# Patient Record
Sex: Female | Born: 1935 | Race: White | Hispanic: No | Marital: Single | Smoking: Former smoker
Health system: Southern US, Community
[De-identification: ages and names within clinical notes are randomized; demographics above are authoritative.]

## PROBLEM LIST (undated history)

## (undated) DIAGNOSIS — G2581 Restless legs syndrome: Secondary | ICD-10-CM

## (undated) DIAGNOSIS — E119 Type 2 diabetes mellitus without complications: Secondary | ICD-10-CM

## (undated) DIAGNOSIS — D509 Iron deficiency anemia, unspecified: Secondary | ICD-10-CM

## (undated) DIAGNOSIS — I10 Essential (primary) hypertension: Secondary | ICD-10-CM

## (undated) DIAGNOSIS — G4700 Insomnia, unspecified: Secondary | ICD-10-CM

## (undated) DIAGNOSIS — I509 Heart failure, unspecified: Secondary | ICD-10-CM

## (undated) DIAGNOSIS — K219 Gastro-esophageal reflux disease without esophagitis: Secondary | ICD-10-CM

## (undated) HISTORY — DX: Iron deficiency anemia, unspecified: D50.9

## (undated) HISTORY — DX: Insomnia, unspecified: G47.00

## (undated) HISTORY — DX: Heart failure, unspecified: I50.9

## (undated) HISTORY — PX: BLADDER REPAIR: SHX76

## (undated) HISTORY — DX: Restless legs syndrome: G25.81

---

## 2006-01-29 ENCOUNTER — Ambulatory Visit: Payer: Self-pay | Admitting: Internal Medicine

## 2007-02-11 ENCOUNTER — Ambulatory Visit: Payer: Self-pay | Admitting: Internal Medicine

## 2008-02-15 ENCOUNTER — Ambulatory Visit: Payer: Self-pay | Admitting: Internal Medicine

## 2009-02-20 ENCOUNTER — Ambulatory Visit: Payer: Self-pay | Admitting: Internal Medicine

## 2010-03-04 ENCOUNTER — Ambulatory Visit: Payer: Self-pay | Admitting: Internal Medicine

## 2011-09-15 ENCOUNTER — Ambulatory Visit: Payer: Self-pay | Admitting: Family Medicine

## 2014-06-23 ENCOUNTER — Emergency Department: Payer: Self-pay | Admitting: Emergency Medicine

## 2014-06-24 LAB — URINALYSIS, COMPLETE
Bacteria: NONE SEEN
Bilirubin,UR: NEGATIVE
Blood: NEGATIVE
Glucose,UR: NEGATIVE mg/dL (ref 0–75)
Ketone: NEGATIVE
Nitrite: NEGATIVE
Ph: 5 (ref 4.5–8.0)
Protein: 30
RBC,UR: 16 /HPF (ref 0–5)
Specific Gravity: 1.015 (ref 1.003–1.030)
Squamous Epithelial: 9
WBC UR: 150 /HPF (ref 0–5)

## 2014-06-26 LAB — URINE CULTURE

## 2015-02-16 ENCOUNTER — Encounter: Payer: Self-pay | Admitting: Emergency Medicine

## 2015-02-16 ENCOUNTER — Inpatient Hospital Stay
Admission: EM | Admit: 2015-02-16 | Discharge: 2015-02-17 | DRG: 812 | Disposition: A | Discharge status: Home / Self Care | Payer: Medicare PPO | Attending: Internal Medicine | Admitting: Internal Medicine

## 2015-02-16 DIAGNOSIS — D509 Iron deficiency anemia, unspecified: Secondary | ICD-10-CM | POA: Diagnosis present

## 2015-02-16 DIAGNOSIS — R0902 Hypoxemia: Secondary | ICD-10-CM | POA: Diagnosis present

## 2015-02-16 DIAGNOSIS — Z6830 Body mass index (BMI) 30.0-30.9, adult: Secondary | ICD-10-CM | POA: Diagnosis not present

## 2015-02-16 DIAGNOSIS — I10 Essential (primary) hypertension: Secondary | ICD-10-CM | POA: Diagnosis present

## 2015-02-16 DIAGNOSIS — Z885 Allergy status to narcotic agent status: Secondary | ICD-10-CM | POA: Diagnosis not present

## 2015-02-16 DIAGNOSIS — Z79899 Other long term (current) drug therapy: Secondary | ICD-10-CM | POA: Diagnosis not present

## 2015-02-16 DIAGNOSIS — Z888 Allergy status to other drugs, medicaments and biological substances status: Secondary | ICD-10-CM

## 2015-02-16 DIAGNOSIS — E669 Obesity, unspecified: Secondary | ICD-10-CM | POA: Diagnosis present

## 2015-02-16 DIAGNOSIS — D500 Iron deficiency anemia secondary to blood loss (chronic): Secondary | ICD-10-CM | POA: Diagnosis present

## 2015-02-16 DIAGNOSIS — D649 Anemia, unspecified: Secondary | ICD-10-CM

## 2015-02-16 DIAGNOSIS — M25552 Pain in left hip: Secondary | ICD-10-CM | POA: Diagnosis present

## 2015-02-16 DIAGNOSIS — E119 Type 2 diabetes mellitus without complications: Secondary | ICD-10-CM | POA: Diagnosis present

## 2015-02-16 DIAGNOSIS — R5383 Other fatigue: Secondary | ICD-10-CM | POA: Diagnosis present

## 2015-02-16 DIAGNOSIS — R000 Tachycardia, unspecified: Secondary | ICD-10-CM | POA: Diagnosis present

## 2015-02-16 DIAGNOSIS — M25551 Pain in right hip: Secondary | ICD-10-CM | POA: Diagnosis present

## 2015-02-16 HISTORY — DX: Type 2 diabetes mellitus without complications: E11.9

## 2015-02-16 HISTORY — DX: Essential (primary) hypertension: I10

## 2015-02-16 LAB — COMPREHENSIVE METABOLIC PANEL
ALT: 18 U/L (ref 14–54)
AST: 27 U/L (ref 15–41)
Albumin: 4 g/dL (ref 3.5–5.0)
Alkaline Phosphatase: 56 U/L (ref 38–126)
Anion gap: 11 (ref 5–15)
BUN: 19 mg/dL (ref 6–20)
CO2: 22 mmol/L (ref 22–32)
Calcium: 9.6 mg/dL (ref 8.9–10.3)
Chloride: 101 mmol/L (ref 101–111)
Creatinine, Ser: 1.24 mg/dL — ABNORMAL HIGH (ref 0.44–1.00)
GFR calc Af Amer: 45 mL/min — ABNORMAL LOW (ref >60)
GFR calc non Af Amer: 39 mL/min — ABNORMAL LOW (ref >60)
Glucose, Bld: 119 mg/dL — ABNORMAL HIGH (ref 65–99)
Potassium: 4.5 mmol/L (ref 3.5–5.1)
Sodium: 134 mmol/L — ABNORMAL LOW (ref 135–145)
Total Bilirubin: 0.3 mg/dL (ref 0.3–1.2)
Total Protein: 7.8 g/dL (ref 6.5–8.1)

## 2015-02-16 LAB — PROTIME-INR
INR: 1.02
Prothrombin Time: 13.6 seconds (ref 11.4–15.0)

## 2015-02-16 LAB — CBC
HCT: 22.2 % — ABNORMAL LOW (ref 35.0–47.0)
Hemoglobin: 6.5 g/dL — ABNORMAL LOW (ref 12.0–16.0)
MCH: 19.2 pg — ABNORMAL LOW (ref 26.0–34.0)
MCHC: 29.3 g/dL — ABNORMAL LOW (ref 32.0–36.0)
MCV: 65.4 fL — ABNORMAL LOW (ref 80.0–100.0)
Platelets: 309 10*3/uL (ref 150–440)
RBC: 3.39 MIL/uL — ABNORMAL LOW (ref 3.80–5.20)
RDW: 19.7 % — ABNORMAL HIGH (ref 11.5–14.5)
WBC: 8 10*3/uL (ref 3.6–11.0)

## 2015-02-16 LAB — ABO/RH: ABO/RH(D): A POS

## 2015-02-16 LAB — PREPARE RBC (CROSSMATCH)

## 2015-02-16 MED ORDER — MAGNESIUM OXIDE 400 (241.3 MG) MG PO TABS
400.0000 mg | ORAL_TABLET | Freq: Every day | ORAL | Status: DC
  Administered 2015-02-16 – 2015-02-17 (×2): 400 mg via ORAL
  Filled 2015-02-16 (×2): qty 1

## 2015-02-16 MED ORDER — VITAMIN E 180 MG (400 UNIT) PO CAPS
400.0000 [IU] | ORAL_CAPSULE | Freq: Every day | ORAL | Status: DC
  Administered 2015-02-16 – 2015-02-17 (×2): 400 [IU] via ORAL
  Filled 2015-02-16 (×2): qty 1

## 2015-02-16 MED ORDER — ONDANSETRON HCL 4 MG/2ML IJ SOLN: 4.0000 mg | Freq: Four times a day (QID) | INTRAMUSCULAR | Status: DC | PRN

## 2015-02-16 MED ORDER — SODIUM CHLORIDE 0.9 % IV SOLN
10.0000 mL/h | Freq: Once | INTRAVENOUS | Status: AC
  Administered 2015-02-16: 10 mL/h via INTRAVENOUS

## 2015-02-16 MED ORDER — SODIUM CHLORIDE 0.9 % IV SOLN
8.0000 mg/h | INTRAVENOUS | Status: DC
  Administered 2015-02-16 – 2015-02-17 (×2): 8 mg/h via INTRAVENOUS
  Filled 2015-02-16 (×2): qty 80

## 2015-02-16 MED ORDER — PRAVASTATIN SODIUM 10 MG PO TABS
10.0000 mg | ORAL_TABLET | Freq: Every day | ORAL | Status: DC
  Filled 2015-02-16: qty 1

## 2015-02-16 MED ORDER — PRAMIPEXOLE DIHYDROCHLORIDE 0.25 MG PO TABS
0.2500 mg | ORAL_TABLET | Freq: Three times a day (TID) | ORAL | Status: DC
  Administered 2015-02-16 – 2015-02-17 (×2): 0.25 mg via ORAL
  Filled 2015-02-16 (×2): qty 1

## 2015-02-16 MED ORDER — METFORMIN HCL 500 MG PO TABS: 1000.0000 mg | ORAL_TABLET | Freq: Two times a day (BID) | ORAL | Status: DC

## 2015-02-16 MED ORDER — ACETAMINOPHEN 325 MG PO TABS: 650.0000 mg | ORAL_TABLET | Freq: Four times a day (QID) | ORAL | Status: DC | PRN

## 2015-02-16 MED ORDER — PANTOPRAZOLE SODIUM 40 MG PO TBEC
DELAYED_RELEASE_TABLET | ORAL | Status: AC
  Filled 2015-02-16: qty 1

## 2015-02-16 MED ORDER — PANTOPRAZOLE SODIUM 40 MG PO TBEC: 40.0000 mg | DELAYED_RELEASE_TABLET | Freq: Every day | ORAL | Status: DC

## 2015-02-16 MED ORDER — ONDANSETRON HCL 4 MG PO TABS: 4.0000 mg | ORAL_TABLET | Freq: Four times a day (QID) | ORAL | Status: DC | PRN

## 2015-02-16 MED ORDER — GLIMEPIRIDE 2 MG PO TABS
2.0000 mg | ORAL_TABLET | Freq: Every day | ORAL | Status: DC
  Administered 2015-02-17: 2 mg via ORAL
  Filled 2015-02-16: qty 1

## 2015-02-16 MED ORDER — AMLODIPINE BESYLATE 5 MG PO TABS
ORAL_TABLET | ORAL | Status: AC
  Filled 2015-02-16: qty 2

## 2015-02-16 MED ORDER — ALUM & MAG HYDROXIDE-SIMETH 200-200-20 MG/5ML PO SUSP: 30.0000 mL | Freq: Four times a day (QID) | ORAL | Status: DC | PRN

## 2015-02-16 MED ORDER — ACETAMINOPHEN 650 MG RE SUPP: 650.0000 mg | Freq: Four times a day (QID) | RECTAL | Status: DC | PRN

## 2015-02-16 MED ORDER — AMLODIPINE BESYLATE 10 MG PO TABS
10.0000 mg | ORAL_TABLET | Freq: Every day | ORAL | Status: DC
  Administered 2015-02-16 – 2015-02-17 (×2): 10 mg via ORAL
  Filled 2015-02-16: qty 1

## 2015-02-16 NOTE — ED Provider Notes (Addendum)
[facility] Emergency Department Provider Note  ____________________________________________  Time seen: 5:30 PM  I have reviewed the triage vital signs and the nursing notes.   HISTORY  Chief Complaint Abnormal Lab    HPI Lisa Mcneil is a 81 y.o. female who was sent to the emergency department for evaluation of a low hemoglobin. Patient tells me this is obviously due to low iron which she only started taking one week ago. She does report fatigue but no chest pain no shortness of breath. She denies rectal bleeding, no vaginal bleeding. She reports her stool is black because she started taking iron and it was normal prior to that.     Past Medical History  Diagnosis Date  . Diabetes mellitus without complication   . Hypertension     There are no active problems to display for this patient.   Past Surgical History  Procedure Laterality Date  . Bladder repair      No current outpatient prescriptions on file.  Allergies Codeine; Demerol; Polysaccharide iron complex; and Naproxen  No family history on file.  Social History History  Substance Use Topics  . Smoking status: Never Smoker   . Smokeless tobacco: Not on file  . Alcohol Use: No    Review of Systems  Constitutional: Negative for fever. Eyes: Negative for visual changes. ENT: Negative for sore throat Cardiovascular: Negative for chest pain. Respiratory: Negative for shortness of breath. Gastrointestinal: Negative for abdominal pain, vomiting and diarrhea. Positive for dark stools Genitourinary: Negative for dysuria. Musculoskeletal: Negative for back pain. Skin: Positive for pallor Neurological: Negative for headaches or focal weakness Psychiatric: No anxiety  10-point ROS otherwise negative.  ____________________________________________   PHYSICAL EXAM:  VITAL SIGNS: ED Triage Vitals  Enc Vitals Group     BP 02/16/15 1551 146/51 mmHg     Pulse Rate 02/16/15  1551 103     Resp 02/16/15 1551 19     Temp 02/16/15 1551 98.5 F (36.9 C)     Temp Source 02/16/15 1551 Oral     SpO2 02/16/15 1551 91 %     Weight 02/16/15 1551 162 lb (73.483 kg)     Height 02/16/15 1551 5\' 1"  (1.549 m)     Head Cir --      Peak Flow --      Pain Score 02/16/15 1551 0     Pain Loc --      Pain Edu? --      Excl. in [place]? --      Constitutional: Alert and oriented. Well appearing and in no distress. Pleasant and interactive Eyes: Conjunctivae are pale ENT   Head: Normocephalic and atraumatic.   Mouth/Throat: Mucous membranes are moist. Cardiovascular: Mild tachycardia noted, regular rhythm. Normal and symmetric distal pulses are present in all extremities. No murmurs, rubs, or gallops. Respiratory: Normal respiratory effort without tachypnea nor retractions. Breath sounds are clear and equal bilaterally.  Gastrointestinal: Soft and non-tender in all quadrants. No distention. There is no CVA tenderness. Guaiac negative black stool Genitourinary: deferred Musculoskeletal: Nontender with normal range of motion in all extremities. No lower extremity tenderness nor edema. Neurologic:  Normal speech and language. No gross focal neurologic deficits are appreciated. Skin:  Skin is warm, dry and intact. No rash noted. Psychiatric: Mood and affect are normal. Patient exhibits appropriate insight and judgment.  ____________________________________________    LABS (pertinent positives/negatives)  Labs Reviewed  CBC - Abnormal; Notable for the following:    RBC 3.39 (*)  Hemoglobin 6.5 (*)    HCT 22.2 (*)    MCV 65.4 (*)    MCH 19.2 (*)    MCHC 29.3 (*)    RDW 19.7 (*)    All other components within normal limits  COMPREHENSIVE METABOLIC PANEL - Abnormal; Notable for the following:    Sodium 134 (*)    Glucose, Bld 119 (*)    Creatinine, Ser 1.24 (*)    GFR calc non Af Amer 39 (*)    GFR calc Af Amer 45 (*)    All other components within normal limits   PROTIME-INR  TYPE AND SCREEN  ABO/RH  PREPARE RBC (CROSSMATCH)    ____________________________________________   EKG  ED ECG REPORT I, Lavonia Drafts, the attending physician, personally viewed and interpreted this ECG.  Date: 02/16/2015 EKG Time: 6:45 PM Rate: 96 Rhythm: normal sinus rhythm QRS Axis: normal Intervals: normal ST/T Wave abnormalities: normal Conduction Disutrbances: none Narrative Interpretation: unremarkable   ____________________________________________    RADIOLOGY I have personally reviewed any xrays that were ordered on this patient: None  ____________________________________________   PROCEDURES  Procedure(s) performed: none  Critical Care performed: yes  CRITICAL CARE Performed by: Lavonia Drafts   Total critical care time: 30  Critical care time was exclusive of separately billable procedures and treating other patients.  Critical care was necessary to treat or prevent imminent or life-threatening deterioration.  Critical care was time spent personally by me on the following activities: development of treatment plan with patient and/or surrogate as well as nursing, discussions with consultants, evaluation of patient's response to treatment, examination of patient, obtaining history from patient or surrogate, ordering and performing treatments and interventions, ordering and review of laboratory studies, ordering and review of radiographic studies, pulse oximetry and re-evaluation of patient's condition.   ____________________________________________   INITIAL IMPRESSION / ASSESSMENT AND PLAN / ED COURSE  Pertinent labs & imaging results that were available during my care of the patient were reviewed by me and considered in my medical decision making (see chart for details).  Patient well-appearing sitting up in bed and is annoyed that she is in the emergency department because she knows that this is related to iron deficiency as  she does not eat meat. She has only been taking supplements for one week and intermittently at that. She has guaiac negative black stool which fits with this history of present illness. It appears that she has had gradually decreasing hemoglobin over the last 6 months which is consistent with iron deficiency.   Consent obtained for PRBCs  Patient is hypoxic and tachycardic and severely anemic. We will admit the patient for further workup and blood transfusion.  ____________________________________________   FINAL CLINICAL IMPRESSION(S) / ED DIAGNOSES  Final diagnoses:  Symptomatic anemia     Lavonia Drafts, MD 02/16/15 [id number]  Lavonia Drafts, MD 02/16/15 [id number]

## 2015-02-16 NOTE — ED Notes (Signed)
Sent in by her pmd with abnormal labs

## 2015-02-16 NOTE — ED Notes (Signed)
Pt reports sent here by PCP for low hemoglobin (5). Pt denies any hx of low hemoglobin or blood transfusion. Pt reports dark, tarry stools; reports takes iron pills. Denies any shortness of breath, fatigue, rectal bleeding. Pt told to come here for blood transfusion.

## 2015-02-16 NOTE — H&P (Signed)
[facility] NAME: Lisa Mcneil    MR#:  [id number]  DATE OF BIRTH:  [birth year]  DATE OF ADMISSION:  02/16/2015  PRIMARY CARE PHYSICIAN:Dr.Linthavong  REQUESTING/REFERRING PHYSICIAN: Corky Downs  CHIEF COMPLAINT: Low hemoglobin    Chief Complaint  Patient presents with  . Abnormal Lab    HISTORY OF PRESENT ILLNESS:  Lisa Mcneil  is a 81 y.o. female with a known history of hypertension, diabetes sent in from Dr. Barbette Or office secondary to low hemoglobin. Patient recently started on iron pills she started that a week ago. She had black stools and she thinks is due to iron pills. And the ever since he started on iron pills started to have black stool. Patient complains of fatigue, shortness of breath recently. Denies any chest pain. No syncope. Patient denies loss of weight or loss of appetite. Patient oxygenation drops to 88% on room air and quickly goes up to 94% on 2 L. Hemoglobin today is 6.5,. Says that she takes ibuprofen on and off recently secondary to bilateral hip pain.  PAST MEDICAL HISTORY:   Past Medical History  Diagnosis Date  . Diabetes mellitus without complication   . Hypertension     PAST SURGICAL HISTOIRY:   Past Surgical History  Procedure Laterality Date  . Bladder repair      SOCIAL HISTORY:   History  Substance Use Topics  . Smoking status: Never Smoker   . Smokeless tobacco: Not on file  . Alcohol Use: No    FAMILY HISTORY:  No family history on file.  DRUG ALLERGIES:   Allergies  Allergen Reactions  . Codeine   . Demerol [Meperidine]   . Polysaccharide Iron Complex Other (See Comments)    Caused constipation  . Naproxen Rash    Caused rash on mouth    REVIEW OF SYSTEMS:  CONSTITUTIONAL: No fever, fatigue or weakness.  EYES: No blurred or double vision.  EARS, NOSE, AND THROAT: No tinnitus or ear pain.  RESPIRATORY: No cough, shortness of breath, wheezing or  hemoptysis.  CARDIOVASCULAR: No chest pain, orthopnea, edema.  GASTROINTESTINAL: No nausea, vomiting, diarrhea or abdominal pain.  GENITOURINARY: No dysuria, hematuria.  ENDOCRINE: No polyuria, nocturia,  HEMATOLOGY: No anemia, easy bruising or bleeding SKIN: No rash or lesion. MUSCULOSKELETAL: No joint pain or arthritis.   NEUROLOGIC: No tingling, numbness, weakness.  PSYCHIATRY: No anxiety or depression.   MEDICATIONS AT HOME:   Prior to Admission medications   Medication Sig Start Date End Date Taking? Authorizing Provider  amLODipine (NORVASC) 10 MG tablet Take 10 mg by mouth daily.   Yes Historical Provider, MD  aspirin EC 81 MG tablet Take 81 mg by mouth daily.   Yes Historical Provider, MD  BIOTIN PO Take 40,000 mcg by mouth daily at 2 PM daily at 2 PM.   Yes Historical Provider, MD  Cholecalciferol (VITAMIN D3) 1000 UNITS CAPS Take 2,000 Units by mouth daily at 2 PM daily at 2 PM.   Yes Historical Provider, MD  glimepiride (AMARYL) 2 MG tablet Take 2 mg by mouth daily with breakfast.   Yes Historical Provider, MD  lisinopril-hydrochlorothiazide (PRINZIDE,ZESTORETIC) 10-12.5 MG per tablet Take 1 tablet by mouth daily.   Yes Historical Provider, MD  lovastatin (MEVACOR) 40 MG tablet Take 40 mg by mouth at bedtime.   Yes Historical Provider, MD  Magnesium 250 MG TABS Take 1 tablet by mouth daily at 2 PM daily at 2 PM.   Yes  Historical Provider, MD  metFORMIN (GLUCOPHAGE) 1000 MG tablet Take 1,000 mg by mouth 2 (two) times daily with a meal.   Yes Historical Provider, MD  Multiple Vitamin (MULTIVITAMIN) capsule Take 1 capsule by mouth daily.   Yes Historical Provider, MD  omeprazole (PRILOSEC) 20 MG capsule Take 20 mg by mouth daily.   Yes Historical Provider, MD  pramipexole (MIRAPEX) 0.25 MG tablet Take 0.25 mg by mouth 3 (three) times daily.   Yes Historical Provider, MD  vitamin E (VITAMIN E) 400 UNIT capsule Take 400 Units by mouth daily.   Yes Historical Provider, MD       VITAL SIGNS:  Blood pressure 146/51, pulse 103, temperature 98.5 F (36.9 C), temperature source Oral, resp. rate 19, height 5\' 1"  (1.549 m), weight 73.483 kg (162 lb), SpO2 91 %.  PHYSICAL EXAMINATION:  GENERAL:  81 y.o.-year-old patient lying in the bed with no acute distress.  EYES: Pupils equal, round, reactive to light and accommodation. No scleral icterus. Extraocular muscles intact.  HEENT: Head atraumatic, normocephalic. Oropharynx and nasopharynx clear.  NECK:  Supple, no jugular venous distention. No thyroid enlargement, no tenderness.  LUNGS: Normal breath sounds bilaterally, no wheezing, rales,rhonchi or crepitation. No use of accessory muscles of respiration.  CARDIOVASCULAR: S1, S2 normal. No murmurs, rubs, or gallops.  ABDOMEN: Soft, nontender, nondistended. Bowel sounds present. No organomegaly or mass.  EXTREMITIES: No pedal edema, cyanosis, or clubbing.  NEUROLOGIC: Cranial nerves II through XII are intact. Muscle strength 5/5 in all extremities. Sensation intact. Gait not checked.  PSYCHIATRIC: The patient is alert and oriented x 3.  SKIN: No obvious rash, lesion, or ulcer.   LABORATORY PANEL:   CBC  Recent Labs Lab 02/16/15 1554  WBC 8.0  HGB 6.5*  HCT 22.2*  PLT 309   ------------------------------------------------------------------------------------------------------------------  Chemistries   Recent Labs Lab 02/16/15 1554  NA 134*  K 4.5  CL 101  CO2 22  GLUCOSE 119*  BUN 19  CREATININE 1.24*  CALCIUM 9.6  AST 27  ALT 18  ALKPHOS 56  BILITOT 0.3   ------------------------------------------------------------------------------------------------------------------  Cardiac Enzymes No results for input(s): TROPONINI in the last 168 hours. ------------------------------------------------------------------------------------------------------------------  RADIOLOGY:  No results found.  EKG:   Orders placed or performed during the  hospital encounter of 02/16/15  . ED EKG  . ED EKG   EKG shows normal sinus rhythm 90 with 3 cm no ST T changes.  IMPRESSION AND PLAN:   1. Severe symptomatic anemia possibly due to slow GI bleed: Admitted to hospitalist service, transfuse 2 units of  Obesity, check post transfusion CBC. Avoid NSAIDs, , aspirin. GI consult for possible EGD and colonoscopy. Continue Protonix infusion. Hypertension continue home medications Diabetes mellitus type type II continue metformin. RLS: Continue Mirapex. All the records are reviewed and case discussed with ED provider. Management plans discussed with the patient, family and they are in agreement.  CODE STATUS: full  TOTAL TIME TAKING CARE OF THIS PATIENT: 22minutes.    Epifanio Lesches M.D on 02/16/2015 at 7:44 PM  Between 7am to 6pm - Pager - [phone]  After 6pm go to [email or web address] - password [email or web address] [facility]  Office  [phone]  CC: Primary care physician; Pcp Not In System

## 2015-02-17 LAB — IRON AND TIBC
Iron: 11 ug/dL — ABNORMAL LOW (ref 28–170)
Saturation Ratios: 2 % — ABNORMAL LOW (ref 10.4–31.8)
TIBC: 515 ug/dL — ABNORMAL HIGH (ref 250–450)
UIBC: 504 ug/dL

## 2015-02-17 LAB — BASIC METABOLIC PANEL
Anion gap: 8 (ref 5–15)
BUN: 16 mg/dL (ref 6–20)
CO2: 25 mmol/L (ref 22–32)
Calcium: 9.4 mg/dL (ref 8.9–10.3)
Chloride: 102 mmol/L (ref 101–111)
Creatinine, Ser: 0.91 mg/dL (ref 0.44–1.00)
GFR calc Af Amer: >60 mL/min (ref >60)
GFR calc non Af Amer: 56 mL/min — ABNORMAL LOW (ref >60)
Glucose, Bld: 178 mg/dL — ABNORMAL HIGH (ref 65–99)
Potassium: 4.5 mmol/L (ref 3.5–5.1)
Sodium: 135 mmol/L (ref 135–145)

## 2015-02-17 LAB — CBC
HCT: 24 % — ABNORMAL LOW (ref 35.0–47.0)
Hemoglobin: 7.3 g/dL — ABNORMAL LOW (ref 12.0–16.0)
MCH: 20.6 pg — ABNORMAL LOW (ref 26.0–34.0)
MCHC: 30.6 g/dL — ABNORMAL LOW (ref 32.0–36.0)
MCV: 67.3 fL — ABNORMAL LOW (ref 80.0–100.0)
Platelets: 273 10*3/uL (ref 150–440)
RBC: 3.57 MIL/uL — ABNORMAL LOW (ref 3.80–5.20)
RDW: 22 % — ABNORMAL HIGH (ref 11.5–14.5)
WBC: 10 10*3/uL (ref 3.6–11.0)

## 2015-02-17 LAB — FERRITIN: Ferritin: 5 ng/mL — ABNORMAL LOW (ref 11–307)

## 2015-02-17 LAB — HEMOGLOBIN: Hemoglobin: 7.6 g/dL — ABNORMAL LOW (ref 12.0–16.0)

## 2015-02-17 NOTE — Progress Notes (Signed)
caled Dr Anselm Jungling and questioned if he wanted metformin held until he made rounds and decided on any test to be done , he responed to hold this dose of metformin

## 2015-02-17 NOTE — Consult Note (Signed)
GI Inpatient Consult Note  Reason for Consult: IDA   Attending Requesting Consult:  Vaichanie  History of Present Illness: Lisa Mcneil is a 81 y.o. female with past medical notable for diabetes, hypertension who is admitted for evaluation of symptomatic anemia. She has been having increasing fatigue and shortness of breath over the past week. Her hemoglobin checked by her PCP which was 6.5 and she was referred to the emergency room for blood transfusion. GI is consulted for evaluation of iron deficiency anemia. This Nydegger reports that she has previously had low blood counts and has been on iron. She had a hard time tolerating iron due to constipation other GI side effects and had recently stopped it. Her stools get dark when she is taking the iron but otherwise her stools returned to brown when she stops taking the iron. He is not had any black stools. She has not had any bright red blood or maroon-colored stool. She has never had EGD or colonoscopy. There is no family history of GI malignancy.   She denies weight loss dysphagia, abdominal pain. Here in the hospital she has received 1 unit of packed red blood cells and is feeling improved in terms of her fatigue and shortness of breath. Takes a baby aspirin but otherwise no blood thinners.  Past Medical History:  Past Medical History  Diagnosis Date  . Diabetes mellitus without complication   . Hypertension     Problem List: Patient Active Problem List   Diagnosis Date Noted  . Symptomatic anemia 02/16/2015    Past Surgical History: Past Surgical History  Procedure Laterality Date  . Bladder repair      Allergies: Allergies  Allergen Reactions  . Codeine   . Demerol [Meperidine]   . Polysaccharide Iron Complex Other (See Comments)    Caused constipation  . Naproxen Rash    Caused rash on mouth    Home Medications: Prescriptions prior to admission  Medication Sig Dispense Refill Last Dose  . amLODipine (NORVASC) 10  MG tablet Take 10 mg by mouth daily.   02/16/2015 at Unknown time  . aspirin EC 81 MG tablet Take 81 mg by mouth daily.   02/16/2015 at Unknown time  . BIOTIN PO Take 40,000 mcg by mouth daily at 2 PM daily at 2 PM.   02/16/2015 at Unknown time  . Cholecalciferol (VITAMIN D3) 1000 UNITS CAPS Take 2,000 Units by mouth daily at 2 PM daily at 2 PM.   02/16/2015 at Unknown time  . glimepiride (AMARYL) 2 MG tablet Take 2 mg by mouth daily with breakfast.   02/16/2015 at Unknown time  . lisinopril-hydrochlorothiazide (PRINZIDE,ZESTORETIC) 10-12.5 MG per tablet Take 1 tablet by mouth daily.   02/16/2015 at Unknown time  . lovastatin (MEVACOR) 40 MG tablet Take 40 mg by mouth at bedtime.   02/15/2015 at Unknown time  . Magnesium 250 MG TABS Take 1 tablet by mouth daily at 2 PM daily at 2 PM.   02/16/2015 at Unknown time  . metFORMIN (GLUCOPHAGE) 1000 MG tablet Take 1,000 mg by mouth 2 (two) times daily with a meal.   02/16/2015 at Unknown time  . Multiple Vitamin (MULTIVITAMIN) capsule Take 1 capsule by mouth daily.   02/16/2015 at Unknown time  . omeprazole (PRILOSEC) 20 MG capsule Take 20 mg by mouth daily.   02/16/2015 at Unknown time  . pramipexole (MIRAPEX) 0.25 MG tablet Take 0.25 mg by mouth 3 (three) times daily.   02/16/2015 at Unknown time  .  vitamin E (VITAMIN E) 400 UNIT capsule Take 400 Units by mouth daily.   02/16/2015 at Unknown time   Home medication reconciliation was completed with the patient.   Scheduled Inpatient Medications:   . amLODipine  10 mg Oral Daily  . glimepiride  2 mg Oral Q breakfast  . magnesium oxide  400 mg Oral Q1400  . metFORMIN  1,000 mg Oral BID WC  . pramipexole  0.25 mg Oral TID  . pravastatin  10 mg Oral q1800  . vitamin E  400 Units Oral Daily    Continuous Inpatient Infusions:     PRN Inpatient Medications:  acetaminophen **OR** acetaminophen, alum & mag hydroxide-simeth, ondansetron **OR** ondansetron (ZOFRAN) IV  Family History:  The patient's family history is  negative for inflammatory bowel disorders, GI malignancy, or solid organ transplantation.  Social History:   reports that she has never smoked. She does not have any smokeless tobacco history on file. She reports that she does not drink alcohol or use illicit drugs.   Review of Systems: Constitutional: Weight is stable.  Eyes: No changes in vision. ENT: No oral lesions, sore throat.  GI: see HPI.  Heme/Lymph: + easy bruising CV: No chest pain.  GU: No hematuria.  Integumentary: No rashes.  Neuro: No headaches.  Psych: No depression/anxiety.  Endocrine: No heat/cold intolerance.  Allergic/Immunologic: No urticaria.  Resp: No cough, +_ SOB Musculoskeletal: No joint swelling.    Physical Examination: BP 148/46 mmHg  Pulse 96  Temp(Src) 98.3 F (36.8 C) (Oral)  Resp 19  Ht 5\' 1"  (1.549 m)  Wt 73.074 kg (161 lb 1.6 oz)  BMI 30.46 kg/m2  SpO2 93% Gen: NAD, alert and oriented x 4 HEENT: PEERLA, EOMI, Neck: supple, no JVD or thyromegaly Chest: CTA bilaterally, no wheezes, crackles, or other adventitious sounds CV: RRR, no m/g/c/r Abd: soft, NT, ND, +BS in all four quadrants; no HSM, guarding, ridigity, or rebound tenderness Ext: no edema, well perfused with 2+ pulses, Skin: no rash or lesions noted Lymph: no LAD  Data: Lab Results  Component Value Date   WBC 10.0 02/17/2015   HGB 7.6* 02/17/2015   HCT 24.0* 02/17/2015   MCV 67.3* 02/17/2015   PLT 273 02/17/2015    Recent Labs Lab 02/16/15 1554 02/17/15 0418 02/17/15 1548  HGB 6.5* 7.3* 7.6*   Lab Results  Component Value Date   NA 135 02/17/2015   K 4.5 02/17/2015   CL 102 02/17/2015   CO2 25 02/17/2015   BUN 16 02/17/2015   CREATININE 0.91 02/17/2015   Lab Results  Component Value Date   ALT 18 02/16/2015   AST 27 02/16/2015   ALKPHOS 56 02/16/2015   BILITOT 0.3 02/16/2015    Recent Labs Lab 02/16/15 1554  INR 1.02   Assessment/Plan: Lisa Mcneil is a 81 y.o. female with iron deficiency  anemia, symptomatic. She has not had any overt bleeding. She does develop darker stools on iron as will be expected. He has not yet had endoscopic workup.  She would prefer to have this done as an outpatient. Since she has not had any overt bleeding, once we document stability posttransfusion I would be okay with discharge. I will plan to have her follow-up in the clinic this week to set her up for EGD and colonoscopy to investigate the cause of the iron deficiency anemia. She can continue holding her iron for now since she will need to hold it for colonoscopy. Since she is has trouble tolerating iron,  she may need hematology consult for IV iron.   Thank you for the consult. Please call with questions or concerns.  REIN, Grace Blight, MD

## 2015-02-17 NOTE — Progress Notes (Signed)
[facility] [place] NAME: Lisa Mcneil    MR#:  [id number]  DATE OF BIRTH:  1928/03/07  SUBJECTIVE:  CHIEF COMPLAINT:   Chief Complaint  Patient presents with  . Abnormal Lab  Hb was low- received One unit transfusion - feels better now.  REVIEW OF SYSTEMS:   CONSTITUTIONAL: No fever, fatigue or weakness.  EYES: No blurred or double vision.  EARS, NOSE, AND THROAT: No tinnitus or ear pain.  RESPIRATORY: No cough, shortness of breath, wheezing or hemoptysis.  CARDIOVASCULAR: No chest pain, orthopnea, edema.  GASTROINTESTINAL: No nausea, vomiting, diarrhea or abdominal pain.  GENITOURINARY: No dysuria, hematuria.  ENDOCRINE: No polyuria, nocturia,  HEMATOLOGY: No anemia, easy bruising or bleeding SKIN: No rash or lesion. MUSCULOSKELETAL: No joint pain or arthritis.   NEUROLOGIC: No tingling, numbness, weakness.  PSYCHIATRY: No anxiety or depression.   ROS  DRUG ALLERGIES:   Allergies  Allergen Reactions  . Codeine   . Demerol [Meperidine]   . Polysaccharide Iron Complex Other (See Comments)    Caused constipation  . Naproxen Rash    Caused rash on mouth    VITALS:  Blood pressure 148/46, pulse 96, temperature 98.3 F (36.8 C), temperature source Oral, resp. rate 19, height 5\' 1"  (1.549 m), weight 73.074 kg (161 lb 1.6 oz), SpO2 93 %.  PHYSICAL EXAMINATION:  GENERAL:  81 y.o.-year-old patient lying in the bed with no acute distress.  EYES: Pupils equal, round, reactive to light and accommodation. No scleral icterus. Extraocular muscles intact. Conjunctivae pale. HEENT: Head atraumatic, normocephalic. Oropharynx and nasopharynx clear.  NECK:  Supple, no jugular venous distention. No thyroid enlargement, no tenderness.  LUNGS: Normal breath sounds bilaterally, no wheezing, rales,rhonchi or crepitation. No use of accessory muscles of respiration.  CARDIOVASCULAR: S1, S2 normal. No murmurs, rubs, or gallops.   ABDOMEN: Soft, nontender, nondistended. Bowel sounds present. No organomegaly or mass.  EXTREMITIES: No pedal edema, cyanosis, or clubbing.  NEUROLOGIC: Cranial nerves II through XII are intact. Muscle strength 5/5 in all extremities. Sensation intact. Gait not checked.  PSYCHIATRIC: The patient is alert and oriented x 3.  SKIN: No obvious rash, lesion, or ulcer.   Physical Exam LABORATORY PANEL:   CBC  Recent Labs Lab 02/17/15 0418  WBC 10.0  HGB 7.3*  HCT 24.0*  PLT 273   ------------------------------------------------------------------------------------------------------------------  Chemistries   Recent Labs Lab 02/16/15 1554 02/17/15 0418  NA 134* 135  K 4.5 4.5  CL 101 102  CO2 22 25  GLUCOSE 119* 178*  BUN 19 16  CREATININE 1.24* 0.91  CALCIUM 9.6 9.4  AST 27  --   ALT 18  --   ALKPHOS 56  --   BILITOT 0.3  --    ------------------------------------------------------------------------------------------------------------------  Cardiac Enzymes No results for input(s): TROPONINI in the last 168 hours. ------------------------------------------------------------------------------------------------------------------  RADIOLOGY:  No results found.  ASSESSMENT AND PLAN:   * Severe symptomatic anemia possibly due to slow GI bleed:    S/p One unit PRBC- follow GI.   Hb > 7 now transfusion CBC. Avoid NSAIDs, , aspirin. GI consult for possible EGD and colonoscopy. Continue Protonix infusion. * Hypertension continue home medications * Diabetes mellitus type type II continue metformin. * RLS: Continue Mirapex.  All the records are reviewed and case discussed with Care Management/Social Workerr. Management plans discussed with the patient, family and they are in agreement.  CODE STATUS: full  TOTAL TIME TAKING CARE OF THIS PATIENT: 35 minutes.  More than 50% of the visit was spent in counseling/coordination of care  POSSIBLE D/C IN 1-2 DAYS, DEPENDING  ON CLINICAL CONDITION.   Vaughan Basta M.D on 02/17/2015   Between 7am to 6pm - Pager - [phone]  After 6pm go to [email or web address] - password [email or web address] [facility]  Office  [phone]  CC: Primary care physician; Pcp Not In System

## 2015-02-17 NOTE — Progress Notes (Signed)
Repeat hgb 7.6 pt discharged via wheelchair care of family , pt to keep appt with Dr Doy Mince on Tuesday

## 2015-02-17 NOTE — Discharge Summary (Signed)
[facility] [place] NAME: Lisa Mcneil    MR#:  [id number]  DATE OF BIRTH:  Jun 12, 1928  DATE OF ADMISSION:  02/16/2015 ADMITTING PHYSICIAN: Epifanio Lesches, MD  DATE OF DISCHARGE: 02/17/2015  PRIMARY CARE PHYSICIAN: Dr. Netty Starring.    ADMISSION DIAGNOSIS:  Symptomatic anemia [D64.9]  DISCHARGE DIAGNOSIS:  Active Problems:   Symptomatic anemia   SECONDARY DIAGNOSIS:   Past Medical History  Diagnosis Date  . Diabetes mellitus without complication   . Hypertension     HOSPITAL COURSE:   * Severe symptomatic anemia possibly due to slow GI bleed:   S/p One unit PRBC- follow GI.  Hb > 7 now transfusion CBC. Avoid NSAIDs, , aspirin. GI consult for possible EGD and colonoscopy. Continue Protonix infusion.   Seen by GI- Hb stable, so suggested to do procedure as out pt.    discharge home today. * Hypertension continue home medications. * Diabetes mellitus type type II continue metformin. * RLS: Continue Mirapex.  DISCHARGE CONDITIONS:   Stable.  CONSULTS OBTAINED:  Treatment Team:  Josefine Class, MD  DRUG ALLERGIES:   Allergies  Allergen Reactions  . Codeine   . Demerol [Meperidine]   . Polysaccharide Iron Complex Other (See Comments)    Caused constipation  . Naproxen Rash    Caused rash on mouth    DISCHARGE MEDICATIONS:   Current Discharge Medication List    CONTINUE these medications which have NOT CHANGED   Details  amLODipine (NORVASC) 10 MG tablet Take 10 mg by mouth daily.    BIOTIN PO Take 40,000 mcg by mouth daily at 2 PM daily at 2 PM.    Cholecalciferol (VITAMIN D3) 1000 UNITS CAPS Take 2,000 Units by mouth daily at 2 PM daily at 2 PM.    glimepiride (AMARYL) 2 MG tablet Take 2 mg by mouth daily with breakfast.    lisinopril-hydrochlorothiazide (PRINZIDE,ZESTORETIC) 10-12.5 MG per tablet Take 1 tablet by mouth daily.    lovastatin (MEVACOR) 40 MG tablet Take 40 mg by  mouth at bedtime.    Magnesium 250 MG TABS Take 1 tablet by mouth daily at 2 PM daily at 2 PM.    metFORMIN (GLUCOPHAGE) 1000 MG tablet Take 1,000 mg by mouth 2 (two) times daily with a meal.    Multiple Vitamin (MULTIVITAMIN) capsule Take 1 capsule by mouth daily.    omeprazole (PRILOSEC) 20 MG capsule Take 20 mg by mouth daily.    pramipexole (MIRAPEX) 0.25 MG tablet Take 0.25 mg by mouth 3 (three) times daily.    vitamin E (VITAMIN E) 400 UNIT capsule Take 400 Units by mouth daily.      STOP taking these medications     aspirin EC 81 MG tablet          DISCHARGE INSTRUCTIONS:   Follow with GI clinic in 1 week.  If you experience worsening of your admission symptoms, develop shortness of breath, life threatening emergency, suicidal or homicidal thoughts you must seek medical attention immediately by calling 911 or calling your MD immediately  if symptoms less severe.  You Must read complete instructions/literature along with all the possible adverse reactions/side effects for all the Medicines you take and that have been prescribed to you. Take any new Medicines after you have completely understood and accept all the possible adverse reactions/side effects.   Please note  You were cared for by a hospitalist during your hospital stay. If you have any questions about  your discharge medications or the care you received while you were in the hospital after you are discharged, you can call the unit and asked to speak with the hospitalist on call if the hospitalist that took care of you is not available. Once you are discharged, your primary care physician will handle any further medical issues. Please note that NO REFILLS for any discharge medications will be authorized once you are discharged, as it is imperative that you return to your primary care physician (or establish a relationship with a primary care physician if you do not have one) for your aftercare needs so that they can  reassess your need for medications and monitor your lab values.    Today   CHIEF COMPLAINT:   Chief Complaint  Patient presents with  . Abnormal Lab    HISTORY OF PRESENT ILLNESS:  Lisa Mcneil  is a 81 y.o. female with a known history of hypertension, diabetes sent in from Dr. Barbette Or office secondary to low hemoglobin. Patient recently started on iron pills she started that a week ago. She had black stools and she thinks is due to iron pills. And the ever since he started on iron pills started to have black stool. Patient complains of fatigue, shortness of breath recently. Denies any chest pain. No syncope. Patient denies loss of weight or loss of appetite. Patient oxygenation drops to 88% on room air and quickly goes up to 94% on 2 L. Hemoglobin today is 6.5,. Says that she takes ibuprofen on and off recently secondary to bilateral hip pain.    VITAL SIGNS:  Blood pressure 148/46, pulse 96, temperature 98.3 F (36.8 C), temperature source Oral, resp. rate 19, height 5\' 1"  (1.549 m), weight 73.074 kg (161 lb 1.6 oz), SpO2 93 %.  I/O:   Intake/Output Summary (Last 24 hours) at 02/17/15 1537 Last data filed at 02/17/15 1200  Gross per 24 hour  Intake 1083.33 ml  Output      0 ml  Net 1083.33 ml    PHYSICAL EXAMINATION:   GENERAL: 81 y.o.-year-old patient lying in the bed with no acute distress.  EYES: Pupils equal, round, reactive to light and accommodation. No scleral icterus. Extraocular muscles intact. Conjunctivae pale. HEENT: Head atraumatic, normocephalic. Oropharynx and nasopharynx clear.  NECK: Supple, no jugular venous distention. No thyroid enlargement, no tenderness.  LUNGS: Normal breath sounds bilaterally, no wheezing, rales,rhonchi or crepitation. No use of accessory muscles of respiration.  CARDIOVASCULAR: S1, S2 normal. No murmurs, rubs, or gallops.  ABDOMEN: Soft, nontender, nondistended. Bowel sounds present. No organomegaly or mass.   EXTREMITIES: No pedal edema, cyanosis, or clubbing.  NEUROLOGIC: Cranial nerves II through XII are intact. Muscle strength 5/5 in all extremities. Sensation intact. Gait not checked.  PSYCHIATRIC: The patient is alert and oriented x 3.  SKIN: No obvious rash, lesion, or ulcer.   DATA REVIEW:   CBC  Recent Labs Lab 02/17/15 0418  WBC 10.0  HGB 7.3*  HCT 24.0*  PLT 273    Chemistries   Recent Labs Lab 02/16/15 1554 02/17/15 0418  NA 134* 135  K 4.5 4.5  CL 101 102  CO2 22 25  GLUCOSE 119* 178*  BUN 19 16  CREATININE 1.24* 0.91  CALCIUM 9.6 9.4  AST 27  --   ALT 18  --   ALKPHOS 56  --   BILITOT 0.3  --     Cardiac Enzymes No results for input(s): TROPONINI in the last 168 hours.  Microbiology  Results  Results for orders placed or performed in visit on 06/23/14  Urine culture     Status: None   Collection Time: 06/23/14 11:20 PM  Result Value Ref Range Status   Micro Text Report   Final       SOURCE: CLEAN CATCH    ORGANISM 1                50,000 CFU/ML Enterococcus faecalis   ANTIBIOTIC                    ORG#1     AMPICILLIN                    S         CIPROFLOXACIN                 S         LEVOFLOXACIN                  S         LINEZOLID                     S         NITROFURANTOIN                S         TETRACYCLINE                  R             RADIOLOGY:  No results found.   Management plans discussed with the patient, family and they are in agreement.  CODE STATUS:     Code Status Orders        Start     Ordered   02/16/15 1935  Full code   Continuous     02/16/15 1939    Advance Directive Documentation        Most Recent Value   Type of Advance Directive  Living will   Pre-existing out of facility DNR order (yellow form or pink MOST form)     "MOST" Form in Place?        TOTAL TIME TAKING CARE OF THIS PATIENT: 40 minutes.    Vaughan Basta M.D on 02/17/2015 at 3:37 PM  Between 7am to 6pm - Pager -  [phone]  After 6pm go to [email or web address] - password [email or web address] [facility]  Office  [phone]  CC: Primary care physician; Pcp Not In System

## 2015-02-17 NOTE — Progress Notes (Signed)
sw Dr Anselm Jungling  Concern pt desire for discharge , Dr Mearl Latin kpt would d/c home if hgb was above 7 , he was putting in discharge orders , order to d/c protonix drip and telemetry obtained  Lab here to draw labs .

## 2015-02-17 NOTE — Outcomes Assessment (Signed)
Patient is A+O with no signs of distress. SPO2 runs between 88-91% on 4 L o2.  1 unit of blood infused.  Patient up to BR with assist. Denies pain.

## 2015-02-18 LAB — TYPE AND SCREEN
ABO/RH(D): A POS
Antibody Screen: NEGATIVE
Unit division: 0

## 2015-02-26 ENCOUNTER — Inpatient Hospital Stay: Payer: Medicare PPO | Attending: Oncology | Admitting: Oncology

## 2015-02-26 ENCOUNTER — Encounter: Payer: Self-pay | Admitting: Oncology

## 2015-02-26 VITALS — BP 136/76 | HR 99 | Temp 98.5°F | Resp 16 | Ht 63.39 in | Wt 159.8 lb

## 2015-02-26 DIAGNOSIS — G2581 Restless legs syndrome: Secondary | ICD-10-CM | POA: Diagnosis not present

## 2015-02-26 DIAGNOSIS — I10 Essential (primary) hypertension: Secondary | ICD-10-CM | POA: Insufficient documentation

## 2015-02-26 DIAGNOSIS — D509 Iron deficiency anemia, unspecified: Secondary | ICD-10-CM

## 2015-02-26 DIAGNOSIS — E119 Type 2 diabetes mellitus without complications: Secondary | ICD-10-CM | POA: Diagnosis not present

## 2015-02-26 DIAGNOSIS — Z87891 Personal history of nicotine dependence: Secondary | ICD-10-CM | POA: Diagnosis not present

## 2015-02-26 DIAGNOSIS — Z79899 Other long term (current) drug therapy: Secondary | ICD-10-CM | POA: Insufficient documentation

## 2015-02-26 LAB — CBC
HCT: 30 % — ABNORMAL LOW (ref 35.0–47.0)
Hemoglobin: 8.7 g/dL — ABNORMAL LOW (ref 12.0–16.0)
MCH: 19.8 pg — ABNORMAL LOW (ref 26.0–34.0)
MCHC: 28.8 g/dL — ABNORMAL LOW (ref 32.0–36.0)
MCV: 68.6 fL — ABNORMAL LOW (ref 80.0–100.0)
Platelets: 294 10*3/uL (ref 150–440)
RBC: 4.38 MIL/uL (ref 3.80–5.20)
RDW: 22.8 % — ABNORMAL HIGH (ref 11.5–14.5)
WBC: 6.8 10*3/uL (ref 3.6–11.0)

## 2015-02-26 LAB — FOLATE: Folate: 59.1 ng/mL (ref >5.9)

## 2015-02-26 LAB — IRON AND TIBC
Iron: 16 ug/dL — ABNORMAL LOW (ref 28–170)
Saturation Ratios: 3 % — ABNORMAL LOW (ref 10.4–31.8)
TIBC: 500 ug/dL — ABNORMAL HIGH (ref 250–450)
UIBC: 484 ug/dL

## 2015-02-26 LAB — VITAMIN B12: Vitamin B-12: 371 pg/mL (ref 180–914)

## 2015-02-26 LAB — LACTATE DEHYDROGENASE: LDH: 140 U/L (ref 98–192)

## 2015-02-26 LAB — DAT, POLYSPECIFIC AHG (ARMC ONLY): Polyspecific AHG test: NEGATIVE

## 2015-02-26 LAB — FERRITIN: Ferritin: 5 ng/mL — ABNORMAL LOW (ref 11–307)

## 2015-02-26 NOTE — Progress Notes (Signed)
Patient is here for anemia work-up with history of recent blood transfusion.

## 2015-02-27 LAB — HAPTOGLOBIN: Haptoglobin: 190 mg/dL (ref 34–200)

## 2015-02-27 LAB — ERYTHROPOIETIN: Erythropoietin: 84.1 m[IU]/mL — ABNORMAL HIGH (ref 2.6–18.5)

## 2015-02-28 ENCOUNTER — Inpatient Hospital Stay: Payer: Medicare PPO

## 2015-02-28 VITALS — BP 130/76 | HR 79 | Temp 98.4°F

## 2015-02-28 DIAGNOSIS — D509 Iron deficiency anemia, unspecified: Secondary | ICD-10-CM | POA: Diagnosis not present

## 2015-02-28 DIAGNOSIS — D649 Anemia, unspecified: Secondary | ICD-10-CM

## 2015-02-28 MED ORDER — SODIUM CHLORIDE 0.9 % IV SOLN
510.0000 mg | Freq: Once | INTRAVENOUS | Status: AC
  Administered 2015-02-28: 510 mg via INTRAVENOUS
  Filled 2015-02-28: qty 17

## 2015-02-28 MED ORDER — SODIUM CHLORIDE 0.9 % IV SOLN
Freq: Once | INTRAVENOUS | Status: AC
  Administered 2015-02-28: 12:00:00 via INTRAVENOUS
  Filled 2015-02-28: qty 1000

## 2015-03-07 ENCOUNTER — Inpatient Hospital Stay: Payer: Medicare PPO

## 2015-03-07 VITALS — BP 137/73 | HR 87 | Temp 97.6°F | Resp 16

## 2015-03-07 DIAGNOSIS — D509 Iron deficiency anemia, unspecified: Secondary | ICD-10-CM | POA: Diagnosis not present

## 2015-03-07 DIAGNOSIS — D649 Anemia, unspecified: Secondary | ICD-10-CM

## 2015-03-07 MED ORDER — SODIUM CHLORIDE 0.9 % IV SOLN
Freq: Once | INTRAVENOUS | Status: AC
  Administered 2015-03-07: 09:00:00 via INTRAVENOUS
  Filled 2015-03-07: qty 1000

## 2015-03-07 MED ORDER — SODIUM CHLORIDE 0.9 % IV SOLN
510.0000 mg | Freq: Once | INTRAVENOUS | Status: AC
  Administered 2015-03-07: 510 mg via INTRAVENOUS
  Filled 2015-03-07: qty 17

## 2015-03-10 NOTE — Progress Notes (Signed)
[facility]  Telephone:(336) [phone] Fax:(336) [phone]  ID: Lisa Mcneil OB: Mar 22, 1928  MR#: [id number]  [id number]  Patient Care Team: Pcp Not In System as PCP - General  CHIEF COMPLAINT:  Chief Complaint  Patient presents with  . New Evaluation    anemia    INTERVAL HISTORY: Patient is an 81 year old female who was recently admitted to the hospital with symptomatic anemia requiring blood transfusion. Currently, she feels improved but still complains of increased weakness and fatigue. She has no neurologic complaints. She denies any recent fevers. She has a good appetite and denies weight loss. She has no chest pain or shortness of breath. She denies any nausea, vomiting, consultation, or diarrhea. She has no melanotic or hematochezia. Patient otherwise feels well and offers no further specific complaints.  REVIEW OF SYSTEMS:   Review of Systems  Constitutional: Positive for malaise/fatigue.  Respiratory: Negative.   Gastrointestinal: Negative for blood in stool and melena.  Neurological: Positive for weakness.    As per HPI. Otherwise, a complete review of systems is negatve.  PAST MEDICAL HISTORY: Past Medical History  Diagnosis Date  . Diabetes mellitus without complication   . Hypertension   . RLS (restless legs syndrome)   . Insomnia   . Iron deficiency anemia     PAST SURGICAL HISTORY: Past Surgical History  Procedure Laterality Date  . Bladder repair      FAMILY HISTORY: Reviewed and unchanged. No reported history of malignancy or chronic disease.     ADVANCED DIRECTIVES:    HEALTH MAINTENANCE: History  Substance Use Topics  . Smoking status: Former Research scientist (life sciences)  . Smokeless tobacco: Never Used  . Alcohol Use: No     Colonoscopy:  PAP:  Bone density:  Lipid panel:  Allergies  Allergen Reactions  . Codeine   . Demerol [Meperidine]   . Polysaccharide Iron Complex Other (See Comments)    Caused constipation  .  Naproxen Rash    Caused rash on mouth    Current Outpatient Prescriptions  Medication Sig Dispense Refill  . amLODipine (NORVASC) 10 MG tablet Take 10 mg by mouth daily.    Marland Kitchen BIOTIN PO Take 40,000 mcg by mouth daily at 2 PM daily at 2 PM.    . Cholecalciferol (VITAMIN D3) 1000 UNITS CAPS Take 2,000 Units by mouth daily at 2 PM daily at 2 PM.    . glimepiride (AMARYL) 2 MG tablet Take 2 mg by mouth daily with breakfast.    . lisinopril-hydrochlorothiazide (PRINZIDE,ZESTORETIC) 10-12.5 MG per tablet Take 1 tablet by mouth daily.    Marland Kitchen lovastatin (MEVACOR) 40 MG tablet Take 40 mg by mouth at bedtime.    . Magnesium 250 MG TABS Take 1 tablet by mouth daily at 2 PM daily at 2 PM.    . metFORMIN (GLUCOPHAGE) 1000 MG tablet Take 1,000 mg by mouth 2 (two) times daily with a meal.    . Multiple Vitamin (MULTIVITAMIN) capsule Take 1 capsule by mouth daily.    Marland Kitchen omeprazole (PRILOSEC) 20 MG capsule Take 20 mg by mouth daily.    . pramipexole (MIRAPEX) 0.25 MG tablet Take 0.25 mg by mouth 3 (three) times daily.    . vitamin E (VITAMIN E) 400 UNIT capsule Take 400 Units by mouth daily.     No current facility-administered medications for this visit.    OBJECTIVE: Filed Vitals:   02/26/15 1128  BP: 136/76  Pulse: 99  Temp: 98.5 F (36.9 C)  Resp: 16  Body mass index is 27.97 kg/(m^2).    ECOG FS:1 - Symptomatic but completely ambulatory  General: Well-developed, well-nourished, no acute distress. Eyes: Pink conjunctiva, anicteric sclera. HEENT: Normocephalic, moist mucous membranes, clear oropharnyx. Lungs: Clear to auscultation bilaterally. Heart: Regular rate and rhythm. No rubs, murmurs, or gallops. Abdomen: Soft, nontender, nondistended. No organomegaly noted, normoactive bowel sounds. Musculoskeletal: No edema, cyanosis, or clubbing. Neuro: Alert, answering all questions appropriately. Cranial nerves grossly intact. Skin: No rashes or petechiae noted. Psych: Normal  affect. Lymphatics: No cervical, calvicular, axillary or inguinal LAD.   LAB RESULTS:  Lab Results  Component Value Date   NA 135 02/17/2015   K 4.5 02/17/2015   CL 102 02/17/2015   CO2 25 02/17/2015   GLUCOSE 178* 02/17/2015   BUN 16 02/17/2015   CREATININE 0.91 02/17/2015   CALCIUM 9.4 02/17/2015   PROT 7.8 02/16/2015   ALBUMIN 4.0 02/16/2015   AST 27 02/16/2015   ALT 18 02/16/2015   ALKPHOS 56 02/16/2015   BILITOT 0.3 02/16/2015   GFRNONAA 56* 02/17/2015   GFRAA >60 02/17/2015    Lab Results  Component Value Date   WBC 6.8 02/26/2015   HGB 8.7* 02/26/2015   HCT 30.0* 02/26/2015   MCV 68.6* 02/26/2015   PLT 294 02/26/2015     STUDIES: No results found.  ASSESSMENT: Iron deficiency anemia.  PLAN:    1. Iron deficiency anemia: Patient's hemoglobin and iron stores are still significantly decreased despite receiving blood transfusion in the hospital several weeks ago. The remainder of her laboratory work is either negative or within normal limits.  She will benefit from 510 mg of IV Feraheme. Return to clinic in 1 and 2 weeks for her infusions. Patient will then return to clinic in 2 months with repeat laboratory work, further evaluation, and consideration of additional IV iron.  Patient expressed understanding and was in agreement with this plan. She also understands that She can call clinic at any time with any questions, concerns, or complaints.    Lloyd Huger, MD   03/10/2015 4:15 PM

## 2015-04-30 ENCOUNTER — Inpatient Hospital Stay: Payer: Medicare PPO

## 2015-04-30 ENCOUNTER — Inpatient Hospital Stay (HOSPITAL_BASED_OUTPATIENT_CLINIC_OR_DEPARTMENT_OTHER): Payer: Medicare PPO | Admitting: Oncology

## 2015-04-30 ENCOUNTER — Inpatient Hospital Stay: Payer: Medicare PPO | Attending: Oncology

## 2015-04-30 VITALS — BP 155/74 | HR 96 | Temp 96.2°F | Resp 18 | Wt 155.6 lb

## 2015-04-30 DIAGNOSIS — Z79899 Other long term (current) drug therapy: Secondary | ICD-10-CM

## 2015-04-30 DIAGNOSIS — E119 Type 2 diabetes mellitus without complications: Secondary | ICD-10-CM | POA: Diagnosis not present

## 2015-04-30 DIAGNOSIS — D509 Iron deficiency anemia, unspecified: Secondary | ICD-10-CM

## 2015-04-30 DIAGNOSIS — I10 Essential (primary) hypertension: Secondary | ICD-10-CM | POA: Diagnosis not present

## 2015-04-30 DIAGNOSIS — G2581 Restless legs syndrome: Secondary | ICD-10-CM | POA: Diagnosis not present

## 2015-04-30 DIAGNOSIS — Z87891 Personal history of nicotine dependence: Secondary | ICD-10-CM | POA: Diagnosis not present

## 2015-04-30 LAB — IRON AND TIBC
Iron: 82 ug/dL (ref 28–170)
Saturation Ratios: 23 % (ref 10.4–31.8)
TIBC: 350 ug/dL (ref 250–450)
UIBC: 268 ug/dL

## 2015-04-30 LAB — CBC WITH DIFFERENTIAL/PLATELET
Basophils Absolute: 0.1 10*3/uL (ref 0–0.1)
Basophils Relative: 1 %
Eosinophils Absolute: 0.4 10*3/uL (ref 0–0.7)
Eosinophils Relative: 7 %
HCT: 37.1 % (ref 35.0–47.0)
Hemoglobin: 12.4 g/dL (ref 12.0–16.0)
Lymphocytes Relative: 25 %
Lymphs Abs: 1.7 10*3/uL (ref 1.0–3.6)
MCH: 29.3 pg (ref 26.0–34.0)
MCHC: 33.5 g/dL (ref 32.0–36.0)
MCV: 87.4 fL (ref 80.0–100.0)
Monocytes Absolute: 0.4 10*3/uL (ref 0.2–0.9)
Monocytes Relative: 6 %
Neutro Abs: 4.3 10*3/uL (ref 1.4–6.5)
Neutrophils Relative %: 61 %
Platelets: 215 10*3/uL (ref 150–440)
RBC: 4.24 MIL/uL (ref 3.80–5.20)
RDW: 27.9 % — ABNORMAL HIGH (ref 11.5–14.5)
WBC: 6.9 10*3/uL (ref 3.6–11.0)

## 2015-04-30 LAB — FERRITIN: Ferritin: 55 ng/mL (ref 11–307)

## 2015-04-30 NOTE — Progress Notes (Signed)
Patient reports feeling wonderful and has gone back to work full time.

## 2015-05-07 NOTE — Progress Notes (Signed)
[facility]  Telephone:(336) [phone] Fax:(336) [phone]  ID: Nandhini Shwartz OB: [birth year]  MR#: [id number]  [id number]  Patient Care Team: Pcp Not In System as PCP - General  CHIEF COMPLAINT:  Chief Complaint  Patient presents with  . Anemia    INTERVAL HISTORY: Patient returns to clinic today for repeat laboratory work and further evaluation. She feels significantly improved after receiving IV iron recently. She currently feels well and is asymptomatic. She does not complain of weakness or fatigue. She has no neurologic complaints. She denies any recent fevers. She has a good appetite and denies weight loss. She has no chest pain or shortness of breath. She denies any nausea, vomiting, consultation, or diarrhea. She has no melanotic or hematochezia. Patient offers further specific complaints today.  REVIEW OF SYSTEMS:   Review of Systems  Constitutional: Negative for malaise/fatigue.  Respiratory: Negative.   Gastrointestinal: Negative for blood in stool and melena.  Neurological: Negative for weakness.    As per HPI. Otherwise, a complete review of systems is negatve.  PAST MEDICAL HISTORY: Past Medical History  Diagnosis Date  . Diabetes mellitus without complication   . Hypertension   . RLS (restless legs syndrome)   . Insomnia   . Iron deficiency anemia     PAST SURGICAL HISTORY: Past Surgical History  Procedure Laterality Date  . Bladder repair      FAMILY HISTORY: Reviewed and unchanged. No reported history of malignancy or chronic disease.     ADVANCED DIRECTIVES:    HEALTH MAINTENANCE: Social History  Substance Use Topics  . Smoking status: Former Research scientist (life sciences)  . Smokeless tobacco: Never Used  . Alcohol Use: No     Colonoscopy:  PAP:  Bone density:  Lipid panel:  Allergies  Allergen Reactions  . Codeine   . Demerol [Meperidine]   . Polysaccharide Iron Complex Other (See Comments)    Caused constipation  . Naproxen  Rash    Caused rash on mouth    Current Outpatient Prescriptions  Medication Sig Dispense Refill  . amLODipine (NORVASC) 10 MG tablet Take 10 mg by mouth daily.    Marland Kitchen BIOTIN PO Take 40,000 mcg by mouth daily at 2 PM daily at 2 PM.    . Cholecalciferol (VITAMIN D3) 1000 UNITS CAPS Take 2,000 Units by mouth daily at 2 PM daily at 2 PM.    . glimepiride (AMARYL) 2 MG tablet Take 2 mg by mouth daily with breakfast.    . lisinopril-hydrochlorothiazide (PRINZIDE,ZESTORETIC) 10-12.5 MG per tablet Take 1 tablet by mouth daily.    Marland Kitchen lovastatin (MEVACOR) 40 MG tablet Take 40 mg by mouth at bedtime.    . Magnesium 250 MG TABS Take 1 tablet by mouth daily at 2 PM daily at 2 PM.    . metFORMIN (GLUCOPHAGE) 1000 MG tablet Take 1,000 mg by mouth 2 (two) times daily with a meal.    . Multiple Vitamin (MULTIVITAMIN) capsule Take 1 capsule by mouth daily.    Marland Kitchen omeprazole (PRILOSEC) 20 MG capsule Take 20 mg by mouth daily.    . pramipexole (MIRAPEX) 0.25 MG tablet Take 0.25 mg by mouth 3 (three) times daily.    . vitamin E (VITAMIN E) 400 UNIT capsule Take 400 Units by mouth daily.     No current facility-administered medications for this visit.    OBJECTIVE: Filed Vitals:   04/30/15 1422  BP: 155/74  Pulse: 96  Temp: 96.2 F (35.7 C)  Resp: 18  Body mass index is 27.24 kg/(m^2).    ECOG FS:0 - Asymptomatic  General: Well-developed, well-nourished, no acute distress. Eyes: Pink conjunctiva, anicteric sclera. Lungs: Clear to auscultation bilaterally. Heart: Regular rate and rhythm. No rubs, murmurs, or gallops. Abdomen: Soft, nontender, nondistended. No organomegaly noted, normoactive bowel sounds. Musculoskeletal: No edema, cyanosis, or clubbing. Neuro: Alert, answering all questions appropriately. Cranial nerves grossly intact. Skin: No rashes or petechiae noted. Psych: Normal affect.   LAB RESULTS:  Lab Results  Component Value Date   NA 135 02/17/2015   K 4.5 02/17/2015   CL 102  02/17/2015   CO2 25 02/17/2015   GLUCOSE 178* 02/17/2015   BUN 16 02/17/2015   CREATININE 0.91 02/17/2015   CALCIUM 9.4 02/17/2015   PROT 7.8 02/16/2015   ALBUMIN 4.0 02/16/2015   AST 27 02/16/2015   ALT 18 02/16/2015   ALKPHOS 56 02/16/2015   BILITOT 0.3 02/16/2015   GFRNONAA 56* 02/17/2015   GFRAA >60 02/17/2015    Lab Results  Component Value Date   WBC 6.9 04/30/2015   NEUTROABS 4.3 04/30/2015   HGB 12.4 04/30/2015   HCT 37.1 04/30/2015   MCV 87.4 04/30/2015   PLT 215 04/30/2015     STUDIES: No results found.  ASSESSMENT: Iron deficiency anemia.  PLAN:    1. Iron deficiency anemia: Patient's hemoglobin and iron stores are now within normal limits. Previously, the remainder of her blood work was also within normal limits. She does not require additional Feraheme at this time. She last received IV Feraheme in July 2016. Return to clinic in 4 months with repeat laboratory work and further evaluation.  Patient expressed understanding and was in agreement with this plan. She also understands that She can call clinic at any time with any questions, concerns, or complaints.    Lloyd Huger, MD   05/07/2015 1:57 PM

## 2015-08-22 DIAGNOSIS — K219 Gastro-esophageal reflux disease without esophagitis: Secondary | ICD-10-CM | POA: Insufficient documentation

## 2015-08-30 ENCOUNTER — Inpatient Hospital Stay: Payer: Medicare PPO | Admitting: Oncology

## 2015-08-30 ENCOUNTER — Inpatient Hospital Stay: Payer: Medicare PPO

## 2015-08-30 ENCOUNTER — Inpatient Hospital Stay: Payer: Medicare PPO | Attending: Oncology

## 2016-04-29 NOTE — Progress Notes (Signed)
[place]  [phone] [phone]  ID: Lisa Mcneil OB: 04-Oct-1927  MR#: [id number]  PPI#:[id number]  Patient Care Team: Dion Body, MD as PCP - General (Family Medicine)  CHIEF COMPLAINT:  Iron deficiency anemia.  INTERVAL HISTORY: Patient last seen in clinic approximately one year ago. She is referred back after routine blood work noted a declining hemoglobin. She continues to feel well and is asymptomatic. She does not complain of weakness or fatigue. She has no neurologic complaints. She denies any recent fevers. She has a good appetite and denies weight loss. She has no chest pain or shortness of breath. She denies any nausea, vomiting, consultation, or diarrhea. She has no melanotic or hematochezia. Patient offers no specific complaints today.  REVIEW OF SYSTEMS:   Review of Systems  Constitutional: Negative for fever, malaise/fatigue and weight loss.  Respiratory: Negative.  Negative for cough and shortness of breath.   Cardiovascular: Negative.  Negative for chest pain.  Gastrointestinal: Negative for abdominal pain, blood in stool and melena.  Musculoskeletal: Negative.   Neurological: Negative.  Negative for weakness.  Psychiatric/Behavioral: Negative.  The patient is not nervous/anxious.     As per HPI. Otherwise, a complete review of systems is negative.  PAST MEDICAL HISTORY: Past Medical History:  Diagnosis Date  . Diabetes mellitus without complication   . Hypertension   . Insomnia   . Iron deficiency anemia   . RLS (restless legs syndrome)     PAST SURGICAL HISTORY: Past Surgical History:  Procedure Laterality Date  . BLADDER REPAIR      FAMILY HISTORY: Reviewed and unchanged. No reported history of malignancy or chronic disease.     ADVANCED DIRECTIVES:    HEALTH MAINTENANCE: Social History  Substance Use Topics  . Smoking status: Former Research scientist (life sciences)  . Smokeless tobacco: Never Used  . Alcohol use  No     Colonoscopy:  PAP:  Bone density:  Lipid panel:  Allergies  Allergen Reactions  . Codeine   . Demerol [Meperidine]   . Polysaccharide Iron Complex Other (See Comments)    Caused constipation  . Naproxen Rash    Caused rash on mouth    Current Outpatient Prescriptions  Medication Sig Dispense Refill  . amLODipine (NORVASC) 10 MG tablet Take 10 mg by mouth daily.    Marland Kitchen BIOTIN PO Take 40,000 mcg by mouth daily at 2 PM daily at 2 PM.    . Cholecalciferol (VITAMIN D3) 1000 UNITS CAPS Take 2,000 Units by mouth daily at 2 PM daily at 2 PM.    . cyclobenzaprine (FLEXERIL) 10 MG tablet Take 1 tablet by mouth at bedtime as needed.    Marland Kitchen glimepiride (AMARYL) 2 MG tablet Take 2 mg by mouth daily with breakfast.    . lisinopril-hydrochlorothiazide (PRINZIDE,ZESTORETIC) 10-12.5 MG per tablet Take 1 tablet by mouth daily.    Marland Kitchen lovastatin (MEVACOR) 40 MG tablet Take 40 mg by mouth at bedtime.    . Magnesium 250 MG TABS Take 1 tablet by mouth daily at 2 PM daily at 2 PM.    . meloxicam (MOBIC) 15 MG tablet Take 1 tablet by mouth daily.    . metFORMIN (GLUCOPHAGE) 1000 MG tablet Take 1,000 mg by mouth 2 (two) times daily with a meal.    . Multiple Vitamin (MULTIVITAMIN) capsule Take 1 capsule by mouth daily.    Marland Kitchen omeprazole (PRILOSEC) 20 MG capsule Take 20 mg by mouth daily.    . pramipexole (MIRAPEX) 0.25 MG tablet Take  0.25 mg by mouth 3 (three) times daily.    . predniSONE (DELTASONE) 10 MG tablet Take by mouth. Take 4 tabs daily for 3 days, then 3 tabs daily x 3 days, then 2 tabs daily for 3 days, then 1 tab daily x 3 days    . traMADol (ULTRAM) 50 MG tablet Take 1 tablet by mouth every 8 (eight) hours as needed.    . vitamin E (VITAMIN E) 400 UNIT capsule Take 400 Units by mouth daily.     No current facility-administered medications for this visit.     OBJECTIVE: Vitals:   05/01/16 1008  BP: (!) 163/80  Pulse: (!) 111  Resp: 18  Temp: (!) 96.8 F (36 C)     Body mass index is  27.97 kg/m.    ECOG FS:0 - Asymptomatic  General: Well-developed, well-nourished, no acute distress. Eyes: Pink conjunctiva, anicteric sclera. Lungs: Clear to auscultation bilaterally. Heart: Regular rate and rhythm. No rubs, murmurs, or gallops. Abdomen: Soft, nontender, nondistended. No organomegaly noted, normoactive bowel sounds. Musculoskeletal: No edema, cyanosis, or clubbing. Neuro: Alert, answering all questions appropriately. Cranial nerves grossly intact. Skin: No rashes or petechiae noted. Psych: Normal affect.   LAB RESULTS:  Lab Results  Component Value Date   NA 135 02/17/2015   K 4.5 02/17/2015   CL 102 02/17/2015   CO2 25 02/17/2015   GLUCOSE 178 (H) 02/17/2015   BUN 16 02/17/2015   CREATININE 0.91 02/17/2015   CALCIUM 9.4 02/17/2015   PROT 7.8 02/16/2015   ALBUMIN 4.0 02/16/2015   AST 27 02/16/2015   ALT 18 02/16/2015   ALKPHOS 56 02/16/2015   BILITOT 0.3 02/16/2015   GFRNONAA 56 (L) 02/17/2015   GFRAA >60 02/17/2015    Lab Results  Component Value Date   WBC 10.4 05/01/2016   NEUTROABS 9.0 (H) 05/01/2016   HGB 8.9 (L) 05/01/2016   HCT 27.7 (L) 05/01/2016   MCV 74.5 (L) 05/01/2016   PLT 317 05/01/2016     STUDIES: No results found.  ASSESSMENT: Iron deficiency anemia.  PLAN:    1. Iron deficiency anemia: Patient's hemoglobin Has trended down significantly along with her MCV. Iron stores are pending at time of dictation. Previously, the remainder of her laboratory work was either negative or within normal limits. Proceed with 510 mg IV Feraheme today. Patient is having cataract surgery next week, therefore she will return to clinic in 2 weeks for a second infusion. Patient will then return to clinic in 3 months with repeat laboratory work and further evaluation. 2. Hypertension: Patient's blood pressure is elevated today, continue current medications and treatment per PCP.   Patient expressed understanding and was in agreement with this plan.  She also understands that She can call clinic at any time with any questions, concerns, or complaints.    Lloyd Huger, MD   05/01/2016 10:55 AM

## 2016-05-01 ENCOUNTER — Inpatient Hospital Stay: Payer: Medicare PPO

## 2016-05-01 ENCOUNTER — Other Ambulatory Visit: Payer: Self-pay

## 2016-05-01 ENCOUNTER — Inpatient Hospital Stay: Payer: Medicare PPO | Attending: Oncology | Admitting: Oncology

## 2016-05-01 VITALS — BP 163/80 | HR 111 | Temp 96.8°F | Resp 18 | Wt 159.8 lb

## 2016-05-01 VITALS — BP 129/69 | HR 94 | Resp 18

## 2016-05-01 DIAGNOSIS — G4700 Insomnia, unspecified: Secondary | ICD-10-CM | POA: Diagnosis not present

## 2016-05-01 DIAGNOSIS — Z79899 Other long term (current) drug therapy: Secondary | ICD-10-CM | POA: Diagnosis not present

## 2016-05-01 DIAGNOSIS — Z87891 Personal history of nicotine dependence: Secondary | ICD-10-CM | POA: Insufficient documentation

## 2016-05-01 DIAGNOSIS — D509 Iron deficiency anemia, unspecified: Secondary | ICD-10-CM

## 2016-05-01 DIAGNOSIS — D649 Anemia, unspecified: Secondary | ICD-10-CM

## 2016-05-01 DIAGNOSIS — G2581 Restless legs syndrome: Secondary | ICD-10-CM | POA: Insufficient documentation

## 2016-05-01 DIAGNOSIS — I10 Essential (primary) hypertension: Secondary | ICD-10-CM | POA: Insufficient documentation

## 2016-05-01 DIAGNOSIS — E119 Type 2 diabetes mellitus without complications: Secondary | ICD-10-CM | POA: Diagnosis not present

## 2016-05-01 LAB — FERRITIN: Ferritin: 6 ng/mL — ABNORMAL LOW (ref 11–307)

## 2016-05-01 LAB — CBC WITH DIFFERENTIAL/PLATELET
Basophils Absolute: 0.1 10*3/uL (ref 0–0.1)
Basophils Relative: 1 %
Eosinophils Absolute: 0 10*3/uL (ref 0–0.7)
Eosinophils Relative: 0 %
HCT: 27.7 % — ABNORMAL LOW (ref 35.0–47.0)
Hemoglobin: 8.9 g/dL — ABNORMAL LOW (ref 12.0–16.0)
Lymphocytes Relative: 10 %
Lymphs Abs: 1 10*3/uL (ref 1.0–3.6)
MCH: 24.1 pg — ABNORMAL LOW (ref 26.0–34.0)
MCHC: 32.3 g/dL (ref 32.0–36.0)
MCV: 74.5 fL — ABNORMAL LOW (ref 80.0–100.0)
Monocytes Absolute: 0.3 10*3/uL (ref 0.2–0.9)
Monocytes Relative: 3 %
Neutro Abs: 9 10*3/uL — ABNORMAL HIGH (ref 1.4–6.5)
Neutrophils Relative %: 86 %
Platelets: 317 10*3/uL (ref 150–440)
RBC: 3.72 MIL/uL — ABNORMAL LOW (ref 3.80–5.20)
RDW: 16.8 % — ABNORMAL HIGH (ref 11.5–14.5)
WBC: 10.4 10*3/uL (ref 3.6–11.0)

## 2016-05-01 LAB — IRON AND TIBC
Iron: 22 ug/dL — ABNORMAL LOW (ref 28–170)
Saturation Ratios: 4 % — ABNORMAL LOW (ref 10.4–31.8)
TIBC: 537 ug/dL — ABNORMAL HIGH (ref 250–450)
UIBC: 515 ug/dL

## 2016-05-01 MED ORDER — SODIUM CHLORIDE 0.9 % IV SOLN
Freq: Once | INTRAVENOUS | Status: AC
  Administered 2016-05-01: 11:00:00 via INTRAVENOUS
  Filled 2016-05-01: qty 1000

## 2016-05-01 MED ORDER — SODIUM CHLORIDE 0.9 % IV SOLN
510.0000 mg | Freq: Once | INTRAVENOUS | Status: AC
  Administered 2016-05-01: 510 mg via INTRAVENOUS
  Filled 2016-05-01: qty 17

## 2016-05-01 NOTE — Progress Notes (Signed)
States has started treatment for sciatica. Offers no other complaints.

## 2016-05-08 ENCOUNTER — Encounter: Payer: Self-pay | Admitting: *Deleted

## 2016-05-08 ENCOUNTER — Inpatient Hospital Stay: Payer: Medicare PPO

## 2016-05-08 VITALS — BP 117/67 | HR 98 | Temp 98.2°F | Resp 18

## 2016-05-08 DIAGNOSIS — D509 Iron deficiency anemia, unspecified: Secondary | ICD-10-CM | POA: Diagnosis not present

## 2016-05-08 DIAGNOSIS — D649 Anemia, unspecified: Secondary | ICD-10-CM

## 2016-05-08 MED ORDER — SODIUM CHLORIDE 0.9 % IV SOLN
510.0000 mg | Freq: Once | INTRAVENOUS | Status: AC
  Administered 2016-05-08: 510 mg via INTRAVENOUS
  Filled 2016-05-08: qty 17

## 2016-05-08 MED ORDER — SODIUM CHLORIDE 0.9 % IV SOLN
Freq: Once | INTRAVENOUS | Status: AC
  Administered 2016-05-08: 14:00:00 via INTRAVENOUS
  Filled 2016-05-08: qty 1000

## 2016-05-13 ENCOUNTER — Ambulatory Visit: Payer: Medicare PPO | Admitting: Certified Registered Nurse Anesthetist

## 2016-05-13 ENCOUNTER — Ambulatory Visit
Admission: RE | Admit: 2016-05-13 | Discharge: 2016-05-13 | Disposition: A | Discharge status: Home / Self Care | Payer: Medicare PPO | Source: Ambulatory Visit | Attending: Ophthalmology | Admitting: Ophthalmology

## 2016-05-13 ENCOUNTER — Encounter
Admission: RE | Disposition: A | Discharge status: Home / Self Care | Payer: Self-pay | Source: Ambulatory Visit | Attending: Ophthalmology

## 2016-05-13 ENCOUNTER — Encounter: Payer: Self-pay | Admitting: *Deleted

## 2016-05-13 DIAGNOSIS — Z79899 Other long term (current) drug therapy: Secondary | ICD-10-CM | POA: Diagnosis not present

## 2016-05-13 DIAGNOSIS — H2511 Age-related nuclear cataract, right eye: Secondary | ICD-10-CM | POA: Insufficient documentation

## 2016-05-13 DIAGNOSIS — Z7984 Long term (current) use of oral hypoglycemic drugs: Secondary | ICD-10-CM | POA: Diagnosis not present

## 2016-05-13 DIAGNOSIS — E7800 Pure hypercholesterolemia, unspecified: Secondary | ICD-10-CM | POA: Insufficient documentation

## 2016-05-13 DIAGNOSIS — Z87891 Personal history of nicotine dependence: Secondary | ICD-10-CM | POA: Insufficient documentation

## 2016-05-13 DIAGNOSIS — E119 Type 2 diabetes mellitus without complications: Secondary | ICD-10-CM | POA: Diagnosis not present

## 2016-05-13 DIAGNOSIS — K219 Gastro-esophageal reflux disease without esophagitis: Secondary | ICD-10-CM | POA: Diagnosis not present

## 2016-05-13 DIAGNOSIS — I10 Essential (primary) hypertension: Secondary | ICD-10-CM | POA: Insufficient documentation

## 2016-05-13 HISTORY — DX: Gastro-esophageal reflux disease without esophagitis: K21.9

## 2016-05-13 HISTORY — PX: CATARACT EXTRACTION W/PHACO: SHX586

## 2016-05-13 LAB — GLUCOSE, CAPILLARY: Glucose-Capillary: 213 mg/dL — ABNORMAL HIGH (ref 65–99)

## 2016-05-13 SURGERY — PHACOEMULSIFICATION, CATARACT, WITH IOL INSERTION
Anesthesia: Monitor Anesthesia Care | Site: Eye | Laterality: Right | Wound class: Clean

## 2016-05-13 MED ORDER — CARBACHOL 0.01 % IO SOLN
INTRAOCULAR | Status: DC | PRN
  Administered 2016-05-13: 0.5 mL via INTRAOCULAR

## 2016-05-13 MED ORDER — CEFUROXIME OPHTHALMIC INJECTION 1 MG/0.1 ML
INJECTION | OPHTHALMIC | Status: DC | PRN
  Administered 2016-05-13: 0.1 mL via INTRACAMERAL

## 2016-05-13 MED ORDER — MOXIFLOXACIN HCL 0.5 % OP SOLN
OPHTHALMIC | Status: AC
  Filled 2016-05-13: qty 3

## 2016-05-13 MED ORDER — TETRACAINE HCL 0.5 % OP SOLN
OPHTHALMIC | Status: AC
  Filled 2016-05-13: qty 2

## 2016-05-13 MED ORDER — POVIDONE-IODINE 5 % OP SOLN
OPHTHALMIC | Status: AC
  Filled 2016-05-13: qty 30

## 2016-05-13 MED ORDER — CEFUROXIME OPHTHALMIC INJECTION 1 MG/0.1 ML
INJECTION | OPHTHALMIC | Status: AC
  Filled 2016-05-13: qty 0.1

## 2016-05-13 MED ORDER — NA CHONDROIT SULF-NA HYALURON 40-17 MG/ML IO SOLN
INTRAOCULAR | Status: DC | PRN
  Administered 2016-05-13: 1 mL via INTRAOCULAR

## 2016-05-13 MED ORDER — EPINEPHRINE HCL 1 MG/ML IJ SOLN
INTRAOCULAR | Status: DC | PRN
  Administered 2016-05-13: 1 mL via OPHTHALMIC

## 2016-05-13 MED ORDER — ARMC OPHTHALMIC DILATING DROPS
1.0000 "application " | OPHTHALMIC | Status: AC
  Administered 2016-05-13 (×3): 1 via OPHTHALMIC
  Filled 2016-05-13: qty 0.4

## 2016-05-13 MED ORDER — MOXIFLOXACIN HCL 0.5 % OP SOLN
1.0000 [drp] | OPHTHALMIC | Status: AC
  Administered 2016-05-13 (×3): 1 [drp] via OPHTHALMIC

## 2016-05-13 MED ORDER — FENTANYL CITRATE (PF) 100 MCG/2ML IJ SOLN
INTRAMUSCULAR | Status: DC | PRN
  Administered 2016-05-13: 25 ug via INTRAVENOUS

## 2016-05-13 MED ORDER — MOXIFLOXACIN HCL 0.5 % OP SOLN
OPHTHALMIC | Status: DC | PRN
  Administered 2016-05-13: 1 [drp] via OPHTHALMIC

## 2016-05-13 MED ORDER — EPINEPHRINE HCL 1 MG/ML IJ SOLN
INTRAMUSCULAR | Status: AC
  Filled 2016-05-13: qty 1

## 2016-05-13 MED ORDER — LIDOCAINE HCL 3.5 % OP GEL
OPHTHALMIC | Status: AC
  Filled 2016-05-13: qty 1

## 2016-05-13 MED ORDER — SODIUM CHLORIDE 0.9 % IV SOLN
INTRAVENOUS | Status: DC
  Administered 2016-05-13: 06:00:00 via INTRAVENOUS

## 2016-05-13 MED ORDER — NA CHONDROIT SULF-NA HYALURON 40-17 MG/ML IO SOLN
INTRAOCULAR | Status: AC
  Filled 2016-05-13: qty 1

## 2016-05-13 SURGICAL SUPPLY — 21 items
CANNULA ANT/CHMB 27GA (MISCELLANEOUS) ×2 IMPLANT
CUP MEDICINE 2OZ PLAST GRAD ST (MISCELLANEOUS) ×2 IMPLANT
GLOVE BIO SURGEON STRL SZ8 (GLOVE) ×2 IMPLANT
GLOVE BIOGEL M 6.5 STRL (GLOVE) ×2 IMPLANT
GLOVE SURG LX 8.0 MICRO (GLOVE) ×1
GLOVE SURG LX STRL 8.0 MICRO (GLOVE) ×1 IMPLANT
GOWN STRL REUS W/ TWL LRG LVL3 (GOWN DISPOSABLE) ×2 IMPLANT
GOWN STRL REUS W/TWL LRG LVL3 (GOWN DISPOSABLE) ×2
LENS IOL TECNIS ITEC 21.5 (Intraocular Lens) ×2 IMPLANT
PACK CATARACT (MISCELLANEOUS) ×2 IMPLANT
PACK CATARACT BRASINGTON LX (MISCELLANEOUS) ×2 IMPLANT
PACK EYE AFTER SURG (MISCELLANEOUS) ×2 IMPLANT
SOL BSS BAG (MISCELLANEOUS) ×2
SOL PREP PVP 2OZ (MISCELLANEOUS) ×2
SOLUTION BSS BAG (MISCELLANEOUS) ×1 IMPLANT
SOLUTION PREP PVP 2OZ (MISCELLANEOUS) ×1 IMPLANT
SYR 3ML LL SCALE MARK (SYRINGE) ×2 IMPLANT
SYR 5ML LL (SYRINGE) ×2 IMPLANT
SYR TB 1ML 27GX1/2 LL (SYRINGE) ×2 IMPLANT
WATER STERILE IRR 250ML POUR (IV SOLUTION) ×2 IMPLANT
WIPE NON LINTING 3.25X3.25 (MISCELLANEOUS) ×2 IMPLANT

## 2016-05-13 NOTE — Anesthesia Preprocedure Evaluation (Signed)
Anesthesia Evaluation  Patient identified by MRN, date of birth, ID band Patient awake    Reviewed: Allergy & Precautions, H&P , NPO status , Patient's Chart, lab work & pertinent test results, reviewed documented beta blocker date and time   History of Anesthesia Complications Negative for: history of anesthetic complications  Airway Mallampati: III  TM Distance: >3 FB Neck ROM: full    Dental no notable dental hx. (+) Partial Upper, Partial Lower   Pulmonary neg pulmonary ROS, former smoker,    Pulmonary exam normal breath sounds clear to auscultation       Cardiovascular Exercise Tolerance: Good hypertension, (-) angina(-) CAD, (-) Past MI, (-) Cardiac Stents and (-) CABG Normal cardiovascular exam(-) dysrhythmias (-) Valvular Problems/Murmurs Rhythm:regular Rate:Normal     Neuro/Psych negative neurological ROS  negative psych ROS   GI/Hepatic Neg liver ROS, GERD  ,  Endo/Other  diabetes, Oral Hypoglycemic Agents  Renal/GU negative Renal ROS  negative genitourinary   Musculoskeletal   Abdominal   Peds  Hematology  (+) Blood dyscrasia, anemia ,   Anesthesia Other Findings Past Medical History: No date: Diabetes mellitus without complication (HCC) No date: GERD (gastroesophageal reflux disease) No date: Hypertension No date: Insomnia No date: Iron deficiency anemia No date: RLS (restless legs syndrome)   Reproductive/Obstetrics negative OB ROS                             Anesthesia Physical Anesthesia Plan  ASA: II  Anesthesia Plan: MAC   Post-op Pain Management:    Induction:   Airway Management Planned:   Additional Equipment:   Intra-op Plan:   Post-operative Plan:   Informed Consent: I have reviewed the patients History and Physical, chart, labs and discussed the procedure including the risks, benefits and alternatives for the proposed anesthesia with the patient or  authorized representative who has indicated his/her understanding and acceptance.   Dental Advisory Given  Plan Discussed with: Anesthesiologist, CRNA and Surgeon  Anesthesia Plan Comments:         Anesthesia Quick Evaluation

## 2016-05-13 NOTE — Transfer of Care (Signed)
Immediate Anesthesia Transfer of Care Note  [name]  Procedure(s) Performed: Procedure(s) with comments: CATARACT EXTRACTION PHACO AND INTRAOCULAR LENS PLACEMENT (IOC) (Right) - [place] 1.09 AP% 21.0 CDE 14.60 Fluid Pack Lot # [id number]  Patient Location: PACU  Anesthesia Type:MAC  Level of Consciousness: awake, alert  and oriented  Airway & Oxygen Therapy: Patient Spontanous Breathing  Post-op Assessment: Report given to RN and Post -op Vital signs reviewed and stable  Post vital signs: Reviewed and stable  Last Vitals:  Vitals:   05/13/16 0614  BP: (!) 156/64  Pulse: 97  Resp: 16  Temp: 36.6 C    Last Pain:  Vitals:   05/13/16 0614  TempSrc: Oral         Complications: No apparent anesthesia complications

## 2016-05-13 NOTE — OR Nursing (Signed)
Pt sates she did not take her meds today and knows she took metformin last on Saturday but doesn't remember all her meds there are too many

## 2016-05-13 NOTE — Anesthesia Postprocedure Evaluation (Signed)
Anesthesia Post Note  [name]  Procedure(s) Performed: Procedure(s) (LRB): CATARACT EXTRACTION PHACO AND INTRAOCULAR LENS PLACEMENT (IOC) (Right)  Patient location during evaluation: PACU Anesthesia Type: MAC Level of consciousness: awake and alert and oriented Pain management: satisfactory to patient Vital Signs Assessment: post-procedure vital signs reviewed and stable Respiratory status: respiratory function stable Cardiovascular status: stable Anesthetic complications: no    Last Vitals:  Vitals:   05/13/16 0614  BP: (!) 156/64  Pulse: 97  Resp: 16  Temp: 36.6 C    Last Pain:  Vitals:   05/13/16 0092  TempSrc: Oral                 Blima Singer

## 2016-05-13 NOTE — H&P (Signed)
All labs reviewed. Abnormal studies sent to patients PCP when indicated.  Previous H&P reviewed, patient examined, there are NO CHANGES.  PORFILIO,WILLIAM LOUIS10/3/20177:31 AM

## 2016-05-13 NOTE — Op Note (Signed)
PREOPERATIVE DIAGNOSIS:  Nuclear sclerotic cataract of the right eye.   POSTOPERATIVE DIAGNOSIS: NUCLEAR SCLEROTIC CATARACT RIGHT EYE   OPERATIVE PROCEDURE:  Procedure(s): CATARACT EXTRACTION PHACO AND INTRAOCULAR LENS PLACEMENT (IOC)   SURGEON:  Birder Robson, MD.   ANESTHESIA:  Anesthesiologist: Martha Clan, MD CRNA: Demetrius Charity, CRNA  1.      Managed anesthesia care. 2.      Topical tetracaine drops followed by 2% Xylocaine jelly applied in the preoperative holding area.   COMPLICATIONS:  None.   TECHNIQUE:   Stop and chop   DESCRIPTION OF PROCEDURE:  The patient was examined and consented in the preoperative holding area where the aforementioned topical anesthesia was applied to the right eye and then brought back to the Operating Room where the right eye was prepped and draped in the usual sterile ophthalmic fashion and a lid speculum was placed. A paracentesis was created with the side port blade and the anterior chamber was filled with viscoelastic. A near clear corneal incision was performed with the steel keratome. A continuous curvilinear capsulorrhexis was performed with a cystotome followed by the capsulorrhexis forceps. Hydrodissection and hydrodelineation were carried out with BSS on a blunt cannula. The lens was removed in a stop and chop  technique and the remaining cortical material was removed with the irrigation-aspiration handpiece. The capsular bag was inflated with viscoelastic and the Technis ZCB00  lens was placed in the capsular bag without complication. The remaining viscoelastic was removed from the eye with the irrigation-aspiration handpiece. The wounds were hydrated. The anterior chamber was flushed with Miostat and the eye was inflated to physiologic pressure. 0.1 mL of cefuroxime concentration 10 mg/mL was placed in the anterior chamber. The wounds were found to be water tight. The eye was dressed with Vigamox. The patient was given protective glasses to  wear throughout the day and a shield with which to sleep tonight. The patient was also given drops with which to begin a drop regimen today and will follow-up with me in one day.  Implant Name Type Inv. Item Serial No. Manufacturer Lot No. LRB No. Used  LENS IOL DIOP 21.5 - [id number] Intraocular Lens LENS IOL DIOP 21.5 [id number] AMO   Right 1   Procedure(s) with comments: CATARACT EXTRACTION PHACO AND INTRAOCULAR LENS PLACEMENT (IOC) (Right) - [place] 1.09 AP% 21.0 CDE 14.60 Fluid Pack Lot # [id number]  Electronically signed: [place] 05/13/2016 7:53 AM

## 2016-05-13 NOTE — Discharge Instructions (Signed)
Eye Surgery Discharge Instructions  Expect mild scratchy sensation or mild soreness. DO NOT RUB YOUR EYE!  The day of surgery:  Minimal physical activity, but bed rest is not required  No reading, computer work, or close hand work  No bending, lifting, or straining.  May watch TV  For 24 hours:  No driving, legal decisions, or alcoholic beverages  Safety precautions  Eat anything you prefer: It is better to start with liquids, then soup then solid foods.  _____ Eye patch should be worn until postoperative exam tomorrow.  ____ Solar shield eyeglasses should be worn for comfort in the sunlight/patch while sleeping  Resume all regular medications including aspirin or Coumadin if these were discontinued prior to surgery. You may shower, bathe, shave, or wash your hair. Tylenol may be taken for mild discomfort.  Call your doctor if you experience significant pain, nausea, or vomiting, fever > 101 or other signs of infection. [phone] or [phone] Specific instructions:  Follow-up Information    Tim Lair, MD .   Specialty:  Ophthalmology Why:  October 3 at [facility] information: [address] [place] [address] [phone]

## 2016-05-28 ENCOUNTER — Other Ambulatory Visit: Payer: Self-pay | Admitting: Physician Assistant

## 2016-05-28 DIAGNOSIS — M545 Low back pain, unspecified: Secondary | ICD-10-CM

## 2016-06-03 ENCOUNTER — Encounter: Admission: RE | Payer: Self-pay | Source: Ambulatory Visit

## 2016-06-03 ENCOUNTER — Ambulatory Visit: Admission: RE | Admit: 2016-06-03 | Payer: Medicare PPO | Source: Ambulatory Visit | Admitting: Ophthalmology

## 2016-06-03 SURGERY — PHACOEMULSIFICATION, CATARACT, WITH IOL INSERTION
Anesthesia: Choice | Laterality: Left

## 2016-06-17 ENCOUNTER — Ambulatory Visit: Admission: RE | Admit: 2016-06-17 | Payer: Medicare PPO | Source: Ambulatory Visit

## 2016-06-19 ENCOUNTER — Other Ambulatory Visit: Payer: Self-pay | Admitting: Physician Assistant

## 2016-06-19 DIAGNOSIS — M545 Low back pain, unspecified: Secondary | ICD-10-CM

## 2016-06-24 ENCOUNTER — Encounter (INDEPENDENT_AMBULATORY_CARE_PROVIDER_SITE_OTHER): Payer: Self-pay

## 2016-06-24 ENCOUNTER — Ambulatory Visit
Admission: RE | Admit: 2016-06-24 | Discharge: 2016-06-24 | Disposition: A | Discharge status: Home / Self Care | Payer: Medicare PPO | Source: Ambulatory Visit | Attending: Physician Assistant | Admitting: Physician Assistant

## 2016-06-24 DIAGNOSIS — M545 Low back pain, unspecified: Secondary | ICD-10-CM

## 2016-06-24 DIAGNOSIS — M48061 Spinal stenosis, lumbar region without neurogenic claudication: Secondary | ICD-10-CM | POA: Diagnosis not present

## 2016-06-24 DIAGNOSIS — M1288 Other specific arthropathies, not elsewhere classified, other specified site: Secondary | ICD-10-CM | POA: Diagnosis not present

## 2016-07-11 ENCOUNTER — Encounter: Payer: Self-pay | Admitting: *Deleted

## 2016-07-15 ENCOUNTER — Ambulatory Visit: Payer: Medicare PPO | Admitting: Anesthesiology

## 2016-07-15 ENCOUNTER — Encounter
Admission: RE | Disposition: A | Discharge status: Home / Self Care | Payer: Self-pay | Source: Ambulatory Visit | Attending: Ophthalmology

## 2016-07-15 ENCOUNTER — Encounter: Payer: Self-pay | Admitting: *Deleted

## 2016-07-15 ENCOUNTER — Ambulatory Visit
Admission: RE | Admit: 2016-07-15 | Discharge: 2016-07-15 | Disposition: A | Discharge status: Home / Self Care | Payer: Medicare PPO | Source: Ambulatory Visit | Attending: Ophthalmology | Admitting: Ophthalmology

## 2016-07-15 DIAGNOSIS — Z87891 Personal history of nicotine dependence: Secondary | ICD-10-CM | POA: Diagnosis not present

## 2016-07-15 DIAGNOSIS — K219 Gastro-esophageal reflux disease without esophagitis: Secondary | ICD-10-CM | POA: Diagnosis not present

## 2016-07-15 DIAGNOSIS — Z885 Allergy status to narcotic agent status: Secondary | ICD-10-CM | POA: Insufficient documentation

## 2016-07-15 DIAGNOSIS — H2512 Age-related nuclear cataract, left eye: Secondary | ICD-10-CM | POA: Diagnosis not present

## 2016-07-15 DIAGNOSIS — Z9841 Cataract extraction status, right eye: Secondary | ICD-10-CM | POA: Diagnosis not present

## 2016-07-15 DIAGNOSIS — E7800 Pure hypercholesterolemia, unspecified: Secondary | ICD-10-CM | POA: Insufficient documentation

## 2016-07-15 DIAGNOSIS — E1136 Type 2 diabetes mellitus with diabetic cataract: Secondary | ICD-10-CM | POA: Diagnosis not present

## 2016-07-15 DIAGNOSIS — I10 Essential (primary) hypertension: Secondary | ICD-10-CM | POA: Insufficient documentation

## 2016-07-15 HISTORY — PX: CATARACT EXTRACTION W/PHACO: SHX586

## 2016-07-15 LAB — GLUCOSE, CAPILLARY: Glucose-Capillary: 222 mg/dL — ABNORMAL HIGH (ref 65–99)

## 2016-07-15 SURGERY — PHACOEMULSIFICATION, CATARACT, WITH IOL INSERTION
Anesthesia: Monitor Anesthesia Care | Site: Eye | Laterality: Left | Wound class: Clean

## 2016-07-15 MED ORDER — MOXIFLOXACIN HCL 0.5 % OP SOLN
OPHTHALMIC | Status: DC | PRN
  Administered 2016-07-15: 9 [drp] via OPHTHALMIC

## 2016-07-15 MED ORDER — MOXIFLOXACIN HCL 0.5 % OP SOLN
OPHTHALMIC | Status: AC
  Administered 2016-07-15: 1 [drp] via OPHTHALMIC
  Filled 2016-07-15: qty 3

## 2016-07-15 MED ORDER — BSS IO SOLN
INTRAOCULAR | Status: DC | PRN
  Administered 2016-07-15: 200 mL via OPHTHALMIC

## 2016-07-15 MED ORDER — SODIUM CHLORIDE 0.9 % IV SOLN
INTRAVENOUS | Status: DC
  Administered 2016-07-15: 09:00:00 via INTRAVENOUS

## 2016-07-15 MED ORDER — EPINEPHRINE PF 1 MG/ML IJ SOLN
INTRAMUSCULAR | Status: AC
  Filled 2016-07-15: qty 2

## 2016-07-15 MED ORDER — LIDOCAINE HCL (PF) 4 % IJ SOLN
INTRAMUSCULAR | Status: AC
  Filled 2016-07-15: qty 5

## 2016-07-15 MED ORDER — MOXIFLOXACIN HCL 0.5 % OP SOLN
1.0000 [drp] | OPHTHALMIC | Status: AC
  Administered 2016-07-15 (×3): 1 [drp] via OPHTHALMIC

## 2016-07-15 MED ORDER — NA CHONDROIT SULF-NA HYALURON 40-17 MG/ML IO SOLN
INTRAOCULAR | Status: AC
  Filled 2016-07-15: qty 1

## 2016-07-15 MED ORDER — FENTANYL CITRATE (PF) 100 MCG/2ML IJ SOLN
INTRAMUSCULAR | Status: DC | PRN
  Administered 2016-07-15 (×2): 25 ug via INTRAVENOUS

## 2016-07-15 MED ORDER — MIDAZOLAM HCL 2 MG/2ML IJ SOLN
INTRAMUSCULAR | Status: DC | PRN
  Administered 2016-07-15: 1 mg via INTRAVENOUS

## 2016-07-15 MED ORDER — ARMC OPHTHALMIC DILATING DROPS
1.0000 "application " | OPHTHALMIC | Status: AC
  Administered 2016-07-15 (×3): 1 via OPHTHALMIC

## 2016-07-15 MED ORDER — ARMC OPHTHALMIC DILATING DROPS
OPHTHALMIC | Status: AC
  Administered 2016-07-15: 1 via OPHTHALMIC
  Filled 2016-07-15: qty 0.4

## 2016-07-15 MED ORDER — POVIDONE-IODINE 5 % OP SOLN
OPHTHALMIC | Status: AC
  Filled 2016-07-15: qty 30

## 2016-07-15 MED ORDER — LIDOCAINE HCL (PF) 4 % IJ SOLN
INTRAOCULAR | Status: DC | PRN
  Administered 2016-07-15: 4 mL via OPHTHALMIC

## 2016-07-15 MED ORDER — NA CHONDROIT SULF-NA HYALURON 40-17 MG/ML IO SOLN
INTRAOCULAR | Status: DC | PRN
  Administered 2016-07-15: 1 mL via INTRAOCULAR

## 2016-07-15 MED ORDER — CARBACHOL 0.01 % IO SOLN
INTRAOCULAR | Status: DC | PRN
  Administered 2016-07-15: 0.5 mL via INTRAOCULAR

## 2016-07-15 SURGICAL SUPPLY — 21 items
CANNULA ANT/CHMB 27GA (MISCELLANEOUS) ×2 IMPLANT
CUP MEDICINE 2OZ PLAST GRAD ST (MISCELLANEOUS) ×2 IMPLANT
GLOVE BIO SURGEON STRL SZ8 (GLOVE) ×2 IMPLANT
GLOVE BIOGEL M 6.5 STRL (GLOVE) ×2 IMPLANT
GLOVE SURG LX 8.0 MICRO (GLOVE) ×1
GLOVE SURG LX STRL 8.0 MICRO (GLOVE) ×1 IMPLANT
GOWN STRL REUS W/ TWL LRG LVL3 (GOWN DISPOSABLE) ×2 IMPLANT
GOWN STRL REUS W/TWL LRG LVL3 (GOWN DISPOSABLE) ×2
LENS IOL TECNIS ITEC 22.0 (Intraocular Lens) ×2 IMPLANT
PACK CATARACT (MISCELLANEOUS) ×2 IMPLANT
PACK CATARACT BRASINGTON LX (MISCELLANEOUS) ×2 IMPLANT
PACK EYE AFTER SURG (MISCELLANEOUS) ×2 IMPLANT
SOL BSS BAG (MISCELLANEOUS) ×2
SOL PREP PVP 2OZ (MISCELLANEOUS) ×2
SOLUTION BSS BAG (MISCELLANEOUS) ×1 IMPLANT
SOLUTION PREP PVP 2OZ (MISCELLANEOUS) ×1 IMPLANT
SYR 3ML LL SCALE MARK (SYRINGE) ×2 IMPLANT
SYR 5ML LL (SYRINGE) ×2 IMPLANT
SYR TB 1ML 27GX1/2 LL (SYRINGE) ×2 IMPLANT
WATER STERILE IRR 250ML POUR (IV SOLUTION) ×2 IMPLANT
WIPE NON LINTING 3.25X3.25 (MISCELLANEOUS) ×2 IMPLANT

## 2016-07-15 NOTE — Anesthesia Preprocedure Evaluation (Signed)
Anesthesia Evaluation  Patient identified by MRN, date of birth, ID band Patient awake    Reviewed: Allergy & Precautions, NPO status , Patient's Chart, lab work & pertinent test results  History of Anesthesia Complications Negative for: history of anesthetic complications  Airway Mallampati: II       Dental  (+) Partial Lower, Upper Dentures   Pulmonary neg pulmonary ROS, former smoker,           Cardiovascular hypertension, Pt. on medications (-) angina(-) Past MI and (-) CHF (-) Valvular Problems/Murmurs     Neuro/Psych negative neurological ROS     GI/Hepatic Neg liver ROS, GERD  Medicated,  Endo/Other  diabetes, Type 2, Oral Hypoglycemic Agents  Renal/GU negative Renal ROS     Musculoskeletal   Abdominal   Peds  Hematology  (+) anemia ,   Anesthesia Other Findings   Reproductive/Obstetrics                             Anesthesia Physical Anesthesia Plan  ASA: III  Anesthesia Plan: MAC   Post-op Pain Management:    Induction:   Airway Management Planned:   Additional Equipment:   Intra-op Plan:   Post-operative Plan:   Informed Consent: I have reviewed the patients History and Physical, chart, labs and discussed the procedure including the risks, benefits and alternatives for the proposed anesthesia with the patient or authorized representative who has indicated his/her understanding and acceptance.     Plan Discussed with:   Anesthesia Plan Comments:         Anesthesia Quick Evaluation

## 2016-07-15 NOTE — Discharge Instructions (Signed)
Eye Surgery Discharge Instructions  Expect mild scratchy sensation or mild soreness. DO NOT RUB YOUR EYE!  The day of surgery:  Minimal physical activity, but bed rest is not required  No reading, computer work, or close hand work  No bending, lifting, or straining.  May watch TV  For 24 hours:  No driving, legal decisions, or alcoholic beverages  Safety precautions  Eat anything you prefer: It is better to start with liquids, then soup then solid foods.  _____ Eye patch should be worn until postoperative exam tomorrow.  ____ Solar shield eyeglasses should be worn for comfort in the sunlight/patch while sleeping  Resume all regular medications including aspirin or Coumadin if these were discontinued prior to surgery. You may shower, bathe, shave, or wash your hair. Tylenol may be taken for mild discomfort.  Call your doctor if you experience significant pain, nausea, or vomiting, fever > 101 or other signs of infection. [phone] or [phone] Specific instructions:  Follow-up Information    PORFILIO,WILLIAM LOUIS, MD Follow up.   Specialty:  Ophthalmology Why:  December 6 at 9:35am Contact information: [address] [place] [address] [phone]

## 2016-07-15 NOTE — Transfer of Care (Signed)
Immediate Anesthesia Transfer of Care Note  [name]  Procedure(s) Performed: Procedure(s) with comments: CATARACT EXTRACTION PHACO AND INTRAOCULAR LENS PLACEMENT (IOC) (Left) - Lot# [id number] H [place]: 01:17.9 AP%:27.8 CDE: 21.67   Patient Location: PACU  Anesthesia Type:MAC  Level of Consciousness: awake, alert  and oriented  Airway & Oxygen Therapy: Patient Spontanous Breathing  Post-op Assessment: Report given to RN and Post -op Vital signs reviewed and stable  Post vital signs: Reviewed and stable  Last Vitals:  Vitals:   07/15/16 0941 07/15/16 0944  BP: 108/75 108/75  Pulse: 73 75  Resp: 16 20  Temp: 36.8 C     Last Pain:  Vitals:   07/15/16 0944  TempSrc: Temporal         Complications: No apparent anesthesia complications

## 2016-07-15 NOTE — Anesthesia Postprocedure Evaluation (Signed)
Anesthesia Post Note  [name]  Procedure(s) Performed: Procedure(s) (LRB): CATARACT EXTRACTION PHACO AND INTRAOCULAR LENS PLACEMENT (IOC) (Left)  Patient location during evaluation: Nursing Unit Anesthesia Type: MAC Level of consciousness: awake and alert and oriented Pain management: pain level controlled Vital Signs Assessment: post-procedure vital signs reviewed and stable Respiratory status: spontaneous breathing, nonlabored ventilation and respiratory function stable Cardiovascular status: blood pressure returned to baseline and stable Postop Assessment: no headache and no signs of nausea or vomiting Anesthetic complications: no    Last Vitals:  Vitals:   07/15/16 0941 07/15/16 0944  BP: 108/75 108/75  Pulse: 73 75  Resp: 16 20  Temp: 36.8 C     Last Pain:  Vitals:   07/15/16 0944  TempSrc: Temporal                 Darlyne Russian

## 2016-07-15 NOTE — H&P (Signed)
All labs reviewed. Abnormal studies sent to patients PCP when indicated.  Previous H&P reviewed, patient examined, there are NO CHANGES.  PORFILIO,WILLIAM LOUIS12/5/20179:16 AM

## 2016-07-15 NOTE — Op Note (Signed)
PREOPERATIVE DIAGNOSIS:  Nuclear sclerotic cataract of the left eye.   POSTOPERATIVE DIAGNOSIS:  Nuclear sclerotic cataract of the left eye.   OPERATIVE PROCEDURE: Procedure(s): CATARACT EXTRACTION PHACO AND INTRAOCULAR LENS PLACEMENT (IOC)   SURGEON:  Birder Robson, MD.   ANESTHESIA:  Anesthesiologist: Gunnar Fusi, MD CRNA: Darlyne Russian, CRNA  1.      Managed anesthesia care. 2.     0.68ml of Shugarcaine was instilled following the paracentesis   COMPLICATIONS:  None.   TECHNIQUE:   Stop and chop   DESCRIPTION OF PROCEDURE:  The patient was examined and consented in the preoperative holding area where the aforementioned topical anesthesia was applied to the left eye and then brought back to the Operating Room where the left eye was prepped and draped in the usual sterile ophthalmic fashion and a lid speculum was placed. A paracentesis was created with the side port blade and the anterior chamber was filled with viscoelastic. A near clear corneal incision was performed with the steel keratome. A continuous curvilinear capsulorrhexis was performed with a cystotome followed by the capsulorrhexis forceps. Hydrodissection and hydrodelineation were carried out with BSS on a blunt cannula. The lens was removed in a stop and chop  technique and the remaining cortical material was removed with the irrigation-aspiration handpiece. The capsular bag was inflated with viscoelastic and the Technis ZCB00 lens was placed in the capsular bag without complication. The remaining viscoelastic was removed from the eye with the irrigation-aspiration handpiece. The wounds were hydrated. The anterior chamber was flushed with Miostat and the eye was inflated to physiologic pressure. 0.91ml Vigamox was placed in the anterior chamber. The wounds were found to be water tight. The eye was dressed with Vigamox. The patient was given protective glasses to wear throughout the day and a shield with which to sleep  tonight. The patient was also given drops with which to begin a drop regimen today and will follow-up with me in one day.  Implant Name Type Inv. Item Serial No. Manufacturer Lot No. LRB No. Used  LENS IOL DIOP 22.0 - [id number] Intraocular Lens LENS IOL DIOP 22.0 [id number] AMO   Left 1    Procedure(s) with comments: CATARACT EXTRACTION PHACO AND INTRAOCULAR LENS PLACEMENT (IOC) (Left) - Lot# [id number] H [place]: 01:17.9 AP%:27.8 CDE: 21.67   Electronically signed: [place] 07/15/2016 9:43 AM

## 2016-07-31 ENCOUNTER — Ambulatory Visit: Payer: Medicare PPO | Admitting: Oncology

## 2016-07-31 ENCOUNTER — Other Ambulatory Visit: Payer: Medicare PPO

## 2016-07-31 ENCOUNTER — Ambulatory Visit: Payer: Medicare PPO

## 2016-08-20 ENCOUNTER — Other Ambulatory Visit: Payer: Self-pay | Admitting: Oncology

## 2016-08-20 NOTE — Progress Notes (Signed)
[name]  Telephone:(336) [phone] Fax:(336) [phone]  ID: Lisa Mcneil OB: [birth year]  MR#: [id number]  TGG#:[id number]  Patient Care Team: Dion Body, MD as PCP - General (Family Medicine)  CHIEF COMPLAINT:  Iron deficiency anemia.  INTERVAL HISTORY: Patient returns to clinic today for repeat laboratory work and further evaluation. She feels significantly improved after receiving IV iron approximately 3 months ago. She currently feels well and is asymptomatic. She does not complain of weakness or fatigue. She has no neurologic complaints. She denies any recent fevers. She has a good appetite and denies weight loss. She has no chest pain or shortness of breath. She denies any nausea, vomiting, consultation, or diarrhea. She has no melanotic or hematochezia. Patient offers no specific complaints today.  REVIEW OF SYSTEMS:   Review of Systems  Constitutional: Negative for fever, malaise/fatigue and weight loss.  Respiratory: Negative.  Negative for cough and shortness of breath.   Cardiovascular: Negative.  Negative for chest pain.  Gastrointestinal: Negative for abdominal pain, blood in stool and melena.  Musculoskeletal: Negative.   Neurological: Negative.  Negative for weakness.  Psychiatric/Behavioral: Negative.  The patient is not nervous/anxious.     As per HPI. Otherwise, a complete review of systems is negative.  PAST MEDICAL HISTORY: Past Medical History:  Diagnosis Date  . Diabetes mellitus without complication ([place])   . GERD (gastroesophageal reflux disease)   . Hypertension   . Insomnia   . Iron deficiency anemia   . RLS (restless legs syndrome)     PAST SURGICAL HISTORY: Past Surgical History:  Procedure Laterality Date  . BLADDER REPAIR    . CATARACT EXTRACTION W/PHACO Right 05/13/2016   Procedure: CATARACT EXTRACTION PHACO AND INTRAOCULAR LENS PLACEMENT (IOC);  Surgeon: Birder Robson, MD;  Location: [facility] ORS;  Service:  Ophthalmology;  Laterality: Right;  [place] 1.09 AP% 21.0 CDE 14.60 Fluid Pack Lot # [id number]  . CATARACT EXTRACTION W/PHACO Left 07/15/2016   Procedure: CATARACT EXTRACTION PHACO AND INTRAOCULAR LENS PLACEMENT (IOC);  Surgeon: Birder Robson, MD;  Location: [facility] ORS;  Service: Ophthalmology;  Laterality: Left;  Lot# [id number] H [place]: 01:17.9 AP%:27.8 CDE: 21.67     FAMILY HISTORY: Reviewed and unchanged. No reported history of malignancy or chronic disease.     ADVANCED DIRECTIVES:    HEALTH MAINTENANCE: Social History  Substance Use Topics  . Smoking status: Former Research scientist (life sciences)  . Smokeless tobacco: Never Used  . Alcohol use No     Colonoscopy:  PAP:  Bone density:  Lipid panel:  Allergies  Allergen Reactions  . Codeine   . Demerol [Meperidine]   . Polysaccharide Iron Complex Other (See Comments)    Caused constipation  . Naproxen Rash    Caused rash on mouth    Current Outpatient Prescriptions  Medication Sig Dispense Refill  . amLODipine (NORVASC) 10 MG tablet Take 10 mg by mouth daily.    . Atorvastatin Calcium (LIPITOR PO) Take by mouth daily.    Marland Kitchen BIOTIN PO Take 40,000 mcg by mouth daily at 2 PM daily at 2 PM.    . Cholecalciferol (VITAMIN D3) 1000 UNITS CAPS Take 2,000 Units by mouth daily at 2 PM daily at 2 PM.    . glimepiride (AMARYL) 2 MG tablet Take 2 mg by mouth daily with breakfast.    . lisinopril-hydrochlorothiazide (PRINZIDE,ZESTORETIC) 10-12.5 MG per tablet Take 1 tablet by mouth daily.    . metFORMIN (GLUCOPHAGE) 1000 MG tablet Take 1,000 mg by mouth 2 (two) times daily with a meal.    .  Multiple Vitamin (MULTIVITAMIN) capsule Take 1 capsule by mouth daily.    Marland Kitchen omeprazole (PRILOSEC) 20 MG capsule Take 20 mg by mouth daily.    . pramipexole (MIRAPEX) 0.25 MG tablet Take 0.25 mg by mouth daily. 2 tabs daily    . traMADol (ULTRAM) 50 MG tablet Take by mouth every 8 (eight) hours.    . traZODone (DESYREL) 50 MG tablet Take by mouth.    . vitamin E (VITAMIN E)  400 UNIT capsule Take 400 Units by mouth daily.    . cyclobenzaprine (FLEXERIL) 10 MG tablet Take 10 mg by mouth at bedtime.    . Magnesium 250 MG TABS Take 1 tablet by mouth daily at 2 PM daily at 2 PM.    . meloxicam (MOBIC) 15 MG tablet Take 1 tablet by mouth daily.     No current facility-administered medications for this visit.     OBJECTIVE: Vitals:   08/21/16 1438  BP: 126/76  Pulse: 80  Resp: 18  Temp: 97.9 F (36.6 C)     Body mass index is 27.5 kg/m.    ECOG FS:0 - Asymptomatic  General: Well-developed, well-nourished, no acute distress. Eyes: Pink conjunctiva, anicteric sclera. Lungs: Clear to auscultation bilaterally. Heart: Regular rate and rhythm. No rubs, murmurs, or gallops. Abdomen: Soft, nontender, nondistended. No organomegaly noted, normoactive bowel sounds. Musculoskeletal: No edema, cyanosis, or clubbing. Neuro: Alert, answering all questions appropriately. Cranial nerves grossly intact. Skin: No rashes or petechiae noted. Psych: Normal affect.   LAB RESULTS:  Lab Results  Component Value Date   NA 135 02/17/2015   K 4.5 02/17/2015   CL 102 02/17/2015   CO2 25 02/17/2015   GLUCOSE 178 (H) 02/17/2015   BUN 16 02/17/2015   CREATININE 0.91 02/17/2015   CALCIUM 9.4 02/17/2015   PROT 7.8 02/16/2015   ALBUMIN 4.0 02/16/2015   AST 27 02/16/2015   ALT 18 02/16/2015   ALKPHOS 56 02/16/2015   BILITOT 0.3 02/16/2015   GFRNONAA 56 (L) 02/17/2015   GFRAA >60 02/17/2015    Lab Results  Component Value Date   WBC 8.1 08/21/2016   NEUTROABS 5.2 08/21/2016   HGB 11.7 (L) 08/21/2016   HCT 34.2 (L) 08/21/2016   MCV 91.0 08/21/2016   PLT 242 08/21/2016   Lab Results  Component Value Date   IRON 49 08/21/2016   TIBC 337 08/21/2016   IRONPCTSAT 15 08/21/2016   Lab Results  Component Value Date   FERRITIN 32 08/21/2016     STUDIES: No results found.  ASSESSMENT: Iron deficiency anemia.  PLAN:    1. Iron deficiency anemia: Patient's  hemoglobin Is significantly improved and now nearly within normal limits. Her iron stores have also improved and are within normal limits. No intervention is needed at this time. Patient does not require additional IV iron. She last received 510 mg IV Feraheme in September 2017. Return to clinic in 3 months with repeat laboratory work and further evaluation.  2. Hypertension: Patient's blood pressure is within normal limits today, continue current medications and treatment per PCP.   Patient expressed understanding and was in agreement with this plan. She also understands that She can call clinic at any time with any questions, concerns, or complaints.    Lloyd Huger, MD   08/24/2016 9:33 AM

## 2016-08-21 ENCOUNTER — Inpatient Hospital Stay: Payer: Medicare PPO | Attending: Oncology

## 2016-08-21 ENCOUNTER — Inpatient Hospital Stay (HOSPITAL_BASED_OUTPATIENT_CLINIC_OR_DEPARTMENT_OTHER): Payer: Medicare PPO | Admitting: Oncology

## 2016-08-21 ENCOUNTER — Inpatient Hospital Stay: Payer: Medicare PPO

## 2016-08-21 VITALS — BP 126/76 | HR 80 | Temp 97.9°F | Resp 18 | Wt 150.4 lb

## 2016-08-21 DIAGNOSIS — D509 Iron deficiency anemia, unspecified: Secondary | ICD-10-CM

## 2016-08-21 DIAGNOSIS — G4700 Insomnia, unspecified: Secondary | ICD-10-CM | POA: Diagnosis not present

## 2016-08-21 DIAGNOSIS — K219 Gastro-esophageal reflux disease without esophagitis: Secondary | ICD-10-CM | POA: Insufficient documentation

## 2016-08-21 DIAGNOSIS — Z7984 Long term (current) use of oral hypoglycemic drugs: Secondary | ICD-10-CM | POA: Insufficient documentation

## 2016-08-21 DIAGNOSIS — G2581 Restless legs syndrome: Secondary | ICD-10-CM | POA: Insufficient documentation

## 2016-08-21 DIAGNOSIS — I10 Essential (primary) hypertension: Secondary | ICD-10-CM

## 2016-08-21 DIAGNOSIS — Z79899 Other long term (current) drug therapy: Secondary | ICD-10-CM

## 2016-08-21 DIAGNOSIS — Z87891 Personal history of nicotine dependence: Secondary | ICD-10-CM | POA: Insufficient documentation

## 2016-08-21 DIAGNOSIS — E119 Type 2 diabetes mellitus without complications: Secondary | ICD-10-CM | POA: Diagnosis not present

## 2016-08-21 DIAGNOSIS — D508 Other iron deficiency anemias: Secondary | ICD-10-CM

## 2016-08-21 LAB — CBC WITH DIFFERENTIAL/PLATELET
Basophils Absolute: 0.1 10*3/uL (ref 0–0.1)
Basophils Relative: 1 %
Eosinophils Absolute: 0.3 10*3/uL (ref 0–0.7)
Eosinophils Relative: 3 %
HCT: 34.2 % — ABNORMAL LOW (ref 35.0–47.0)
Hemoglobin: 11.7 g/dL — ABNORMAL LOW (ref 12.0–16.0)
Lymphocytes Relative: 25 %
Lymphs Abs: 2 10*3/uL (ref 1.0–3.6)
MCH: 31.2 pg (ref 26.0–34.0)
MCHC: 34.3 g/dL (ref 32.0–36.0)
MCV: 91 fL (ref 80.0–100.0)
Monocytes Absolute: 0.5 10*3/uL (ref 0.2–0.9)
Monocytes Relative: 6 %
Neutro Abs: 5.2 10*3/uL (ref 1.4–6.5)
Neutrophils Relative %: 65 %
Platelets: 242 10*3/uL (ref 150–440)
RBC: 3.76 MIL/uL — ABNORMAL LOW (ref 3.80–5.20)
RDW: 14.1 % (ref 11.5–14.5)
WBC: 8.1 10*3/uL (ref 3.6–11.0)

## 2016-08-21 LAB — IRON AND TIBC
Iron: 49 ug/dL (ref 28–170)
Saturation Ratios: 15 % (ref 10.4–31.8)
TIBC: 337 ug/dL (ref 250–450)
UIBC: 288 ug/dL

## 2016-08-21 LAB — FERRITIN: Ferritin: 32 ng/mL (ref 11–307)

## 2016-08-21 NOTE — Progress Notes (Signed)
Patient is here for follow up, she is doing well 

## 2016-11-19 ENCOUNTER — Other Ambulatory Visit: Payer: Medicare PPO

## 2016-11-19 ENCOUNTER — Ambulatory Visit: Payer: Medicare PPO | Admitting: Oncology

## 2016-11-19 ENCOUNTER — Ambulatory Visit: Payer: Medicare PPO

## 2016-11-26 ENCOUNTER — Inpatient Hospital Stay: Payer: Medicare PPO

## 2016-11-26 ENCOUNTER — Inpatient Hospital Stay: Payer: Medicare PPO | Admitting: Oncology

## 2017-05-07 DIAGNOSIS — F5101 Primary insomnia: Secondary | ICD-10-CM | POA: Insufficient documentation

## 2017-05-17 NOTE — Progress Notes (Signed)
[place]  [phone] Fax:(336) [phone]  ID: Lashay Osborne OB: [birth year]  MR#: [id number]  SWN#:[id number]  Patient Care Team: Dion Body, MD as PCP - General (Family Medicine)  CHIEF COMPLAINT:  Iron deficiency anemia.  INTERVAL HISTORY: Patient returns to clinic today as an add-on after her hemoglobin was noted to be 7.9 approximately one month ago. She is not been evaluated in clinic since January 2018. She has significant weakness and fatigue, but otherwise feels well. She has no neurologic complaints. She denies any recent fevers. She has a good appetite and denies weight loss. She has no chest pain or shortness of breath. She denies any nausea, vomiting, consultation, or diarrhea. She has no melanotic or hematochezia. Patient offers no further specific complaints today.  REVIEW OF SYSTEMS:   Review of Systems  Constitutional: Negative for fever, malaise/fatigue and weight loss.  Respiratory: Negative.  Negative for cough and shortness of breath.   Cardiovascular: Negative.  Negative for chest pain and leg swelling.  Gastrointestinal: Negative for abdominal pain, blood in stool and melena.  Genitourinary: Negative.  Negative for hematuria.  Musculoskeletal: Negative.   Skin: Negative.  Negative for rash.  Neurological: Negative.  Negative for weakness.  Psychiatric/Behavioral: Negative.  The patient is not nervous/anxious.     As per HPI. Otherwise, a complete review of systems is negative.  PAST MEDICAL HISTORY: Past Medical History:  Diagnosis Date  . Diabetes mellitus without complication ([place])   . GERD (gastroesophageal reflux disease)   . Hypertension   . Insomnia   . Iron deficiency anemia   . RLS (restless legs syndrome)     PAST SURGICAL HISTORY: Past Surgical History:  Procedure Laterality Date  . BLADDER REPAIR    . CATARACT EXTRACTION W/PHACO Right 05/13/2016   Procedure: CATARACT EXTRACTION PHACO AND  INTRAOCULAR LENS PLACEMENT (IOC);  Surgeon: Birder Robson, MD;  Location: [facility] ORS;  Service: Ophthalmology;  Laterality: Right;  [place] 1.09 AP% 21.0 CDE 14.60 Fluid Pack Lot # [id number]  . CATARACT EXTRACTION W/PHACO Left 07/15/2016   Procedure: CATARACT EXTRACTION PHACO AND INTRAOCULAR LENS PLACEMENT (IOC);  Surgeon: Birder Robson, MD;  Location: [facility] ORS;  Service: Ophthalmology;  Laterality: Left;  Lot# [id number] H [place]: 01:17.9 AP%:27.8 CDE: 21.67     FAMILY HISTORY: Reviewed and unchanged. No reported history of malignancy or chronic disease.     ADVANCED DIRECTIVES:    HEALTH MAINTENANCE: Social History  Substance Use Topics  . Smoking status: Former Research scientist (life sciences)  . Smokeless tobacco: Never Used  . Alcohol use No     Colonoscopy:  PAP:  Bone density:  Lipid panel:  Allergies  Allergen Reactions  . Codeine   . Demerol [Meperidine]   . Polysaccharide Iron Complex Other (See Comments)    Caused constipation  . Naproxen Rash    Caused rash on mouth    Current Outpatient Prescriptions  Medication Sig Dispense Refill  . amLODipine (NORVASC) 10 MG tablet Take 10 mg by mouth daily.    . Atorvastatin Calcium (LIPITOR PO) Take by mouth daily.    Marland Kitchen lisinopril-hydrochlorothiazide (PRINZIDE,ZESTORETIC) 10-12.5 MG per tablet Take 1 tablet by mouth daily.    . metFORMIN (GLUCOPHAGE) 1000 MG tablet Take 1,000 mg by mouth 2 (two) times daily with a meal.    . omeprazole (PRILOSEC) 20 MG capsule Take 20 mg by mouth daily.    . traZODone (DESYREL) 50 MG tablet Take by mouth.    . vitamin E (VITAMIN E) 400 UNIT capsule Take 400  Units by mouth daily.    . Multiple Vitamin (MULTIVITAMIN) capsule Take 1 capsule by mouth daily.     No current facility-administered medications for this visit.     OBJECTIVE: Vitals:   05/18/17 1425  BP: (!) 146/67  Pulse: 97  Resp: 18  Temp: 98 F (36.7 C)     Body mass index is 27.6 kg/m.    ECOG FS:0 - Asymptomatic  General: Well-developed,  well-nourished, no acute distress. Eyes: Pink conjunctiva, anicteric sclera. Lungs: Clear to auscultation bilaterally. Heart: Regular rate and rhythm. No rubs, murmurs, or gallops. Abdomen: Soft, nontender, nondistended. No organomegaly noted, normoactive bowel sounds. Musculoskeletal: No edema, cyanosis, or clubbing. Neuro: Alert, answering all questions appropriately. Cranial nerves grossly intact. Skin: No rashes or petechiae noted. Psych: Normal affect.   LAB RESULTS:  Lab Results  Component Value Date   NA 135 02/17/2015   K 4.5 02/17/2015   CL 102 02/17/2015   CO2 25 02/17/2015   GLUCOSE 178 (H) 02/17/2015   BUN 16 02/17/2015   CREATININE 0.91 02/17/2015   CALCIUM 9.4 02/17/2015   PROT 7.8 02/16/2015   ALBUMIN 4.0 02/16/2015   AST 27 02/16/2015   ALT 18 02/16/2015   ALKPHOS 56 02/16/2015   BILITOT 0.3 02/16/2015   GFRNONAA 56 (L) 02/17/2015   GFRAA >60 02/17/2015    Lab Results  Component Value Date   WBC 8.1 08/21/2016   NEUTROABS 5.2 08/21/2016   HGB 11.7 (L) 08/21/2016   HCT 34.2 (L) 08/21/2016   MCV 91.0 08/21/2016   PLT 242 08/21/2016   Lab Results  Component Value Date   IRON 49 08/21/2016   TIBC 337 08/21/2016   IRONPCTSAT 15 08/21/2016   Lab Results  Component Value Date   FERRITIN 32 08/21/2016     STUDIES: No results found.  ASSESSMENT: Iron deficiency anemia.  PLAN:    1. Iron deficiency anemia: Patient's most recent hemoglobin was reported at 7.9. She is also symptomatic therefore will proceed with 510 mg IV Feraheme today. Return to clinic in 1 week for a second infusion. Patient will then return to clinic in 2 months with repeat laboratory work and further evaluation.  2. Hypertension: Patient's blood pressure is mildly elevated today, continue current medications and treatment per PCP.   Approximately 30 minutes was spent in discussion of which greater than 50% was consultation.  Patient expressed understanding and was in  agreement with this plan. She also understands that She can call clinic at any time with any questions, concerns, or complaints.    Lloyd Huger, MD   05/18/2017 4:18 PM

## 2017-05-18 ENCOUNTER — Inpatient Hospital Stay: Payer: Medicare PPO

## 2017-05-18 ENCOUNTER — Inpatient Hospital Stay: Payer: Medicare PPO | Attending: Oncology | Admitting: Oncology

## 2017-05-18 VITALS — BP 146/67 | HR 97 | Temp 98.0°F | Resp 18 | Wt 150.9 lb

## 2017-05-18 DIAGNOSIS — E119 Type 2 diabetes mellitus without complications: Secondary | ICD-10-CM | POA: Diagnosis not present

## 2017-05-18 DIAGNOSIS — Z79899 Other long term (current) drug therapy: Secondary | ICD-10-CM | POA: Diagnosis not present

## 2017-05-18 DIAGNOSIS — K219 Gastro-esophageal reflux disease without esophagitis: Secondary | ICD-10-CM | POA: Insufficient documentation

## 2017-05-18 DIAGNOSIS — I10 Essential (primary) hypertension: Secondary | ICD-10-CM | POA: Diagnosis not present

## 2017-05-18 DIAGNOSIS — G4700 Insomnia, unspecified: Secondary | ICD-10-CM | POA: Diagnosis not present

## 2017-05-18 DIAGNOSIS — D509 Iron deficiency anemia, unspecified: Secondary | ICD-10-CM | POA: Insufficient documentation

## 2017-05-18 DIAGNOSIS — G2581 Restless legs syndrome: Secondary | ICD-10-CM | POA: Insufficient documentation

## 2017-05-18 DIAGNOSIS — Z7984 Long term (current) use of oral hypoglycemic drugs: Secondary | ICD-10-CM | POA: Insufficient documentation

## 2017-05-18 DIAGNOSIS — D508 Other iron deficiency anemias: Secondary | ICD-10-CM

## 2017-05-18 DIAGNOSIS — Z87891 Personal history of nicotine dependence: Secondary | ICD-10-CM | POA: Diagnosis not present

## 2017-05-18 MED ORDER — SODIUM CHLORIDE 0.9 % IV SOLN
510.0000 mg | Freq: Once | INTRAVENOUS | Status: AC
  Administered 2017-05-18: 510 mg via INTRAVENOUS
  Filled 2017-05-18: qty 17

## 2017-05-18 MED ORDER — SODIUM CHLORIDE 0.9 % IV SOLN
Freq: Once | INTRAVENOUS | Status: AC
  Administered 2017-05-18: 15:00:00 via INTRAVENOUS
  Filled 2017-05-18: qty 1000

## 2017-05-22 ENCOUNTER — Inpatient Hospital Stay: Payer: Medicare PPO

## 2017-05-22 VITALS — BP 127/66 | HR 90 | Resp 18

## 2017-05-22 DIAGNOSIS — D508 Other iron deficiency anemias: Secondary | ICD-10-CM

## 2017-05-22 DIAGNOSIS — D509 Iron deficiency anemia, unspecified: Secondary | ICD-10-CM | POA: Diagnosis not present

## 2017-05-22 MED ORDER — SODIUM CHLORIDE 0.9 % IV SOLN
Freq: Once | INTRAVENOUS | Status: AC
  Administered 2017-05-22: 14:00:00 via INTRAVENOUS
  Filled 2017-05-22: qty 1000

## 2017-05-22 MED ORDER — SODIUM CHLORIDE 0.9 % IV SOLN
510.0000 mg | Freq: Once | INTRAVENOUS | Status: AC
  Administered 2017-05-22: 510 mg via INTRAVENOUS
  Filled 2017-05-22: qty 17

## 2017-05-25 ENCOUNTER — Ambulatory Visit: Payer: Medicare PPO

## 2017-07-20 ENCOUNTER — Inpatient Hospital Stay: Payer: Medicare PPO

## 2017-07-20 ENCOUNTER — Inpatient Hospital Stay: Payer: Medicare PPO | Admitting: Oncology

## 2017-07-27 NOTE — Progress Notes (Signed)
[place]  Telephone:(336) [phone] Fax:(336) [phone]  ID: Lisa Mcneil OB: 06-12-1928  MR#: [id number]  QBV#:[id number]  Patient Care Team: Dion Body, MD as PCP - General (Family Medicine)  CHIEF COMPLAINT:  Iron deficiency anemia.  INTERVAL HISTORY: Patient returns to clinic today for repeat laboratory work and further evaluation.  She has chronic fatigue, but otherwise feels well. She has no neurologic complaints. She denies any recent fevers or illnesses. She has a good appetite and denies weight loss. She has no chest pain or shortness of breath. She denies any nausea, vomiting, consultation, or diarrhea. She has no melena or hematochezia.  She has no urinary complaints.  Patient offers no further specific complaints today.  REVIEW OF SYSTEMS:   Review of Systems  Constitutional: Positive for malaise/fatigue. Negative for fever and weight loss.  Respiratory: Negative.  Negative for cough and shortness of breath.   Cardiovascular: Negative.  Negative for chest pain and leg swelling.  Gastrointestinal: Negative.  Negative for abdominal pain, blood in stool and melena.  Genitourinary: Negative.  Negative for hematuria.  Musculoskeletal: Negative.   Skin: Negative.  Negative for rash.  Neurological: Negative.  Negative for weakness.  Psychiatric/Behavioral: Negative.  The patient is not nervous/anxious.     As per HPI. Otherwise, a complete review of systems is negative.  PAST MEDICAL HISTORY: Past Medical History:  Diagnosis Date  . Diabetes mellitus without complication ([place])   . GERD (gastroesophageal reflux disease)   . Hypertension   . Insomnia   . Iron deficiency anemia   . RLS (restless legs syndrome)     PAST SURGICAL HISTORY: Past Surgical History:  Procedure Laterality Date  . BLADDER REPAIR    . CATARACT EXTRACTION W/PHACO Right 05/13/2016   Procedure: CATARACT EXTRACTION PHACO AND INTRAOCULAR LENS PLACEMENT (IOC);  Surgeon:  Birder Robson, MD;  Location: [facility] ORS;  Service: Ophthalmology;  Laterality: Right;  [place] 1.09 AP% 21.0 CDE 14.60 Fluid Pack Lot # [id number]  . CATARACT EXTRACTION W/PHACO Left 07/15/2016   Procedure: CATARACT EXTRACTION PHACO AND INTRAOCULAR LENS PLACEMENT (IOC);  Surgeon: Birder Robson, MD;  Location: [facility] ORS;  Service: Ophthalmology;  Laterality: Left;  Lot# [id number] H [place]: 01:17.9 AP%:27.8 CDE: 21.67     FAMILY HISTORY: Reviewed and unchanged. No reported history of malignancy or chronic disease.     ADVANCED DIRECTIVES:    HEALTH MAINTENANCE: Social History   Tobacco Use  . Smoking status: Former Research scientist (life sciences)  . Smokeless tobacco: Never Used  Substance Use Topics  . Alcohol use: No  . Drug use: No     Colonoscopy:  PAP:  Bone density:  Lipid panel:  Allergies  Allergen Reactions  . Codeine   . Demerol [Meperidine]   . Polysaccharide Iron Complex Other (See Comments)    Caused constipation  . Naproxen Rash    Caused rash on mouth    Current Outpatient Medications  Medication Sig Dispense Refill  . ACCU-CHEK FASTCLIX LANCETS MISC     . ACCU-CHEK SMARTVIEW test strip     . Atorvastatin Calcium (LIPITOR PO) Take by mouth daily.    Marland Kitchen lisinopril-hydrochlorothiazide (PRINZIDE,ZESTORETIC) 10-12.5 MG per tablet Take 1 tablet by mouth daily.    . metFORMIN (GLUCOPHAGE) 1000 MG tablet Take 1,000 mg by mouth 2 (two) times daily with a meal.     . Multiple Vitamin (MULTIVITAMIN) capsule Take 1 capsule by mouth daily.    Marland Kitchen omeprazole (PRILOSEC) 20 MG capsule Take 20 mg by mouth daily.    Marland Kitchen  traZODone (DESYREL) 50 MG tablet Take by mouth.    . vitamin E (VITAMIN E) 400 UNIT capsule Take 400 Units by mouth daily.    Marland Kitchen amLODipine (NORVASC) 10 MG tablet Take 10 mg by mouth daily.    Marland Kitchen glimepiride (AMARYL) 2 MG tablet TAKE 1/2 TABLET DAILY AS NEEDED IF RANDOM GLUCOSE IS GREATER THAN 200 OR FASTING BLOOD SUGAR IS SUSTAINED GREATER THAN 130     No current facility-administered  medications for this visit.     OBJECTIVE: Vitals:   07/29/17 1437  BP: 119/67  Pulse: 78  Resp: 18  Temp: (!) 97.1 F (36.2 C)     Body mass index is 27.29 kg/m.    ECOG FS:0 - Asymptomatic  General: Well-developed, well-nourished, no acute distress. Eyes: Pink conjunctiva, anicteric sclera. Lungs: Clear to auscultation bilaterally. Heart: Regular rate and rhythm. No rubs, murmurs, or gallops. Abdomen: Soft, nontender, nondistended. No organomegaly noted, normoactive bowel sounds. Musculoskeletal: No edema, cyanosis, or clubbing. Neuro: Alert, answering all questions appropriately. Cranial nerves grossly intact. Skin: No rashes or petechiae noted. Psych: Normal affect.   LAB RESULTS:  Lab Results  Component Value Date   NA 135 02/17/2015   K 4.5 02/17/2015   CL 102 02/17/2015   CO2 25 02/17/2015   GLUCOSE 178 (H) 02/17/2015   BUN 16 02/17/2015   CREATININE 0.91 02/17/2015   CALCIUM 9.4 02/17/2015   PROT 7.8 02/16/2015   ALBUMIN 4.0 02/16/2015   AST 27 02/16/2015   ALT 18 02/16/2015   ALKPHOS 56 02/16/2015   BILITOT 0.3 02/16/2015   GFRNONAA 56 (L) 02/17/2015   GFRAA >60 02/17/2015    Lab Results  Component Value Date   WBC 5.9 07/29/2017   NEUTROABS 3.7 07/29/2017   HGB 11.1 (L) 07/29/2017   HCT 33.7 (L) 07/29/2017   MCV 90.6 07/29/2017   PLT 242 07/29/2017   Lab Results  Component Value Date   IRON 45 07/29/2017   TIBC 326 07/29/2017   IRONPCTSAT 14 07/29/2017   Lab Results  Component Value Date   FERRITIN 28 07/29/2017     STUDIES: No results found.  ASSESSMENT: Iron deficiency anemia.  PLAN:    1. Iron deficiency anemia: Patient's hemoglobin has significantly improved and is now greater than 11.0.  Her iron stores are now within normal limits.  Previously, the remainder of her blood work was also either negative or within normal limits.  She last received IV Feraheme on May 22, 2017.  No intervention is needed at this time. Return to  clinic in 3 months with repeat laboratory work and further evaluation.  2. Hypertension: Patient's blood pressure is within normal limits today, continue current medications and treatment per PCP.   Approximately 20 minutes was spent in discussion of which greater than 50% was consultation.  Patient expressed understanding and was in agreement with this plan. She also understands that She can call clinic at any time with any questions, concerns, or complaints.    Lloyd Huger, MD   08/01/2017 9:42 AM

## 2017-07-29 ENCOUNTER — Inpatient Hospital Stay (HOSPITAL_BASED_OUTPATIENT_CLINIC_OR_DEPARTMENT_OTHER): Payer: Medicare PPO | Admitting: Oncology

## 2017-07-29 ENCOUNTER — Other Ambulatory Visit: Payer: Self-pay

## 2017-07-29 ENCOUNTER — Inpatient Hospital Stay: Payer: Medicare PPO

## 2017-07-29 ENCOUNTER — Inpatient Hospital Stay: Payer: Medicare PPO | Attending: Oncology

## 2017-07-29 VITALS — BP 119/67 | HR 78 | Temp 97.1°F | Resp 18 | Wt 149.2 lb

## 2017-07-29 DIAGNOSIS — Z79899 Other long term (current) drug therapy: Secondary | ICD-10-CM | POA: Diagnosis not present

## 2017-07-29 DIAGNOSIS — K219 Gastro-esophageal reflux disease without esophagitis: Secondary | ICD-10-CM | POA: Diagnosis not present

## 2017-07-29 DIAGNOSIS — D508 Other iron deficiency anemias: Secondary | ICD-10-CM

## 2017-07-29 DIAGNOSIS — I10 Essential (primary) hypertension: Secondary | ICD-10-CM

## 2017-07-29 DIAGNOSIS — E119 Type 2 diabetes mellitus without complications: Secondary | ICD-10-CM | POA: Insufficient documentation

## 2017-07-29 DIAGNOSIS — Z7984 Long term (current) use of oral hypoglycemic drugs: Secondary | ICD-10-CM | POA: Diagnosis not present

## 2017-07-29 DIAGNOSIS — G4700 Insomnia, unspecified: Secondary | ICD-10-CM | POA: Diagnosis not present

## 2017-07-29 DIAGNOSIS — D509 Iron deficiency anemia, unspecified: Secondary | ICD-10-CM | POA: Diagnosis present

## 2017-07-29 DIAGNOSIS — Z87891 Personal history of nicotine dependence: Secondary | ICD-10-CM | POA: Insufficient documentation

## 2017-07-29 DIAGNOSIS — G2581 Restless legs syndrome: Secondary | ICD-10-CM | POA: Diagnosis not present

## 2017-07-29 LAB — CBC WITH DIFFERENTIAL/PLATELET
Basophils Absolute: 0.1 10*3/uL (ref 0–0.1)
Basophils Relative: 1 %
Eosinophils Absolute: 0.3 10*3/uL (ref 0–0.7)
Eosinophils Relative: 5 %
HCT: 33.7 % — ABNORMAL LOW (ref 35.0–47.0)
Hemoglobin: 11.1 g/dL — ABNORMAL LOW (ref 12.0–16.0)
Lymphocytes Relative: 25 %
Lymphs Abs: 1.5 10*3/uL (ref 1.0–3.6)
MCH: 29.9 pg (ref 26.0–34.0)
MCHC: 33 g/dL (ref 32.0–36.0)
MCV: 90.6 fL (ref 80.0–100.0)
Monocytes Absolute: 0.4 10*3/uL (ref 0.2–0.9)
Monocytes Relative: 6 %
Neutro Abs: 3.7 10*3/uL (ref 1.4–6.5)
Neutrophils Relative %: 63 %
Platelets: 242 10*3/uL (ref 150–440)
RBC: 3.72 MIL/uL — ABNORMAL LOW (ref 3.80–5.20)
RDW: 19.4 % — ABNORMAL HIGH (ref 11.5–14.5)
WBC: 5.9 10*3/uL (ref 3.6–11.0)

## 2017-07-29 LAB — FERRITIN: Ferritin: 28 ng/mL (ref 11–307)

## 2017-07-29 LAB — IRON AND TIBC
Iron: 45 ug/dL (ref 28–170)
Saturation Ratios: 14 % (ref 10.4–31.8)
TIBC: 326 ug/dL (ref 250–450)
UIBC: 281 ug/dL

## 2017-07-29 NOTE — Progress Notes (Signed)
Here for follow up feeling tired she stated.

## 2017-10-29 ENCOUNTER — Ambulatory Visit: Payer: Medicare PPO | Admitting: Oncology

## 2017-10-29 ENCOUNTER — Ambulatory Visit: Payer: Medicare PPO

## 2017-10-29 ENCOUNTER — Other Ambulatory Visit: Payer: Medicare PPO

## 2017-11-01 NOTE — Progress Notes (Signed)
[place]  [phone] [phone]  ID: Lisa Mcneil OB: 1928/08/04  MR#: [id number]  [id number]#:[id number]  Patient Care Team: Dion Body, MD as PCP - General (Family Medicine)  CHIEF COMPLAINT:  Iron deficiency anemia.  INTERVAL HISTORY: Patient returns to clinic today for repeat laboratory work and further evaluation.  She currently feels well and is asymptomatic.  She admits to chronic fatigue, but this is unchanged. She has no neurologic complaints. She denies any recent fevers or illnesses. She has a good appetite and denies weight loss. She has no chest pain or shortness of breath. She denies any nausea, vomiting, consultation, or diarrhea. She has no melena or hematochezia.  She has no urinary complaints.  Patient offers no further specific complaints today.  REVIEW OF SYSTEMS:   Review of Systems  Constitutional: Positive for malaise/fatigue. Negative for fever and weight loss.  Respiratory: Negative.  Negative for cough and shortness of breath.   Cardiovascular: Negative.  Negative for chest pain and leg swelling.  Gastrointestinal: Negative.  Negative for abdominal pain, blood in stool and melena.  Genitourinary: Negative.  Negative for hematuria.  Musculoskeletal: Negative.   Skin: Negative.  Negative for rash.  Neurological: Negative.  Negative for sensory change, focal weakness and weakness.  Psychiatric/Behavioral: Negative.  The patient is not nervous/anxious.     As per HPI. Otherwise, a complete review of systems is negative.  PAST MEDICAL HISTORY: Past Medical History:  Diagnosis Date  . Diabetes mellitus without complication ([place])   . GERD (gastroesophageal reflux disease)   . Hypertension   . Insomnia   . Iron deficiency anemia   . RLS (restless legs syndrome)     PAST SURGICAL HISTORY: Past Surgical History:  Procedure Laterality Date  . BLADDER REPAIR    . CATARACT EXTRACTION W/PHACO Right 05/13/2016   Procedure: CATARACT EXTRACTION PHACO AND INTRAOCULAR LENS PLACEMENT (IOC);  Surgeon: Birder Robson, MD;  Location: [facility] ORS;  Service: Ophthalmology;  Laterality: Right;  [place] 1.09 AP% 21.0 CDE 14.60 Fluid Pack Lot # [id number]  . CATARACT EXTRACTION W/PHACO Left 07/15/2016   Procedure: CATARACT EXTRACTION PHACO AND INTRAOCULAR LENS PLACEMENT (IOC);  Surgeon: Birder Robson, MD;  Location: [facility] ORS;  Service: Ophthalmology;  Laterality: Left;  Lot# [id number] H [place]: 01:17.9 AP%:27.8 CDE: 21.67     FAMILY HISTORY: Reviewed and unchanged. No reported history of malignancy or chronic disease.     ADVANCED DIRECTIVES:    HEALTH MAINTENANCE: Social History   Tobacco Use  . Smoking status: Former Research scientist (life sciences)  . Smokeless tobacco: Never Used  Substance Use Topics  . Alcohol use: No  . Drug use: No     Colonoscopy:  PAP:  Bone density:  Lipid panel:  Allergies  Allergen Reactions  . Codeine   . Demerol [Meperidine]   . Polysaccharide Iron Complex Other (See Comments)    Caused constipation  . Naproxen Rash    Caused rash on mouth    Current Outpatient Medications  Medication Sig Dispense Refill  . ACCU-CHEK FASTCLIX LANCETS MISC     . ACCU-CHEK SMARTVIEW test strip     . amLODipine (NORVASC) 10 MG tablet Take 10 mg by mouth daily.    . Atorvastatin Calcium (LIPITOR PO) Take by mouth daily.    Marland Kitchen glimepiride (AMARYL) 2 MG tablet TAKE 1/2 TABLET DAILY AS NEEDED IF RANDOM GLUCOSE IS GREATER THAN 200 OR FASTING BLOOD SUGAR IS SUSTAINED GREATER THAN 130    . lisinopril-hydrochlorothiazide (PRINZIDE,ZESTORETIC) 10-12.5 MG per tablet Take 1  tablet by mouth daily.    . metFORMIN (GLUCOPHAGE) 1000 MG tablet Take 1,000 mg by mouth 2 (two) times daily with a meal.     . Multiple Vitamin (MULTIVITAMIN) capsule Take 1 capsule by mouth daily.    Marland Kitchen omeprazole (PRILOSEC) 20 MG capsule Take 20 mg by mouth daily.    . traZODone (DESYREL) 50 MG tablet Take by mouth.    . vitamin E (VITAMIN E) 400  UNIT capsule Take 400 Units by mouth daily.     No current facility-administered medications for this visit.     OBJECTIVE: Vitals:   11/05/17 1407 11/05/17 1446  BP: (!) 154/66 125/72  Pulse: 87   Temp: 97.7 F (36.5 C)      Body mass index is 26.78 kg/m.    ECOG FS:0 - Asymptomatic  General: Well-developed, well-nourished, no acute distress. Eyes: Pink conjunctiva, anicteric sclera. Lungs: Clear to auscultation bilaterally. Heart: Regular rate and rhythm. No rubs, murmurs, or gallops. Abdomen: Soft, nontender, nondistended. No organomegaly noted, normoactive bowel sounds. Musculoskeletal: No edema, cyanosis, or clubbing. Neuro: Alert, answering all questions appropriately. Cranial nerves grossly intact. Skin: No rashes or petechiae noted. Psych: Normal affect.   LAB RESULTS:  Lab Results  Component Value Date   NA 135 02/17/2015   K 4.5 02/17/2015   CL 102 02/17/2015   CO2 25 02/17/2015   GLUCOSE 178 (H) 02/17/2015   BUN 16 02/17/2015   CREATININE 0.91 02/17/2015   CALCIUM 9.4 02/17/2015   PROT 7.8 02/16/2015   ALBUMIN 4.0 02/16/2015   AST 27 02/16/2015   ALT 18 02/16/2015   ALKPHOS 56 02/16/2015   BILITOT 0.3 02/16/2015   GFRNONAA 56 (L) 02/17/2015   GFRAA >60 02/17/2015    Lab Results  Component Value Date   WBC 6.4 11/05/2017   NEUTROABS 4.2 11/05/2017   HGB 11.7 (L) 11/05/2017   HCT 34.5 (L) 11/05/2017   MCV 91.6 11/05/2017   PLT 255 11/05/2017   Lab Results  Component Value Date   IRON 69 11/05/2017   TIBC 361 11/05/2017   IRONPCTSAT 19 11/05/2017   Lab Results  Component Value Date   FERRITIN 10 (L) 11/05/2017     STUDIES: No results found.  ASSESSMENT: Iron deficiency anemia.  PLAN:    1. Iron deficiency anemia: Patient's hemoglobin continues to improve and is now 11.7.  Her iron stores have trended down slightly.  Previously, the remainder of her blood work was also either negative or within normal limits.  She last received IV  Feraheme on May 22, 2017.  No intervention is needed at this time, but patient will likely require IV Feraheme at her next clinic visit. Return to clinic in 6 months with repeat laboratory work and further evaluation.  2. Hypertension: Patient's blood pressure is mildly elevated today, continue current medications and treatment per PCP.   Approximately 20 minutes was spent in discussion of which greater than 50% was consultation.  Patient expressed understanding and was in agreement with this plan. She also understands that She can call clinic at any time with any questions, concerns, or complaints.    Lloyd Huger, MD   11/09/2017 8:34 PM

## 2017-11-04 ENCOUNTER — Other Ambulatory Visit: Payer: Self-pay | Admitting: *Deleted

## 2017-11-04 DIAGNOSIS — D649 Anemia, unspecified: Secondary | ICD-10-CM

## 2017-11-05 ENCOUNTER — Other Ambulatory Visit: Payer: Self-pay

## 2017-11-05 ENCOUNTER — Inpatient Hospital Stay: Payer: Medicare PPO

## 2017-11-05 ENCOUNTER — Encounter: Payer: Self-pay | Admitting: Oncology

## 2017-11-05 ENCOUNTER — Inpatient Hospital Stay (HOSPITAL_BASED_OUTPATIENT_CLINIC_OR_DEPARTMENT_OTHER): Payer: Medicare PPO | Admitting: Oncology

## 2017-11-05 ENCOUNTER — Inpatient Hospital Stay: Payer: Medicare PPO | Attending: Oncology

## 2017-11-05 VITALS — BP 125/72 | HR 87 | Temp 97.7°F | Wt 146.4 lb

## 2017-11-05 DIAGNOSIS — D508 Other iron deficiency anemias: Secondary | ICD-10-CM

## 2017-11-05 DIAGNOSIS — D509 Iron deficiency anemia, unspecified: Secondary | ICD-10-CM | POA: Diagnosis present

## 2017-11-05 DIAGNOSIS — E611 Iron deficiency: Secondary | ICD-10-CM | POA: Insufficient documentation

## 2017-11-05 DIAGNOSIS — E119 Type 2 diabetes mellitus without complications: Secondary | ICD-10-CM | POA: Insufficient documentation

## 2017-11-05 DIAGNOSIS — E782 Mixed hyperlipidemia: Secondary | ICD-10-CM | POA: Insufficient documentation

## 2017-11-05 DIAGNOSIS — Z7984 Long term (current) use of oral hypoglycemic drugs: Secondary | ICD-10-CM | POA: Insufficient documentation

## 2017-11-05 DIAGNOSIS — I10 Essential (primary) hypertension: Secondary | ICD-10-CM | POA: Insufficient documentation

## 2017-11-05 DIAGNOSIS — Z87891 Personal history of nicotine dependence: Secondary | ICD-10-CM | POA: Diagnosis not present

## 2017-11-05 DIAGNOSIS — D649 Anemia, unspecified: Secondary | ICD-10-CM

## 2017-11-05 LAB — IRON AND TIBC
Iron: 69 ug/dL (ref 28–170)
Saturation Ratios: 19 % (ref 10.4–31.8)
TIBC: 361 ug/dL (ref 250–450)
UIBC: 292 ug/dL

## 2017-11-05 LAB — CBC WITH DIFFERENTIAL/PLATELET
Basophils Absolute: 0.1 10*3/uL (ref 0–0.1)
Basophils Relative: 1 %
Eosinophils Absolute: 0.2 10*3/uL (ref 0–0.7)
Eosinophils Relative: 3 %
HCT: 34.5 % — ABNORMAL LOW (ref 35.0–47.0)
Hemoglobin: 11.7 g/dL — ABNORMAL LOW (ref 12.0–16.0)
Lymphocytes Relative: 24 %
Lymphs Abs: 1.5 10*3/uL (ref 1.0–3.6)
MCH: 31.1 pg (ref 26.0–34.0)
MCHC: 33.9 g/dL (ref 32.0–36.0)
MCV: 91.6 fL (ref 80.0–100.0)
Monocytes Absolute: 0.4 10*3/uL (ref 0.2–0.9)
Monocytes Relative: 6 %
Neutro Abs: 4.2 10*3/uL (ref 1.4–6.5)
Neutrophils Relative %: 66 %
Platelets: 255 10*3/uL (ref 150–440)
RBC: 3.76 MIL/uL — ABNORMAL LOW (ref 3.80–5.20)
RDW: 13.3 % (ref 11.5–14.5)
WBC: 6.4 10*3/uL (ref 3.6–11.0)

## 2017-11-05 LAB — FERRITIN: Ferritin: 10 ng/mL — ABNORMAL LOW (ref 11–307)

## 2018-04-20 DIAGNOSIS — N183 Chronic kidney disease, stage 3 unspecified: Secondary | ICD-10-CM | POA: Insufficient documentation

## 2018-04-20 DIAGNOSIS — N184 Chronic kidney disease, stage 4 (severe): Secondary | ICD-10-CM | POA: Insufficient documentation

## 2018-05-10 NOTE — Progress Notes (Signed)
[place]  Telephone:(336) [phone] [phone]) [phone]  ID: Lisa Mcneil OB: 03-17-1928  MR#: [id number]  [id number]#:[id number]  Patient Care Team: Dion Body, MD as PCP - General (Family Medicine)  CHIEF COMPLAINT:  Iron deficiency anemia.  INTERVAL HISTORY: Patient returns to clinic today for repeat laboratory work, further evaluation, consideration of additional IV Feraheme.  She currently feels well and is asymptomatic.  She does not complain of any weakness or fatigue. She has no neurologic complaints. She denies any recent fevers or illnesses. She has a good appetite and denies weight loss. She has no chest pain or shortness of breath. She denies any nausea, vomiting, consultation, or diarrhea. She has no melena or hematochezia.  She has no urinary complaints.  Patient is at her baseline offers no specific complaints today.  REVIEW OF SYSTEMS:   Review of Systems  Constitutional: Negative.  Negative for fever, malaise/fatigue and weight loss.  Respiratory: Negative.  Negative for cough and shortness of breath.   Cardiovascular: Negative.  Negative for chest pain and leg swelling.  Gastrointestinal: Negative.  Negative for abdominal pain, blood in stool and melena.  Genitourinary: Negative.  Negative for hematuria.  Musculoskeletal: Negative.  Negative for back pain.  Skin: Negative.  Negative for rash.  Neurological: Negative.  Negative for sensory change, focal weakness and weakness.  Psychiatric/Behavioral: Negative.  The patient is not nervous/anxious.     As per HPI. Otherwise, a complete review of systems is negative.  PAST MEDICAL HISTORY: Past Medical History:  Diagnosis Date  . Diabetes mellitus without complication ([place])   . GERD (gastroesophageal reflux disease)   . Hypertension   . Insomnia   . Iron deficiency anemia   . RLS (restless legs syndrome)     PAST SURGICAL HISTORY: Past Surgical History:  Procedure Laterality Date  .  BLADDER REPAIR    . CATARACT EXTRACTION W/PHACO Right 05/13/2016   Procedure: CATARACT EXTRACTION PHACO AND INTRAOCULAR LENS PLACEMENT (IOC);  Surgeon: Birder Robson, MD;  Location: [facility] ORS;  Service: Ophthalmology;  Laterality: Right;  [place] 1.09 AP% 21.0 CDE 14.60 Fluid Pack Lot # [id number]  . CATARACT EXTRACTION W/PHACO Left 07/15/2016   Procedure: CATARACT EXTRACTION PHACO AND INTRAOCULAR LENS PLACEMENT (IOC);  Surgeon: Birder Robson, MD;  Location: [facility] ORS;  Service: Ophthalmology;  Laterality: Left;  Lot# [id number] H [place]: 01:17.9 AP%:27.8 CDE: 21.67     FAMILY HISTORY: Reviewed and unchanged. No reported history of malignancy or chronic disease.     ADVANCED DIRECTIVES:    HEALTH MAINTENANCE: Social History   Tobacco Use  . Smoking status: Former Research scientist (life sciences)  . Smokeless tobacco: Never Used  Substance Use Topics  . Alcohol use: No  . Drug use: No     Colonoscopy:  PAP:  Bone density:  Lipid panel:  Allergies  Allergen Reactions  . Codeine   . Demerol [Meperidine]   . Polysaccharide Iron Complex Other (See Comments)    Caused constipation  . Naproxen Rash    Caused rash on mouth    Current Outpatient Medications  Medication Sig Dispense Refill  . amLODipine (NORVASC) 10 MG tablet Take 10 mg by mouth daily.    Marland Kitchen glimepiride (AMARYL) 2 MG tablet TAKE 1/2 TABLET DAILY AS NEEDED IF RANDOM GLUCOSE IS GREATER THAN 200 OR FASTING BLOOD SUGAR IS SUSTAINED GREATER THAN 130    . lisinopril-hydrochlorothiazide (PRINZIDE,ZESTORETIC) 10-12.5 MG per tablet Take 1 tablet by mouth daily.    . metFORMIN (GLUCOPHAGE) 1000 MG tablet Take 1,000 mg by mouth  2 (two) times daily with a meal.     . Multiple Vitamin (MULTIVITAMIN) capsule Take 1 capsule by mouth daily.    Marland Kitchen omeprazole (PRILOSEC) 20 MG capsule Take 20 mg by mouth daily.    . traZODone (DESYREL) 50 MG tablet TAKE 1 TABLET BY MOUTH NIGHTLY    . vitamin E (VITAMIN E) 400 UNIT capsule Take 400 Units by mouth daily.    Marland Kitchen  ACCU-CHEK FASTCLIX LANCETS MISC     . ACCU-CHEK SMARTVIEW test strip      No current facility-administered medications for this visit.     OBJECTIVE: Vitals:   05/13/18 1420  BP: (!) 142/59  Pulse: 78  Resp: 18  Temp: (!) 96.7 F (35.9 C)     Body mass index is 27.22 kg/m.    ECOG FS:0 - Asymptomatic  General: Well-developed, well-nourished, no acute distress. Eyes: Pink conjunctiva, anicteric sclera. HEENT: Normocephalic, moist mucous membranes. Lungs: Clear to auscultation bilaterally. Heart: Regular rate and rhythm. No rubs, murmurs, or gallops. Abdomen: Soft, nontender, nondistended. No organomegaly noted, normoactive bowel sounds. Musculoskeletal: No edema, cyanosis, or clubbing. Neuro: Alert, answering all questions appropriately. Cranial nerves grossly intact. Skin: No rashes or petechiae noted. Psych: Normal affect.  LAB RESULTS:  Lab Results  Component Value Date   NA 135 02/17/2015   K 4.5 02/17/2015   CL 102 02/17/2015   CO2 25 02/17/2015   GLUCOSE 178 (H) 02/17/2015   BUN 16 02/17/2015   CREATININE 0.91 02/17/2015   CALCIUM 9.4 02/17/2015   PROT 7.8 02/16/2015   ALBUMIN 4.0 02/16/2015   AST 27 02/16/2015   ALT 18 02/16/2015   ALKPHOS 56 02/16/2015   BILITOT 0.3 02/16/2015   GFRNONAA 56 (L) 02/17/2015   GFRAA >60 02/17/2015    Lab Results  Component Value Date   WBC 6.1 05/13/2018   NEUTROABS 4.0 05/13/2018   HGB 10.4 (L) 05/13/2018   HCT 31.2 (L) 05/13/2018   MCV 92.3 05/13/2018   PLT 224 05/13/2018   Lab Results  Component Value Date   IRON 40 05/13/2018   TIBC 367 05/13/2018   IRONPCTSAT 11 05/13/2018   Lab Results  Component Value Date   FERRITIN 6 (L) 05/13/2018     STUDIES: No results found.  ASSESSMENT: Iron deficiency anemia.  PLAN:    1. Iron deficiency anemia: Patient's hemoglobin and iron stores have trended down slightly, but she remains asymptomatic.  We discussed the possibility of receiving IV Feraheme today, but  patient declined.  She expressed understanding that she will likely need treatment at her next clinic visit.   Previously, the remainder of her blood work was also either negative or within normal limits.  She last received IV Feraheme on May 22, 2017.  Return to clinic in 6 months with repeat laboratory work and further evaluation.   2. Hypertension: Patient's blood pressure remains mildly elevated.  Continue monitoring and treatment per primary care.   Patient expressed understanding and was in agreement with this plan. She also understands that She can call clinic at any time with any questions, concerns, or complaints.    Lloyd Huger, MD   05/16/2018 8:56 AM

## 2018-05-12 ENCOUNTER — Other Ambulatory Visit: Payer: Self-pay | Admitting: *Deleted

## 2018-05-12 DIAGNOSIS — D649 Anemia, unspecified: Secondary | ICD-10-CM

## 2018-05-13 ENCOUNTER — Other Ambulatory Visit: Payer: Self-pay

## 2018-05-13 ENCOUNTER — Inpatient Hospital Stay (HOSPITAL_BASED_OUTPATIENT_CLINIC_OR_DEPARTMENT_OTHER): Payer: Medicare PPO | Admitting: Oncology

## 2018-05-13 ENCOUNTER — Inpatient Hospital Stay: Payer: Medicare PPO | Attending: Oncology

## 2018-05-13 ENCOUNTER — Inpatient Hospital Stay: Payer: Medicare PPO

## 2018-05-13 VITALS — BP 142/59 | HR 78 | Temp 96.7°F | Resp 18 | Wt 148.8 lb

## 2018-05-13 DIAGNOSIS — Z87891 Personal history of nicotine dependence: Secondary | ICD-10-CM | POA: Insufficient documentation

## 2018-05-13 DIAGNOSIS — E119 Type 2 diabetes mellitus without complications: Secondary | ICD-10-CM | POA: Diagnosis not present

## 2018-05-13 DIAGNOSIS — I10 Essential (primary) hypertension: Secondary | ICD-10-CM | POA: Diagnosis not present

## 2018-05-13 DIAGNOSIS — D649 Anemia, unspecified: Secondary | ICD-10-CM

## 2018-05-13 DIAGNOSIS — Z7984 Long term (current) use of oral hypoglycemic drugs: Secondary | ICD-10-CM | POA: Insufficient documentation

## 2018-05-13 DIAGNOSIS — D509 Iron deficiency anemia, unspecified: Secondary | ICD-10-CM | POA: Diagnosis not present

## 2018-05-13 DIAGNOSIS — Z79899 Other long term (current) drug therapy: Secondary | ICD-10-CM | POA: Diagnosis not present

## 2018-05-13 LAB — CBC WITH DIFFERENTIAL/PLATELET
Basophils Absolute: 0.1 10*3/uL (ref 0–0.1)
Basophils Relative: 1 %
Eosinophils Absolute: 0.2 10*3/uL (ref 0–0.7)
Eosinophils Relative: 3 %
HCT: 31.2 % — ABNORMAL LOW (ref 35.0–47.0)
Hemoglobin: 10.4 g/dL — ABNORMAL LOW (ref 12.0–16.0)
Lymphocytes Relative: 25 %
Lymphs Abs: 1.5 10*3/uL (ref 1.0–3.6)
MCH: 30.8 pg (ref 26.0–34.0)
MCHC: 33.4 g/dL (ref 32.0–36.0)
MCV: 92.3 fL (ref 80.0–100.0)
Monocytes Absolute: 0.4 10*3/uL (ref 0.2–0.9)
Monocytes Relative: 6 %
Neutro Abs: 4 10*3/uL (ref 1.4–6.5)
Neutrophils Relative %: 65 %
Platelets: 224 10*3/uL (ref 150–440)
RBC: 3.38 MIL/uL — ABNORMAL LOW (ref 3.80–5.20)
RDW: 14.6 % — ABNORMAL HIGH (ref 11.5–14.5)
WBC: 6.1 10*3/uL (ref 3.6–11.0)

## 2018-05-13 LAB — IRON AND TIBC
Iron: 40 ug/dL (ref 28–170)
Saturation Ratios: 11 % (ref 10.4–31.8)
TIBC: 367 ug/dL (ref 250–450)
UIBC: 327 ug/dL

## 2018-05-13 LAB — FERRITIN: Ferritin: 6 ng/mL — ABNORMAL LOW (ref 11–307)

## 2018-05-13 NOTE — Progress Notes (Signed)
Here for follow up " I thank God everyday -I feel fantastic" per  pt

## 2018-08-18 ENCOUNTER — Other Ambulatory Visit (HOSPITAL_COMMUNITY): Payer: Self-pay | Admitting: Physician Assistant

## 2018-08-18 ENCOUNTER — Other Ambulatory Visit: Payer: Self-pay | Admitting: Physician Assistant

## 2018-08-18 DIAGNOSIS — S32050A Wedge compression fracture of fifth lumbar vertebra, initial encounter for closed fracture: Secondary | ICD-10-CM

## 2018-08-18 DIAGNOSIS — M545 Low back pain, unspecified: Secondary | ICD-10-CM

## 2018-08-23 DIAGNOSIS — I10 Essential (primary) hypertension: Secondary | ICD-10-CM | POA: Diagnosis not present

## 2018-08-23 DIAGNOSIS — E119 Type 2 diabetes mellitus without complications: Secondary | ICD-10-CM | POA: Diagnosis not present

## 2018-08-23 DIAGNOSIS — N814 Uterovaginal prolapse, unspecified: Secondary | ICD-10-CM | POA: Diagnosis not present

## 2018-08-23 DIAGNOSIS — E782 Mixed hyperlipidemia: Secondary | ICD-10-CM | POA: Diagnosis not present

## 2018-08-23 DIAGNOSIS — N183 Chronic kidney disease, stage 3 (moderate): Secondary | ICD-10-CM | POA: Diagnosis not present

## 2018-08-27 DIAGNOSIS — Z4689 Encounter for fitting and adjustment of other specified devices: Secondary | ICD-10-CM | POA: Diagnosis not present

## 2018-08-27 DIAGNOSIS — N814 Uterovaginal prolapse, unspecified: Secondary | ICD-10-CM | POA: Diagnosis not present

## 2018-08-30 DIAGNOSIS — L03116 Cellulitis of left lower limb: Secondary | ICD-10-CM | POA: Diagnosis not present

## 2018-08-30 DIAGNOSIS — I10 Essential (primary) hypertension: Secondary | ICD-10-CM | POA: Diagnosis not present

## 2018-08-30 DIAGNOSIS — E785 Hyperlipidemia, unspecified: Secondary | ICD-10-CM | POA: Diagnosis not present

## 2018-08-30 DIAGNOSIS — E782 Mixed hyperlipidemia: Secondary | ICD-10-CM | POA: Diagnosis not present

## 2018-08-30 DIAGNOSIS — Z0000 Encounter for general adult medical examination without abnormal findings: Secondary | ICD-10-CM | POA: Diagnosis not present

## 2018-08-30 DIAGNOSIS — E1169 Type 2 diabetes mellitus with other specified complication: Secondary | ICD-10-CM | POA: Diagnosis not present

## 2018-08-30 DIAGNOSIS — N183 Chronic kidney disease, stage 3 (moderate): Secondary | ICD-10-CM | POA: Diagnosis not present

## 2018-09-01 ENCOUNTER — Ambulatory Visit
Admission: RE | Admit: 2018-09-01 | Discharge: 2018-09-01 | Disposition: A | Discharge status: Home / Self Care | Payer: Medicare HMO | Source: Ambulatory Visit | Attending: Physician Assistant | Admitting: Physician Assistant

## 2018-09-01 DIAGNOSIS — M545 Low back pain, unspecified: Secondary | ICD-10-CM

## 2018-09-01 DIAGNOSIS — S32050A Wedge compression fracture of fifth lumbar vertebra, initial encounter for closed fracture: Secondary | ICD-10-CM

## 2018-09-20 DIAGNOSIS — S32050D Wedge compression fracture of fifth lumbar vertebra, subsequent encounter for fracture with routine healing: Secondary | ICD-10-CM | POA: Diagnosis not present

## 2018-09-30 DIAGNOSIS — Z4689 Encounter for fitting and adjustment of other specified devices: Secondary | ICD-10-CM | POA: Diagnosis not present

## 2018-11-17 ENCOUNTER — Other Ambulatory Visit: Payer: Medicare PPO

## 2018-11-17 ENCOUNTER — Ambulatory Visit: Payer: Medicare PPO

## 2018-11-17 ENCOUNTER — Ambulatory Visit: Payer: Medicare PPO | Admitting: Oncology

## 2018-11-18 ENCOUNTER — Ambulatory Visit: Payer: Medicare PPO | Admitting: Oncology

## 2018-11-18 ENCOUNTER — Other Ambulatory Visit: Payer: Medicare PPO

## 2018-11-18 ENCOUNTER — Ambulatory Visit: Payer: Medicare PPO

## 2018-12-22 DIAGNOSIS — E782 Mixed hyperlipidemia: Secondary | ICD-10-CM | POA: Diagnosis not present

## 2018-12-22 DIAGNOSIS — E785 Hyperlipidemia, unspecified: Secondary | ICD-10-CM | POA: Diagnosis not present

## 2018-12-22 DIAGNOSIS — E1169 Type 2 diabetes mellitus with other specified complication: Secondary | ICD-10-CM | POA: Diagnosis not present

## 2018-12-22 DIAGNOSIS — I10 Essential (primary) hypertension: Secondary | ICD-10-CM | POA: Diagnosis not present

## 2018-12-22 DIAGNOSIS — N183 Chronic kidney disease, stage 3 (moderate): Secondary | ICD-10-CM | POA: Diagnosis not present

## 2018-12-24 ENCOUNTER — Ambulatory Visit: Payer: Self-pay | Admitting: Oncology

## 2018-12-24 ENCOUNTER — Ambulatory Visit: Payer: Self-pay

## 2018-12-24 ENCOUNTER — Other Ambulatory Visit: Payer: Medicare HMO

## 2018-12-29 DIAGNOSIS — E1169 Type 2 diabetes mellitus with other specified complication: Secondary | ICD-10-CM | POA: Diagnosis not present

## 2018-12-29 DIAGNOSIS — N183 Chronic kidney disease, stage 3 (moderate): Secondary | ICD-10-CM | POA: Diagnosis not present

## 2018-12-29 DIAGNOSIS — E782 Mixed hyperlipidemia: Secondary | ICD-10-CM | POA: Diagnosis not present

## 2018-12-29 DIAGNOSIS — I10 Essential (primary) hypertension: Secondary | ICD-10-CM | POA: Diagnosis not present

## 2018-12-29 DIAGNOSIS — E785 Hyperlipidemia, unspecified: Secondary | ICD-10-CM | POA: Diagnosis not present

## 2019-01-13 DIAGNOSIS — Z4689 Encounter for fitting and adjustment of other specified devices: Secondary | ICD-10-CM | POA: Diagnosis not present

## 2019-04-15 NOTE — Progress Notes (Deleted)
[place]  Telephone:(336) [phone] [phone]) [phone]  ID: Kemiyah Sila OB: November 04, 1927  MR#: [id number]  [id number]  Patient Care Team: Dion Body, MD as PCP - General (Family Medicine)  CHIEF COMPLAINT:  Iron deficiency anemia.  INTERVAL HISTORY: Patient returns to clinic today for repeat laboratory work, further evaluation, consideration of additional IV Feraheme.  She currently feels well and is asymptomatic.  She does not complain of any weakness or fatigue. She has no neurologic complaints. She denies any recent fevers or illnesses. She has a good appetite and denies weight loss. She has no chest pain or shortness of breath. She denies any nausea, vomiting, consultation, or diarrhea. She has no melena or hematochezia.  She has no urinary complaints.  Patient is at her baseline offers no specific complaints today.  REVIEW OF SYSTEMS:   Review of Systems  Constitutional: Negative.  Negative for fever, malaise/fatigue and weight loss.  Respiratory: Negative.  Negative for cough and shortness of breath.   Cardiovascular: Negative.  Negative for chest pain and leg swelling.  Gastrointestinal: Negative.  Negative for abdominal pain, blood in stool and melena.  Genitourinary: Negative.  Negative for hematuria.  Musculoskeletal: Negative.  Negative for back pain.  Skin: Negative.  Negative for rash.  Neurological: Negative.  Negative for sensory change, focal weakness and weakness.  Psychiatric/Behavioral: Negative.  The patient is not nervous/anxious.     As per HPI. Otherwise, a complete review of systems is negative.  PAST MEDICAL HISTORY: Past Medical History:  Diagnosis Date  . Diabetes mellitus without complication ([place])   . GERD (gastroesophageal reflux disease)   . Hypertension   . Insomnia   . Iron deficiency anemia   . RLS (restless legs syndrome)     PAST SURGICAL HISTORY: Past Surgical History:  Procedure Laterality Date  .  BLADDER REPAIR    . CATARACT EXTRACTION W/PHACO Right 05/13/2016   Procedure: CATARACT EXTRACTION PHACO AND INTRAOCULAR LENS PLACEMENT (IOC);  Surgeon: Birder Robson, MD;  Location: [facility] ORS;  Service: Ophthalmology;  Laterality: Right;  [place] 1.09 AP% 21.0 CDE 14.60 Fluid Pack Lot # [id number]  . CATARACT EXTRACTION W/PHACO Left 07/15/2016   Procedure: CATARACT EXTRACTION PHACO AND INTRAOCULAR LENS PLACEMENT (IOC);  Surgeon: Birder Robson, MD;  Location: [facility] ORS;  Service: Ophthalmology;  Laterality: Left;  Lot# [id number] H [place]: 01:17.9 AP%:27.8 CDE: 21.67     FAMILY HISTORY: Reviewed and unchanged. No reported history of malignancy or chronic disease.     ADVANCED DIRECTIVES:    HEALTH MAINTENANCE: Social History   Tobacco Use  . Smoking status: Former Research scientist (life sciences)  . Smokeless tobacco: Never Used  Substance Use Topics  . Alcohol use: No  . Drug use: No     Colonoscopy:  PAP:  Bone density:  Lipid panel:  Allergies  Allergen Reactions  . Codeine   . Demerol [Meperidine]   . Polysaccharide Iron Complex Other (See Comments)    Caused constipation  . Naproxen Rash    Caused rash on mouth    Current Outpatient Medications  Medication Sig Dispense Refill  . ACCU-CHEK FASTCLIX LANCETS MISC     . ACCU-CHEK SMARTVIEW test strip     . amLODipine (NORVASC) 10 MG tablet Take 10 mg by mouth daily.    Marland Kitchen glimepiride (AMARYL) 2 MG tablet TAKE 1/2 TABLET DAILY AS NEEDED IF RANDOM GLUCOSE IS GREATER THAN 200 OR FASTING BLOOD SUGAR IS SUSTAINED GREATER THAN 130    . lisinopril-hydrochlorothiazide (PRINZIDE,ZESTORETIC) 10-12.5 MG per tablet Take 1  tablet by mouth daily.    . metFORMIN (GLUCOPHAGE) 1000 MG tablet Take 1,000 mg by mouth 2 (two) times daily with a meal.     . Multiple Vitamin (MULTIVITAMIN) capsule Take 1 capsule by mouth daily.    Marland Kitchen omeprazole (PRILOSEC) 20 MG capsule Take 20 mg by mouth daily.    . traZODone (DESYREL) 50 MG tablet TAKE 1 TABLET BY MOUTH NIGHTLY    .  vitamin E (VITAMIN E) 400 UNIT capsule Take 400 Units by mouth daily.     No current facility-administered medications for this visit.     OBJECTIVE: There were no vitals filed for this visit.   There is no height or weight on file to calculate BMI.    ECOG FS:0 - Asymptomatic  General: Well-developed, well-nourished, no acute distress. Eyes: Pink conjunctiva, anicteric sclera. HEENT: Normocephalic, moist mucous membranes. Lungs: Clear to auscultation bilaterally. Heart: Regular rate and rhythm. No rubs, murmurs, or gallops. Abdomen: Soft, nontender, nondistended. No organomegaly noted, normoactive bowel sounds. Musculoskeletal: No edema, cyanosis, or clubbing. Neuro: Alert, answering all questions appropriately. Cranial nerves grossly intact. Skin: No rashes or petechiae noted. Psych: Normal affect.  LAB RESULTS:  Lab Results  Component Value Date   NA 135 02/17/2015   K 4.5 02/17/2015   CL 102 02/17/2015   CO2 25 02/17/2015   GLUCOSE 178 (H) 02/17/2015   BUN 16 02/17/2015   CREATININE 0.91 02/17/2015   CALCIUM 9.4 02/17/2015   PROT 7.8 02/16/2015   ALBUMIN 4.0 02/16/2015   AST 27 02/16/2015   ALT 18 02/16/2015   ALKPHOS 56 02/16/2015   BILITOT 0.3 02/16/2015   GFRNONAA 56 (L) 02/17/2015   GFRAA >60 02/17/2015    Lab Results  Component Value Date   WBC 6.1 05/13/2018   NEUTROABS 4.0 05/13/2018   HGB 10.4 (L) 05/13/2018   HCT 31.2 (L) 05/13/2018   MCV 92.3 05/13/2018   PLT 224 05/13/2018   Lab Results  Component Value Date   IRON 40 05/13/2018   TIBC 367 05/13/2018   IRONPCTSAT 11 05/13/2018   Lab Results  Component Value Date   FERRITIN 6 (L) 05/13/2018     STUDIES: No results found.  ASSESSMENT: Iron deficiency anemia.  PLAN:    1. Iron deficiency anemia: Patient's hemoglobin and iron stores have trended down slightly, but she remains asymptomatic.  We discussed the possibility of receiving IV Feraheme today, but patient declined.  She expressed  understanding that she will likely need treatment at her next clinic visit.   Previously, the remainder of her blood work was also either negative or within normal limits.  She last received IV Feraheme on May 22, 2017.  Return to clinic in 6 months with repeat laboratory work and further evaluation.   2. Hypertension: Patient's blood pressure remains mildly elevated.  Continue monitoring and treatment per primary care.   Patient expressed understanding and was in agreement with this plan. She also understands that She can call clinic at any time with any questions, concerns, or complaints.    Lloyd Huger, MD   04/15/2019 4:31 PM

## 2019-04-19 DIAGNOSIS — Z4689 Encounter for fitting and adjustment of other specified devices: Secondary | ICD-10-CM | POA: Diagnosis not present

## 2019-04-19 DIAGNOSIS — N814 Uterovaginal prolapse, unspecified: Secondary | ICD-10-CM | POA: Diagnosis not present

## 2019-04-21 ENCOUNTER — Telehealth: Payer: Self-pay | Admitting: *Deleted

## 2019-04-21 NOTE — Telephone Encounter (Signed)
Pt called to cancel her 04/22/19 lab/MD/Feraheme appt. she stated that it was not scheduled to  suit her work sched and needed to be scheduled after 2pm, I made her aware that It could not be sched that late due to infusion 4pm closing time.Marland Kitchen Appts were cancelled per pt request.

## 2019-04-22 ENCOUNTER — Inpatient Hospital Stay: Payer: Medicare HMO

## 2019-04-22 ENCOUNTER — Inpatient Hospital Stay: Payer: Medicare HMO | Admitting: Oncology

## 2019-04-26 DIAGNOSIS — N183 Chronic kidney disease, stage 3 (moderate): Secondary | ICD-10-CM | POA: Diagnosis not present

## 2019-04-26 DIAGNOSIS — E1169 Type 2 diabetes mellitus with other specified complication: Secondary | ICD-10-CM | POA: Diagnosis not present

## 2019-04-26 DIAGNOSIS — E782 Mixed hyperlipidemia: Secondary | ICD-10-CM | POA: Diagnosis not present

## 2019-04-26 DIAGNOSIS — M816 Localized osteoporosis [Lequesne]: Secondary | ICD-10-CM | POA: Insufficient documentation

## 2019-04-26 DIAGNOSIS — E785 Hyperlipidemia, unspecified: Secondary | ICD-10-CM | POA: Diagnosis not present

## 2019-04-26 DIAGNOSIS — M810 Age-related osteoporosis without current pathological fracture: Secondary | ICD-10-CM | POA: Diagnosis not present

## 2019-04-26 DIAGNOSIS — I10 Essential (primary) hypertension: Secondary | ICD-10-CM | POA: Diagnosis not present

## 2019-04-29 NOTE — Progress Notes (Signed)
[place]  [phone] Fax:(336) [phone]  ID: Baker Yauch OB: [birth year]  MR#: [id number]  [id number]  Patient Care Team: Dion Body, MD as PCP - General (Family Medicine)  CHIEF COMPLAINT:  Iron deficiency anemia.  INTERVAL HISTORY: Patient returns to clinic today for repeat laboratory work, further evaluation, and consideration of IV Feraheme.  She currently feels "fantastic".  She denies any weakness or fatigue and continues to remain active working full-time.  She has no neurologic complaints. She denies any recent fevers or illnesses. She has a good appetite and denies weight loss.  She denies any chest pain, shortness of breath, cough, or hemoptysis.  She denies any nausea, vomiting, consultation, or diarrhea. She has no melena or hematochezia.  She has no urinary complaints.  Patient offers no specific complaints today.  REVIEW OF SYSTEMS:   Review of Systems  Constitutional: Negative.  Negative for fever, malaise/fatigue and weight loss.  Respiratory: Negative.  Negative for cough and shortness of breath.   Cardiovascular: Negative.  Negative for chest pain and leg swelling.  Gastrointestinal: Negative.  Negative for abdominal pain, blood in stool and melena.  Genitourinary: Negative.  Negative for hematuria.  Musculoskeletal: Negative.  Negative for back pain.  Skin: Negative.  Negative for rash.  Neurological: Negative.  Negative for sensory change, focal weakness and weakness.  Psychiatric/Behavioral: Negative.  The patient is not nervous/anxious.     As per HPI. Otherwise, a complete review of systems is negative.  PAST MEDICAL HISTORY: Past Medical History:  Diagnosis Date  . Diabetes mellitus without complication ([place])   . GERD (gastroesophageal reflux disease)   . Hypertension   . Insomnia   . Iron deficiency anemia   . RLS (restless legs syndrome)     PAST SURGICAL HISTORY: Past Surgical History:   Procedure Laterality Date  . BLADDER REPAIR    . CATARACT EXTRACTION W/PHACO Right 05/13/2016   Procedure: CATARACT EXTRACTION PHACO AND INTRAOCULAR LENS PLACEMENT (IOC);  Surgeon: Birder Robson, MD;  Location: [facility] ORS;  Service: Ophthalmology;  Laterality: Right;  [place] 1.09 AP% 21.0 CDE 14.60 Fluid Pack Lot # [id number]  . CATARACT EXTRACTION W/PHACO Left 07/15/2016   Procedure: CATARACT EXTRACTION PHACO AND INTRAOCULAR LENS PLACEMENT (IOC);  Surgeon: Birder Robson, MD;  Location: [facility] ORS;  Service: Ophthalmology;  Laterality: Left;  Lot# [id number] H [place]: 01:17.9 AP%:27.8 CDE: 21.67     FAMILY HISTORY: Reviewed and unchanged. No reported history of malignancy or chronic disease.     ADVANCED DIRECTIVES:    HEALTH MAINTENANCE: Social History   Tobacco Use  . Smoking status: Former Research scientist (life sciences)  . Smokeless tobacco: Never Used  Substance Use Topics  . Alcohol use: No  . Drug use: No     Colonoscopy:  PAP:  Bone density:  Lipid panel:  Allergies  Allergen Reactions  . Codeine   . Demerol [Meperidine]   . Polysaccharide Iron Complex Other (See Comments)    Caused constipation  . Naproxen Rash    Caused rash on mouth    Current Outpatient Medications  Medication Sig Dispense Refill  . ACCU-CHEK FASTCLIX LANCETS MISC     . ACCU-CHEK SMARTVIEW test strip     . amLODipine (NORVASC) 10 MG tablet Take 10 mg by mouth daily.    Marland Kitchen atorvastatin (LIPITOR) 20 MG tablet Take 1 tablet by mouth daily.    Marland Kitchen glimepiride (AMARYL) 2 MG tablet TAKE 1/2 TABLET DAILY AS NEEDED IF RANDOM GLUCOSE IS GREATER THAN 200 OR FASTING BLOOD SUGAR  IS SUSTAINED GREATER THAN 130    . lisinopril-hydrochlorothiazide (PRINZIDE,ZESTORETIC) 10-12.5 MG per tablet Take 1 tablet by mouth daily.    . metFORMIN (GLUCOPHAGE) 1000 MG tablet Take 1,000 mg by mouth 2 (two) times daily with a meal.     . Multiple Vitamin (MULTIVITAMIN) capsule Take 1 capsule by mouth daily.    Marland Kitchen omeprazole (PRILOSEC) 20 MG capsule Take  20 mg by mouth daily.    . pramipexole (MIRAPEX) 0.25 MG tablet Take 1 tablet by mouth 1 day or 1 dose.    . traZODone (DESYREL) 50 MG tablet TAKE 1 TABLET BY MOUTH NIGHTLY    . vitamin E (VITAMIN E) 400 UNIT capsule Take 400 Units by mouth daily.     No current facility-administered medications for this visit.     OBJECTIVE: Vitals:   05/06/19 1043  BP: (!) 162/58  Pulse: 96  Temp: (!) 97.1 F (36.2 C)     Body mass index is 26.83 kg/m.    ECOG FS:0 - Asymptomatic  General: Well-developed, well-nourished, no acute distress. Eyes: Pink conjunctiva, anicteric sclera. HEENT: Normocephalic, moist mucous membranes. Lungs: Clear to auscultation bilaterally. Heart: Regular rate and rhythm. No rubs, murmurs, or gallops. Abdomen: Soft, nontender, nondistended. No organomegaly noted, normoactive bowel sounds. Musculoskeletal: No edema, cyanosis, or clubbing. Neuro: Alert, answering all questions appropriately. Cranial nerves grossly intact. Skin: No rashes or petechiae noted. Psych: Normal affect.  LAB RESULTS:  Lab Results  Component Value Date   NA 135 02/17/2015   K 4.5 02/17/2015   CL 102 02/17/2015   CO2 25 02/17/2015   GLUCOSE 178 (H) 02/17/2015   BUN 16 02/17/2015   CREATININE 0.91 02/17/2015   CALCIUM 9.4 02/17/2015   PROT 7.8 02/16/2015   ALBUMIN 4.0 02/16/2015   AST 27 02/16/2015   ALT 18 02/16/2015   ALKPHOS 56 02/16/2015   BILITOT 0.3 02/16/2015   GFRNONAA 56 (L) 02/17/2015   GFRAA >60 02/17/2015    Lab Results  Component Value Date   WBC 5.1 05/06/2019   NEUTROABS 3.3 05/06/2019   HGB 8.1 (L) 05/06/2019   HCT 26.0 (L) 05/06/2019   MCV 95.2 05/06/2019   PLT 224 05/06/2019   Lab Results  Component Value Date   IRON 37 05/06/2019   TIBC 413 05/06/2019   IRONPCTSAT 9 (L) 05/06/2019   Lab Results  Component Value Date   FERRITIN 5 (L) 05/06/2019     STUDIES: No results found.  ASSESSMENT: Iron deficiency anemia.  PLAN:    1. Iron  deficiency anemia: Patient's hemoglobin has trended down and her iron stores remain significantly reduced.  Previously, the remainder of her blood work was either negative or within normal limits.  Proceed with 510 mg IV Feraheme today.  Return to clinic in 1 week for second infusion.  Patient will then return to clinic in 4 months with repeat laboratory work and further evaluation.  I spent a total of 30 minutes face-to-face with the patient of which greater than 50% of the visit was spent in counseling and coordination of care as detailed above.   Patient expressed understanding and was in agreement with this plan. She also understands that She can call clinic at any time with any questions, concerns, or complaints.    Lloyd Huger, MD   05/07/2019 8:14 AM

## 2019-05-03 DIAGNOSIS — Z87891 Personal history of nicotine dependence: Secondary | ICD-10-CM | POA: Diagnosis not present

## 2019-05-03 DIAGNOSIS — I10 Essential (primary) hypertension: Secondary | ICD-10-CM | POA: Diagnosis not present

## 2019-05-03 DIAGNOSIS — E785 Hyperlipidemia, unspecified: Secondary | ICD-10-CM | POA: Diagnosis not present

## 2019-05-03 DIAGNOSIS — E119 Type 2 diabetes mellitus without complications: Secondary | ICD-10-CM | POA: Diagnosis not present

## 2019-05-03 DIAGNOSIS — R011 Cardiac murmur, unspecified: Secondary | ICD-10-CM | POA: Insufficient documentation

## 2019-05-03 DIAGNOSIS — Z0000 Encounter for general adult medical examination without abnormal findings: Secondary | ICD-10-CM | POA: Diagnosis not present

## 2019-05-03 DIAGNOSIS — Z7984 Long term (current) use of oral hypoglycemic drugs: Secondary | ICD-10-CM | POA: Diagnosis not present

## 2019-05-05 ENCOUNTER — Other Ambulatory Visit: Payer: Self-pay

## 2019-05-05 ENCOUNTER — Encounter: Payer: Self-pay | Admitting: Oncology

## 2019-05-05 NOTE — Progress Notes (Signed)
Patient stated that she had been doing well with no complaints. 

## 2019-05-06 ENCOUNTER — Inpatient Hospital Stay: Payer: Medicare HMO

## 2019-05-06 ENCOUNTER — Other Ambulatory Visit: Payer: Self-pay

## 2019-05-06 ENCOUNTER — Inpatient Hospital Stay: Payer: Medicare HMO | Attending: Oncology

## 2019-05-06 ENCOUNTER — Inpatient Hospital Stay: Payer: Medicare HMO | Admitting: Oncology

## 2019-05-06 VITALS — BP 162/58 | HR 96 | Temp 97.1°F | Ht 61.0 in | Wt 142.0 lb

## 2019-05-06 VITALS — BP 150/52 | HR 76 | Resp 20

## 2019-05-06 DIAGNOSIS — Z87891 Personal history of nicotine dependence: Secondary | ICD-10-CM | POA: Diagnosis not present

## 2019-05-06 DIAGNOSIS — D508 Other iron deficiency anemias: Secondary | ICD-10-CM

## 2019-05-06 DIAGNOSIS — Z7984 Long term (current) use of oral hypoglycemic drugs: Secondary | ICD-10-CM | POA: Insufficient documentation

## 2019-05-06 DIAGNOSIS — Z79899 Other long term (current) drug therapy: Secondary | ICD-10-CM | POA: Insufficient documentation

## 2019-05-06 DIAGNOSIS — E785 Hyperlipidemia, unspecified: Secondary | ICD-10-CM | POA: Insufficient documentation

## 2019-05-06 DIAGNOSIS — I10 Essential (primary) hypertension: Secondary | ICD-10-CM | POA: Diagnosis not present

## 2019-05-06 DIAGNOSIS — K219 Gastro-esophageal reflux disease without esophagitis: Secondary | ICD-10-CM | POA: Diagnosis not present

## 2019-05-06 DIAGNOSIS — D509 Iron deficiency anemia, unspecified: Secondary | ICD-10-CM

## 2019-05-06 DIAGNOSIS — E119 Type 2 diabetes mellitus without complications: Secondary | ICD-10-CM | POA: Diagnosis not present

## 2019-05-06 DIAGNOSIS — G4700 Insomnia, unspecified: Secondary | ICD-10-CM | POA: Diagnosis not present

## 2019-05-06 LAB — CBC WITH DIFFERENTIAL/PLATELET
Abs Immature Granulocytes: 0.02 10*3/uL (ref 0.00–0.07)
Basophils Absolute: 0.1 10*3/uL (ref 0.0–0.1)
Basophils Relative: 1 %
Eosinophils Absolute: 0.3 10*3/uL (ref 0.0–0.5)
Eosinophils Relative: 5 %
HCT: 26 % — ABNORMAL LOW (ref 36.0–46.0)
Hemoglobin: 8.1 g/dL — ABNORMAL LOW (ref 12.0–15.0)
Immature Granulocytes: 0 %
Lymphocytes Relative: 21 %
Lymphs Abs: 1.1 10*3/uL (ref 0.7–4.0)
MCH: 29.7 pg (ref 26.0–34.0)
MCHC: 31.2 g/dL (ref 30.0–36.0)
MCV: 95.2 fL (ref 80.0–100.0)
Monocytes Absolute: 0.4 10*3/uL (ref 0.1–1.0)
Monocytes Relative: 7 %
Neutro Abs: 3.3 10*3/uL (ref 1.7–7.7)
Neutrophils Relative %: 66 %
Platelets: 224 10*3/uL (ref 150–400)
RBC: 2.73 MIL/uL — ABNORMAL LOW (ref 3.87–5.11)
RDW: 12.8 % (ref 11.5–15.5)
WBC: 5.1 10*3/uL (ref 4.0–10.5)
nRBC: 0 % (ref 0.0–0.2)

## 2019-05-06 LAB — IRON AND TIBC
Iron: 37 ug/dL (ref 28–170)
Saturation Ratios: 9 % — ABNORMAL LOW (ref 10.4–31.8)
TIBC: 413 ug/dL (ref 250–450)
UIBC: 376 ug/dL

## 2019-05-06 LAB — FERRITIN: Ferritin: 5 ng/mL — ABNORMAL LOW (ref 11–307)

## 2019-05-06 MED ORDER — SODIUM CHLORIDE 0.9 % IV SOLN
510.0000 mg | Freq: Once | INTRAVENOUS | Status: AC
  Administered 2019-05-06: 12:00:00 510 mg via INTRAVENOUS
  Filled 2019-05-06: qty 17

## 2019-05-06 MED ORDER — SODIUM CHLORIDE 0.9 % IV SOLN
INTRAVENOUS | Status: DC
  Administered 2019-05-06: 12:00:00 via INTRAVENOUS
  Filled 2019-05-06: qty 250

## 2019-05-12 ENCOUNTER — Inpatient Hospital Stay: Payer: Medicare HMO | Attending: Oncology

## 2019-05-12 ENCOUNTER — Other Ambulatory Visit: Payer: Self-pay

## 2019-05-12 VITALS — BP 116/65 | HR 71 | Temp 99.5°F | Resp 18

## 2019-05-12 DIAGNOSIS — D509 Iron deficiency anemia, unspecified: Secondary | ICD-10-CM | POA: Diagnosis not present

## 2019-05-12 DIAGNOSIS — D508 Other iron deficiency anemias: Secondary | ICD-10-CM

## 2019-05-12 MED ORDER — SODIUM CHLORIDE 0.9 % IV SOLN
510.0000 mg | Freq: Once | INTRAVENOUS | Status: AC
  Administered 2019-05-12: 510 mg via INTRAVENOUS
  Filled 2019-05-12: qty 17

## 2019-05-12 MED ORDER — SODIUM CHLORIDE 0.9 % IV SOLN
Freq: Once | INTRAVENOUS | Status: AC
  Administered 2019-05-12: 14:00:00 via INTRAVENOUS
  Filled 2019-05-12: qty 250

## 2019-06-30 DIAGNOSIS — E119 Type 2 diabetes mellitus without complications: Secondary | ICD-10-CM | POA: Diagnosis not present

## 2019-07-04 ENCOUNTER — Other Ambulatory Visit: Payer: Self-pay

## 2019-07-04 DIAGNOSIS — Z20822 Contact with and (suspected) exposure to covid-19: Secondary | ICD-10-CM

## 2019-07-05 ENCOUNTER — Telehealth: Payer: Self-pay | Admitting: *Deleted

## 2019-07-05 NOTE — Telephone Encounter (Signed)
Patient called for results ,still pending advised to call back. 

## 2019-07-06 ENCOUNTER — Telehealth: Payer: Self-pay

## 2019-07-06 LAB — NOVEL CORONAVIRUS, NAA: SARS-CoV-2, NAA: DETECTED — AB

## 2019-07-06 NOTE — Telephone Encounter (Signed)
Pt notified of positive COVID-19 test results. Pt verbalized understanding. Patient reports that she doesn't have any sympotms .Pt advised to remain in self quarantine until at least 10 days since symptom onset And 3 consecutive days fever free without antipyretics And improvement in respiratory symptoms. Patient advised to utilize over the counter medications to treat symptoms. Pt advised to seek treatment in the ED if respiratory issues/distress develops.Pt advised they should only leave home to seek and medical care and must wear a mask in public. Pt instructed to limit contact with family members or caregivers in the home. Pt advised to practice social distancing and to continue to use good preventative care measures such has frequent hand washing, staying out of crowds and cleaning hard surfaces frequently touched in the home.Pt informed that the health department will likely follow up and may have additional recommendations. Will notify [facility] Department.

## 2019-07-19 DIAGNOSIS — Z4689 Encounter for fitting and adjustment of other specified devices: Secondary | ICD-10-CM | POA: Diagnosis not present

## 2019-07-19 DIAGNOSIS — K5909 Other constipation: Secondary | ICD-10-CM | POA: Diagnosis not present

## 2019-09-03 NOTE — Progress Notes (Deleted)
[place]  Telephone:(336) [phone] Fax:(336) [phone]  ID: Lisa Mcneil OB: September 11, 1927  MR#: [id number]  [id number]  Patient Care Team: Dion Body, MD as PCP - General (Family Medicine)  CHIEF COMPLAINT:  Iron deficiency anemia.  INTERVAL HISTORY: Patient returns to clinic today for repeat laboratory work, further evaluation, and consideration of IV Feraheme.  She currently feels "fantastic".  She denies any weakness or fatigue and continues to remain active working full-time.  She has no neurologic complaints. She denies any recent fevers or illnesses. She has a good appetite and denies weight loss.  She denies any chest pain, shortness of breath, cough, or hemoptysis.  She denies any nausea, vomiting, consultation, or diarrhea. She has no melena or hematochezia.  She has no urinary complaints.  Patient offers no specific complaints today.  REVIEW OF SYSTEMS:   Review of Systems  Constitutional: Negative.  Negative for fever, malaise/fatigue and weight loss.  Respiratory: Negative.  Negative for cough and shortness of breath.   Cardiovascular: Negative.  Negative for chest pain and leg swelling.  Gastrointestinal: Negative.  Negative for abdominal pain, blood in stool and melena.  Genitourinary: Negative.  Negative for hematuria.  Musculoskeletal: Negative.  Negative for back pain.  Skin: Negative.  Negative for rash.  Neurological: Negative.  Negative for sensory change, focal weakness and weakness.  Psychiatric/Behavioral: Negative.  The patient is not nervous/anxious.     As per HPI. Otherwise, a complete review of systems is negative.  PAST MEDICAL HISTORY: Past Medical History:  Diagnosis Date  . Diabetes mellitus without complication ([place])   . GERD (gastroesophageal reflux disease)   . Hypertension   . Insomnia   . Iron deficiency anemia   . RLS (restless legs syndrome)     PAST SURGICAL HISTORY: Past Surgical History:    Procedure Laterality Date  . BLADDER REPAIR    . CATARACT EXTRACTION W/PHACO Right 05/13/2016   Procedure: CATARACT EXTRACTION PHACO AND INTRAOCULAR LENS PLACEMENT (IOC);  Surgeon: Birder Robson, MD;  Location: [facility] ORS;  Service: Ophthalmology;  Laterality: Right;  [place] 1.09 AP% 21.0 CDE 14.60 Fluid Pack Lot # [id number]  . CATARACT EXTRACTION W/PHACO Left 07/15/2016   Procedure: CATARACT EXTRACTION PHACO AND INTRAOCULAR LENS PLACEMENT (IOC);  Surgeon: Birder Robson, MD;  Location: [facility] ORS;  Service: Ophthalmology;  Laterality: Left;  Lot# [id number] H [place]: 01:17.9 AP%:27.8 CDE: 21.67     FAMILY HISTORY: Reviewed and unchanged. No reported history of malignancy or chronic disease.     ADVANCED DIRECTIVES:    HEALTH MAINTENANCE: Social History   Tobacco Use  . Smoking status: Former Research scientist (life sciences)  . Smokeless tobacco: Never Used  Substance Use Topics  . Alcohol use: No  . Drug use: No     Colonoscopy:  PAP:  Bone density:  Lipid panel:  Allergies  Allergen Reactions  . Codeine   . Demerol [Meperidine]   . Polysaccharide Iron Complex Other (See Comments)    Caused constipation  . Naproxen Rash    Caused rash on mouth    Current Outpatient Medications  Medication Sig Dispense Refill  . ACCU-CHEK FASTCLIX LANCETS MISC     . ACCU-CHEK SMARTVIEW test strip     . amLODipine (NORVASC) 10 MG tablet Take 10 mg by mouth daily.    Marland Kitchen atorvastatin (LIPITOR) 20 MG tablet Take 1 tablet by mouth daily.    Marland Kitchen glimepiride (AMARYL) 2 MG tablet TAKE 1/2 TABLET DAILY AS NEEDED IF RANDOM GLUCOSE IS GREATER THAN 200 OR FASTING BLOOD  SUGAR IS SUSTAINED GREATER THAN 130    . lisinopril-hydrochlorothiazide (PRINZIDE,ZESTORETIC) 10-12.5 MG per tablet Take 1 tablet by mouth daily.    . metFORMIN (GLUCOPHAGE) 1000 MG tablet Take 1,000 mg by mouth 2 (two) times daily with a meal.     . Multiple Vitamin (MULTIVITAMIN) capsule Take 1 capsule by mouth daily.    Marland Kitchen omeprazole (PRILOSEC) 20 MG capsule  Take 20 mg by mouth daily.    . pramipexole (MIRAPEX) 0.25 MG tablet Take 1 tablet by mouth 1 day or 1 dose.    . traZODone (DESYREL) 50 MG tablet TAKE 1 TABLET BY MOUTH NIGHTLY    . vitamin E (VITAMIN E) 400 UNIT capsule Take 400 Units by mouth daily.     No current facility-administered medications for this visit.    OBJECTIVE: There were no vitals filed for this visit.   There is no height or weight on file to calculate BMI.    ECOG FS:0 - Asymptomatic  General: Well-developed, well-nourished, no acute distress. Eyes: Pink conjunctiva, anicteric sclera. HEENT: Normocephalic, moist mucous membranes. Lungs: Clear to auscultation bilaterally. Heart: Regular rate and rhythm. No rubs, murmurs, or gallops. Abdomen: Soft, nontender, nondistended. No organomegaly noted, normoactive bowel sounds. Musculoskeletal: No edema, cyanosis, or clubbing. Neuro: Alert, answering all questions appropriately. Cranial nerves grossly intact. Skin: No rashes or petechiae noted. Psych: Normal affect.  LAB RESULTS:  Lab Results  Component Value Date   NA 135 02/17/2015   K 4.5 02/17/2015   CL 102 02/17/2015   CO2 25 02/17/2015   GLUCOSE 178 (H) 02/17/2015   BUN 16 02/17/2015   CREATININE 0.91 02/17/2015   CALCIUM 9.4 02/17/2015   PROT 7.8 02/16/2015   ALBUMIN 4.0 02/16/2015   AST 27 02/16/2015   ALT 18 02/16/2015   ALKPHOS 56 02/16/2015   BILITOT 0.3 02/16/2015   GFRNONAA 56 (L) 02/17/2015   GFRAA >60 02/17/2015    Lab Results  Component Value Date   WBC 5.1 05/06/2019   NEUTROABS 3.3 05/06/2019   HGB 8.1 (L) 05/06/2019   HCT 26.0 (L) 05/06/2019   MCV 95.2 05/06/2019   PLT 224 05/06/2019   Lab Results  Component Value Date   IRON 37 05/06/2019   TIBC 413 05/06/2019   IRONPCTSAT 9 (L) 05/06/2019   Lab Results  Component Value Date   FERRITIN 5 (L) 05/06/2019     STUDIES: No results found.  ASSESSMENT: Iron deficiency anemia.  PLAN:    1. Iron deficiency anemia:  Patient's hemoglobin has trended down and her iron stores remain significantly reduced.  Previously, the remainder of her blood work was either negative or within normal limits.  Proceed with 510 mg IV Feraheme today.  Return to clinic in 1 week for second infusion.  Patient will then return to clinic in 4 months with repeat laboratory work and further evaluation.  I spent a total of 30 minutes face-to-face with the patient of which greater than 50% of the visit was spent in counseling and coordination of care as detailed above.   Patient expressed understanding and was in agreement with this plan. She also understands that She can call clinic at any time with any questions, concerns, or complaints.    Lloyd Huger, MD   09/03/2019 11:20 AM

## 2019-09-06 ENCOUNTER — Other Ambulatory Visit: Payer: Self-pay | Admitting: Emergency Medicine

## 2019-09-06 DIAGNOSIS — D509 Iron deficiency anemia, unspecified: Secondary | ICD-10-CM

## 2019-09-07 ENCOUNTER — Inpatient Hospital Stay: Payer: Medicare HMO

## 2019-09-08 ENCOUNTER — Inpatient Hospital Stay: Payer: Medicare HMO | Admitting: Oncology

## 2019-09-08 ENCOUNTER — Inpatient Hospital Stay: Payer: Medicare HMO

## 2019-10-03 ENCOUNTER — Ambulatory Visit: Payer: Medicare HMO | Attending: Internal Medicine

## 2019-10-03 DIAGNOSIS — Z23 Encounter for immunization: Secondary | ICD-10-CM

## 2019-10-03 NOTE — Progress Notes (Signed)
   Covid-19 Vaccination Clinic  Name:  Lisa Mcneil    MRN: [id number] DOB: [birth year]  10/03/2019  [name] was observed post Covid-19 immunization for 15 minutes without incidence. She was provided with Vaccine Information Sheet and instruction to access the V-Safe system.   [name] was instructed to call 911 with any severe reactions post vaccine: Marland Kitchen Difficulty breathing  . Swelling of your face and throat  . A fast heartbeat  . A bad rash all over your body  . Dizziness and weakness    Immunizations Administered    Name Date Dose VIS Date Route   Moderna COVID-19 Vaccine 10/03/2019 11:21 AM 0.5 mL 07/12/2019 Intramuscular   Manufacturer: Moderna   Lot: [id number]   [id number]

## 2019-10-08 NOTE — Progress Notes (Signed)
[place]  Telephone:(336) [phone] Fax:(336) [phone]  ID: Lisa Mcneil OB: 08/03/1928  MR#: [id number]  [id number]  Patient Care Team: Dion Body, MD as PCP - General (Family Medicine)  CHIEF COMPLAINT:  Iron deficiency anemia.  INTERVAL HISTORY: Patient returns to clinic today for repeat laboratory work, further evaluation, and consideration of additional IV Feraheme.  She continues to feel well and remains asymptomatic. She denies any weakness or fatigue and continues to remain active working full-time.  She has no neurologic complaints. She denies any recent fevers or illnesses. She has a good appetite and denies weight loss.  She denies any chest pain, shortness of breath, cough, or hemoptysis.  She denies any nausea, vomiting, consultation, or diarrhea. She has no melena or hematochezia.  She has no urinary complaints.  Patient offers no specific complaints today.  REVIEW OF SYSTEMS:   Review of Systems  Constitutional: Negative.  Negative for fever, malaise/fatigue and weight loss.  Respiratory: Negative.  Negative for cough and shortness of breath.   Cardiovascular: Negative.  Negative for chest pain and leg swelling.  Gastrointestinal: Negative.  Negative for abdominal pain, blood in stool and melena.  Genitourinary: Negative.  Negative for hematuria.  Musculoskeletal: Negative.  Negative for back pain.  Skin: Negative.  Negative for rash.  Neurological: Negative.  Negative for sensory change, focal weakness and weakness.  Psychiatric/Behavioral: Negative.  The patient is not nervous/anxious.     As per HPI. Otherwise, a complete review of systems is negative.  PAST MEDICAL HISTORY: Past Medical History:  Diagnosis Date  . Diabetes mellitus without complication ([place])   . GERD (gastroesophageal reflux disease)   . Hypertension   . Insomnia   . Iron deficiency anemia   . RLS (restless legs syndrome)     PAST SURGICAL  HISTORY: Past Surgical History:  Procedure Laterality Date  . BLADDER REPAIR    . CATARACT EXTRACTION W/PHACO Right 05/13/2016   Procedure: CATARACT EXTRACTION PHACO AND INTRAOCULAR LENS PLACEMENT (IOC);  Surgeon: Birder Robson, MD;  Location: [facility] ORS;  Service: Ophthalmology;  Laterality: Right;  [place] 1.09 AP% 21.0 CDE 14.60 Fluid Pack Lot # [id number]  . CATARACT EXTRACTION W/PHACO Left 07/15/2016   Procedure: CATARACT EXTRACTION PHACO AND INTRAOCULAR LENS PLACEMENT (IOC);  Surgeon: Birder Robson, MD;  Location: [facility] ORS;  Service: Ophthalmology;  Laterality: Left;  Lot# [id number] H [place]: 01:17.9 AP%:27.8 CDE: 21.67     FAMILY HISTORY: Reviewed and unchanged. No reported history of malignancy or chronic disease.     ADVANCED DIRECTIVES:    HEALTH MAINTENANCE: Social History   Tobacco Use  . Smoking status: Former Research scientist (life sciences)  . Smokeless tobacco: Never Used  Substance Use Topics  . Alcohol use: No  . Drug use: No     Colonoscopy:  PAP:  Bone density:  Lipid panel:  Allergies  Allergen Reactions  . Codeine   . Demerol [Meperidine]   . Polysaccharide Iron Complex Other (See Comments)    Caused constipation  . Naproxen Rash    Caused rash on mouth    Current Outpatient Medications  Medication Sig Dispense Refill  . ACCU-CHEK FASTCLIX LANCETS MISC     . ACCU-CHEK SMARTVIEW test strip     . alendronate (FOSAMAX) 70 MG tablet Take 1 tablet by mouth once a week.    Marland Kitchen amLODipine (NORVASC) 10 MG tablet Take 10 mg by mouth daily.    Marland Kitchen atorvastatin (LIPITOR) 20 MG tablet Take 1 tablet by mouth daily.    . Calcium  Carbonate-Vitamin D 600-400 MG-UNIT tablet Take 1 tablet by mouth 2 (two) times daily.    Marland Kitchen glimepiride (AMARYL) 2 MG tablet TAKE 1/2 TABLET DAILY AS NEEDED IF RANDOM GLUCOSE IS GREATER THAN 200 OR FASTING BLOOD SUGAR IS SUSTAINED GREATER THAN 130    . lisinopril-hydrochlorothiazide (PRINZIDE,ZESTORETIC) 10-12.5 MG per tablet Take 1 tablet by mouth daily.    .  metFORMIN (GLUCOPHAGE) 1000 MG tablet Take 1,000 mg by mouth 2 (two) times daily with a meal.     . Multiple Vitamin (MULTIVITAMIN) capsule Take 1 capsule by mouth daily.    Marland Kitchen omeprazole (PRILOSEC) 20 MG capsule Take 20 mg by mouth daily.    . polyethylene glycol powder (GLYCOLAX/MIRALAX) 17 GM/SCOOP powder Take 17 g by mouth daily.    . pramipexole (MIRAPEX) 0.25 MG tablet Take 1 tablet by mouth 1 day or 1 dose.    . traZODone (DESYREL) 50 MG tablet TAKE 1 TABLET BY MOUTH NIGHTLY    . vitamin E (VITAMIN E) 400 UNIT capsule Take 400 Units by mouth daily.     No current facility-administered medications for this visit.    OBJECTIVE: Vitals:   10/14/19 1308  BP: (!) 159/58  Pulse: 91  Temp: 97.7 F (36.5 C)  SpO2: 96%     Body mass index is 27 kg/m.    ECOG FS:0 - Asymptomatic  General: Well-developed, well-nourished, no acute distress. Eyes: Pink conjunctiva, anicteric sclera. HEENT: Normocephalic, moist mucous membranes. Lungs: No audible wheezing or coughing. Heart: Regular rate and rhythm. Abdomen: Soft, nontender, no obvious distention. Musculoskeletal: No edema, cyanosis, or clubbing. Neuro: Alert, answering all questions appropriately. Cranial nerves grossly intact. Skin: No rashes or petechiae noted. Psych: Normal affect.  LAB RESULTS:  Lab Results  Component Value Date   NA 135 02/17/2015   K 4.5 02/17/2015   CL 102 02/17/2015   CO2 25 02/17/2015   GLUCOSE 178 (H) 02/17/2015   BUN 16 02/17/2015   CREATININE 0.91 02/17/2015   CALCIUM 9.4 02/17/2015   PROT 7.8 02/16/2015   ALBUMIN 4.0 02/16/2015   AST 27 02/16/2015   ALT 18 02/16/2015   ALKPHOS 56 02/16/2015   BILITOT 0.3 02/16/2015   GFRNONAA 56 (L) 02/17/2015   GFRAA >60 02/17/2015    Lab Results  Component Value Date   WBC 6.0 10/13/2019   NEUTROABS 3.8 10/13/2019   HGB 11.1 (L) 10/13/2019   HCT 34.1 (L) 10/13/2019   MCV 91.9 10/13/2019   PLT 213 10/13/2019   Lab Results  Component Value Date    IRON 56 10/13/2019   TIBC 400 10/13/2019   IRONPCTSAT 14 10/13/2019   Lab Results  Component Value Date   FERRITIN 12 10/13/2019     STUDIES: No results found.  ASSESSMENT: Iron deficiency anemia.  PLAN:    1. Iron deficiency anemia: Patient's hemoglobin remains decreased, but is trending up and is now 11.1.  Iron stores are now within normal limits. Previously, the remainder of her blood work was either negative or within normal limits.  She does not require additional Feraheme today.  Patient last received 510 mg IV Feraheme on May 12, 2019.  Return to clinic in 4 months with repeat laboratory work and further evaluation.    I spent a total of 20 minutes reviewing chart data, face-to-face evaluation with the patient, counseling and coordination of care as detailed above.   Patient expressed understanding and was in agreement with this plan. She also understands that She can call clinic at  any time with any questions, concerns, or complaints.    Lloyd Huger, MD   10/16/2019 7:23 AM

## 2019-10-10 ENCOUNTER — Ambulatory Visit: Payer: Medicare HMO | Attending: Internal Medicine

## 2019-10-10 DIAGNOSIS — Z20822 Contact with and (suspected) exposure to covid-19: Secondary | ICD-10-CM | POA: Diagnosis not present

## 2019-10-11 LAB — NOVEL CORONAVIRUS, NAA: SARS-CoV-2, NAA: NOT DETECTED

## 2019-10-11 LAB — SPECIMEN STATUS REPORT

## 2019-10-13 ENCOUNTER — Other Ambulatory Visit: Payer: Self-pay

## 2019-10-13 ENCOUNTER — Inpatient Hospital Stay: Payer: Medicare HMO | Attending: Oncology

## 2019-10-13 ENCOUNTER — Encounter: Payer: Self-pay | Admitting: Oncology

## 2019-10-13 DIAGNOSIS — D509 Iron deficiency anemia, unspecified: Secondary | ICD-10-CM

## 2019-10-13 DIAGNOSIS — Z87891 Personal history of nicotine dependence: Secondary | ICD-10-CM | POA: Insufficient documentation

## 2019-10-13 LAB — FERRITIN: Ferritin: 12 ng/mL (ref 11–307)

## 2019-10-13 LAB — CBC WITH DIFFERENTIAL/PLATELET
Abs Immature Granulocytes: 0.02 10*3/uL (ref 0.00–0.07)
Basophils Absolute: 0.1 10*3/uL (ref 0.0–0.1)
Basophils Relative: 1 %
Eosinophils Absolute: 0.2 10*3/uL (ref 0.0–0.5)
Eosinophils Relative: 4 %
HCT: 34.1 % — ABNORMAL LOW (ref 36.0–46.0)
Hemoglobin: 11.1 g/dL — ABNORMAL LOW (ref 12.0–15.0)
Immature Granulocytes: 0 %
Lymphocytes Relative: 25 %
Lymphs Abs: 1.5 10*3/uL (ref 0.7–4.0)
MCH: 29.9 pg (ref 26.0–34.0)
MCHC: 32.6 g/dL (ref 30.0–36.0)
MCV: 91.9 fL (ref 80.0–100.0)
Monocytes Absolute: 0.4 10*3/uL (ref 0.1–1.0)
Monocytes Relative: 7 %
Neutro Abs: 3.8 10*3/uL (ref 1.7–7.7)
Neutrophils Relative %: 63 %
Platelets: 213 10*3/uL (ref 150–400)
RBC: 3.71 MIL/uL — ABNORMAL LOW (ref 3.87–5.11)
RDW: 12.8 % (ref 11.5–15.5)
WBC: 6 10*3/uL (ref 4.0–10.5)
nRBC: 0 % (ref 0.0–0.2)

## 2019-10-13 LAB — IRON AND TIBC
Iron: 56 ug/dL (ref 28–170)
Saturation Ratios: 14 % (ref 10.4–31.8)
TIBC: 400 ug/dL (ref 250–450)
UIBC: 344 ug/dL

## 2019-10-13 NOTE — Progress Notes (Signed)
Patient prescreened for appointment. Patient has no concerns or questions.  

## 2019-10-14 ENCOUNTER — Inpatient Hospital Stay: Payer: Medicare HMO | Admitting: Oncology

## 2019-10-14 ENCOUNTER — Other Ambulatory Visit: Payer: Self-pay

## 2019-10-14 ENCOUNTER — Inpatient Hospital Stay: Payer: Medicare HMO

## 2019-10-14 VITALS — BP 159/58 | HR 91 | Temp 97.7°F | Wt 142.9 lb

## 2019-10-14 DIAGNOSIS — D509 Iron deficiency anemia, unspecified: Secondary | ICD-10-CM

## 2019-10-14 DIAGNOSIS — Z87891 Personal history of nicotine dependence: Secondary | ICD-10-CM | POA: Diagnosis not present

## 2019-10-17 DIAGNOSIS — E785 Hyperlipidemia, unspecified: Secondary | ICD-10-CM | POA: Diagnosis not present

## 2019-10-17 DIAGNOSIS — E1169 Type 2 diabetes mellitus with other specified complication: Secondary | ICD-10-CM | POA: Diagnosis not present

## 2019-10-17 DIAGNOSIS — I10 Essential (primary) hypertension: Secondary | ICD-10-CM | POA: Diagnosis not present

## 2019-10-17 DIAGNOSIS — N183 Chronic kidney disease, stage 3 unspecified: Secondary | ICD-10-CM | POA: Diagnosis not present

## 2019-10-17 DIAGNOSIS — E782 Mixed hyperlipidemia: Secondary | ICD-10-CM | POA: Diagnosis not present

## 2019-10-17 DIAGNOSIS — E611 Iron deficiency: Secondary | ICD-10-CM | POA: Diagnosis not present

## 2019-10-18 DIAGNOSIS — E1122 Type 2 diabetes mellitus with diabetic chronic kidney disease: Secondary | ICD-10-CM | POA: Diagnosis not present

## 2019-10-18 DIAGNOSIS — I129 Hypertensive chronic kidney disease with stage 1 through stage 4 chronic kidney disease, or unspecified chronic kidney disease: Secondary | ICD-10-CM | POA: Diagnosis not present

## 2019-10-18 DIAGNOSIS — Z0000 Encounter for general adult medical examination without abnormal findings: Secondary | ICD-10-CM | POA: Diagnosis not present

## 2019-10-18 DIAGNOSIS — E782 Mixed hyperlipidemia: Secondary | ICD-10-CM | POA: Diagnosis not present

## 2019-10-18 DIAGNOSIS — N1832 Chronic kidney disease, stage 3b: Secondary | ICD-10-CM | POA: Diagnosis not present

## 2019-10-18 DIAGNOSIS — Z7984 Long term (current) use of oral hypoglycemic drugs: Secondary | ICD-10-CM | POA: Diagnosis not present

## 2019-10-18 DIAGNOSIS — E1169 Type 2 diabetes mellitus with other specified complication: Secondary | ICD-10-CM | POA: Diagnosis not present

## 2019-10-20 DIAGNOSIS — Z4689 Encounter for fitting and adjustment of other specified devices: Secondary | ICD-10-CM | POA: Diagnosis not present

## 2019-11-01 ENCOUNTER — Ambulatory Visit: Payer: Medicare HMO | Attending: Internal Medicine

## 2019-11-01 DIAGNOSIS — Z23 Encounter for immunization: Secondary | ICD-10-CM

## 2019-11-01 NOTE — Progress Notes (Signed)
   Covid-19 Vaccination Clinic  Name:  Lisa Mcneil    MRN: [id number] DOB: [birth year]  11/01/2019  [name] was observed post Covid-19 immunization for 15 minutes without incident. She was provided with Vaccine Information Sheet and instruction to access the V-Safe system.   [name] was instructed to call 911 with any severe reactions post vaccine: Marland Kitchen Difficulty breathing  . Swelling of face and throat  . A fast heartbeat  . A bad rash all over body  . Dizziness and weakness   Immunizations Administered    Name Date Dose VIS Date Route   Moderna COVID-19 Vaccine 11/01/2019 11:10 AM 0.5 mL 07/12/2019 Intramuscular   Manufacturer: Levan Hurst   Lot: [id number]   [place]: 77373-668-15

## 2020-02-23 ENCOUNTER — Other Ambulatory Visit: Payer: Medicare HMO

## 2020-02-24 ENCOUNTER — Ambulatory Visit: Payer: Medicare HMO

## 2020-02-24 ENCOUNTER — Ambulatory Visit: Payer: Medicare HMO | Admitting: Oncology

## 2020-04-14 NOTE — Progress Notes (Signed)
[place]  Telephone:(336) [phone] Fax:(336) [phone]  ID: Bobbette Eakes OB: Mar 08, 1928  MR#: [id number]  IHK#:[id number]  Patient Care Team: Dion Body, MD as PCP - General (Family Medicine) Lloyd Huger, MD as Consulting Physician (Hematology and Oncology)  CHIEF COMPLAINT:  Iron deficiency anemia.  INTERVAL HISTORY: Patient returns to clinic today for repeat laboratory work, further evaluation, and continuation of IV Feraheme.  She continues to feel well and remains asymptomatic.  She does not complain of weakness or fatigue today.  She continues to be active and work full-time. She has no neurologic complaints. She denies any recent fevers or illnesses. She has a good appetite and denies weight loss.  She denies any chest pain, shortness of breath, cough, or hemoptysis.  She denies any nausea, vomiting, consultation, or diarrhea. She has no melena or hematochezia.  She has no urinary complaints.  Patient feels at her baseline offers no specific complaints today.  REVIEW OF SYSTEMS:   Review of Systems  Constitutional: Negative.  Negative for fever, malaise/fatigue and weight loss.  Respiratory: Negative.  Negative for cough and shortness of breath.   Cardiovascular: Negative.  Negative for chest pain and leg swelling.  Gastrointestinal: Negative.  Negative for abdominal pain, blood in stool and melena.  Genitourinary: Negative.  Negative for hematuria.  Musculoskeletal: Negative.  Negative for back pain.  Skin: Negative.  Negative for rash.  Neurological: Negative.  Negative for sensory change, focal weakness and weakness.  Psychiatric/Behavioral: Negative.  The patient is not nervous/anxious.     As per HPI. Otherwise, a complete review of systems is negative.  PAST MEDICAL HISTORY: Past Medical History:  Diagnosis Date  . Diabetes mellitus without complication ([place])   . GERD (gastroesophageal reflux disease)   . Hypertension   .  Insomnia   . Iron deficiency anemia   . RLS (restless legs syndrome)     PAST SURGICAL HISTORY: Past Surgical History:  Procedure Laterality Date  . BLADDER REPAIR    . CATARACT EXTRACTION W/PHACO Right 05/13/2016   Procedure: CATARACT EXTRACTION PHACO AND INTRAOCULAR LENS PLACEMENT (IOC);  Surgeon: Birder Robson, MD;  Location: [facility] ORS;  Service: Ophthalmology;  Laterality: Right;  [place] 1.09 AP% 21.0 CDE 14.60 Fluid Pack Lot # [id number]  . CATARACT EXTRACTION W/PHACO Left 07/15/2016   Procedure: CATARACT EXTRACTION PHACO AND INTRAOCULAR LENS PLACEMENT (IOC);  Surgeon: Birder Robson, MD;  Location: [facility] ORS;  Service: Ophthalmology;  Laterality: Left;  Lot# [id number] H [place]: 01:17.9 AP%:27.8 CDE: 21.67     FAMILY HISTORY: Reviewed and unchanged. No reported history of malignancy or chronic disease.     ADVANCED DIRECTIVES:    HEALTH MAINTENANCE: Social History   Tobacco Use  . Smoking status: Former Research scientist (life sciences)  . Smokeless tobacco: Never Used  Vaping Use  . Vaping Use: Never used  Substance Use Topics  . Alcohol use: No  . Drug use: No     Colonoscopy:  PAP:  Bone density:  Lipid panel:  Allergies  Allergen Reactions  . Codeine   . Demerol [Meperidine]   . Polysaccharide Iron Complex Other (See Comments)    Caused constipation  . Naproxen Rash    Caused rash on mouth    Current Outpatient Medications  Medication Sig Dispense Refill  . ACCU-CHEK FASTCLIX LANCETS MISC     . ACCU-CHEK SMARTVIEW test strip     . amLODipine (NORVASC) 10 MG tablet Take 10 mg by mouth daily.    Marland Kitchen atorvastatin (LIPITOR) 20 MG tablet Take  1 tablet by mouth daily.    Marland Kitchen lisinopril-hydrochlorothiazide (PRINZIDE,ZESTORETIC) 10-12.5 MG per tablet Take 1 tablet by mouth daily.    . metFORMIN (GLUCOPHAGE) 1000 MG tablet Take 1,000 mg by mouth 2 (two) times daily with a meal.     . Multiple Vitamin (MULTIVITAMIN) capsule Take 1 capsule by mouth daily.    Marland Kitchen omeprazole (PRILOSEC) 20 MG capsule  Take 20 mg by mouth daily.    . polyethylene glycol powder (GLYCOLAX/MIRALAX) 17 GM/SCOOP powder Take 17 g by mouth daily.    . pramipexole (MIRAPEX) 0.25 MG tablet Take 1 tablet by mouth 1 day or 1 dose.    . vitamin E (VITAMIN E) 400 UNIT capsule Take 400 Units by mouth daily.    Marland Kitchen alendronate (FOSAMAX) 70 MG tablet Take 1 tablet by mouth once a week. (Patient not taking: Reported on 04/19/2020)    . Calcium Carbonate-Vitamin D 600-400 MG-UNIT tablet Take 1 tablet by mouth 2 (two) times daily.    Marland Kitchen glimepiride (AMARYL) 2 MG tablet TAKE 1/2 TABLET DAILY AS NEEDED IF RANDOM GLUCOSE IS GREATER THAN 200 OR FASTING BLOOD SUGAR IS SUSTAINED GREATER THAN 130    . traZODone (DESYREL) 50 MG tablet TAKE 1 TABLET BY MOUTH NIGHTLY (Patient not taking: Reported on 04/19/2020)     No current facility-administered medications for this visit.    OBJECTIVE: Vitals:   04/19/20 1341  BP: (!) 138/42  Pulse: 76  Temp: 98.9 F (37.2 C)  SpO2: 97%     Body mass index is 26.77 kg/m.    ECOG FS:0 - Asymptomatic  General: Thin, no acute distress. Eyes: Pink conjunctiva, anicteric sclera. HEENT: Normocephalic, moist mucous membranes. Lungs: No audible wheezing or coughing. Heart: Regular rate and rhythm. Abdomen: Soft, nontender, no obvious distention. Musculoskeletal: No edema, cyanosis, or clubbing. Neuro: Alert, answering all questions appropriately. Cranial nerves grossly intact. Skin: No rashes or petechiae noted. Psych: Normal affect.   LAB RESULTS:  Lab Results  Component Value Date   NA 135 02/17/2015   K 4.5 02/17/2015   CL 102 02/17/2015   CO2 25 02/17/2015   GLUCOSE 178 (H) 02/17/2015   BUN 16 02/17/2015   CREATININE 0.91 02/17/2015   CALCIUM 9.4 02/17/2015   PROT 7.8 02/16/2015   ALBUMIN 4.0 02/16/2015   AST 27 02/16/2015   ALT 18 02/16/2015   ALKPHOS 56 02/16/2015   BILITOT 0.3 02/16/2015   GFRNONAA 56 (L) 02/17/2015   GFRAA >60 02/17/2015    Lab Results  Component Value  Date   WBC 5.8 04/19/2020   NEUTROABS 3.7 04/19/2020   HGB 9.3 (L) 04/19/2020   HCT 27.2 (L) 04/19/2020   MCV 94.4 04/19/2020   PLT 213 04/19/2020   Lab Results  Component Value Date   IRON 154 04/19/2020   TIBC 372 04/19/2020   IRONPCTSAT 41 (H) 04/19/2020   Lab Results  Component Value Date   FERRITIN 6 (L) 04/19/2020     STUDIES: No results found.  ASSESSMENT: Iron deficiency anemia.  PLAN:    1. Iron deficiency anemia: The patient is asymptomatic her hemoglobin and iron stores have significantly decreased.  Previously, the remainder of her blood work was either negative or within normal limits.  Proceed with 510 mg IV Feraheme today.  Return to clinic in 1 week for second infusion and then in 3 months for repeat laboratory work, further evaluation, and continuation of treatment if needed.    I spent a total of 30 minutes reviewing chart  data, face-to-face evaluation with the patient, counseling and coordination of care as detailed above.   Patient expressed understanding and was in agreement with this plan. She also understands that She can call clinic at any time with any questions, concerns, or complaints.    Lloyd Huger, MD   04/20/2020 8:38 AM

## 2020-04-19 ENCOUNTER — Inpatient Hospital Stay: Payer: Medicare HMO | Attending: Oncology | Admitting: Oncology

## 2020-04-19 ENCOUNTER — Inpatient Hospital Stay: Payer: Medicare HMO

## 2020-04-19 ENCOUNTER — Other Ambulatory Visit: Payer: Self-pay

## 2020-04-19 ENCOUNTER — Encounter: Payer: Self-pay | Admitting: Oncology

## 2020-04-19 VITALS — BP 138/42 | HR 76 | Temp 98.9°F | Wt 141.7 lb

## 2020-04-19 DIAGNOSIS — Z87891 Personal history of nicotine dependence: Secondary | ICD-10-CM | POA: Diagnosis not present

## 2020-04-19 DIAGNOSIS — D509 Iron deficiency anemia, unspecified: Secondary | ICD-10-CM

## 2020-04-19 DIAGNOSIS — D508 Other iron deficiency anemias: Secondary | ICD-10-CM

## 2020-04-19 LAB — CBC WITH DIFFERENTIAL/PLATELET
Abs Immature Granulocytes: 0.02 10*3/uL (ref 0.00–0.07)
Basophils Absolute: 0.1 10*3/uL (ref 0.0–0.1)
Basophils Relative: 1 %
Eosinophils Absolute: 0.3 10*3/uL (ref 0.0–0.5)
Eosinophils Relative: 5 %
HCT: 27.2 % — ABNORMAL LOW (ref 36.0–46.0)
Hemoglobin: 9.3 g/dL — ABNORMAL LOW (ref 12.0–15.0)
Immature Granulocytes: 0 %
Lymphocytes Relative: 23 %
Lymphs Abs: 1.4 10*3/uL (ref 0.7–4.0)
MCH: 32.3 pg (ref 26.0–34.0)
MCHC: 34.2 g/dL (ref 30.0–36.0)
MCV: 94.4 fL (ref 80.0–100.0)
Monocytes Absolute: 0.4 10*3/uL (ref 0.1–1.0)
Monocytes Relative: 7 %
Neutro Abs: 3.7 10*3/uL (ref 1.7–7.7)
Neutrophils Relative %: 64 %
Platelets: 213 10*3/uL (ref 150–400)
RBC: 2.88 MIL/uL — ABNORMAL LOW (ref 3.87–5.11)
RDW: 13.1 % (ref 11.5–15.5)
WBC: 5.8 10*3/uL (ref 4.0–10.5)
nRBC: 0 % (ref 0.0–0.2)

## 2020-04-19 LAB — IRON AND TIBC
Iron: 154 ug/dL (ref 28–170)
Saturation Ratios: 41 % — ABNORMAL HIGH (ref 10.4–31.8)
TIBC: 372 ug/dL (ref 250–450)
UIBC: 218 ug/dL

## 2020-04-19 LAB — FERRITIN: Ferritin: 6 ng/mL — ABNORMAL LOW (ref 11–307)

## 2020-04-19 MED ORDER — SODIUM CHLORIDE 0.9 % IV SOLN
INTRAVENOUS | Status: DC
  Filled 2020-04-19: qty 250

## 2020-04-19 MED ORDER — SODIUM CHLORIDE 0.9 % IV SOLN
510.0000 mg | Freq: Once | INTRAVENOUS | Status: AC
  Administered 2020-04-19: 510 mg via INTRAVENOUS
  Filled 2020-04-19: qty 510

## 2020-04-19 NOTE — Progress Notes (Signed)
Pt in for follow up, denies any concerns today.  Pt was unsure about some medications and is going to take AVS print out after visit and update with medications at home.

## 2020-04-26 ENCOUNTER — Other Ambulatory Visit: Payer: Self-pay

## 2020-04-26 ENCOUNTER — Inpatient Hospital Stay: Payer: Medicare HMO

## 2020-04-26 VITALS — BP 156/66 | HR 75 | Temp 98.4°F | Resp 18

## 2020-04-26 DIAGNOSIS — D509 Iron deficiency anemia, unspecified: Secondary | ICD-10-CM | POA: Diagnosis not present

## 2020-04-26 DIAGNOSIS — Z87891 Personal history of nicotine dependence: Secondary | ICD-10-CM | POA: Diagnosis not present

## 2020-04-26 DIAGNOSIS — D508 Other iron deficiency anemias: Secondary | ICD-10-CM

## 2020-04-26 MED ORDER — SODIUM CHLORIDE 0.9 % IV SOLN
510.0000 mg | Freq: Once | INTRAVENOUS | Status: AC
  Administered 2020-04-26: 510 mg via INTRAVENOUS
  Filled 2020-04-26: qty 510

## 2020-04-26 MED ORDER — SODIUM CHLORIDE 0.9 % IV SOLN
Freq: Once | INTRAVENOUS | Status: AC
  Filled 2020-04-26: qty 250

## 2020-05-04 DIAGNOSIS — Z4689 Encounter for fitting and adjustment of other specified devices: Secondary | ICD-10-CM | POA: Diagnosis not present

## 2020-05-23 DIAGNOSIS — N1832 Chronic kidney disease, stage 3b: Secondary | ICD-10-CM | POA: Diagnosis not present

## 2020-05-23 DIAGNOSIS — I10 Essential (primary) hypertension: Secondary | ICD-10-CM | POA: Diagnosis not present

## 2020-05-23 DIAGNOSIS — E785 Hyperlipidemia, unspecified: Secondary | ICD-10-CM | POA: Diagnosis not present

## 2020-05-23 DIAGNOSIS — E1169 Type 2 diabetes mellitus with other specified complication: Secondary | ICD-10-CM | POA: Diagnosis not present

## 2020-05-23 DIAGNOSIS — E782 Mixed hyperlipidemia: Secondary | ICD-10-CM | POA: Diagnosis not present

## 2020-05-30 DIAGNOSIS — M7918 Myalgia, other site: Secondary | ICD-10-CM | POA: Diagnosis not present

## 2020-05-30 DIAGNOSIS — Z23 Encounter for immunization: Secondary | ICD-10-CM | POA: Diagnosis not present

## 2020-05-30 DIAGNOSIS — E119 Type 2 diabetes mellitus without complications: Secondary | ICD-10-CM | POA: Diagnosis not present

## 2020-05-30 DIAGNOSIS — Z0000 Encounter for general adult medical examination without abnormal findings: Secondary | ICD-10-CM | POA: Diagnosis not present

## 2020-05-30 DIAGNOSIS — I10 Essential (primary) hypertension: Secondary | ICD-10-CM | POA: Diagnosis not present

## 2020-05-30 DIAGNOSIS — E785 Hyperlipidemia, unspecified: Secondary | ICD-10-CM | POA: Diagnosis not present

## 2020-06-28 DIAGNOSIS — Z03818 Encounter for observation for suspected exposure to other biological agents ruled out: Secondary | ICD-10-CM | POA: Diagnosis not present

## 2020-06-28 DIAGNOSIS — Z1152 Encounter for screening for COVID-19: Secondary | ICD-10-CM | POA: Diagnosis not present

## 2020-06-29 DIAGNOSIS — J069 Acute upper respiratory infection, unspecified: Secondary | ICD-10-CM | POA: Diagnosis not present

## 2020-07-14 NOTE — Progress Notes (Signed)
[place]  Telephone:(336) [phone] Fax:(336) [phone]  ID: Niaomi Cartaya OB: [birth year]  MR#: [id number]  YQI#:[id number]  Patient Care Team: Dion Body, MD as PCP - General (Family Medicine) Lloyd Huger, MD as Consulting Physician (Hematology and Oncology)  CHIEF COMPLAINT:  Iron deficiency anemia.  INTERVAL HISTORY: Patient returns to clinic today for repeat laboratory work, further evaluation, and consideration of additional IV Feraheme.  She continues to feel well and remains asymptomatic.  She denies any weakness or fatigue.  She continues to be active and work full-time. She has no neurologic complaints. She denies any recent fevers or illnesses. She has a good appetite and denies weight loss.  She denies any chest pain, shortness of breath, cough, or hemoptysis.  She denies any nausea, vomiting, consultation, or diarrhea. She has no melena or hematochezia.  She has no urinary complaints.  Patient offers no specific complaints today.  REVIEW OF SYSTEMS:   Review of Systems  Constitutional: Negative.  Negative for fever, malaise/fatigue and weight loss.  Respiratory: Negative.  Negative for cough and shortness of breath.   Cardiovascular: Negative.  Negative for chest pain and leg swelling.  Gastrointestinal: Negative.  Negative for abdominal pain, blood in stool and melena.  Genitourinary: Negative.  Negative for hematuria.  Musculoskeletal: Negative.  Negative for back pain.  Skin: Negative.  Negative for rash.  Neurological: Negative.  Negative for sensory change, focal weakness and weakness.  Psychiatric/Behavioral: Negative.  The patient is not nervous/anxious.     As per HPI. Otherwise, a complete review of systems is negative.  PAST MEDICAL HISTORY: Past Medical History:  Diagnosis Date  . Diabetes mellitus without complication ([place])   . GERD (gastroesophageal reflux disease)   . Hypertension   . Insomnia   . Iron deficiency  anemia   . RLS (restless legs syndrome)     PAST SURGICAL HISTORY: Past Surgical History:  Procedure Laterality Date  . BLADDER REPAIR    . CATARACT EXTRACTION W/PHACO Right 05/13/2016   Procedure: CATARACT EXTRACTION PHACO AND INTRAOCULAR LENS PLACEMENT (IOC);  Surgeon: Birder Robson, MD;  Location: [facility] ORS;  Service: Ophthalmology;  Laterality: Right;  [place] 1.09 AP% 21.0 CDE 14.60 Fluid Pack Lot # [id number]  . CATARACT EXTRACTION W/PHACO Left 07/15/2016   Procedure: CATARACT EXTRACTION PHACO AND INTRAOCULAR LENS PLACEMENT (IOC);  Surgeon: Birder Robson, MD;  Location: [facility] ORS;  Service: Ophthalmology;  Laterality: Left;  Lot# [id number] H [place]: 01:17.9 AP%:27.8 CDE: 21.67     FAMILY HISTORY: Reviewed and unchanged. No reported history of malignancy or chronic disease.     ADVANCED DIRECTIVES:    HEALTH MAINTENANCE: Social History   Tobacco Use  . Smoking status: Former Research scientist (life sciences)  . Smokeless tobacco: Never Used  Vaping Use  . Vaping Use: Never used  Substance Use Topics  . Alcohol use: No  . Drug use: No     Colonoscopy:  PAP:  Bone density:  Lipid panel:  Allergies  Allergen Reactions  . Codeine   . Demerol [Meperidine]   . Polysaccharide Iron Complex Other (See Comments)    Caused constipation  . Naproxen Rash    Caused rash on mouth    Current Outpatient Medications  Medication Sig Dispense Refill  . ACCU-CHEK FASTCLIX LANCETS MISC     . ACCU-CHEK SMARTVIEW test strip     . amLODipine (NORVASC) 10 MG tablet Take 1 tablet by mouth daily.    Marland Kitchen glimepiride (AMARYL) 2 MG tablet TAKE 1/2 TABLET DAILY AS NEEDED IF  RANDOM GLUCOSE IS GREATER THAN 200 OR FASTING BLOOD SUGAR IS SUSTAINED GREATER THAN 130    . lisinopril-hydrochlorothiazide (PRINZIDE,ZESTORETIC) 10-12.5 MG per tablet Take 1 tablet by mouth daily.    . metFORMIN (GLUCOPHAGE) 1000 MG tablet Take 1,000 mg by mouth 2 (two) times daily with a meal.     . Multiple Vitamin (MULTIVITAMIN) capsule Take 1  capsule by mouth daily.    Marland Kitchen omeprazole (PRILOSEC) 20 MG capsule Take 20 mg by mouth daily.    . pramipexole (MIRAPEX) 0.25 MG tablet Take 1 tablet by mouth 1 day or 1 dose.    . rosuvastatin (CRESTOR) 5 MG tablet Take by mouth.    . traZODone (DESYREL) 50 MG tablet TAKE 1 TABLET BY MOUTH NIGHTLY    . vitamin E 180 MG (400 UNITS) capsule Take 400 Units by mouth daily.    Marland Kitchen alendronate (FOSAMAX) 70 MG tablet Take 1 tablet by mouth once a week. (Patient not taking: No sig reported)    . Calcium Carbonate-Vitamin D 600-400 MG-UNIT tablet Take 1 tablet by mouth 2 (two) times daily.     No current facility-administered medications for this visit.    OBJECTIVE: Vitals:   07/19/20 1359  BP: (!) 154/67  Pulse: 98  Resp: 16  Temp: 98 F (36.7 C)  SpO2: 92%     Body mass index is 26.79 kg/m.    ECOG FS:0 - Asymptomatic  General: Well-developed, well-nourished, no acute distress. Eyes: Pink conjunctiva, anicteric sclera. HEENT: Normocephalic, moist mucous membranes. Lungs: No audible wheezing or coughing. Heart: Regular rate and rhythm. Abdomen: Soft, nontender, no obvious distention. Musculoskeletal: No edema, cyanosis, or clubbing. Neuro: Alert, answering all questions appropriately. Cranial nerves grossly intact. Skin: No rashes or petechiae noted. Psych: Normal affect.  LAB RESULTS:  Lab Results  Component Value Date   NA 135 02/17/2015   K 4.5 02/17/2015   CL 102 02/17/2015   CO2 25 02/17/2015   GLUCOSE 178 (H) 02/17/2015   BUN 16 02/17/2015   CREATININE 0.91 02/17/2015   CALCIUM 9.4 02/17/2015   PROT 7.8 02/16/2015   ALBUMIN 4.0 02/16/2015   AST 27 02/16/2015   ALT 18 02/16/2015   ALKPHOS 56 02/16/2015   BILITOT 0.3 02/16/2015   GFRNONAA 56 (L) 02/17/2015   GFRAA >60 02/17/2015    Lab Results  Component Value Date   WBC 8.0 07/19/2020   NEUTROABS 6.7 07/19/2020   HGB 8.4 (L) 07/19/2020   HCT 26.2 (L) 07/19/2020   MCV 99.2 07/19/2020   PLT 198 07/19/2020    Lab Results  Component Value Date   IRON 27 (L) 07/19/2020   TIBC 385 07/19/2020   IRONPCTSAT 7 (L) 07/19/2020   Lab Results  Component Value Date   FERRITIN 14 07/19/2020     STUDIES: No results found.  ASSESSMENT: Iron deficiency anemia.  PLAN:    1. Iron deficiency anemia: Despite receiving Feraheme 3 months ago, patient's hemoglobin and iron stores have trended down. Previously, the remainder of her blood work was either negative or within normal limits.  Proceed with 510 mg IV Feraheme today.  Return to clinic in 1 week for second infusion.  Patient will then return to clinic in 3 months with repeat laboratory work, further evaluation, and consideration of treatment if needed.   I spent a total of 30 minutes reviewing chart data, face-to-face evaluation with the patient, counseling and coordination of care as detailed above.   Patient expressed understanding and was in agreement with  this plan. She also understands that She can call clinic at any time with any questions, concerns, or complaints.    Lloyd Huger, MD   07/21/2020 7:51 AM

## 2020-07-19 ENCOUNTER — Inpatient Hospital Stay: Payer: Medicare HMO

## 2020-07-19 ENCOUNTER — Encounter: Payer: Self-pay | Admitting: Oncology

## 2020-07-19 ENCOUNTER — Other Ambulatory Visit: Payer: Self-pay

## 2020-07-19 ENCOUNTER — Inpatient Hospital Stay: Payer: Medicare HMO | Attending: Oncology

## 2020-07-19 ENCOUNTER — Inpatient Hospital Stay: Payer: Medicare HMO | Admitting: Oncology

## 2020-07-19 VITALS — BP 132/47 | HR 81 | Resp 16

## 2020-07-19 VITALS — BP 154/67 | HR 98 | Temp 98.0°F | Resp 16 | Wt 141.8 lb

## 2020-07-19 DIAGNOSIS — D508 Other iron deficiency anemias: Secondary | ICD-10-CM

## 2020-07-19 DIAGNOSIS — D509 Iron deficiency anemia, unspecified: Secondary | ICD-10-CM | POA: Diagnosis not present

## 2020-07-19 DIAGNOSIS — Z87891 Personal history of nicotine dependence: Secondary | ICD-10-CM | POA: Diagnosis not present

## 2020-07-19 LAB — CBC WITH DIFFERENTIAL/PLATELET
Abs Immature Granulocytes: 0.01 10*3/uL (ref 0.00–0.07)
Basophils Absolute: 0.1 10*3/uL (ref 0.0–0.1)
Basophils Relative: 1 %
Eosinophils Absolute: 0.1 10*3/uL (ref 0.0–0.5)
Eosinophils Relative: 2 %
HCT: 26.2 % — ABNORMAL LOW (ref 36.0–46.0)
Hemoglobin: 8.4 g/dL — ABNORMAL LOW (ref 12.0–15.0)
Immature Granulocytes: 0 %
Lymphocytes Relative: 7 %
Lymphs Abs: 0.6 10*3/uL — ABNORMAL LOW (ref 0.7–4.0)
MCH: 31.8 pg (ref 26.0–34.0)
MCHC: 32.1 g/dL (ref 30.0–36.0)
MCV: 99.2 fL (ref 80.0–100.0)
Monocytes Absolute: 0.5 10*3/uL (ref 0.1–1.0)
Monocytes Relative: 6 %
Neutro Abs: 6.7 10*3/uL (ref 1.7–7.7)
Neutrophils Relative %: 84 %
Platelets: 198 10*3/uL (ref 150–400)
RBC: 2.64 MIL/uL — ABNORMAL LOW (ref 3.87–5.11)
RDW: 14.1 % (ref 11.5–15.5)
WBC: 8 10*3/uL (ref 4.0–10.5)
nRBC: 0 % (ref 0.0–0.2)

## 2020-07-19 LAB — FERRITIN: Ferritin: 14 ng/mL (ref 11–307)

## 2020-07-19 LAB — IRON AND TIBC
Iron: 27 ug/dL — ABNORMAL LOW (ref 28–170)
Saturation Ratios: 7 % — ABNORMAL LOW (ref 10.4–31.8)
TIBC: 385 ug/dL (ref 250–450)
UIBC: 358 ug/dL

## 2020-07-19 MED ORDER — SODIUM CHLORIDE 0.9 % IV SOLN
510.0000 mg | Freq: Once | INTRAVENOUS | Status: AC
  Administered 2020-07-19: 510 mg via INTRAVENOUS
  Filled 2020-07-19: qty 510

## 2020-07-19 NOTE — Progress Notes (Signed)
Tolerated feraheme well. Discharged home in stable condition. °

## 2020-07-25 ENCOUNTER — Inpatient Hospital Stay: Payer: Medicare HMO

## 2020-07-25 VITALS — BP 131/54 | HR 72 | Temp 96.8°F | Resp 18

## 2020-07-25 DIAGNOSIS — D509 Iron deficiency anemia, unspecified: Secondary | ICD-10-CM | POA: Diagnosis not present

## 2020-07-25 DIAGNOSIS — D508 Other iron deficiency anemias: Secondary | ICD-10-CM

## 2020-07-25 DIAGNOSIS — Z87891 Personal history of nicotine dependence: Secondary | ICD-10-CM | POA: Diagnosis not present

## 2020-07-25 MED ORDER — SODIUM CHLORIDE 0.9 % IV SOLN
510.0000 mg | Freq: Once | INTRAVENOUS | Status: AC
  Administered 2020-07-25: 510 mg via INTRAVENOUS
  Filled 2020-07-25: qty 510

## 2020-07-25 MED ORDER — SODIUM CHLORIDE 0.9 % IV SOLN
Freq: Once | INTRAVENOUS | Status: AC
  Filled 2020-07-25: qty 250

## 2020-07-25 NOTE — Progress Notes (Signed)
1450- Patient tolerated Feraheme infusion well. Patient declined to stay the full 30 minutes Post-Feraheme observation period. Patient and vital signs stable. Patient discharged to home at this time.

## 2020-08-07 DIAGNOSIS — Z4689 Encounter for fitting and adjustment of other specified devices: Secondary | ICD-10-CM | POA: Diagnosis not present

## 2020-08-24 ENCOUNTER — Other Ambulatory Visit: Payer: Medicare HMO

## 2020-08-24 DIAGNOSIS — Z20822 Contact with and (suspected) exposure to covid-19: Secondary | ICD-10-CM | POA: Diagnosis not present

## 2020-08-27 LAB — NOVEL CORONAVIRUS, NAA: SARS-CoV-2, NAA: NOT DETECTED

## 2020-09-14 DIAGNOSIS — G2581 Restless legs syndrome: Secondary | ICD-10-CM | POA: Diagnosis not present

## 2020-10-13 NOTE — Progress Notes (Signed)
[place]  Telephone:(336) [phone] Fax:(336) [phone]  ID: Josslynn Barrese OB: [birth year]  MR#: [id number]  [id number]  Patient Care Team: Dion Body, MD as PCP - General (Family Medicine) Lloyd Huger, MD as Consulting Physician (Hematology and Oncology)  CHIEF COMPLAINT:  Iron deficiency anemia.  INTERVAL HISTORY: Patient returns to clinic today for repeat laboratory work, further evaluation, and consideration of additional IV Feraheme.  She continues to feel well and remains asymptomatic.  She continues to active and work full-time.  She denies any weakness or fatigue. She has no neurologic complaints. She denies any recent fevers or illnesses. She has a good appetite and denies weight loss.  She denies any chest pain, shortness of breath, cough, or hemoptysis.  She denies any nausea, vomiting, consultation, or diarrhea. She has no melena or hematochezia.  She has no urinary complaints.  Patient feels at her baseline offers no specific complaints today.  REVIEW OF SYSTEMS:   Review of Systems  Constitutional: Negative.  Negative for fever, malaise/fatigue and weight loss.  Respiratory: Negative.  Negative for cough and shortness of breath.   Cardiovascular: Negative.  Negative for chest pain and leg swelling.  Gastrointestinal: Negative.  Negative for abdominal pain, blood in stool and melena.  Genitourinary: Negative.  Negative for hematuria.  Musculoskeletal: Negative.  Negative for back pain.  Skin: Negative.  Negative for rash.  Neurological: Negative.  Negative for sensory change, focal weakness and weakness.  Psychiatric/Behavioral: Negative.  The patient is not nervous/anxious.     As per HPI. Otherwise, a complete review of systems is negative.  PAST MEDICAL HISTORY: Past Medical History:  Diagnosis Date  . Diabetes mellitus without complication ([place])   . GERD (gastroesophageal reflux disease)   . Hypertension   . Insomnia    . Iron deficiency anemia   . RLS (restless legs syndrome)     PAST SURGICAL HISTORY: Past Surgical History:  Procedure Laterality Date  . BLADDER REPAIR    . CATARACT EXTRACTION W/PHACO Right 05/13/2016   Procedure: CATARACT EXTRACTION PHACO AND INTRAOCULAR LENS PLACEMENT (IOC);  Surgeon: Birder Robson, MD;  Location: [facility] ORS;  Service: Ophthalmology;  Laterality: Right;  [place] 1.09 AP% 21.0 CDE 14.60 Fluid Pack Lot # [id number]  . CATARACT EXTRACTION W/PHACO Left 07/15/2016   Procedure: CATARACT EXTRACTION PHACO AND INTRAOCULAR LENS PLACEMENT (IOC);  Surgeon: Birder Robson, MD;  Location: [facility] ORS;  Service: Ophthalmology;  Laterality: Left;  Lot# [id number] H [place]: 01:17.9 AP%:27.8 CDE: 21.67     FAMILY HISTORY: Reviewed and unchanged. No reported history of malignancy or chronic disease.     ADVANCED DIRECTIVES:    HEALTH MAINTENANCE: Social History   Tobacco Use  . Smoking status: Former Research scientist (life sciences)  . Smokeless tobacco: Never Used  Vaping Use  . Vaping Use: Never used  Substance Use Topics  . Alcohol use: No  . Drug use: No     Colonoscopy:  PAP:  Bone density:  Lipid panel:  Allergies  Allergen Reactions  . Codeine   . Demerol [Meperidine]   . Polysaccharide Iron Complex Other (See Comments)    Caused constipation  . Naproxen Rash    Caused rash on mouth    Current Outpatient Medications  Medication Sig Dispense Refill  . amLODipine (NORVASC) 10 MG tablet Take 1 tablet by mouth daily.    Marland Kitchen glimepiride (AMARYL) 2 MG tablet TAKE 1/2 TABLET DAILY AS NEEDED IF RANDOM GLUCOSE IS GREATER THAN 200 OR FASTING BLOOD SUGAR IS SUSTAINED GREATER THAN 130    .  lisinopril-hydrochlorothiazide (PRINZIDE,ZESTORETIC) 10-12.5 MG per tablet Take 1 tablet by mouth daily.    . metFORMIN (GLUCOPHAGE) 1000 MG tablet Take 1,000 mg by mouth 2 (two) times daily with a meal.     . Multiple Vitamin (MULTIVITAMIN) capsule Take 1 capsule by mouth daily.    Marland Kitchen omeprazole (PRILOSEC) 20 MG  capsule Take 20 mg by mouth daily.    . pramipexole (MIRAPEX) 0.25 MG tablet Take 1 tablet by mouth 1 day or 1 dose.    . rosuvastatin (CRESTOR) 5 MG tablet Take by mouth.    . traZODone (DESYREL) 50 MG tablet TAKE 1 TABLET BY MOUTH NIGHTLY    . ACCU-CHEK FASTCLIX LANCETS MISC     . ACCU-CHEK SMARTVIEW test strip     . alendronate (FOSAMAX) 70 MG tablet Take 1 tablet by mouth once a week. (Patient not taking: No sig reported)    . Calcium Carbonate-Vitamin D 600-400 MG-UNIT tablet Take 1 tablet by mouth 2 (two) times daily. (Patient not taking: Reported on 10/18/2020)    . vitamin E 180 MG (400 UNITS) capsule Take 400 Units by mouth daily. (Patient not taking: Reported on 10/18/2020)     No current facility-administered medications for this visit.    OBJECTIVE: Vitals:   10/18/20 1336  BP: (!) 154/57  Pulse: 75  Temp: (!) 97.5 F (36.4 C)  SpO2: 93%     Body mass index is 26.68 kg/m.    ECOG FS:0 - Asymptomatic  General: Well-developed, well-nourished, no acute distress. Eyes: Pink conjunctiva, anicteric sclera. HEENT: Normocephalic, moist mucous membranes. Lungs: No audible wheezing or coughing. Heart: Regular rate and rhythm. Abdomen: Soft, nontender, no obvious distention. Musculoskeletal: No edema, cyanosis, or clubbing. Neuro: Alert, answering all questions appropriately. Cranial nerves grossly intact. Skin: No rashes or petechiae noted. Psych: Normal affect.  LAB RESULTS:  Lab Results  Component Value Date   NA 135 02/17/2015   K 4.5 02/17/2015   CL 102 02/17/2015   CO2 25 02/17/2015   GLUCOSE 178 (H) 02/17/2015   BUN 16 02/17/2015   CREATININE 0.91 02/17/2015   CALCIUM 9.4 02/17/2015   PROT 7.8 02/16/2015   ALBUMIN 4.0 02/16/2015   AST 27 02/16/2015   ALT 18 02/16/2015   ALKPHOS 56 02/16/2015   BILITOT 0.3 02/16/2015   GFRNONAA 56 (L) 02/17/2015   GFRAA >60 02/17/2015    Lab Results  Component Value Date   WBC 5.1 10/17/2020   NEUTROABS 3.4 10/17/2020    HGB 9.1 (L) 10/17/2020   HCT 27.8 (L) 10/17/2020   MCV 96.9 10/17/2020   PLT 197 10/17/2020   Lab Results  Component Value Date   IRON 223 (H) 10/17/2020   TIBC 382 10/17/2020   IRONPCTSAT 58 (H) 10/17/2020   Lab Results  Component Value Date   FERRITIN 10 (L) 10/17/2020     STUDIES: No results found.  ASSESSMENT: Iron deficiency anemia.  PLAN:    1. Iron deficiency anemia: Patient's hemoglobin remains decreased, but mildly improved to 9.1.  Iron stores are decreased, but stable. Previously, the remainder of her blood work was either negative or within normal limits.  Because patient is asymptomatic, she does not wish to pursue IV Feraheme today.  She last received treatment on July 25, 2020.  No intervention is needed.  Return to clinic in 3 months with repeat laboratory work, further evaluation, and consideration of additional IV iron if needed.    I spent a total of 20 minutes reviewing chart data, face-to-face  evaluation with the patient, counseling and coordination of care as detailed above.   Patient expressed understanding and was in agreement with this plan. She also understands that She can call clinic at any time with any questions, concerns, or complaints.    Lloyd Huger, MD   10/19/2020 7:32 AM

## 2020-10-17 ENCOUNTER — Inpatient Hospital Stay: Payer: Medicare HMO | Attending: Oncology

## 2020-10-17 ENCOUNTER — Other Ambulatory Visit: Payer: Self-pay

## 2020-10-17 DIAGNOSIS — Z79899 Other long term (current) drug therapy: Secondary | ICD-10-CM | POA: Insufficient documentation

## 2020-10-17 DIAGNOSIS — Z87891 Personal history of nicotine dependence: Secondary | ICD-10-CM | POA: Diagnosis not present

## 2020-10-17 DIAGNOSIS — G2581 Restless legs syndrome: Secondary | ICD-10-CM | POA: Diagnosis not present

## 2020-10-17 DIAGNOSIS — E119 Type 2 diabetes mellitus without complications: Secondary | ICD-10-CM | POA: Diagnosis not present

## 2020-10-17 DIAGNOSIS — Z7984 Long term (current) use of oral hypoglycemic drugs: Secondary | ICD-10-CM | POA: Diagnosis not present

## 2020-10-17 DIAGNOSIS — D509 Iron deficiency anemia, unspecified: Secondary | ICD-10-CM | POA: Diagnosis not present

## 2020-10-17 DIAGNOSIS — I10 Essential (primary) hypertension: Secondary | ICD-10-CM | POA: Insufficient documentation

## 2020-10-17 LAB — CBC WITH DIFFERENTIAL/PLATELET
Basophils Absolute: 0.1 10*3/uL (ref 0.0–0.1)
Basophils Relative: 1 %
Eosinophils Absolute: 0.2 10*3/uL (ref 0.0–0.5)
Eosinophils Relative: 4 %
HCT: 27.8 % — ABNORMAL LOW (ref 36.0–46.0)
Hemoglobin: 9.1 g/dL — ABNORMAL LOW (ref 12.0–15.0)
Lymphocytes Relative: 23 %
Lymphs Abs: 1.2 10*3/uL (ref 0.7–4.0)
MCH: 31.7 pg (ref 26.0–34.0)
MCHC: 32.7 g/dL (ref 30.0–36.0)
MCV: 96.9 fL (ref 80.0–100.0)
Monocytes Absolute: 0.3 10*3/uL (ref 0.1–1.0)
Monocytes Relative: 6 %
Neutro Abs: 3.4 10*3/uL (ref 1.7–7.7)
Neutrophils Relative %: 66 %
Platelets: 197 10*3/uL (ref 150–400)
RBC: 2.87 MIL/uL — ABNORMAL LOW (ref 3.87–5.11)
RDW: 12.9 % (ref 11.5–15.5)
WBC: 5.1 10*3/uL (ref 4.0–10.5)

## 2020-10-17 LAB — IRON AND TIBC
Iron: 223 ug/dL — ABNORMAL HIGH (ref 28–170)
Saturation Ratios: 58 % — ABNORMAL HIGH (ref 10.4–31.8)
TIBC: 382 ug/dL (ref 250–450)
UIBC: 159 ug/dL

## 2020-10-17 LAB — FERRITIN: Ferritin: 10 ng/mL — ABNORMAL LOW (ref 11–307)

## 2020-10-18 ENCOUNTER — Encounter: Payer: Self-pay | Admitting: Oncology

## 2020-10-18 ENCOUNTER — Inpatient Hospital Stay: Payer: Medicare HMO

## 2020-10-18 ENCOUNTER — Inpatient Hospital Stay (HOSPITAL_BASED_OUTPATIENT_CLINIC_OR_DEPARTMENT_OTHER): Payer: Medicare HMO | Admitting: Oncology

## 2020-10-18 VITALS — BP 154/57 | HR 75 | Temp 97.5°F | Wt 141.2 lb

## 2020-10-18 DIAGNOSIS — Z79899 Other long term (current) drug therapy: Secondary | ICD-10-CM | POA: Diagnosis not present

## 2020-10-18 DIAGNOSIS — G2581 Restless legs syndrome: Secondary | ICD-10-CM | POA: Diagnosis not present

## 2020-10-18 DIAGNOSIS — Z7984 Long term (current) use of oral hypoglycemic drugs: Secondary | ICD-10-CM | POA: Diagnosis not present

## 2020-10-18 DIAGNOSIS — E119 Type 2 diabetes mellitus without complications: Secondary | ICD-10-CM | POA: Diagnosis not present

## 2020-10-18 DIAGNOSIS — D509 Iron deficiency anemia, unspecified: Secondary | ICD-10-CM | POA: Diagnosis not present

## 2020-10-18 DIAGNOSIS — Z87891 Personal history of nicotine dependence: Secondary | ICD-10-CM | POA: Diagnosis not present

## 2020-10-18 DIAGNOSIS — I10 Essential (primary) hypertension: Secondary | ICD-10-CM | POA: Diagnosis not present

## 2020-10-18 NOTE — Progress Notes (Signed)
Patient here for oncology follow-up appointment, expresses no complaints or concerns at this time.    

## 2020-11-20 DIAGNOSIS — E119 Type 2 diabetes mellitus without complications: Secondary | ICD-10-CM | POA: Diagnosis not present

## 2020-11-22 DIAGNOSIS — E785 Hyperlipidemia, unspecified: Secondary | ICD-10-CM | POA: Diagnosis not present

## 2020-11-22 DIAGNOSIS — N1831 Chronic kidney disease, stage 3a: Secondary | ICD-10-CM | POA: Diagnosis not present

## 2020-11-22 DIAGNOSIS — Z862 Personal history of diseases of the blood and blood-forming organs and certain disorders involving the immune mechanism: Secondary | ICD-10-CM | POA: Diagnosis not present

## 2020-11-22 DIAGNOSIS — E782 Mixed hyperlipidemia: Secondary | ICD-10-CM | POA: Diagnosis not present

## 2020-11-22 DIAGNOSIS — I10 Essential (primary) hypertension: Secondary | ICD-10-CM | POA: Diagnosis not present

## 2020-11-22 DIAGNOSIS — E1169 Type 2 diabetes mellitus with other specified complication: Secondary | ICD-10-CM | POA: Diagnosis not present

## 2020-11-27 ENCOUNTER — Emergency Department: Payer: Medicare HMO

## 2020-11-27 ENCOUNTER — Inpatient Hospital Stay
Admit: 2020-11-27 | Discharge: 2020-11-27 | Disposition: A | Discharge status: Home / Self Care | Payer: Medicare HMO | Attending: Family Medicine | Admitting: Family Medicine

## 2020-11-27 ENCOUNTER — Inpatient Hospital Stay: Payer: Medicare HMO

## 2020-11-27 ENCOUNTER — Inpatient Hospital Stay
Admission: EM | Admit: 2020-11-27 | Discharge: 2020-11-29 | DRG: 291 | Disposition: A | Discharge status: Home / Self Care | Payer: Medicare HMO | Attending: Internal Medicine | Admitting: Internal Medicine

## 2020-11-27 ENCOUNTER — Other Ambulatory Visit: Payer: Self-pay

## 2020-11-27 DIAGNOSIS — Z79899 Other long term (current) drug therapy: Secondary | ICD-10-CM

## 2020-11-27 DIAGNOSIS — J9811 Atelectasis: Secondary | ICD-10-CM | POA: Diagnosis not present

## 2020-11-27 DIAGNOSIS — K219 Gastro-esophageal reflux disease without esophagitis: Secondary | ICD-10-CM | POA: Diagnosis not present

## 2020-11-27 DIAGNOSIS — T501X5A Adverse effect of loop [high-ceiling] diuretics, initial encounter: Secondary | ICD-10-CM | POA: Diagnosis present

## 2020-11-27 DIAGNOSIS — Z7984 Long term (current) use of oral hypoglycemic drugs: Secondary | ICD-10-CM | POA: Diagnosis not present

## 2020-11-27 DIAGNOSIS — R0689 Other abnormalities of breathing: Secondary | ICD-10-CM | POA: Diagnosis not present

## 2020-11-27 DIAGNOSIS — D649 Anemia, unspecified: Secondary | ICD-10-CM | POA: Diagnosis present

## 2020-11-27 DIAGNOSIS — Z7983 Long term (current) use of bisphosphonates: Secondary | ICD-10-CM

## 2020-11-27 DIAGNOSIS — I10 Essential (primary) hypertension: Secondary | ICD-10-CM | POA: Diagnosis not present

## 2020-11-27 DIAGNOSIS — R609 Edema, unspecified: Secondary | ICD-10-CM | POA: Diagnosis not present

## 2020-11-27 DIAGNOSIS — R7989 Other specified abnormal findings of blood chemistry: Secondary | ICD-10-CM | POA: Diagnosis not present

## 2020-11-27 DIAGNOSIS — E782 Mixed hyperlipidemia: Secondary | ICD-10-CM | POA: Diagnosis present

## 2020-11-27 DIAGNOSIS — I110 Hypertensive heart disease with heart failure: Secondary | ICD-10-CM | POA: Diagnosis not present

## 2020-11-27 DIAGNOSIS — I350 Nonrheumatic aortic (valve) stenosis: Secondary | ICD-10-CM | POA: Diagnosis present

## 2020-11-27 DIAGNOSIS — R0902 Hypoxemia: Secondary | ICD-10-CM | POA: Diagnosis not present

## 2020-11-27 DIAGNOSIS — Z87891 Personal history of nicotine dependence: Secondary | ICD-10-CM | POA: Diagnosis not present

## 2020-11-27 DIAGNOSIS — R0602 Shortness of breath: Secondary | ICD-10-CM | POA: Diagnosis not present

## 2020-11-27 DIAGNOSIS — I130 Hypertensive heart and chronic kidney disease with heart failure and stage 1 through stage 4 chronic kidney disease, or unspecified chronic kidney disease: Principal | ICD-10-CM | POA: Diagnosis present

## 2020-11-27 DIAGNOSIS — Z66 Do not resuscitate: Secondary | ICD-10-CM | POA: Diagnosis present

## 2020-11-27 DIAGNOSIS — R062 Wheezing: Secondary | ICD-10-CM | POA: Diagnosis not present

## 2020-11-27 DIAGNOSIS — E1122 Type 2 diabetes mellitus with diabetic chronic kidney disease: Secondary | ICD-10-CM | POA: Diagnosis present

## 2020-11-27 DIAGNOSIS — N189 Chronic kidney disease, unspecified: Secondary | ICD-10-CM | POA: Diagnosis not present

## 2020-11-27 DIAGNOSIS — J9601 Acute respiratory failure with hypoxia: Secondary | ICD-10-CM | POA: Diagnosis present

## 2020-11-27 DIAGNOSIS — G2581 Restless legs syndrome: Secondary | ICD-10-CM | POA: Diagnosis present

## 2020-11-27 DIAGNOSIS — E1142 Type 2 diabetes mellitus with diabetic polyneuropathy: Secondary | ICD-10-CM | POA: Diagnosis present

## 2020-11-27 DIAGNOSIS — N179 Acute kidney failure, unspecified: Secondary | ICD-10-CM | POA: Diagnosis present

## 2020-11-27 DIAGNOSIS — Z20822 Contact with and (suspected) exposure to covid-19: Secondary | ICD-10-CM | POA: Diagnosis present

## 2020-11-27 DIAGNOSIS — N1832 Chronic kidney disease, stage 3b: Secondary | ICD-10-CM | POA: Diagnosis present

## 2020-11-27 DIAGNOSIS — I5033 Acute on chronic diastolic (congestive) heart failure: Secondary | ICD-10-CM | POA: Diagnosis not present

## 2020-11-27 DIAGNOSIS — M7989 Other specified soft tissue disorders: Secondary | ICD-10-CM | POA: Diagnosis not present

## 2020-11-27 DIAGNOSIS — I517 Cardiomegaly: Secondary | ICD-10-CM | POA: Diagnosis not present

## 2020-11-27 DIAGNOSIS — I5031 Acute diastolic (congestive) heart failure: Secondary | ICD-10-CM | POA: Diagnosis not present

## 2020-11-27 DIAGNOSIS — I509 Heart failure, unspecified: Secondary | ICD-10-CM

## 2020-11-27 DIAGNOSIS — E119 Type 2 diabetes mellitus without complications: Secondary | ICD-10-CM

## 2020-11-27 DIAGNOSIS — J90 Pleural effusion, not elsewhere classified: Secondary | ICD-10-CM | POA: Diagnosis not present

## 2020-11-27 LAB — CBG MONITORING, ED
Glucose-Capillary: 199 mg/dL — ABNORMAL HIGH (ref 70–99)
Glucose-Capillary: 325 mg/dL — ABNORMAL HIGH (ref 70–99)
Glucose-Capillary: 238 mg/dL — ABNORMAL HIGH (ref 70–99)

## 2020-11-27 LAB — ECHOCARDIOGRAM COMPLETE
AR max vel: 1.01 cm2
AV Area VTI: 1.05 cm2
AV Area mean vel: 1.08 cm2
AV Mean grad: 32 mmHg
AV Peak grad: 62.1 mmHg
Ao pk vel: 3.94 m/s
Area-P 1/2: 6.65 cm2
Height: 61 in
MV VTI: 2.69 cm2
S' Lateral: 2.5 cm
Weight: 2373.91 oz

## 2020-11-27 LAB — BLOOD GAS, ARTERIAL
Acid-base deficit: 2.6 mmol/L — ABNORMAL HIGH (ref 0.0–2.0)
Bicarbonate: 22.6 mmol/L (ref 20.0–28.0)
Delivery systems: POSITIVE
Expiratory PAP: 5
FIO2: 0.5
Inspiratory PAP: 10
O2 Saturation: 98 %
Patient temperature: 37
pCO2 arterial: 40 mmHg (ref 32.0–48.0)
pH, Arterial: 7.36 (ref 7.350–7.450)
pO2, Arterial: 109 mmHg — ABNORMAL HIGH (ref 83.0–108.0)

## 2020-11-27 LAB — BASIC METABOLIC PANEL
Anion gap: 9 (ref 5–15)
BUN: 22 mg/dL (ref 8–23)
CO2: 24 mmol/L (ref 22–32)
Calcium: 9.4 mg/dL (ref 8.9–10.3)
Chloride: 106 mmol/L (ref 98–111)
Creatinine, Ser: 1.4 mg/dL — ABNORMAL HIGH (ref 0.44–1.00)
GFR, Estimated: 36 mL/min — ABNORMAL LOW (ref >60)
Glucose, Bld: 186 mg/dL — ABNORMAL HIGH (ref 70–99)
Potassium: 3.9 mmol/L (ref 3.5–5.1)
Sodium: 139 mmol/L (ref 135–145)

## 2020-11-27 LAB — CBC WITH DIFFERENTIAL/PLATELET
Abs Immature Granulocytes: 0.03 10*3/uL (ref 0.00–0.07)
Basophils Absolute: 0.1 10*3/uL (ref 0.0–0.1)
Basophils Relative: 1 %
Eosinophils Absolute: 0.3 10*3/uL (ref 0.0–0.5)
Eosinophils Relative: 4 %
HCT: 27.8 % — ABNORMAL LOW (ref 36.0–46.0)
Hemoglobin: 8.8 g/dL — ABNORMAL LOW (ref 12.0–15.0)
Immature Granulocytes: 0 %
Lymphocytes Relative: 18 %
Lymphs Abs: 1.4 10*3/uL (ref 0.7–4.0)
MCH: 30.6 pg (ref 26.0–34.0)
MCHC: 31.7 g/dL (ref 30.0–36.0)
MCV: 96.5 fL (ref 80.0–100.0)
Monocytes Absolute: 0.4 10*3/uL (ref 0.1–1.0)
Monocytes Relative: 6 %
Neutro Abs: 5.3 10*3/uL (ref 1.7–7.7)
Neutrophils Relative %: 71 %
Platelets: 226 10*3/uL (ref 150–400)
RBC: 2.88 MIL/uL — ABNORMAL LOW (ref 3.87–5.11)
RDW: 13.2 % (ref 11.5–15.5)
WBC: 7.5 10*3/uL (ref 4.0–10.5)
nRBC: 0 % (ref 0.0–0.2)

## 2020-11-27 LAB — RESP PANEL BY RT-PCR (FLU A&B, COVID) ARPGX2
Influenza A by PCR: NEGATIVE
Influenza B by PCR: NEGATIVE
SARS Coronavirus 2 by RT PCR: NEGATIVE

## 2020-11-27 LAB — HEMOGLOBIN A1C
Hgb A1c MFr Bld: 5.6 % (ref 4.8–5.6)
Mean Plasma Glucose: 114.02 mg/dL

## 2020-11-27 LAB — GLUCOSE, CAPILLARY: Glucose-Capillary: 143 mg/dL — ABNORMAL HIGH (ref 70–99)

## 2020-11-27 LAB — D-DIMER, QUANTITATIVE: D-Dimer, Quant: 1.17 ug/mL-FEU — ABNORMAL HIGH (ref 0.00–0.50)

## 2020-11-27 LAB — TROPONIN I (HIGH SENSITIVITY)
Troponin I (High Sensitivity): 17 ng/L (ref <18)
Troponin I (High Sensitivity): 50 ng/L — ABNORMAL HIGH (ref <18)

## 2020-11-27 LAB — BRAIN NATRIURETIC PEPTIDE: B Natriuretic Peptide: 288.2 pg/mL — ABNORMAL HIGH (ref 0.0–100.0)

## 2020-11-27 MED ORDER — PRAMIPEXOLE DIHYDROCHLORIDE 0.25 MG PO TABS
0.2500 mg | ORAL_TABLET | Freq: Every day | ORAL | Status: DC
  Administered 2020-11-27 – 2020-11-28 (×2): 0.25 mg via ORAL
  Filled 2020-11-27 (×4): qty 1

## 2020-11-27 MED ORDER — VITAMIN E 180 MG (400 UNIT) PO CAPS: 400.0000 [IU] | ORAL_CAPSULE | Freq: Every day | ORAL | Status: DC

## 2020-11-27 MED ORDER — IPRATROPIUM BROMIDE 0.02 % IN SOLN
0.5000 mg | Freq: Once | RESPIRATORY_TRACT | Status: AC
  Administered 2020-11-27: 0.5 mg via RESPIRATORY_TRACT
  Filled 2020-11-27: qty 2.5

## 2020-11-27 MED ORDER — ROSUVASTATIN CALCIUM 5 MG PO TABS
5.0000 mg | ORAL_TABLET | Freq: Every day | ORAL | Status: DC
  Administered 2020-11-27 – 2020-11-28 (×2): 5 mg via ORAL
  Filled 2020-11-27 (×3): qty 1

## 2020-11-27 MED ORDER — TRAZODONE HCL 50 MG PO TABS: 25.0000 mg | ORAL_TABLET | Freq: Every evening | ORAL | Status: DC | PRN

## 2020-11-27 MED ORDER — LISINOPRIL-HYDROCHLOROTHIAZIDE 10-12.5 MG PO TABS: 1.0000 | ORAL_TABLET | Freq: Every day | ORAL | Status: DC

## 2020-11-27 MED ORDER — POTASSIUM CHLORIDE CRYS ER 20 MEQ PO TBCR
40.0000 meq | EXTENDED_RELEASE_TABLET | Freq: Every day | ORAL | Status: DC
  Administered 2020-11-27 – 2020-11-29 (×3): 40 meq via ORAL
  Filled 2020-11-27 (×2): qty 2

## 2020-11-27 MED ORDER — ONDANSETRON HCL 4 MG PO TABS: 4.0000 mg | ORAL_TABLET | Freq: Four times a day (QID) | ORAL | Status: DC | PRN

## 2020-11-27 MED ORDER — PANTOPRAZOLE SODIUM 40 MG PO TBEC
40.0000 mg | DELAYED_RELEASE_TABLET | Freq: Every day | ORAL | Status: DC
  Administered 2020-11-27 – 2020-11-28 (×2): 40 mg via ORAL
  Filled 2020-11-27 (×2): qty 1

## 2020-11-27 MED ORDER — MAGNESIUM HYDROXIDE 400 MG/5ML PO SUSP: 30.0000 mL | Freq: Every day | ORAL | Status: DC | PRN

## 2020-11-27 MED ORDER — METHYLPREDNISOLONE SODIUM SUCC 125 MG IJ SOLR
125.0000 mg | Freq: Once | INTRAMUSCULAR | Status: AC
  Administered 2020-11-27: 125 mg via INTRAVENOUS
  Filled 2020-11-27: qty 2

## 2020-11-27 MED ORDER — FUROSEMIDE 10 MG/ML IJ SOLN
20.0000 mg | Freq: Two times a day (BID) | INTRAMUSCULAR | Status: DC
  Administered 2020-11-27 – 2020-11-29 (×4): 20 mg via INTRAVENOUS
  Filled 2020-11-27 (×2): qty 2
  Filled 2020-11-27: qty 4
  Filled 2020-11-27: qty 2

## 2020-11-27 MED ORDER — FUROSEMIDE 10 MG/ML IJ SOLN: 40.0000 mg | Freq: Two times a day (BID) | INTRAMUSCULAR | Status: DC

## 2020-11-27 MED ORDER — ADULT MULTIVITAMIN W/MINERALS CH
1.0000 | ORAL_TABLET | Freq: Every day | ORAL | Status: DC
  Administered 2020-11-27 – 2020-11-29 (×3): 1 via ORAL
  Filled 2020-11-27 (×3): qty 1

## 2020-11-27 MED ORDER — ENOXAPARIN SODIUM 30 MG/0.3ML ~~LOC~~ SOLN
30.0000 mg | SUBCUTANEOUS | Status: DC
  Administered 2020-11-27 – 2020-11-29 (×3): 30 mg via SUBCUTANEOUS
  Filled 2020-11-27 (×4): qty 0.3

## 2020-11-27 MED ORDER — GLIMEPIRIDE 2 MG PO TABS
2.0000 mg | ORAL_TABLET | Freq: Every day | ORAL | Status: DC
  Filled 2020-11-27: qty 1

## 2020-11-27 MED ORDER — IOHEXOL 350 MG/ML SOLN
50.0000 mL | Freq: Once | INTRAVENOUS | Status: AC | PRN
  Administered 2020-11-27: 50 mL via INTRAVENOUS

## 2020-11-27 MED ORDER — ONDANSETRON HCL 4 MG/2ML IJ SOLN: 4.0000 mg | Freq: Four times a day (QID) | INTRAMUSCULAR | Status: DC | PRN

## 2020-11-27 MED ORDER — ACETAMINOPHEN 325 MG PO TABS
650.0000 mg | ORAL_TABLET | Freq: Four times a day (QID) | ORAL | Status: DC | PRN
  Administered 2020-11-27 – 2020-11-28 (×2): 650 mg via ORAL
  Filled 2020-11-27 (×2): qty 2

## 2020-11-27 MED ORDER — ALBUTEROL SULFATE (2.5 MG/3ML) 0.083% IN NEBU
5.0000 mg | INHALATION_SOLUTION | Freq: Once | RESPIRATORY_TRACT | Status: AC
  Administered 2020-11-27: 5 mg via RESPIRATORY_TRACT
  Filled 2020-11-27: qty 6

## 2020-11-27 MED ORDER — AMLODIPINE BESYLATE 5 MG PO TABS
10.0000 mg | ORAL_TABLET | Freq: Every day | ORAL | Status: DC
  Administered 2020-11-27: 10 mg via ORAL
  Filled 2020-11-27: qty 2

## 2020-11-27 MED ORDER — ACETAMINOPHEN 650 MG RE SUPP: 650.0000 mg | Freq: Four times a day (QID) | RECTAL | Status: DC | PRN

## 2020-11-27 MED ORDER — FUROSEMIDE 10 MG/ML IJ SOLN
40.0000 mg | Freq: Once | INTRAMUSCULAR | Status: AC
  Administered 2020-11-27: 40 mg via INTRAVENOUS
  Filled 2020-11-27: qty 4

## 2020-11-27 MED ORDER — INSULIN ASPART 100 UNIT/ML ~~LOC~~ SOLN
0.0000 [IU] | Freq: Three times a day (TID) | SUBCUTANEOUS | Status: DC
  Administered 2020-11-27: 2 [IU] via SUBCUTANEOUS
  Administered 2020-11-27: 3 [IU] via SUBCUTANEOUS
  Administered 2020-11-27: 7 [IU] via SUBCUTANEOUS
  Administered 2020-11-27: 1 [IU] via SUBCUTANEOUS
  Administered 2020-11-28 (×3): 2 [IU] via SUBCUTANEOUS
  Administered 2020-11-28: 1 [IU] via SUBCUTANEOUS
  Administered 2020-11-29: 3 [IU] via SUBCUTANEOUS
  Administered 2020-11-29: 1 [IU] via SUBCUTANEOUS
  Filled 2020-11-27 (×10): qty 1

## 2020-11-27 MED ORDER — TRAZODONE HCL 50 MG PO TABS
50.0000 mg | ORAL_TABLET | Freq: Every day | ORAL | Status: DC
  Administered 2020-11-27 – 2020-11-28 (×2): 50 mg via ORAL
  Filled 2020-11-27 (×2): qty 1

## 2020-11-27 NOTE — ED Notes (Signed)
Pt noted to soil linens. Pt linens changed and placed in new brief.

## 2020-11-27 NOTE — ED Notes (Signed)
X-ray at bedside

## 2020-11-27 NOTE — ED Triage Notes (Signed)
EMS brought in from home for difficulty breathing that started today at 0200. Denies chest pain. Tachypnic. Audible wheezing. Per EMS initial SPO2 on room air 68%, patient given albuterol treatment x1 and placed on NRB 15L O2. AAOx4.

## 2020-11-27 NOTE — Progress Notes (Signed)
PHARMACIST - PHYSICIAN COMMUNICATION  CONCERNING:  Enoxaparin (Lovenox) for DVT Prophylaxis    RECOMMENDATION: Patient was prescribed enoxaprin '40mg'$  q24 hours for VTE prophylaxis.   Filed Weights   11/27/20 0415  Weight: 67.3 kg (148 lb 5.9 oz)    Body mass index is 28.03 kg/m.  Estimated Creatinine Clearance: 23.4 mL/min (A) (by C-G formula based on SCr of 1.4 mg/dL (H)).   Patient is candidate for enoxaparin '30mg'$  every 24 hours based on CrCl <28m/min or Weight <45kg  DESCRIPTION: [facility]  Patient is now receiving enoxaparin 30 mg every 24 hours   NRenda Rolls PharmD, [facility]4/19/2022 5:52 AM

## 2020-11-27 NOTE — ED Notes (Signed)
Patient placed on BIPAP by RT.

## 2020-11-27 NOTE — ED Provider Notes (Signed)
[facility] Emergency Department Provider Note  ____________________________________________   Event Date/Time   First MD Initiated Contact with Patient 11/27/20 [id number]     (approximate)  I have reviewed the triage vital signs and the nursing notes.   HISTORY  Chief Complaint Shortness of Breath    HPI Lisa Mcneil is a 87 y.o. female with history of hypertension, diabetes who presents to the emergency department with EMS for concerns for shortness of breath, hypoxia and wheezing.  Patient states that started feeling short of breath at 2 AM while trying to go to bed.  She did not have any symptoms previous to this and was feeling fine.  No chest pain or chest discomfort, fever, cough.  Was wheezing with EMS.  Sats in the 60s on fire department arrival.  Patient arrives on a nonrebreather.  Patient did receive 1 DuoNeb in route.  She denies history of asthma, COPD, CHF, PE, DVT.  She was previously a smoker and quit 20 years ago.  She has never had anything like this happen to her before.  She does report recently she has had intermittent right leg swelling but no pain.        Past Medical History:  Diagnosis Date  . Diabetes mellitus without complication ([place])   . GERD (gastroesophageal reflux disease)   . Hypertension   . Insomnia   . Iron deficiency anemia   . RLS (restless legs syndrome)     Patient Active Problem List   Diagnosis Date Noted  . Systolic murmur [id number]  . Localized osteoporosis without current pathological fracture 04/26/2019  . CKD (chronic kidney disease) stage 3, GFR 30-59 ml/min (HCC) 04/20/2018  . Diabetes mellitus without complication ([place]) [id number]  . Essential hypertension 11/05/2017  . Iron deficiency 11/05/2017  . Mixed hyperlipidemia 11/05/2017  . Primary insomnia 05/07/2017  . GERD without esophagitis 08/22/2015  . Iron deficiency anemia 02/16/2015    Past Surgical History:  Procedure Laterality Date   . BLADDER REPAIR    . CATARACT EXTRACTION W/PHACO Right 05/13/2016   Procedure: CATARACT EXTRACTION PHACO AND INTRAOCULAR LENS PLACEMENT (IOC);  Surgeon: Birder Robson, MD;  Location: [facility] ORS;  Service: Ophthalmology;  Laterality: Right;  [place] 1.09 AP% 21.0 CDE 14.60 Fluid Pack Lot # [id number]  . CATARACT EXTRACTION W/PHACO Left 07/15/2016   Procedure: CATARACT EXTRACTION PHACO AND INTRAOCULAR LENS PLACEMENT (IOC);  Surgeon: Birder Robson, MD;  Location: [facility] ORS;  Service: Ophthalmology;  Laterality: Left;  Lot# [id number] H [place]: 01:17.9 AP%:27.8 CDE: 21.67     Prior to Admission medications   Medication Sig Start Date End Date Taking? Authorizing Provider  ACCU-CHEK FASTCLIX LANCETS Between  06/18/17   [provider]  ACCU-CHEK SMARTVIEW test strip  06/23/17   [provider]  alendronate (FOSAMAX) 70 MG tablet Take 1 tablet by mouth once a week. Patient not taking: No sig reported 04/29/19   [provider]  amLODipine (NORVASC) 10 MG tablet Take 1 tablet by mouth daily. 02/24/20   [provider]  Calcium Carbonate-Vitamin D 600-400 MG-UNIT tablet Take 1 tablet by mouth 2 (two) times daily. Patient not taking: Reported on 10/18/2020 04/29/19 04/28/20  [provider]  glimepiride (AMARYL) 2 MG tablet TAKE 1/2 TABLET DAILY AS NEEDED IF RANDOM GLUCOSE IS GREATER THAN 200 OR FASTING BLOOD SUGAR IS SUSTAINED GREATER THAN 130 07/21/17   [provider]  lisinopril-hydrochlorothiazide (PRINZIDE,ZESTORETIC) 10-12.5 MG per tablet Take 1 tablet by mouth daily.    [provider]  metFORMIN (GLUCOPHAGE) 1000 MG tablet Take 1,000 mg by mouth 2 (two) times daily with a meal.     [provider]  Multiple Vitamin (MULTIVITAMIN) capsule Take 1 capsule by mouth daily.    [provider]  omeprazole (PRILOSEC) 20 MG capsule Take 20 mg by mouth daily.    [provider]  pramipexole (MIRAPEX) 0.25 MG tablet Take 1 tablet  by mouth 1 day or 1 dose. 03/25/19   [provider]  rosuvastatin (CRESTOR) 5 MG tablet Take by mouth. 06/15/20 06/15/21  [provider]  traZODone (DESYREL) 50 MG tablet TAKE 1 TABLET BY MOUTH NIGHTLY 01/29/18   [provider]  vitamin E 180 MG (400 UNITS) capsule Take 400 Units by mouth daily. Patient not taking: Reported on 10/18/2020    [provider]    Allergies Codeine, Demerol [meperidine], Polysaccharide iron complex, and Naproxen  No family history on file.  Social History Social History   Tobacco Use  . Smoking status: Former Research scientist (life sciences)  . Smokeless tobacco: Never Used  Vaping Use  . Vaping Use: Never used  Substance Use Topics  . Alcohol use: No  . Drug use: No    Review of Systems Constitutional: No fever. Eyes: No visual changes. ENT: No sore throat. Cardiovascular: Denies chest pain. Respiratory: + shortness of breath. Gastrointestinal: No nausea, vomiting, diarrhea. Genitourinary: Negative for dysuria. Musculoskeletal: Negative for back pain. Skin: Negative for rash. Neurological: Negative for focal weakness or numbness.  ____________________________________________   PHYSICAL EXAM:  VITAL SIGNS: ED Triage Vitals  Enc Vitals Group     BP 11/27/20 0414 (!) 158/71     Pulse Rate 11/27/20 0414 91     Resp 11/27/20 0414 (!) 26     Temp --      Temp src --      SpO2 11/27/20 0413 (!) 79 %     Weight 11/27/20 0415 148 lb 5.9 oz (67.3 kg)     Height 11/27/20 0415 '5\' 1"'$  (1.549 m)     Head Circumference --      Peak Flow --      Pain Score --      Pain Loc --      Pain Edu? --      Excl. in [place]? --    CONSTITUTIONAL: Alert and oriented and responds appropriately to questions.  Elderly HEAD: Normocephalic EYES: Conjunctivae clear, pupils appear equal, EOM appear intact ENT: normal nose; moist mucous membranes NECK: Supple, normal ROM CARD: RRR; S1 and S2 appreciated; no murmurs, no clicks, no rubs, no gallops RESP:  Patient is tachypneic.  Speaking full sentences.  Sats 79% on room air.  Diffuse inspiratory and expiratory wheezes.  No significant respiratory distress. ABD/GI: Normal bowel sounds; non-distended; soft, non-tender, no rebound, no guarding, no peritoneal signs, no hepatosplenomegaly BACK: The back appears normal EXT: Normal ROM in all joints; no deformity noted, no edema; no cyanosis, no calf tenderness or calf swelling SKIN: Normal color for age and race; warm; no rash on exposed skin NEURO: Moves all extremities equally PSYCH: The patient's mood and manner are appropriate.  ____________________________________________   LABS (all labs ordered are listed, but only abnormal results are displayed)  Labs Reviewed  CBC WITH DIFFERENTIAL/PLATELET - Abnormal; Notable for the following components:      Result Value   RBC 2.88 (*)    Hemoglobin 8.8 (*)    HCT 27.8 (*)    All other components within normal limits  BASIC  METABOLIC PANEL - Abnormal; Notable for the following components:   Glucose, Bld 186 (*)    Creatinine, Ser 1.40 (*)    GFR, Estimated 36 (*)    All other components within normal limits  BRAIN NATRIURETIC PEPTIDE - Abnormal; Notable for the following components:   B Natriuretic Peptide 288.2 (*)    All other components within normal limits  D-DIMER, QUANTITATIVE - Abnormal; Notable for the following components:   D-Dimer, Quant 1.17 (*)    All other components within normal limits  BLOOD GAS, ARTERIAL - Abnormal; Notable for the following components:   pO2, Arterial 109 (*)    Acid-base deficit 2.6 (*)    All other components within normal limits  RESP PANEL BY RT-PCR (FLU A&B, COVID) ARPGX2  TROPONIN I (HIGH SENSITIVITY)   ____________________________________________  EKG   EKG Interpretation  Date/Time:  Tuesday November 27 2020 04:20:28 EDT Ventricular Rate:  92 PR Interval:  162 QRS Duration: 100 QT Interval:  366 QTC Calculation: 453 R Axis:   76 Text  Interpretation: Sinus rhythm Nonspecific repol abnormality, diffuse leads Confirmed by Pryor Curia [id number]) on 11/27/2020 4:29:55 AM       ____________________________________________  RADIOLOGY Jessie Foot Davinity Fanara, personally viewed and evaluated these images (plain radiographs) as part of my medical decision making, as well as reviewing the written report by the radiologist.  ED MD interpretation:  CXR shows pulmonary edema.    Official radiology report(s): DG Chest Portable 1 View  Result Date: 11/27/2020 CLINICAL DATA:  Shortness of breath. EXAM: PORTABLE CHEST 1 VIEW COMPARISON:  No prior. FINDINGS: Cardiomegaly. Bilateral interstitial prominence. No pleural effusion or pneumothorax. Degenerative change thoracic spine. IMPRESSION: 1. Cardiomegaly. 2. Bilateral interstitial prominence. Findings suggest mild CHF. Pneumonitis cannot be excluded. Electronically Signed   By: Marcello Moores  Register   On: 11/27/2020 05:03    ____________________________________________   PROCEDURES  Procedure(s) performed (including Critical Care):  Procedures  CRITICAL CARE Performed by: Cyril Mourning Dalani Mette   Total critical care time: 55 minutes  Critical care time was exclusive of separately billable procedures and treating other patients.  Critical care was necessary to treat or prevent imminent or life-threatening deterioration.  Critical care was time spent personally by me on the following activities: development of treatment plan with patient and/or surrogate as well as nursing, discussions with consultants, evaluation of patient's response to treatment, examination of patient, obtaining history from patient or surrogate, ordering and performing treatments and interventions, ordering and review of laboratory studies, ordering and review of radiographic studies, pulse oximetry and re-evaluation of patient's condition.  ____________________________________________   INITIAL IMPRESSION / ASSESSMENT AND  PLAN / ED COURSE  As part of my medical decision making, I reviewed the following data within the [facility] notes reviewed and incorporated, Labs reviewed , EKG interpreted , Old EKG reviewed, Old chart reviewed, Radiograph reviewed , Discussed with admitting physician  and Notes from prior ED visits         Patient here with shortness of breath, wheezing.  She was hypoxic and does not wear oxygen chronically.  Differential includes bronchospasm, COPD, CHF, pneumonia, COVID, flu, PE, ACS.  Will obtain labs, chest x-ray, COVID and flu swabs, ABG.  Patient placed on BiPAP for comfort given sats in the 80s on nonrebreather.  ED PROGRESS  Patient's ABG reassuring.  No CO2 retention.  Normal pH.  Chest x-ray shows pulmonary edema.  BNP 288.  Will give IV Lasix.  Troponin normal.  Patient's D-dimer is  elevated.  We will proceed with CTA of the chest to rule out PE given her significant hypoxia but I suspect this is all due to CHF.  She has no known history of CHF.  Will need admission for IV diuresis, echocardiogram and continued oxygen.  She has significantly improved on BiPAP and states she is feeling much better.  5:20 AM  Discussed patient's case with hospitalist, Dr. Sidney Ace.  I have recommended admission and patient (and family if present) agree with this plan. Admitting physician will place admission orders.   I reviewed all nursing notes, vitals, pertinent previous records and reviewed/interpreted all EKGs, lab and urine results, imaging (as available).   ____________________________________________   FINAL CLINICAL IMPRESSION(S) / ED DIAGNOSES  Final diagnoses:  Acute respiratory failure with hypoxia (HCC)  Acute on chronic congestive heart failure, unspecified heart failure type [facility])     ED Discharge Orders    None      *Please note:  Shawniece Gewirtz was evaluated in Emergency Department on 11/27/2020 for the symptoms described in the history of  present illness. She was evaluated in the context of the global COVID-19 pandemic, which necessitated consideration that the patient might be at risk for infection with the SARS-CoV-2 virus that causes COVID-19. Institutional protocols and algorithms that pertain to the evaluation of patients at risk for COVID-19 are in a state of rapid change based on information released by regulatory bodies including the CDC and federal and state organizations. These policies and algorithms were followed during the patient's care in the ED.  Some ED evaluations and interventions may be delayed as a result of limited staffing during and the pandemic.*   Note:  This document was prepared using Dragon voice recognition software and may include unintentional dictation errors.   Raynaldo Falco, Delice Bison, DO 11/27/20 [phone]

## 2020-11-27 NOTE — ED Notes (Signed)
Ultrasound at bedside

## 2020-11-27 NOTE — Progress Notes (Signed)
Patient seen and examined, admitted earlier this morning by Dr. Sidney Ace, please see his H&P for details -Briefly Ms. Byl is a 87 year old Caucasian female with history of type 2 diabetes mellitus, hypertension, GERD, restless leg syndrome who still works as a Programme researcher, broadcasting/film/video at [employer] presented to the ED overnight with acute onset dyspnea that woke her up from sleep. -In the ED she was hypoxic, O2 sats were in the 60s required a nonrebreather mask initially, lab work notable for BNP of 288, creatinine of 1.4, hemoglobin of 8.8, EKG with ST segment depression in lateral leads, chest x-ray suggested CHF -CT chest, notes bilateral pleural effusion and some nonspecific adenopathy  Acute hypoxic respiratory failure Pulmonary edema/acute CHF -Continue IV Lasix today -Follow-up echocardiogram, EKG notes ST depression in lateral leads -Admit to telemetry bed -Ambulate, PT OT tomorrow as tolerated  AKI -Likely cardiorenal, monitor with diuresis, she also got contrast for CTA overnight, monitor urine output and kidney function closely  Essential hypertension -Discontinue amlodipine, lisinopril on hold -Start low-dose beta-blocker in a few days  Mediastinal adenopathy and calcified hilar adenopathy -Unclear etiology -Recommend repeat CT in 3 to 6 months  Type 2 diabetes mellitus -Hold metformin, sliding scale insulin for now  Discussed CODE STATUS with the patient, she tells me she is a DNR, will update order  Domenic Polite, MD

## 2020-11-27 NOTE — Consult Note (Signed)
Cardiology Consultation Note    Patient ID: Lisa Mcneil, MRN: [id number], DOB/AGE: September 24, 1927 87 y.o. Admit date: 11/27/2020   Date of Consult: 11/27/2020 Primary Physician: Dion Body, MD Primary Cardiologist: none  Chief Complaint: sob Reason for Consultation: Lisa Mcneil Requesting MD: Dr. Sidney Ace  HPI: [name] is a 87 y.o. female with history of diabetes, hypertension, restless leg syndrome who was brought to the emergency room when she had an acute episode of dyspnea that woke her from sleep early this morning associated with orthopnea.  She had worsening lower extremity edema and mild dyspnea on exertion recently.  She denies any cough wheezing or chest pain.  In the emergency room her blood gas revealed a pH of 7.36 with a PO2 of 109.  She was had a sat of 90% on BiPAP.  Her creatinine was 1.4 BUN of 22 with a brain natruretic peptide of 288.2 and a high-sensitivity of troponin at 17.  Her D-dimer was 1.17.  She was anemic with a hemoglobin of 8.8.  EKG showed sinus rhythm with nonspecific ST-T wave changes.  Chest x-ray revealed bilateral interstitial prominence consistent with possible mild congestive heart failure.  She was given IV Lasix and a nebulizing treatment with IV steroids.  Chest CT to rule out pulmonary embolus has been ordered and is still pending.  Cardiology consultation was requested due to congestive heart failure.  Echocardiogram is pending.  Per chart she is treated with amlodipine 10 mg daily, lisinopril hydrochlorothiazide 10-12.5 mg daily, oral antihyperglycemic drugs.  She is also on rosuvastatin at 5 mg daily.  She has been continued with her amlodipine and started on IV Lasix lisinopril hydrochlorothiazide being held.  She is hemodynamically stable.  Lower extremity Doppler revealed no DVT.  Past Medical History:  Diagnosis Date  . Diabetes mellitus without complication ([place])   . GERD (gastroesophageal reflux disease)   . Hypertension   .  Insomnia   . Iron deficiency anemia   . RLS (restless legs syndrome)       Surgical History:  Past Surgical History:  Procedure Laterality Date  . BLADDER REPAIR    . CATARACT EXTRACTION W/PHACO Right 05/13/2016   Procedure: CATARACT EXTRACTION PHACO AND INTRAOCULAR LENS PLACEMENT (IOC);  Surgeon: Birder Robson, MD;  Location: [facility] ORS;  Service: Ophthalmology;  Laterality: Right;  [place] 1.09 AP% 21.0 CDE 14.60 Fluid Pack Lot # [id number]  . CATARACT EXTRACTION W/PHACO Left 07/15/2016   Procedure: CATARACT EXTRACTION PHACO AND INTRAOCULAR LENS PLACEMENT (IOC);  Surgeon: Birder Robson, MD;  Location: [facility] ORS;  Service: Ophthalmology;  Laterality: Left;  Lot# [id number] H [place]: 01:17.9 AP%:27.8 CDE: 21.67      Home Meds: Prior to Admission medications   Medication Sig Start Date End Date Taking? Authorizing Provider  amLODipine (NORVASC) 10 MG tablet Take 10 mg by mouth daily.   Yes [provider]  Calcium Carbonate-Vit D-Min (CALTRATE 600+D PLUS MINERALS) 600-800 MG-UNIT CHEW Chew 1 tablet by mouth as directed.   Yes [provider]  ferrous sulfate 325 (65 FE) MG tablet Take 325 mg by mouth daily.   Yes [provider]  gabapentin (NEURONTIN) 100 MG capsule Take 100 mg by mouth at bedtime.   Yes [provider]  lisinopril-hydrochlorothiazide (ZESTORETIC) 20-12.5 MG tablet Take 1 tablet by mouth daily.   Yes [provider]  metFORMIN (GLUCOPHAGE) 500 MG tablet Take 500 mg by mouth 2 (two) times daily with a meal.   Yes [provider]  Multiple Vitamins-Minerals (MULTIVITAMIN  WITH MINERALS) tablet Take 1 tablet by mouth daily.   Yes [provider]  omeprazole (PRILOSEC) 20 MG capsule Take 20 mg by mouth 2 (two) times daily.   Yes [provider]  pramipexole (MIRAPEX) 0.25 MG tablet Take 0.25 mg by mouth in the morning and at bedtime.   Yes [provider]  rosuvastatin (CRESTOR) 5 MG tablet Take 5 mg by  mouth at bedtime.   Yes [provider]  traZODone (DESYREL) 50 MG tablet Take 50 mg by mouth at bedtime.   Yes [provider]    Inpatient Medications:  . amLODipine  10 mg Oral Daily  . enoxaparin (LOVENOX) injection  30 mg Subcutaneous Q24H  . furosemide  40 mg Intravenous Q12H  . glimepiride  2 mg Oral Q breakfast  . insulin aspart  0-9 Units Subcutaneous TID PC & HS  . multivitamin with minerals  1 tablet Oral Daily  . pantoprazole  40 mg Oral Daily  . pramipexole  0.25 mg Oral Daily  . rosuvastatin  5 mg Oral Daily  . traZODone  50 mg Oral QHS  . vitamin E  400 Units Oral Daily     Allergies:  Allergies  Allergen Reactions  . Codeine   . Demerol [Meperidine]   . Polysaccharide Iron Complex Other (See Comments)    Caused constipation  . Naproxen Rash    Caused rash on mouth    Social History   Socioeconomic History  . Marital status: Single    Spouse name: Not on file  . Number of children: Not on file  . Years of education: Not on file  . Highest education level: Not on file  Occupational History  . Not on file  Tobacco Use  . Smoking status: Former Research scientist (life sciences)  . Smokeless tobacco: Never Used  Vaping Use  . Vaping Use: Never used  Substance and Sexual Activity  . Alcohol use: No  . Drug use: No  . Sexual activity: Not on file  Other Topics Concern  . Not on file  Social History Narrative  . Not on file   Social Determinants of Health   Financial Resource Strain: Not on file  Food Insecurity: Not on file  Transportation Needs: Not on file  Physical Activity: Not on file  Stress: Not on file  Social Connections: Not on file  Intimate Partner Violence: Not on file     No family history on file.   Review of Systems: A 12-system review of systems was performed and is negative except as noted in the HPI.  Labs: No results for input(s): CKTOTAL, CKMB, TROPONINI in the last 72 hours. Lab Results  Component Value Date   WBC 7.5  11/27/2020   HGB 8.8 (L) 11/27/2020   HCT 27.8 (L) 11/27/2020   MCV 96.5 11/27/2020   PLT 226 11/27/2020    Recent Labs  Lab 11/27/20 0419  NA 139  K 3.9  CL 106  CO2 24  BUN 22  CREATININE 1.40*  CALCIUM 9.4  GLUCOSE 186*   No results found for: CHOL, HDL, LDLCALC, TRIG Lab Results  Component Value Date   DDIMER 1.17 (H) 11/27/2020    Radiology/Studies:  US Venous Img Lower Unilateral Left (DVT)  Result Date: 11/27/2020 CLINICAL DATA:  Elevated D-dimer. EXAM: LEFT LOWER EXTREMITY VENOUS DOPPLER ULTRASOUND TECHNIQUE: Gray-scale sonography with compression, as well as color and duplex ultrasound, were performed to evaluate the deep venous system(s) from the level of the common femoral  vein through the popliteal and proximal calf veins. COMPARISON:  None. FINDINGS: VENOUS Normal compressibility of the common femoral, superficial femoral, and popliteal veins, as well as the visualized calf veins. Visualized portions of profunda femoral vein and great saphenous vein unremarkable. No filling defects to suggest DVT on grayscale or color Doppler imaging. Doppler waveforms show normal direction of venous flow, normal respiratory plasticity and response to augmentation. Limited views of the contralateral common femoral vein are unremarkable. OTHER None. IMPRESSION: Negative exam.  No evidence of DVT. Electronically Signed   By: Marcello Moores  Register   On: 11/27/2020 07:25   US Venous Img Lower Unilateral Right  Result Date: 11/27/2020 CLINICAL DATA:  Right lower extremity swelling. EXAM: RIGHT LOWER EXTREMITY VENOUS DOPPLER ULTRASOUND TECHNIQUE: Gray-scale sonography with compression, as well as color and duplex ultrasound, were performed to evaluate the deep venous system(s) from the level of the common femoral vein through the popliteal and proximal calf veins. COMPARISON:  No prior. FINDINGS: VENOUS Normal compressibility of the common femoral, superficial femoral, and popliteal veins, as well as  the visualized calf veins. Visualized portions of profunda femoral vein and great saphenous vein unremarkable. No filling defects to suggest DVT on grayscale or color Doppler imaging. Doppler waveforms show normal direction of venous flow, normal respiratory plasticity and response to augmentation. Limited views of the contralateral common femoral vein are unremarkable. OTHER None. IMPRESSION: Negative exam.  No evidence of DVT. Electronically Signed   By: Marcello Moores  Register   On: 11/27/2020 05:26   DG Chest Portable 1 View  Result Date: 11/27/2020 CLINICAL DATA:  Shortness of breath. EXAM: PORTABLE CHEST 1 VIEW COMPARISON:  No prior. FINDINGS: Cardiomegaly. Bilateral interstitial prominence. No pleural effusion or pneumothorax. Degenerative change thoracic spine. IMPRESSION: 1. Cardiomegaly. 2. Bilateral interstitial prominence. Findings suggest mild CHF. Pneumonitis cannot be excluded. Electronically Signed   By: Marcello Moores  Register   On: 11/27/2020 05:03    Wt Readings from Last 3 Encounters:  11/27/20 67.3 kg  10/18/20 64 kg  07/19/20 64.3 kg    EKG: Sinus rhythm with nonspecific ST-T wave changes  Physical Exam:  Blood pressure (!) 154/58, pulse 77, temperature (!) 97.2 F (36.2 C), temperature source Rectal, resp. rate 12, height '5\' 1"'$  (1.549 m), weight 67.3 kg, SpO2 92 %. Body mass index is 28.03 kg/m. General: Well developed, well nourished, in no acute distress. Head: Normocephalic, atraumatic, sclera non-icteric, no xanthomas, nares are without discharge.  Neck: Negative for carotid bruits. JVD not elevated. Lungs: Clear bilaterally to auscultation without wheezes, rales, or rhonchi. Breathing is unlabored. Heart: RRR with S1 S2. 2/6 systolic murmur Abdomen: Soft, non-tender, non-distended with normoactive bowel sounds. No hepatomegaly. No rebound/guarding. No obvious abdominal masses. Msk:  Strength and tone appear normal for age. Extremities: No clubbing or cyanosis.  1+ pitting  edema bilaterally. Neuro: Alert and oriented X 3. No facial asymmetry. No focal deficit. Moves all extremities spontaneously. Psych:  Responds to questions appropriately with a normal affect.     Assessment and Plan   87 y.o. female with history of diabetes, hypertension, restless leg syndrome who was brought to the emergency room when she had an acute episode of dyspnea that woke her from sleep early this morning associated with orthopnea.  She had worsening lower extremity edema and mild dyspnea on exertion recently.  She denies any cough wheezing or chest pain.  In the emergency room her blood gas revealed a pH of 7.36 with a PO2 of 109.  She was had  a sat of 90% on BiPAP.  Her creatinine was 1.4 BUN of 22 with a brain natruretic peptide of 288.2 and a high-sensitivity of troponin at 17.  Her D-dimer was 1.17.  She was anemic with a hemoglobin of 8.8.  EKG showed sinus rhythm with nonspecific ST-T wave changes.  Chest x-ray revealed bilateral interstitial prominence consistent with possible mild congestive heart failure.  She was given IV Lasix and a nebulizing treatment with IV steroids.  Chest CT to rule out pulmonary embolus has been ordered and is still pending.  Cardiology consultation was requested due to congestive heart failure.  Per chart she is treated with amlodipine 10 mg daily, lisinopril hydrochlorothiazide 10-12.5 mg daily, oral antihyperglycemic drugs.  She is also on rosuvastatin at 5 mg daily.  She has been continued with her amlodipine and started on IV Lasix lisinopril hydrochlorothiazide being held.  She is hemodynamically stable.  Lower extremity Doppler revealed no DVT.  1.  Heart failure-has become volume overload on chest x-ray.  Echo showed preserved LV function with at least moderate aortic stenosis.  Etiology of heart failure may be multifactorial including diastolic as well as her aortic valvular disease.  Aortic valve disease does not appear to be critical.  This will need  to be evaluated further after coming euvolemic.  Would likely do this as an outpatient.  We will continue careful diuresis following renal function and hemodynamics as well as electrolytes.  2.  Diabetes mellitus-continue with current regimen  3.  Hypertension-continue to follow hemodynamics while diuresing.  Avoid afterload reduction due to aortic stenosis.      Signed, Teodoro Spray MD 11/27/2020, 7:44 AM Pager: [phone]

## 2020-11-27 NOTE — ED Notes (Signed)
bg 199

## 2020-11-27 NOTE — ED Notes (Signed)
Per Dr. Sidney Ace patient can be weaned off of bipap and placed on [place] at this time. RT notified. Instructed to take patient off of BIPAP and place her on 4L of O2 via nasal cannula. NAD. Respirations even and unlabored.

## 2020-11-27 NOTE — ED Notes (Signed)
Dr. Leonides Schanz and RT at bedside

## 2020-11-27 NOTE — H&P (Addendum)
[place]   PATIENT NAME: Lisa Mcneil    MR#:  [id number]  DATE OF BIRTH:  07/02/1928  DATE OF ADMISSION:  11/27/2020  PRIMARY CARE PHYSICIAN: Dion Body, MD   Patient is coming from: Home  REQUESTING/REFERRING PHYSICIAN: Ward, Delice Bison, DO  CHIEF COMPLAINT:   Chief Complaint  Patient presents with  . Shortness of Breath    HISTORY OF PRESENT ILLNESS:  Lisa Mcneil is a 87 y.o. Caucasian female with medical history significant for type 2 diabetes mellitus, hypertension, GERD and restless leg syndrome, who presented to the emergency room with acute onset of dyspnea that woke her up from sleep at 2 AM likely with associated mild orthopnea.  She has been having worsening lower extremity edema and mild dyspnea on exertion with her symptoms.  She denies any dysuria, oliguria or hematuria or flank pain.  No fever or chills.  No cough or wheezing.  No headache or dizziness or blurred vision.  ED Course: Labs revealed an ABG with pH 7.36, PCO2 40 and PO2 109 with HCO3 of 22.6 and O2 sat of 90% on BiPAP.  BMP was remarkable for a BUN of 22 and creatinine 1.4 and glucose 186.  BNP was 288.2 and high-sensitivity troponin I 17.  CBC showed anemia with hemoglobin of 8.8 and hematocrit 27.8 compared to 9.1 and 27.8 on 10/17/2020.  D-dimer her came back 1.17.  Respiratory panel is currently pending. EKG as reviewed by me :EKG showed normal sinus rhythm with a rate of 92 with slightly depressed ST segment anterolaterally Imaging: Portable chest ray showed cardiomegaly and bilateral interstitial prominence suggesting mild acute CHF.  The patient was given nebulizer albuterol, 40 mg IV Lasix and nebulized Atrovent as well as 125 mg of IV Solu-Medrol.  She will be admitted to a progressive unit bed for further evaluation and management. PAST MEDICAL HISTORY:   Past Medical History:  Diagnosis Date  . Diabetes mellitus without complication ([place])   . GERD (gastroesophageal  reflux disease)   . Hypertension   . Insomnia   . Iron deficiency anemia   . RLS (restless legs syndrome)     PAST SURGICAL HISTORY:   Past Surgical History:  Procedure Laterality Date  . BLADDER REPAIR    . CATARACT EXTRACTION W/PHACO Right 05/13/2016   Procedure: CATARACT EXTRACTION PHACO AND INTRAOCULAR LENS PLACEMENT (IOC);  Surgeon: Birder Robson, MD;  Location: [facility] ORS;  Service: Ophthalmology;  Laterality: Right;  [place] 1.09 AP% 21.0 CDE 14.60 Fluid Pack Lot # [id number]  . CATARACT EXTRACTION W/PHACO Left 07/15/2016   Procedure: CATARACT EXTRACTION PHACO AND INTRAOCULAR LENS PLACEMENT (IOC);  Surgeon: Birder Robson, MD;  Location: [facility] ORS;  Service: Ophthalmology;  Laterality: Left;  Lot# [id number] H [place]: 01:17.9 AP%:27.8 CDE: 21.67     SOCIAL HISTORY:   Social History   Tobacco Use  . Smoking status: Former Research scientist (life sciences)  . Smokeless tobacco: Never Used  Substance Use Topics  . Alcohol use: No    FAMILY HISTORY:  No family history on file.  DRUG ALLERGIES:   Allergies  Allergen Reactions  . Codeine   . Demerol [Meperidine]   . Polysaccharide Iron Complex Other (See Comments)    Caused constipation  . Naproxen Rash    Caused rash on mouth    REVIEW OF SYSTEMS:   ROS As per history of present illness. All pertinent systems were reviewed above. Constitutional, HEENT, cardiovascular, respiratory, GI, GU, musculoskeletal, neuro, psychiatric, endocrine, integumentary and hematologic systems were  reviewed and are otherwise negative/unremarkable except for positive findings mentioned above in the HPI.   MEDICATIONS AT HOME:   Prior to Admission medications   Medication Sig Start Date End Date Taking? Authorizing Provider  alendronate (FOSAMAX) 70 MG tablet Take 1 tablet by mouth once a week. Patient not taking: No sig reported 04/29/19   [provider]  amLODipine (NORVASC) 10 MG tablet Take 1 tablet by mouth daily. 02/24/20   [provider]   Calcium Carbonate-Vitamin D 600-400 MG-UNIT tablet Take 1 tablet by mouth 2 (two) times daily. Patient not taking: Reported on 10/18/2020 04/29/19 04/28/20  [provider]  glimepiride (AMARYL) 2 MG tablet TAKE 1/2 TABLET DAILY AS NEEDED IF RANDOM GLUCOSE IS GREATER THAN 200 OR FASTING BLOOD SUGAR IS SUSTAINED GREATER THAN 130 07/21/17   [provider]  lisinopril-hydrochlorothiazide (PRINZIDE,ZESTORETIC) 10-12.5 MG per tablet Take 1 tablet by mouth daily.    [provider]  metFORMIN (GLUCOPHAGE) 1000 MG tablet Take 1,000 mg by mouth 2 (two) times daily with a meal.     [provider]  Multiple Vitamin (MULTIVITAMIN) capsule Take 1 capsule by mouth daily.    [provider]  omeprazole (PRILOSEC) 20 MG capsule Take 20 mg by mouth daily.    [provider]  pramipexole (MIRAPEX) 0.25 MG tablet Take 1 tablet by mouth 1 day or 1 dose. 03/25/19   [provider]  rosuvastatin (CRESTOR) 5 MG tablet Take by mouth. 06/15/20 06/15/21  [provider]  traZODone (DESYREL) 50 MG tablet TAKE 1 TABLET BY MOUTH NIGHTLY 01/29/18   [provider]  vitamin E 180 MG (400 UNITS) capsule Take 400 Units by mouth daily. Patient not taking: Reported on 10/18/2020    [provider]      VITAL SIGNS:  Blood pressure 131/73, pulse 85, temperature (!) 97.2 F (36.2 C), temperature source Rectal, resp. rate (!) 28, height '5\' 1"'$  (1.549 m), weight 67.3 kg, SpO2 100 %.  PHYSICAL EXAMINATION:  Physical Exam  GENERAL:  87 y.o.-year-old patient semisitting in the bed with mild respiratory distress on BiPAP. EYES: Pupils equal, round, reactive to light and accommodation. No scleral icterus. Extraocular muscles intact.  HEENT: Head atraumatic, normocephalic. Oropharynx and nasopharynx clear.  NECK:  Supple, no jugular venous distention. No thyroid enlargement, no tenderness.  LUNGS: Diminished bibasal breath sounds with bibasal  rales. CARDIOVASCULAR: Regular rate and rhythm, S1, S2 normal. No murmurs, rubs, or gallops.  ABDOMEN: Soft, nondistended, nontender. Bowel sounds present. No organomegaly or mass.  EXTREMITIES: 1+ bilateral lower extremity pitting edema with no clubbing or cyanosis.   NEUROLOGIC: Cranial nerves II through XII are intact. Muscle strength 5/5 in all extremities. Sensation intact. Gait not checked.  PSYCHIATRIC: The patient is alert and oriented x 3.  Normal affect and good eye contact. SKIN: No obvious rash, lesion, or ulcer.   LABORATORY PANEL:   CBC Recent Labs  Lab 11/27/20 0419  WBC 7.5  HGB 8.8*  HCT 27.8*  PLT 226   ------------------------------------------------------------------------------------------------------------------  Chemistries  Recent Labs  Lab 11/27/20 0419  NA 139  K 3.9  CL 106  CO2 24  GLUCOSE 186*  BUN 22  CREATININE 1.40*  CALCIUM 9.4   ------------------------------------------------------------------------------------------------------------------  Cardiac Enzymes No results for input(s): TROPONINI in the last 168 hours. ------------------------------------------------------------------------------------------------------------------  RADIOLOGY:  US Venous Img Lower Unilateral Right  Result Date: 11/27/2020 CLINICAL DATA:  Right lower extremity swelling. EXAM: RIGHT LOWER EXTREMITY VENOUS DOPPLER ULTRASOUND TECHNIQUE: Gray-scale sonography  with compression, as well as color and duplex ultrasound, were performed to evaluate the deep venous system(s) from the level of the common femoral vein through the popliteal and proximal calf veins. COMPARISON:  No prior. FINDINGS: VENOUS Normal compressibility of the common femoral, superficial femoral, and popliteal veins, as well as the visualized calf veins. Visualized portions of profunda femoral vein and great saphenous vein unremarkable. No filling defects to suggest DVT on grayscale or color Doppler  imaging. Doppler waveforms show normal direction of venous flow, normal respiratory plasticity and response to augmentation. Limited views of the contralateral common femoral vein are unremarkable. OTHER None. IMPRESSION: Negative exam.  No evidence of DVT. Electronically Signed   By: Marcello Moores  Register   On: 11/27/2020 05:26   DG Chest Portable 1 View  Result Date: 11/27/2020 CLINICAL DATA:  Shortness of breath. EXAM: PORTABLE CHEST 1 VIEW COMPARISON:  No prior. FINDINGS: Cardiomegaly. Bilateral interstitial prominence. No pleural effusion or pneumothorax. Degenerative change thoracic spine. IMPRESSION: 1. Cardiomegaly. 2. Bilateral interstitial prominence. Findings suggest mild CHF. Pneumonitis cannot be excluded. Electronically Signed   By: Marcello Moores  Register   On: 11/27/2020 05:03      IMPRESSION AND PLAN:  Active Problems:   Acute CHF (congestive heart failure) ([place])  1.  Acute new onset CHF, likely diastolic. - The patient was admitted to a progressive unit bed. - We will follow serial troponin I's. - We will diurese her with IV Lasix. - We will obtain a 2D echo and a cardiology consult in a.m. - I notified Dr. Ubaldo Glassing about the patient.  2.  Acute kidney injury superimposed on stage III chronic kidney disease likely prerenal secondary to acute CHF. - Will monitor BMP with diuresis. - We will hold off nephrotoxins.  3.  Elevated D-dimer. - Chest CTA is currently pending. - We will obtain bilateral lower extremity venous Doppler as well.  4.  Essential hypertension. - We will continue amlodipine and hold lisinopril HCT.  5.  Dyslipidemia. - We will continue statin therapy.  6.  Type 2 diabetes mellitus with peripheral neuropathy. - We will place the patient on supplement coverage with NovoLog. - We will hold off metformin. - We will continue Neurontin.  7.  Restless leg syndrome. - We will continue Requip.  8.  GERD. - We will continue PPI therapy.  DVT prophylaxis:  Lovenox. Code Status: full code. Family Communication:  The plan of care was discussed in details with the patient (and her son who was with her in the room.). I answered all questions. The patient agreed to proceed with the above mentioned plan. Further management will depend upon hospital course. Disposition Plan: Back to previous home environment Consults called: Cardiology.  All the records are reviewed and case discussed with ED provider.  Status is: Inpatient  Remains inpatient appropriate because:Ongoing diagnostic testing needed not appropriate for outpatient work up, Unsafe d/c plan, IV treatments appropriate due to intensity of illness or inability to take PO and Inpatient level of care appropriate due to severity of illness   Dispo: The patient is from: Home              Anticipated d/c is to: Home              Patient currently is not medically stable to d/c.   Difficult to place patient No   TOTAL TIME TAKING CARE OF THIS PATIENT: 55 minutes.    Christel Mormon M.D on 11/27/2020 at 5:30 AM  [facility]   From 7 PM-7 AM, contact night-coverage [email or web address]  CC: Primary care physician; Dion Body, MD

## 2020-11-27 NOTE — Progress Notes (Signed)
*  PRELIMINARY RESULTS* Echocardiogram 2D Echocardiogram has been performed.  Lisa Mcneil 11/27/2020, 9:40 AM

## 2020-11-28 DIAGNOSIS — I5031 Acute diastolic (congestive) heart failure: Secondary | ICD-10-CM

## 2020-11-28 DIAGNOSIS — J9601 Acute respiratory failure with hypoxia: Secondary | ICD-10-CM

## 2020-11-28 DIAGNOSIS — I350 Nonrheumatic aortic (valve) stenosis: Secondary | ICD-10-CM

## 2020-11-28 LAB — BASIC METABOLIC PANEL
Anion gap: 10 (ref 5–15)
BUN: 27 mg/dL — ABNORMAL HIGH (ref 8–23)
CO2: 27 mmol/L (ref 22–32)
Calcium: 9.6 mg/dL (ref 8.9–10.3)
Chloride: 100 mmol/L (ref 98–111)
Creatinine, Ser: 1.43 mg/dL — ABNORMAL HIGH (ref 0.44–1.00)
GFR, Estimated: 35 mL/min — ABNORMAL LOW (ref >60)
Glucose, Bld: 161 mg/dL — ABNORMAL HIGH (ref 70–99)
Potassium: 3.8 mmol/L (ref 3.5–5.1)
Sodium: 137 mmol/L (ref 135–145)

## 2020-11-28 LAB — CBC
HCT: 27.4 % — ABNORMAL LOW (ref 36.0–46.0)
Hemoglobin: 8.9 g/dL — ABNORMAL LOW (ref 12.0–15.0)
MCH: 30.2 pg (ref 26.0–34.0)
MCHC: 32.5 g/dL (ref 30.0–36.0)
MCV: 92.9 fL (ref 80.0–100.0)
Platelets: 244 10*3/uL (ref 150–400)
RBC: 2.95 MIL/uL — ABNORMAL LOW (ref 3.87–5.11)
RDW: 13 % (ref 11.5–15.5)
WBC: 7.6 10*3/uL (ref 4.0–10.5)
nRBC: 0 % (ref 0.0–0.2)

## 2020-11-28 LAB — GLUCOSE, CAPILLARY
Glucose-Capillary: 159 mg/dL — ABNORMAL HIGH (ref 70–99)
Glucose-Capillary: 167 mg/dL — ABNORMAL HIGH (ref 70–99)
Glucose-Capillary: 141 mg/dL — ABNORMAL HIGH (ref 70–99)
Glucose-Capillary: 175 mg/dL — ABNORMAL HIGH (ref 70–99)

## 2020-11-28 MED ORDER — MUSCLE RUB 10-15 % EX CREA
TOPICAL_CREAM | CUTANEOUS | Status: DC | PRN
  Filled 2020-11-28: qty 85

## 2020-11-28 NOTE — Progress Notes (Signed)
Patient Name: Lisa Mcneil Date of Encounter: 11/28/2020  Hospital Problem List     Active Problems:   Acute CHF (congestive heart failure) [facility])    Patient Profile     87 y.o. female with history of diabetes, hypertension, restless leg syndrome who was brought to the emergency room when she had an acute episode of dyspnea that woke her from sleep early this morning associated with orthopnea.  She had worsening lower extremity edema and mild dyspnea on exertion recently.  She denies any cough wheezing or chest pain.  In the emergency room her blood gas revealed a pH of 7.36 with a PO2 of 109.  She was had a sat of 90% on BiPAP.  Her creatinine was 1.4 BUN of 22 with a brain natruretic peptide of 288.2 and a high-sensitivity of troponin at 17.  Her D-dimer was 1.17.  She was anemic with a hemoglobin of 8.8.  EKG showed sinus rhythm with nonspecific ST-T wave changes.  Chest x-ray revealed bilateral interstitial prominence consistent with possible mild congestive heart failure.  She was given IV Lasix and a nebulizing treatment with IV steroids.  Chest CT to rule out pulmonary embolus has been ordered and is still pending.  Cardiology consultation was requested due to congestive heart failure.  Echocardiogram showed normal LV function with moderate aortic stenosis.  Per chart she is treated with amlodipine 10 mg daily, lisinopril hydrochlorothiazide 10-12.5 mg daily, oral antihyperglycemic drugs.  She is also on rosuvastatin at 5 mg daily.  She has been continued with her amlodipine and started on IV Lasix lisinopril hydrochlorothiazide being held.  She is hemodynamically stable.  Lower extremity Doppler revealed no DVT.  Subjective   Feels better anxious to go home.  Was hypoxic off of oxygen.  Inpatient Medications    . enoxaparin (LOVENOX) injection  30 mg Subcutaneous Q24H  . furosemide  20 mg Intravenous Q12H  . insulin aspart  0-9 Units Subcutaneous TID PC & HS  . multivitamin with  minerals  1 tablet Oral Daily  . pantoprazole  40 mg Oral Daily  . potassium chloride  40 mEq Oral Daily  . pramipexole  0.25 mg Oral Daily  . rosuvastatin  5 mg Oral Daily  . traZODone  50 mg Oral QHS    Vital Signs    Vitals:   11/27/20 2035 11/28/20 0431 11/28/20 0811 11/28/20 1112  BP: (!) 148/58 (!) 142/53 (!) 133/52 (!) 112/46  Pulse: 82 72 78 72  Resp: '20 18 18 18  '$ Temp: 97.9 F (36.6 C) 97.7 F (36.5 C) 97.6 F (36.4 C) 97.6 F (36.4 C)  TempSrc: Oral Oral Oral   SpO2: 97% 99% 99% 99%  Weight:  61.8 kg    Height:        Intake/Output Summary (Last 24 hours) at 11/28/2020 1306 Last data filed at 11/28/2020 1122 Gross per 24 hour  Intake 240 ml  Output 350 ml  Net -110 ml   Filed Weights   11/27/20 0415 11/28/20 0431  Weight: 67.3 kg 61.8 kg    Physical Exam    GEN: Well nourished, well developed, in no acute distress.  HEENT: normal.  Neck: Supple, no JVD, carotid bruits, or masses. Cardiac: 2/6 systolic murmur radiating to the outflow tract. Respiratory:  Respirations regular and unlabored, clear to auscultation bilaterally. GI: Soft, nontender, nondistended, BS + x 4. MS: no deformity or atrophy. Skin: warm and dry, no rash. Neuro:  Strength and sensation are intact. Psych:  Normal affect.  Labs    CBC Recent Labs    11/27/20 0419 11/28/20 0515  WBC 7.5 7.6  NEUTROABS 5.3  --   HGB 8.8* 8.9*  HCT 27.8* 27.4*  MCV 96.5 92.9  PLT 226 [id number]   Basic Metabolic Panel Recent Labs    11/27/20 0419 11/28/20 0515  NA 139 137  K 3.9 3.8  CL 106 100  CO2 24 27  GLUCOSE 186* 161*  BUN 22 27*  CREATININE 1.40* 1.43*  CALCIUM 9.4 9.6   Liver Function Tests No results for input(s): AST, ALT, ALKPHOS, BILITOT, PROT, ALBUMIN in the last 72 hours. No results for input(s): LIPASE, AMYLASE in the last 72 hours. Cardiac Enzymes No results for input(s): CKTOTAL, CKMB, CKMBINDEX, TROPONINI in the last 72 hours. BNP Recent Labs    11/27/20 0419  BNP  288.2*   D-Dimer Recent Labs    11/27/20 0419  DDIMER 1.17*   Hemoglobin A1C Recent Labs    11/27/20 0628  HGBA1C 5.6   Fasting Lipid Panel No results for input(s): CHOL, HDL, LDLCALC, TRIG, CHOLHDL, LDLDIRECT in the last 72 hours. Thyroid Function Tests No results for input(s): TSH, T4TOTAL, T3FREE, THYROIDAB in the last 72 hours.  Invalid input(s): FREET3  Telemetry    Normal sinus rhythm  ECG    Normal sinus rhythm with no ischemia  Radiology    CT Angio Chest PE W and/or Wo Contrast  Result Date: 11/27/2020 CLINICAL DATA:  Shortness of breath. EXAM: CT ANGIOGRAPHY CHEST WITH CONTRAST TECHNIQUE: Multidetector CT imaging of the chest was performed using the standard protocol during bolus administration of intravenous contrast. Multiplanar CT image reconstructions and MIPs were obtained to evaluate the vascular anatomy. CONTRAST:  42m OMNIPAQUE IOHEXOL 350 MG/ML SOLN COMPARISON:  None. FINDINGS: Cardiovascular: Satisfactory opacification of the pulmonary arteries to the segmental level. No evidence of pulmonary embolism. Mild cardiomegaly is noted. Coronary artery calcifications are noted. Atherosclerosis of thoracic aorta is noted without aneurysm or dissection. No pericardial effusion. Mediastinum/Nodes: Thyroid gland and esophagus are unremarkable. Calcified subcarinal and left hilar adenopathy is noted consistent with prior granulomatous disease. However, noncalcified adenopathy is noted in the precarinal area measuring 14 mm and in the AP window measuring 13 mm. Lungs/Pleura: Small bilateral pleural effusions are noted with adjacent subsegmental atelectasis of the lower lobes. No pneumothorax is noted. Interstitial densities are noted throughout both lungs concerning for pulmonary edema. Upper Abdomen: Calcified splenic granulomata are noted. Musculoskeletal: No chest wall abnormality. No acute or significant osseous findings. Review of the MIP images confirms the above  findings. IMPRESSION: 1. No definite evidence of pulmonary embolus. 2. Coronary artery calcifications are noted suggesting coronary artery disease. 3. Small bilateral pleural effusions are noted with adjacent subsegmental atelectasis of the lower lobes. 4. Interstitial densities are noted throughout both lungs concerning for pulmonary edema. 5. Enlarged noncalcified mediastinal adenopathy is noted which may be inflammatory or neoplastic in etiology. 6. Calcified subcarinal left hilar adenopathy is noted most likely due to prior granulomatous disease. Aortic Atherosclerosis (ICD10-I70.0). Electronically Signed   By: JMarijo ConceptionM.D.   On: 11/27/2020 08:09   [place]Venous Img Lower Unilateral Left (DVT)  Result Date: 11/27/2020 CLINICAL DATA:  Elevated D-dimer. EXAM: LEFT LOWER EXTREMITY VENOUS DOPPLER ULTRASOUND TECHNIQUE: Gray-scale sonography with compression, as well as color and duplex ultrasound, were performed to evaluate the deep venous system(s) from the level of the common femoral vein through the popliteal and proximal calf veins. COMPARISON:  None. FINDINGS: VENOUS  Normal compressibility of the common femoral, superficial femoral, and popliteal veins, as well as the visualized calf veins. Visualized portions of profunda femoral vein and great saphenous vein unremarkable. No filling defects to suggest DVT on grayscale or color Doppler imaging. Doppler waveforms show normal direction of venous flow, normal respiratory plasticity and response to augmentation. Limited views of the contralateral common femoral vein are unremarkable. OTHER None. IMPRESSION: Negative exam.  No evidence of DVT. Electronically Signed   By: Marcello Moores  Register   On: 11/27/2020 07:25   US Venous Img Lower Unilateral Right  Result Date: 11/27/2020 CLINICAL DATA:  Right lower extremity swelling. EXAM: RIGHT LOWER EXTREMITY VENOUS DOPPLER ULTRASOUND TECHNIQUE: Gray-scale sonography with compression, as well as color and duplex  ultrasound, were performed to evaluate the deep venous system(s) from the level of the common femoral vein through the popliteal and proximal calf veins. COMPARISON:  No prior. FINDINGS: VENOUS Normal compressibility of the common femoral, superficial femoral, and popliteal veins, as well as the visualized calf veins. Visualized portions of profunda femoral vein and great saphenous vein unremarkable. No filling defects to suggest DVT on grayscale or color Doppler imaging. Doppler waveforms show normal direction of venous flow, normal respiratory plasticity and response to augmentation. Limited views of the contralateral common femoral vein are unremarkable. OTHER None. IMPRESSION: Negative exam.  No evidence of DVT. Electronically Signed   By: Marcello Moores  Register   On: 11/27/2020 05:26   DG Chest Portable 1 View  Result Date: 11/27/2020 CLINICAL DATA:  Shortness of breath. EXAM: PORTABLE CHEST 1 VIEW COMPARISON:  No prior. FINDINGS: Cardiomegaly. Bilateral interstitial prominence. No pleural effusion or pneumothorax. Degenerative change thoracic spine. IMPRESSION: 1. Cardiomegaly. 2. Bilateral interstitial prominence. Findings suggest mild CHF. Pneumonitis cannot be excluded. Electronically Signed   By: Marcello Moores  Register   On: 11/27/2020 05:03   ECHOCARDIOGRAM COMPLETE  Result Date: 11/27/2020    ECHOCARDIOGRAM REPORT   Patient Name:   TRINADEE RAYBON Date of Exam: 11/27/2020 Medical Rec #:  [id number]          Height:       61.0 in Accession #:    [id number]         Weight:       148.4 lb Date of Birth:  1927/10/13         BSA:          1.664 m Patient Age:    87 years           BP:           168/81 mmHg Patient Gender: F                  HR:           87 bpm. Exam Location:  [facility] Procedure: 2D Echo, Color Doppler and Cardiac Doppler Indications:     I50.31 CHF-Acute Diastolic  History:         Patient has no prior history of Echocardiogram examinations.                  Risk Factors:Hypertension and  Diabetes.  Sonographer:     Charmayne Sheer RDCS (AE) Referring Phys:  [id number] Arvella Merles MANSY Diagnosing Phys: Bartholome Bill MD IMPRESSIONS  1. Left ventricular ejection fraction, by estimation, is 60 to 65%. The left ventricle has normal function. The left ventricle has no regional wall motion abnormalities. There is mild left ventricular hypertrophy. Left ventricular diastolic parameters are consistent with Grade I diastolic dysfunction (  impaired relaxation).  2. Right ventricular systolic function is normal. The right ventricular size is mildly enlarged.  3. The mitral valve is grossly normal. Trivial mitral valve regurgitation.  4. The aortic valve is calcified. Aortic valve regurgitation is trivial. Moderate aortic valve stenosis. FINDINGS  Left Ventricle: Left ventricular ejection fraction, by estimation, is 60 to 65%. The left ventricle has normal function. The left ventricle has no regional wall motion abnormalities. The left ventricular internal cavity size was normal in size. There is  mild left ventricular hypertrophy. Left ventricular diastolic parameters are consistent with Grade I diastolic dysfunction (impaired relaxation). Right Ventricle: The right ventricular size is mildly enlarged. No increase in right ventricular wall thickness. Right ventricular systolic function is normal. Left Atrium: Left atrial size was normal in size. Right Atrium: Right atrial size was normal in size. Pericardium: There is no evidence of pericardial effusion. Mitral Valve: The mitral valve is grossly normal. Trivial mitral valve regurgitation. MV peak gradient, 8.4 mmHg. The mean mitral valve gradient is 5.0 mmHg. Tricuspid Valve: The tricuspid valve is not well visualized. Tricuspid valve regurgitation is trivial. Aortic Valve: The aortic valve is calcified. Aortic valve regurgitation is trivial. Moderate aortic stenosis is present. Aortic valve mean gradient measures 32.0 mmHg. Aortic valve peak gradient measures 62.1 mmHg.  Aortic valve area, by VTI measures 1.05  cm. Pulmonic Valve: The pulmonic valve was not well visualized. Pulmonic valve regurgitation is trivial. Aorta: The aortic root is normal in size and structure. IAS/Shunts: The interatrial septum was not well visualized.  LEFT VENTRICLE PLAX 2D LVIDd:         4.70 cm  Diastology LVIDs:         2.50 cm  LV e' medial:    6.96 cm/s LV PW:         1.00 cm  LV E/e' medial:  19.8 LV IVS:        0.70 cm  LV e' lateral:   7.40 cm/s LVOT diam:     2.00 cm  LV E/e' lateral: 18.6 LV SV:         83 LV SV Index:   50 LVOT Area:     3.14 cm  RIGHT VENTRICLE RV Basal diam:  3.20 cm LEFT ATRIUM             Index       RIGHT ATRIUM           Index LA diam:        3.90 cm 2.34 cm/m  RA Area:     14.10 cm LA Vol (A2C):   55.4 ml 33.30 ml/m RA Volume:   33.80 ml  20.32 ml/m LA Vol (A4C):   87.4 ml 52.54 ml/m LA Biplane Vol: 70.2 ml 42.20 ml/m  AORTIC VALVE                    PULMONIC VALVE AV Area (Vmax):    1.01 cm     PV Vmax:       1.08 m/s AV Area (Vmean):   1.08 cm     PV Vmean:      66.200 cm/s AV Area (VTI):     1.05 cm     PV VTI:        0.222 m AV Vmax:           394.00 cm/s  PV Peak grad:  4.7 mmHg AV Vmean:          258.000 cm/s  PV Mean grad:  2.0 mmHg AV VTI:            0.793 m AV Peak Grad:      62.1 mmHg AV Mean Grad:      32.0 mmHg LVOT Vmax:         127.00 cm/s LVOT Vmean:        88.500 cm/s LVOT VTI:          0.264 m LVOT/AV VTI ratio: 0.33  AORTA Ao Root diam: 2.90 cm MITRAL VALVE MV Area (PHT): 6.65 cm     SHUNTS MV Area VTI:   2.69 cm     Systemic VTI:  0.26 m MV Peak grad:  8.4 mmHg     Systemic Diam: 2.00 cm MV Mean grad:  5.0 mmHg MV Vmax:       1.45 m/s MV Vmean:      110.0 cm/s MV Decel Time: 114 msec MV E velocity: 138.00 cm/s MV A velocity: 143.00 cm/s MV E/A ratio:  0.97 Bartholome Bill MD Electronically signed by Bartholome Bill MD Signature Date/Time: 11/27/2020/10:41:42 AM    Final     Assessment & Plan    87 y.o.femalewith history ofdiabetes,  hypertension, restless leg syndrome who was brought to the emergency room when she had an acute episode of dyspnea that woke her from sleep early this morning associated with orthopnea. She had worsening lower extremity edema and mild dyspnea on exertion recently. She denies any cough wheezing or chest pain. In the emergency room her blood gas revealed a pH of 7.36 with a PO2 of 109. She was had a sat of 90% on BiPAP. Her creatinine was 1.4 BUN of 22 with a brain natruretic peptide of 288.2 and a high-sensitivity of troponin at 17. Her D-dimer was 1.17. She was anemic with a hemoglobin of 8.8. EKG showed sinus rhythm with nonspecific ST-T wave changes. Chest x-ray revealed bilateral interstitial prominence consistent with possible mild congestive heart failure. She was given IV Lasix and a nebulizing treatment with IV steroids. Chest CT to rule out pulmonary embolus has been ordered and is still pending. Cardiology consultation was requested due to congestive heart failure. Per chart she is treated with amlodipine 10 mg daily, lisinopril hydrochlorothiazide 10-12.5 mg daily, oral antihyperglycemic drugs. She is also on rosuvastatin at 5 mg daily. She has been continued with her amlodipine and started on IV Lasix lisinopril hydrochlorothiazide being held. She is hemodynamically stable. Lower extremity Doppler revealed no DVT.  1.  Heart failure-has become volume overload on chest x-ray.  Echo showed preserved LV function with at least moderate aortic stenosis.  Etiology of heart failure may be multifactorial including diastolic as well as her aortic valvular disease.  Aortic valve disease does not appear to be critical.  This will need to be evaluated further after coming euvolemic.  Would likely do this as an outpatient.  We will continue careful diuresis following renal function and hemodynamics as well as electrolytes.  Renal function decreased slightly.  Still requiring nasal cannula oxygen.   Will need to continue careful diuresis probably for 1 more day.  Patient is anxious to go home but want to improve diuresis.  Will need to closely follow renal function.  Somewhat soft blood pressure but stable.  Pulse ox 99% on 4 L.  2.  Diabetes mellitus-continue with current regimen  3.  Hypertension-continue to follow hemodynamics while diuresing.  Avoid afterload reduction due to aortic stenosis.  Pressure somewhat soft.  4.  Aortic stenosis-moderate.  Does not appear critical.  Signed, Javier Docker. Dajohn Ellender MD 11/28/2020, 1:06 PM  Pager: [phone]

## 2020-11-28 NOTE — Progress Notes (Addendum)
PROGRESS NOTE    So Lisa Mcneil  [id number] DOB: Feb 16, 1928 DOA: 11/27/2020 PCP: Dion Body, MD    Assessment & Plan:   Active Problems:   Acute CHF (congestive heart failure) (HCC)   Acute diastolic CHF exacerbation: echo shows EF [id number], grade I diastolic function, no regional wall motion abnormalities & moderate aortic stenosis. Continue on IV lasix. Monitor I/Os. Cardio following and recs apprec   Aortic stenosis: moderate as per echo. Will be evaluated as an outpatient as per cardio   Acute hypoxic respiratory failure: likely secondary to CHF. Continue on supplemental oxygen and wean as tolerated   AKI on CKDIIIb: likely prerenal secondary to acute CHF. Cr is trending up slightly from day prior   ACD: likely secondary to CKD. No need for a transfusion currently   Elevated D-dimer: CTA was neg for PE. B/l LE [place] was neg for DVT   HTN: continue on home dose of amlodipine. Continue to hold lisinopril   HLD: continue on statin   DM2: likely well controlled. Continue on SSI w/ accuchecks. Continue to hold home dose of metformin   Peripheral neuropathy: continue on home dose of neurontin   RLS: continue on home dose of requip   GERD: continue on PPI   DVT prophylaxis: lovenox  Code Status: full  Family Communication:  Disposition Plan: likely d/c back home   Level of care: Progressive Cardiac   Status is: Inpatient  Remains inpatient appropriate because:IV treatments appropriate due to intensity of illness or inability to take PO and Inpatient level of care appropriate due to severity of illness   Dispo: The patient is from: Home              Anticipated d/c is to: Home              Patient currently is not medically stable to d/c.   Difficult to place patient No   Consultants:   Cardio    Procedures:    Antimicrobials:    Subjective: Pt c/o shortness of breath   Objective: Vitals:   11/27/20 1830 11/27/20 2035 11/28/20 0431  11/28/20 0811  BP: 134/72 (!) 148/58 (!) 142/53 (!) 133/52  Pulse: 79 82 72 78  Resp: '17 20 18 18  '$ Temp:  97.9 F (36.6 C) 97.7 F (36.5 C) 97.6 F (36.4 C)  TempSrc:  Oral Oral   SpO2: 99% 97% 99% 99%  Weight:   61.8 kg   Height:        Intake/Output Summary (Last 24 hours) at 11/28/2020 0815 Last data filed at 11/28/2020 0432 Gross per 24 hour  Intake --  Output 350 ml  Net -350 ml   Filed Weights   11/27/20 0415 11/28/20 0431  Weight: 67.3 kg 61.8 kg    Examination:  General exam: Appears calm and comfortable  Respiratory system: diminished breath sounds b/l  Cardiovascular system: S1 & S2 +. No  rubs, gallops or clicks. Gastrointestinal system: Abdomen is nondistended, soft and nontender. Normal bowel sounds heard. Central nervous system: Alert and oriented. Moves all 4 extremities  Psychiatry: Judgement and insight appear normal. Mood & affect appropriate.     Data Reviewed: I have personally reviewed following labs and imaging studies  CBC: Recent Labs  Lab 11/27/20 0419 11/28/20 0515  WBC 7.5 7.6  NEUTROABS 5.3  --   HGB 8.8* 8.9*  HCT 27.8* 27.4*  MCV 96.5 92.9  PLT 226 [id number]   Basic Metabolic Panel: Recent Labs  Lab 11/27/20 0419  11/28/20 0515  NA 139 137  K 3.9 3.8  CL 106 100  CO2 24 27  GLUCOSE 186* 161*  BUN 22 27*  CREATININE 1.40* 1.43*  CALCIUM 9.4 9.6   GFR: Estimated Creatinine Clearance: 22 mL/min (A) (by C-G formula based on SCr of 1.43 mg/dL (H)). Liver Function Tests: No results for input(s): AST, ALT, ALKPHOS, BILITOT, PROT, ALBUMIN in the last 168 hours. No results for input(s): LIPASE, AMYLASE in the last 168 hours. No results for input(s): AMMONIA in the last 168 hours. Coagulation Profile: No results for input(s): INR, PROTIME in the last 168 hours. Cardiac Enzymes: No results for input(s): CKTOTAL, CKMB, CKMBINDEX, TROPONINI in the last 168 hours. BNP (last 3 results) No results for input(s): PROBNP in the last 8760  hours. HbA1C: Recent Labs    11/27/20 0628  HGBA1C 5.6   CBG: Recent Labs  Lab 11/27/20 0716 11/27/20 1106 11/27/20 1736 11/27/20 2136 11/28/20 0808  GLUCAP 199* 325* 238* 143* 159*   Lipid Profile: No results for input(s): CHOL, HDL, LDLCALC, TRIG, CHOLHDL, LDLDIRECT in the last 72 hours. Thyroid Function Tests: No results for input(s): TSH, T4TOTAL, FREET4, T3FREE, THYROIDAB in the last 72 hours. Anemia Panel: No results for input(s): VITAMINB12, FOLATE, FERRITIN, TIBC, IRON, RETICCTPCT in the last 72 hours. Sepsis Labs: No results for input(s): PROCALCITON, LATICACIDVEN in the last 168 hours.  Recent Results (from the past 240 hour(s))  Resp Panel by RT-PCR (Flu A&B, Covid) Nasopharyngeal Swab     Status: None   Collection Time: 11/27/20  4:19 AM   Specimen: Nasopharyngeal Swab; Nasopharyngeal(NP) swabs in vial transport medium  Result Value Ref Range Status   SARS Coronavirus 2 by RT PCR NEGATIVE NEGATIVE Final    Comment: (NOTE) SARS-CoV-2 target nucleic acids are NOT DETECTED.  The SARS-CoV-2 RNA is generally detectable in upper respiratory specimens during the acute phase of infection. The lowest concentration of SARS-CoV-2 viral copies this assay can detect is 138 copies/mL. A negative result does not preclude SARS-Cov-2 infection and should not be used as the sole basis for treatment or other patient management decisions. A negative result may occur with  improper specimen collection/handling, submission of specimen other than nasopharyngeal swab, presence of viral mutation(s) within the areas targeted by this assay, and inadequate number of viral copies(<138 copies/mL). A negative result must be combined with clinical observations, patient history, and epidemiological information. The expected result is Negative.  Fact Sheet for Patients:  EntrepreneurPulse.com.au  Fact Sheet for Healthcare Providers:   IncredibleEmployment.be  This test is no t yet approved or cleared by the [place] FDA and  has been authorized for detection and/or diagnosis of SARS-CoV-2 by FDA under an Emergency Use Authorization (EUA). This EUA will remain  in effect (meaning this test can be used) for the duration of the COVID-19 declaration under Section 564(b)(1) of the Act, 21 U.S.C.section 360bbb-3(b)(1), unless the authorization is terminated  or revoked sooner.       Influenza A by PCR NEGATIVE NEGATIVE Final   Influenza B by PCR NEGATIVE NEGATIVE Final    Comment: (NOTE) The Xpert Xpress SARS-CoV-2/FLU/RSV plus assay is intended as an aid in the diagnosis of influenza from Nasopharyngeal swab specimens and should not be used as a sole basis for treatment. Nasal washings and aspirates are unacceptable for Xpert Xpress SARS-CoV-2/FLU/RSV testing.  Fact Sheet for Patients: EntrepreneurPulse.com.au  Fact Sheet for Healthcare Providers: IncredibleEmployment.be  This test is not yet approved or cleared by the [place]  FDA and has been authorized for detection and/or diagnosis of SARS-CoV-2 by FDA under an Emergency Use Authorization (EUA). This EUA will remain in effect (meaning this test can be used) for the duration of the COVID-19 declaration under Section 564(b)(1) of the Act, 21 U.S.C. section 360bbb-3(b)(1), unless the authorization is terminated or revoked.  Performed at [facility], [address] [place], [place] [address]          Radiology Studies: CT Angio Chest PE W and/or Wo Contrast  Result Date: 11/27/2020 CLINICAL DATA:  Shortness of breath. EXAM: CT ANGIOGRAPHY CHEST WITH CONTRAST TECHNIQUE: Multidetector CT imaging of the chest was performed using the standard protocol during bolus administration of intravenous contrast. Multiplanar CT image reconstructions and MIPs were obtained to evaluate the  vascular anatomy. CONTRAST:  39m OMNIPAQUE IOHEXOL 350 MG/ML SOLN COMPARISON:  None. FINDINGS: Cardiovascular: Satisfactory opacification of the pulmonary arteries to the segmental level. No evidence of pulmonary embolism. Mild cardiomegaly is noted. Coronary artery calcifications are noted. Atherosclerosis of thoracic aorta is noted without aneurysm or dissection. No pericardial effusion. Mediastinum/Nodes: Thyroid gland and esophagus are unremarkable. Calcified subcarinal and left hilar adenopathy is noted consistent with prior granulomatous disease. However, noncalcified adenopathy is noted in the precarinal area measuring 14 mm and in the AP window measuring 13 mm. Lungs/Pleura: Small bilateral pleural effusions are noted with adjacent subsegmental atelectasis of the lower lobes. No pneumothorax is noted. Interstitial densities are noted throughout both lungs concerning for pulmonary edema. Upper Abdomen: Calcified splenic granulomata are noted. Musculoskeletal: No chest wall abnormality. No acute or significant osseous findings. Review of the MIP images confirms the above findings. IMPRESSION: 1. No definite evidence of pulmonary embolus. 2. Coronary artery calcifications are noted suggesting coronary artery disease. 3. Small bilateral pleural effusions are noted with adjacent subsegmental atelectasis of the lower lobes. 4. Interstitial densities are noted throughout both lungs concerning for pulmonary edema. 5. Enlarged noncalcified mediastinal adenopathy is noted which may be inflammatory or neoplastic in etiology. 6. Calcified subcarinal left hilar adenopathy is noted most likely due to prior granulomatous disease. Aortic Atherosclerosis (ICD10-I70.0). Electronically Signed   By: JMarijo ConceptionM.D.   On: 11/27/2020 08:09   [place] Img Lower Unilateral Left (DVT)  Result Date: 11/27/2020 CLINICAL DATA:  Elevated D-dimer. EXAM: LEFT LOWER EXTREMITY VENOUS DOPPLER ULTRASOUND TECHNIQUE: Gray-scale  sonography with compression, as well as color and duplex ultrasound, were performed to evaluate the deep venous system(s) from the level of the common femoral vein through the popliteal and proximal calf veins. COMPARISON:  None. FINDINGS: VENOUS Normal compressibility of the common femoral, superficial femoral, and popliteal veins, as well as the visualized calf veins. Visualized portions of profunda femoral vein and great saphenous vein unremarkable. No filling defects to suggest DVT on grayscale or color Doppler imaging. Doppler waveforms show normal direction of venous flow, normal respiratory plasticity and response to augmentation. Limited views of the contralateral common femoral vein are unremarkable. OTHER None. IMPRESSION: Negative exam.  No evidence of DVT. Electronically Signed   By: TMarcello Moores Register   On: 11/27/2020 07:25   [place]ous Img Lower Unilateral Right  Result Date: 11/27/2020 CLINICAL DATA:  Right lower extremity swelling. EXAM: RIGHT LOWER EXTREMITY VENOUS DOPPLER ULTRASOUND TECHNIQUE: Gray-scale sonography with compression, as well as color and duplex ultrasound, were performed to evaluate the deep venous system(s) from the level of the common femoral vein through the popliteal and proximal calf veins. COMPARISON:  No prior. FINDINGS: VENOUS Normal compressibility  of the common femoral, superficial femoral, and popliteal veins, as well as the visualized calf veins. Visualized portions of profunda femoral vein and great saphenous vein unremarkable. No filling defects to suggest DVT on grayscale or color Doppler imaging. Doppler waveforms show normal direction of venous flow, normal respiratory plasticity and response to augmentation. Limited views of the contralateral common femoral vein are unremarkable. OTHER None. IMPRESSION: Negative exam.  No evidence of DVT. Electronically Signed   By: Marcello Moores  Register   On: 11/27/2020 05:26   DG Chest Portable 1 View  Result Date:  11/27/2020 CLINICAL DATA:  Shortness of breath. EXAM: PORTABLE CHEST 1 VIEW COMPARISON:  No prior. FINDINGS: Cardiomegaly. Bilateral interstitial prominence. No pleural effusion or pneumothorax. Degenerative change thoracic spine. IMPRESSION: 1. Cardiomegaly. 2. Bilateral interstitial prominence. Findings suggest mild CHF. Pneumonitis cannot be excluded. Electronically Signed   By: Marcello Moores  Register   On: 11/27/2020 05:03   ECHOCARDIOGRAM COMPLETE  Result Date: 11/27/2020    ECHOCARDIOGRAM REPORT   Patient Name:   KANDIE TAMAS Date of Exam: 11/27/2020 Medical Rec #:  [id number]          Height:       61.0 in Accession #:    [id number]         Weight:       148.4 lb Date of Birth:  January 09, 1928         BSA:          1.664 m Patient Age:    27 years           BP:           168/81 mmHg Patient Gender: F                  HR:           87 bpm. Exam Location:  [facility] Procedure: 2D Echo, Color Doppler and Cardiac Doppler Indications:     I50.31 CHF-Acute Diastolic  History:         Patient has no prior history of Echocardiogram examinations.                  Risk Factors:Hypertension and Diabetes.  Sonographer:     Charmayne Sheer RDCS (AE) Referring Phys:  [id number] Arvella Merles MANSY Diagnosing Phys: Bartholome Bill MD IMPRESSIONS  1. Left ventricular ejection fraction, by estimation, is 60 to 65%. The left ventricle has normal function. The left ventricle has no regional wall motion abnormalities. There is mild left ventricular hypertrophy. Left ventricular diastolic parameters are consistent with Grade I diastolic dysfunction (impaired relaxation).  2. Right ventricular systolic function is normal. The right ventricular size is mildly enlarged.  3. The mitral valve is grossly normal. Trivial mitral valve regurgitation.  4. The aortic valve is calcified. Aortic valve regurgitation is trivial. Moderate aortic valve stenosis. FINDINGS  Left Ventricle: Left ventricular ejection fraction, by estimation, is 60 to 65%. The left ventricle  has normal function. The left ventricle has no regional wall motion abnormalities. The left ventricular internal cavity size was normal in size. There is  mild left ventricular hypertrophy. Left ventricular diastolic parameters are consistent with Grade I diastolic dysfunction (impaired relaxation). Right Ventricle: The right ventricular size is mildly enlarged. No increase in right ventricular wall thickness. Right ventricular systolic function is normal. Left Atrium: Left atrial size was normal in size. Right Atrium: Right atrial size was normal in size. Pericardium: There is no evidence of pericardial effusion. Mitral Valve: The mitral valve is  grossly normal. Trivial mitral valve regurgitation. MV peak gradient, 8.4 mmHg. The mean mitral valve gradient is 5.0 mmHg. Tricuspid Valve: The tricuspid valve is not well visualized. Tricuspid valve regurgitation is trivial. Aortic Valve: The aortic valve is calcified. Aortic valve regurgitation is trivial. Moderate aortic stenosis is present. Aortic valve mean gradient measures 32.0 mmHg. Aortic valve peak gradient measures 62.1 mmHg. Aortic valve area, by VTI measures 1.05  cm. Pulmonic Valve: The pulmonic valve was not well visualized. Pulmonic valve regurgitation is trivial. Aorta: The aortic root is normal in size and structure. IAS/Shunts: The interatrial septum was not well visualized.  LEFT VENTRICLE PLAX 2D LVIDd:         4.70 cm  Diastology LVIDs:         2.50 cm  LV e' medial:    6.96 cm/s LV PW:         1.00 cm  LV E/e' medial:  19.8 LV IVS:        0.70 cm  LV e' lateral:   7.40 cm/s LVOT diam:     2.00 cm  LV E/e' lateral: 18.6 LV SV:         83 LV SV Index:   50 LVOT Area:     3.14 cm  RIGHT VENTRICLE RV Basal diam:  3.20 cm LEFT ATRIUM             Index       RIGHT ATRIUM           Index LA diam:        3.90 cm 2.34 cm/m  RA Area:     14.10 cm LA Vol (A2C):   55.4 ml 33.30 ml/m RA Volume:   33.80 ml  20.32 ml/m LA Vol (A4C):   87.4 ml 52.54 ml/m LA  Biplane Vol: 70.2 ml 42.20 ml/m  AORTIC VALVE                    PULMONIC VALVE AV Area (Vmax):    1.01 cm     PV Vmax:       1.08 m/s AV Area (Vmean):   1.08 cm     PV Vmean:      66.200 cm/s AV Area (VTI):     1.05 cm     PV VTI:        0.222 m AV Vmax:           394.00 cm/s  PV Peak grad:  4.7 mmHg AV Vmean:          258.000 cm/s PV Mean grad:  2.0 mmHg AV VTI:            0.793 m AV Peak Grad:      62.1 mmHg AV Mean Grad:      32.0 mmHg LVOT Vmax:         127.00 cm/s LVOT Vmean:        88.500 cm/s LVOT VTI:          0.264 m LVOT/AV VTI ratio: 0.33  AORTA Ao Root diam: 2.90 cm MITRAL VALVE MV Area (PHT): 6.65 cm     SHUNTS MV Area VTI:   2.69 cm     Systemic VTI:  0.26 m MV Peak grad:  8.4 mmHg     Systemic Diam: 2.00 cm MV Mean grad:  5.0 mmHg MV Vmax:       1.45 m/s MV Vmean:      110.0 cm/s MV Decel Time:  114 msec MV E velocity: 138.00 cm/s MV A velocity: 143.00 cm/s MV E/A ratio:  0.97 Bartholome Bill MD Electronically signed by Bartholome Bill MD Signature Date/Time: 11/27/2020/10:41:42 AM    Final         Scheduled Meds: . enoxaparin (LOVENOX) injection  30 mg Subcutaneous Q24H  . furosemide  20 mg Intravenous Q12H  . insulin aspart  0-9 Units Subcutaneous TID PC & HS  . multivitamin with minerals  1 tablet Oral Daily  . pantoprazole  40 mg Oral Daily  . potassium chloride  40 mEq Oral Daily  . pramipexole  0.25 mg Oral Daily  . rosuvastatin  5 mg Oral Daily  . traZODone  50 mg Oral QHS   Continuous Infusions:   LOS: 1 day    Time spent: 34 mins     Wyvonnia Dusky, MD [facility] Pager [phone]  If 7PM-7AM, please contact night-coverage 11/28/2020, 8:15 AM

## 2020-11-28 NOTE — Consult Note (Addendum)
   Heart Failure Nurse Navigator Note  HFpEF 60 to 65%.  Grade 1 diastolic dysfunction.  Moderate aortic stenosis.  She presented to the emergency room after getting up to go to the bathroom in the middle of the night and could not catch her breath with that activity.  He also noted lower extremity edema.   Comorbidities:  Diabetes type 2 Hypertension Iron deficiency anemia  Medications:  Furosemide 20 mg IV every 12 hours Potassium chloride 40 mEq daily Crestor 5 mg daily  Labs:  Sodium 137, potassium 3.8, chloride 100, CO2 27, BUN 27, creatinine 1.43.  BNP on admission was 288 Intake not documented Output 350 mL  Assessment:  General-she is awake and alert lying in bed in no acute distress.  HEENT-normocephalic, no JVD noted.  Cardiac-heart tones of regular rate and rhythm, positive for systolic murmur, best auscultated in the right upper sternal border.  Chest-breath sounds are clear to posterior auscultation.  Abdomen-soft nontender.  Lower extremities-there is no edema noted.  Psych-is pleasant and appropriate  Neurologic-speech is clear, moves all extremities without difficulty   Initial meeting with patient today.  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  She states that she has never had anything like this happen to her before.  Discussed her symptoms along with other symptoms to report to physician.  Discussed diet, she lives alone and she does not cooking she does on occasion eat out, discussed making healthy choices when she does that.  She does not use salt at the table.  Also discussed limiting fluid intake to no more than eight 8 ounce cups in a days time.  Discussed the living with heart failure teaching booklet along with his zone magnet.  Stressed the importance of weighing daily and recording and what to report to physician.   She was given an appointment for  the heart failure clinic along with information about the heart failure clinic.  Pricilla Riffle RN [facility]

## 2020-11-28 NOTE — Progress Notes (Signed)
RN entered room to find patient without nasal cannula in place; checked o2 on room air to find patient with O2 sat of 85%. Reapplied O2 on 2L and oxygen rose to 94% after several deep breaths. Encouraged patient to leave [place] in place.

## 2020-11-28 NOTE — Progress Notes (Signed)
Mobility Specialist - Progress Note   11/28/20 1200  Mobility  Activity Ambulated to bathroom  Level of Assistance Independent  Assistive Device None  Distance Ambulated (ft) 45 ft  Mobility Response Tolerated well  Mobility performed by Mobility specialist  $Mobility charge 1 Mobility    Pt ambulated to bathroom without AD. Independent in hygiene tasks, ambulation and bed mobility. Supervision to stand d/t refusal of slip resistant socks. No LOB.   Kathee Delton Mobility Specialist 11/28/20, 12:32 PM

## 2020-11-29 DIAGNOSIS — N179 Acute kidney failure, unspecified: Secondary | ICD-10-CM

## 2020-11-29 LAB — CBC
HCT: 27.6 % — ABNORMAL LOW (ref 36.0–46.0)
Hemoglobin: 8.8 g/dL — ABNORMAL LOW (ref 12.0–15.0)
MCH: 30 pg (ref 26.0–34.0)
MCHC: 31.9 g/dL (ref 30.0–36.0)
MCV: 94.2 fL (ref 80.0–100.0)
Platelets: 242 10*3/uL (ref 150–400)
RBC: 2.93 MIL/uL — ABNORMAL LOW (ref 3.87–5.11)
RDW: 12.9 % (ref 11.5–15.5)
WBC: 6.2 10*3/uL (ref 4.0–10.5)
nRBC: 0 % (ref 0.0–0.2)

## 2020-11-29 LAB — BASIC METABOLIC PANEL
Anion gap: 11 (ref 5–15)
BUN: 46 mg/dL — ABNORMAL HIGH (ref 8–23)
CO2: 28 mmol/L (ref 22–32)
Calcium: 9.4 mg/dL (ref 8.9–10.3)
Chloride: 97 mmol/L — ABNORMAL LOW (ref 98–111)
Creatinine, Ser: 2.05 mg/dL — ABNORMAL HIGH (ref 0.44–1.00)
GFR, Estimated: 23 mL/min — ABNORMAL LOW (ref >60)
Glucose, Bld: 125 mg/dL — ABNORMAL HIGH (ref 70–99)
Potassium: 4 mmol/L (ref 3.5–5.1)
Sodium: 136 mmol/L (ref 135–145)

## 2020-11-29 LAB — GLUCOSE, CAPILLARY
Glucose-Capillary: 116 mg/dL — ABNORMAL HIGH (ref 70–99)
Glucose-Capillary: 149 mg/dL — ABNORMAL HIGH (ref 70–99)
Glucose-Capillary: 216 mg/dL — ABNORMAL HIGH (ref 70–99)

## 2020-11-29 MED ORDER — FUROSEMIDE 20 MG PO TABS: 20.0000 mg | ORAL_TABLET | Freq: Every day | ORAL | 0 refills | Status: DC

## 2020-11-29 MED ORDER — FUROSEMIDE 20 MG PO TABS
20.0000 mg | ORAL_TABLET | Freq: Every day | ORAL | Status: DC
  Administered 2020-11-29: 20 mg via ORAL
  Filled 2020-11-29: qty 1

## 2020-11-29 NOTE — Progress Notes (Signed)
Patient tolerated walking without any issues of SOB.  Patient O2 sats did drop after 3 laps around unit to 88%, but recovered quickly with deep breathing exercise and rest.     Per MD aware- no need for O2 at this time- will follow up with PCP and cardiology for continue monitoring

## 2020-11-29 NOTE — Discharge Summary (Signed)
Physician Discharge Summary  Lisa Mcneil [id number] DOB: [birth year] DOA: 11/27/2020  PCP: Dion Body, MD  Admit date: 11/27/2020 Discharge date: 11/29/2020  Admitted From: home  Disposition:  Home   Recommendations for Outpatient Follow-up:  1. Follow up with PCP in 1-2 weeks 2. F/u cardio, Dr. Ubaldo Glassing, in 3-4 days & Cr will be f/u as well   Home Health: no  Equipment/Devices:  Discharge Condition: stable  CODE STATUS: DNR Diet recommendation: Heart Healthy / Carb Modified  Brief/Interim Summary: HPI was taken from Dr. Sidney Ace: Lisa Mcneil is a 87 y.o. Caucasian female with medical history significant for type 2 diabetes mellitus, hypertension, GERD and restless leg syndrome, who presented to the emergency room with acute onset of dyspnea that woke her up from sleep at 2 AM likely with associated mild orthopnea.  She has been having worsening lower extremity edema and mild dyspnea on exertion with her symptoms.  She denies any dysuria, oliguria or hematuria or flank pain.  No fever or chills.  No cough or wheezing.  No headache or dizziness or blurred vision.  ED Course: Labs revealed an ABG with pH 7.36, PCO2 40 and PO2 109 with HCO3 of 22.6 and O2 sat of 90% on BiPAP.  BMP was remarkable for a BUN of 22 and creatinine 1.4 and glucose 186.  BNP was 288.2 and high-sensitivity troponin I 17.  CBC showed anemia with hemoglobin of 8.8 and hematocrit 27.8 compared to 9.1 and 27.8 on 10/17/2020.  D-dimer her came back 1.17.  Respiratory panel is currently pending. EKG as reviewed by me :EKG showed normal sinus rhythm with a rate of 92 with slightly depressed ST segment anterolaterally Imaging: Portable chest ray showed cardiomegaly and bilateral interstitial prominence suggesting mild acute CHF.  The patient was given nebulizer albuterol, 40 mg IV Lasix and nebulized Atrovent as well as 125 mg of IV Solu-Medrol.  She will be admitted to a progressive unit bed for further  evaluation and management.  Hospital course from Dr. Jimmye Norman 4/20-4/21/22: Pt was found to have acute CHF exacerbation and treated w/ lasix. Cr trending up w/ lasix use and ACE-I was held while inpatient. Of note, pt was found to have moderate aortic stenosis on echo and will be evaluated further as an outpatient as per cardio. Pt did require supplemental oxygen while inpatient but this was able to be weaned off prior to d/c. Pt did not need therapy as pt was independent w/ ambulating and transferring. For more information, please see previous progress/consult notes.    Discharge Diagnoses:  Active Problems:   Acute CHF (congestive heart failure) (HCC)  Acute diastolic CHF exacerbation: echo shows EF [id number], grade I diastolic function, no regional wall motion abnormalities & moderate aortic stenosis. Continue on IV lasix. Monitor I/Os. Cardio following and recs apprec   Aortic stenosis: moderate as per echo. Will be evaluated as an outpatient as per cardio   Acute hypoxic respiratory failure: likely secondary to CHF. Weaned off of supplemental oxygen   AKI on CKDIIIb: likely secondary to lasix & ACE-I use. Cr is trending up today  ACD: likely secondary to CKD. No need for a transfusion currently   Elevated D-dimer: CTA was neg for PE. B/l LE [place] was neg for DVT   HTN: continue on home dose of amlodipine. Continue to hold lisinopril   HLD: continue on statin   DM2: likely well controlled. Continue on SSI w/ accuchecks. Continue to hold home dose of metformin   Peripheral neuropathy: continue  on home dose of neurontin   RLS: continue on home dose of requip   GERD: continue on PPI   Discharge Instructions  Discharge Instructions    Diet - low sodium heart healthy   Complete by: As directed    Diet Carb Modified   Complete by: As directed    Discharge instructions   Complete by: As directed    F/u w/ cardio, Dr. Ubaldo Glassing, within 4-5 days. F/u w/ PCP in 1-2 weeks   Increase  activity slowly   Complete by: As directed      Allergies as of 11/29/2020      Reactions   Codeine    Demerol [meperidine]    Polysaccharide Iron Complex Other (See Comments)   Caused constipation   Naproxen Rash   Caused rash on mouth      Medication List    TAKE these medications   amLODipine 10 MG tablet Commonly known as: NORVASC Take 10 mg by mouth daily.   Caltrate 600+D Plus Minerals 600-800 MG-UNIT Chew Chew 1 tablet by mouth as directed.   ferrous sulfate 325 (65 FE) MG tablet Take 325 mg by mouth daily.   furosemide 20 MG tablet Commonly known as: LASIX Take 1 tablet (20 mg total) by mouth daily. Start taking on: November 30, 2020   gabapentin 100 MG capsule Commonly known as: NEURONTIN Take 100 mg by mouth at bedtime.   lisinopril-hydrochlorothiazide 20-12.5 MG tablet Commonly known as: ZESTORETIC Take 1 tablet by mouth daily.   metFORMIN 500 MG tablet Commonly known as: GLUCOPHAGE Take 500 mg by mouth 2 (two) times daily with a meal.   multivitamin with minerals tablet Take 1 tablet by mouth daily.   omeprazole 20 MG capsule Commonly known as: PRILOSEC Take 20 mg by mouth 2 (two) times daily.   pramipexole 0.25 MG tablet Commonly known as: MIRAPEX Take 0.25 mg by mouth in the morning and at bedtime.   rosuvastatin 5 MG tablet Commonly known as: CRESTOR Take 5 mg by mouth at bedtime.   traZODone 50 MG tablet Commonly known as: DESYREL Take 50 mg by mouth at bedtime.       Allergies  Allergen Reactions  . Codeine   . Demerol [Meperidine]   . Polysaccharide Iron Complex Other (See Comments)    Caused constipation  . Naproxen Rash    Caused rash on mouth    Consultations:  Cardio, Dr. Ubaldo Glassing    Procedures/Studies: CT Angio Chest PE W and/or Wo Contrast  Result Date: 11/27/2020 CLINICAL DATA:  Shortness of breath. EXAM: CT ANGIOGRAPHY CHEST WITH CONTRAST TECHNIQUE: Multidetector CT imaging of the chest was performed using the  standard protocol during bolus administration of intravenous contrast. Multiplanar CT image reconstructions and MIPs were obtained to evaluate the vascular anatomy. CONTRAST:  79m OMNIPAQUE IOHEXOL 350 MG/ML SOLN COMPARISON:  None. FINDINGS: Cardiovascular: Satisfactory opacification of the pulmonary arteries to the segmental level. No evidence of pulmonary embolism. Mild cardiomegaly is noted. Coronary artery calcifications are noted. Atherosclerosis of thoracic aorta is noted without aneurysm or dissection. No pericardial effusion. Mediastinum/Nodes: Thyroid gland and esophagus are unremarkable. Calcified subcarinal and left hilar adenopathy is noted consistent with prior granulomatous disease. However, noncalcified adenopathy is noted in the precarinal area measuring 14 mm and in the AP window measuring 13 mm. Lungs/Pleura: Small bilateral pleural effusions are noted with adjacent subsegmental atelectasis of the lower lobes. No pneumothorax is noted. Interstitial densities are noted throughout both lungs concerning for pulmonary edema. Upper Abdomen:  Calcified splenic granulomata are noted. Musculoskeletal: No chest wall abnormality. No acute or significant osseous findings. Review of the MIP images confirms the above findings. IMPRESSION: 1. No definite evidence of pulmonary embolus. 2. Coronary artery calcifications are noted suggesting coronary artery disease. 3. Small bilateral pleural effusions are noted with adjacent subsegmental atelectasis of the lower lobes. 4. Interstitial densities are noted throughout both lungs concerning for pulmonary edema. 5. Enlarged noncalcified mediastinal adenopathy is noted which may be inflammatory or neoplastic in etiology. 6. Calcified subcarinal left hilar adenopathy is noted most likely due to prior granulomatous disease. Aortic Atherosclerosis (ICD10-I70.0). Electronically Signed   By: Marijo Conception M.D.   On: 11/27/2020 08:09   US Venous Img Lower Unilateral Left  (DVT)  Result Date: 11/27/2020 CLINICAL DATA:  Elevated D-dimer. EXAM: LEFT LOWER EXTREMITY VENOUS DOPPLER ULTRASOUND TECHNIQUE: Gray-scale sonography with compression, as well as color and duplex ultrasound, were performed to evaluate the deep venous system(s) from the level of the common femoral vein through the popliteal and proximal calf veins. COMPARISON:  None. FINDINGS: VENOUS Normal compressibility of the common femoral, superficial femoral, and popliteal veins, as well as the visualized calf veins. Visualized portions of profunda femoral vein and great saphenous vein unremarkable. No filling defects to suggest DVT on grayscale or color Doppler imaging. Doppler waveforms show normal direction of venous flow, normal respiratory plasticity and response to augmentation. Limited views of the contralateral common femoral vein are unremarkable. OTHER None. IMPRESSION: Negative exam.  No evidence of DVT. Electronically Signed   By: Marcello Moores  Register   On: 11/27/2020 07:25   US Venous Img Lower Unilateral Right  Result Date: 11/27/2020 CLINICAL DATA:  Right lower extremity swelling. EXAM: RIGHT LOWER EXTREMITY VENOUS DOPPLER ULTRASOUND TECHNIQUE: Gray-scale sonography with compression, as well as color and duplex ultrasound, were performed to evaluate the deep venous system(s) from the level of the common femoral vein through the popliteal and proximal calf veins. COMPARISON:  No prior. FINDINGS: VENOUS Normal compressibility of the common femoral, superficial femoral, and popliteal veins, as well as the visualized calf veins. Visualized portions of profunda femoral vein and great saphenous vein unremarkable. No filling defects to suggest DVT on grayscale or color Doppler imaging. Doppler waveforms show normal direction of venous flow, normal respiratory plasticity and response to augmentation. Limited views of the contralateral common femoral vein are unremarkable. OTHER None. IMPRESSION: Negative exam.  No  evidence of DVT. Electronically Signed   By: Marcello Moores  Register   On: 11/27/2020 05:26   DG Chest Portable 1 View  Result Date: 11/27/2020 CLINICAL DATA:  Shortness of breath. EXAM: PORTABLE CHEST 1 VIEW COMPARISON:  No prior. FINDINGS: Cardiomegaly. Bilateral interstitial prominence. No pleural effusion or pneumothorax. Degenerative change thoracic spine. IMPRESSION: 1. Cardiomegaly. 2. Bilateral interstitial prominence. Findings suggest mild CHF. Pneumonitis cannot be excluded. Electronically Signed   By: Marcello Moores  Register   On: 11/27/2020 05:03   ECHOCARDIOGRAM COMPLETE  Result Date: 11/27/2020    ECHOCARDIOGRAM REPORT   Patient Name:   Lisa Mcneil Date of Exam: 11/27/2020 Medical Rec #:  [id number]          Height:       61.0 in Accession #:    [id number]         Weight:       148.4 lb Date of Birth:  1927-12-01         BSA:          1.664 m Patient Age:    45  years           BP:           168/81 mmHg Patient Gender: F                  HR:           87 bpm. Exam Location:  [facility] Procedure: 2D Echo, Color Doppler and Cardiac Doppler Indications:     I50.31 CHF-Acute Diastolic  History:         Patient has no prior history of Echocardiogram examinations.                  Risk Factors:Hypertension and Diabetes.  Sonographer:     Charmayne Sheer RDCS (AE) Referring Phys:  [id number] Arvella Merles MANSY Diagnosing Phys: Bartholome Bill MD IMPRESSIONS  1. Left ventricular ejection fraction, by estimation, is 60 to 65%. The left ventricle has normal function. The left ventricle has no regional wall motion abnormalities. There is mild left ventricular hypertrophy. Left ventricular diastolic parameters are consistent with Grade I diastolic dysfunction (impaired relaxation).  2. Right ventricular systolic function is normal. The right ventricular size is mildly enlarged.  3. The mitral valve is grossly normal. Trivial mitral valve regurgitation.  4. The aortic valve is calcified. Aortic valve regurgitation is trivial. Moderate  aortic valve stenosis. FINDINGS  Left Ventricle: Left ventricular ejection fraction, by estimation, is 60 to 65%. The left ventricle has normal function. The left ventricle has no regional wall motion abnormalities. The left ventricular internal cavity size was normal in size. There is  mild left ventricular hypertrophy. Left ventricular diastolic parameters are consistent with Grade I diastolic dysfunction (impaired relaxation). Right Ventricle: The right ventricular size is mildly enlarged. No increase in right ventricular wall thickness. Right ventricular systolic function is normal. Left Atrium: Left atrial size was normal in size. Right Atrium: Right atrial size was normal in size. Pericardium: There is no evidence of pericardial effusion. Mitral Valve: The mitral valve is grossly normal. Trivial mitral valve regurgitation. MV peak gradient, 8.4 mmHg. The mean mitral valve gradient is 5.0 mmHg. Tricuspid Valve: The tricuspid valve is not well visualized. Tricuspid valve regurgitation is trivial. Aortic Valve: The aortic valve is calcified. Aortic valve regurgitation is trivial. Moderate aortic stenosis is present. Aortic valve mean gradient measures 32.0 mmHg. Aortic valve peak gradient measures 62.1 mmHg. Aortic valve area, by VTI measures 1.05  cm. Pulmonic Valve: The pulmonic valve was not well visualized. Pulmonic valve regurgitation is trivial. Aorta: The aortic root is normal in size and structure. IAS/Shunts: The interatrial septum was not well visualized.  LEFT VENTRICLE PLAX 2D LVIDd:         4.70 cm  Diastology LVIDs:         2.50 cm  LV e' medial:    6.96 cm/s LV PW:         1.00 cm  LV E/e' medial:  19.8 LV IVS:        0.70 cm  LV e' lateral:   7.40 cm/s LVOT diam:     2.00 cm  LV E/e' lateral: 18.6 LV SV:         83 LV SV Index:   50 LVOT Area:     3.14 cm  RIGHT VENTRICLE RV Basal diam:  3.20 cm LEFT ATRIUM             Index       RIGHT ATRIUM  Index LA diam:        3.90 cm 2.34 cm/m   RA Area:     14.10 cm LA Vol (A2C):   55.4 ml 33.30 ml/m RA Volume:   33.80 ml  20.32 ml/m LA Vol (A4C):   87.4 ml 52.54 ml/m LA Biplane Vol: 70.2 ml 42.20 ml/m  AORTIC VALVE                    PULMONIC VALVE AV Area (Vmax):    1.01 cm     PV Vmax:       1.08 m/s AV Area (Vmean):   1.08 cm     PV Vmean:      66.200 cm/s AV Area (VTI):     1.05 cm     PV VTI:        0.222 m AV Vmax:           394.00 cm/s  PV Peak grad:  4.7 mmHg AV Vmean:          258.000 cm/s PV Mean grad:  2.0 mmHg AV VTI:            0.793 m AV Peak Grad:      62.1 mmHg AV Mean Grad:      32.0 mmHg LVOT Vmax:         127.00 cm/s LVOT Vmean:        88.500 cm/s LVOT VTI:          0.264 m LVOT/AV VTI ratio: 0.33  AORTA Ao Root diam: 2.90 cm MITRAL VALVE MV Area (PHT): 6.65 cm     SHUNTS MV Area VTI:   2.69 cm     Systemic VTI:  0.26 m MV Peak grad:  8.4 mmHg     Systemic Diam: 2.00 cm MV Mean grad:  5.0 mmHg MV Vmax:       1.45 m/s MV Vmean:      110.0 cm/s MV Decel Time: 114 msec MV E velocity: 138.00 cm/s MV A velocity: 143.00 cm/s MV E/A ratio:  0.97 Bartholome Bill MD Electronically signed by Bartholome Bill MD Signature Date/Time: 11/27/2020/10:41:42 AM    Final      Subjective: Pt denies any complaints    Discharge Exam: Vitals:   11/29/20 0803 11/29/20 1159  BP: (!) 125/48 114/63  Pulse: 62 73  Resp: 18 18  Temp: 97.7 F (36.5 C) 97.6 F (36.4 C)  SpO2: 98% 99%   Vitals:   11/28/20 1943 11/29/20 0434 11/29/20 0803 11/29/20 1159  BP: (!) 145/62 (!) 138/55 (!) 125/48 114/63  Pulse: 70 64 62 73  Resp:  '19 18 18  '$ Temp: 97.8 F (36.6 C) 97.9 F (36.6 C) 97.7 F (36.5 C) 97.6 F (36.4 C)  TempSrc: Oral     SpO2: 92% 95% 98% 99%  Weight:  59.2 kg    Height:        General: Pt is alert, awake, not in acute distress Cardiovascular: S1/S2 +, no rubs, no gallops Respiratory: CTA bilaterally, no wheezing, no rhonchi Abdominal: Soft, NT, ND, bowel sounds + Extremities:  no cyanosis    The results of significant  diagnostics from this hospitalization (including imaging, microbiology, ancillary and laboratory) are listed below for reference.     Microbiology: Recent Results (from the past 240 hour(s))  Resp Panel by RT-PCR (Flu A&B, Covid) Nasopharyngeal Swab     Status: None   Collection Time: 11/27/20  4:19 AM   Specimen: Nasopharyngeal  Swab; Nasopharyngeal(NP) swabs in vial transport medium  Result Value Ref Range Status   SARS Coronavirus 2 by RT PCR NEGATIVE NEGATIVE Final    Comment: (NOTE) SARS-CoV-2 target nucleic acids are NOT DETECTED.  The SARS-CoV-2 RNA is generally detectable in upper respiratory specimens during the acute phase of infection. The lowest concentration of SARS-CoV-2 viral copies this assay can detect is 138 copies/mL. A negative result does not preclude SARS-Cov-2 infection and should not be used as the sole basis for treatment or other patient management decisions. A negative result may occur with  improper specimen collection/handling, submission of specimen other than nasopharyngeal swab, presence of viral mutation(s) within the areas targeted by this assay, and inadequate number of viral copies(<138 copies/mL). A negative result must be combined with clinical observations, patient history, and epidemiological information. The expected result is Negative.  Fact Sheet for Patients:  EntrepreneurPulse.com.au  Fact Sheet for Healthcare Providers:  IncredibleEmployment.be  This test is no t yet approved or cleared by the [place] FDA and  has been authorized for detection and/or diagnosis of SARS-CoV-2 by FDA under an Emergency Use Authorization (EUA). This EUA will remain  in effect (meaning this test can be used) for the duration of the COVID-19 declaration under Section 564(b)(1) of the Act, 21 U.S.C.section 360bbb-3(b)(1), unless the authorization is terminated  or revoked sooner.       Influenza A by PCR NEGATIVE  NEGATIVE Final   Influenza B by PCR NEGATIVE NEGATIVE Final    Comment: (NOTE) The Xpert Xpress SARS-CoV-2/FLU/RSV plus assay is intended as an aid in the diagnosis of influenza from Nasopharyngeal swab specimens and should not be used as a sole basis for treatment. Nasal washings and aspirates are unacceptable for Xpert Xpress SARS-CoV-2/FLU/RSV testing.  Fact Sheet for Patients: EntrepreneurPulse.com.au  Fact Sheet for Healthcare Providers: IncredibleEmployment.be  This test is not yet approved or cleared by the [place] FDA and has been authorized for detection and/or diagnosis of SARS-CoV-2 by FDA under an Emergency Use Authorization (EUA). This EUA will remain in effect (meaning this test can be used) for the duration of the COVID-19 declaration under Section 564(b)(1) of the Act, 21 U.S.C. section 360bbb-3(b)(1), unless the authorization is terminated or revoked.  Performed at [facility], [address] [place], [place] [address]      Labs: BNP (last 3 results) Recent Labs    11/27/20 0419  BNP [id number]*   Basic Metabolic Panel: Recent Labs  Lab 11/27/20 0419 11/28/20 0515 11/29/20 0506  NA 139 137 136  K 3.9 3.8 4.0  CL 106 100 97*  CO2 '24 27 28  '$ GLUCOSE 186* 161* 125*  BUN 22 27* 46*  CREATININE 1.40* 1.43* 2.05*  CALCIUM 9.4 9.6 9.4   Liver Function Tests: No results for input(s): AST, ALT, ALKPHOS, BILITOT, PROT, ALBUMIN in the last 168 hours. No results for input(s): LIPASE, AMYLASE in the last 168 hours. No results for input(s): AMMONIA in the last 168 hours. CBC: Recent Labs  Lab 11/27/20 0419 11/28/20 0515 11/29/20 0506  WBC 7.5 7.6 6.2  NEUTROABS 5.3  --   --   HGB 8.8* 8.9* 8.8*  HCT 27.8* 27.4* 27.6*  MCV 96.5 92.9 94.2  PLT 226 244 242   Cardiac Enzymes: No results for input(s): CKTOTAL, CKMB, CKMBINDEX, TROPONINI in the last 168 hours. BNP: Invalid input(s): POCBNP CBG: Recent  Labs  Lab 11/28/20 1702 11/28/20 2114 11/29/20 0004 11/29/20 0802 11/29/20 1158  GLUCAP 141* 175* 116* 149*  216*   D-Dimer Recent Labs    11/27/20 0419  DDIMER 1.17*   Hgb A1c Recent Labs    11/27/20 0628  HGBA1C 5.6   Lipid Profile No results for input(s): CHOL, HDL, LDLCALC, TRIG, CHOLHDL, LDLDIRECT in the last 72 hours. Thyroid function studies No results for input(s): TSH, T4TOTAL, T3FREE, THYROIDAB in the last 72 hours.  Invalid input(s): FREET3 Anemia work up No results for input(s): VITAMINB12, FOLATE, FERRITIN, TIBC, IRON, RETICCTPCT in the last 72 hours. Urinalysis    Component Value Date/Time   COLORURINE Yellow 06/23/2014 2320   APPEARANCEUR Cloudy 06/23/2014 2320   LABSPEC 1.015 06/23/2014 2320   PHURINE 5.0 06/23/2014 2320   GLUCOSEU Negative 06/23/2014 2320   HGBUR Negative 06/23/2014 2320   BILIRUBINUR Negative 06/23/2014 2320   KETONESUR Negative 06/23/2014 2320   PROTEINUR 30 mg/dL 06/23/2014 2320   NITRITE Negative 06/23/2014 2320   LEUKOCYTESUR 3+ 06/23/2014 2320   Sepsis Labs Invalid input(s): PROCALCITONIN,  WBC,  LACTICIDVEN Microbiology Recent Results (from the past 240 hour(s))  Resp Panel by RT-PCR (Flu A&B, Covid) Nasopharyngeal Swab     Status: None   Collection Time: 11/27/20  4:19 AM   Specimen: Nasopharyngeal Swab; Nasopharyngeal(NP) swabs in vial transport medium  Result Value Ref Range Status   SARS Coronavirus 2 by RT PCR NEGATIVE NEGATIVE Final    Comment: (NOTE) SARS-CoV-2 target nucleic acids are NOT DETECTED.  The SARS-CoV-2 RNA is generally detectable in upper respiratory specimens during the acute phase of infection. The lowest concentration of SARS-CoV-2 viral copies this assay can detect is 138 copies/mL. A negative result does not preclude SARS-Cov-2 infection and should not be used as the sole basis for treatment or other patient management decisions. A negative result may occur with  improper specimen  collection/handling, submission of specimen other than nasopharyngeal swab, presence of viral mutation(s) within the areas targeted by this assay, and inadequate number of viral copies(<138 copies/mL). A negative result must be combined with clinical observations, patient history, and epidemiological information. The expected result is Negative.  Fact Sheet for Patients:  EntrepreneurPulse.com.au  Fact Sheet for Healthcare Providers:  IncredibleEmployment.be  This test is no t yet approved or cleared by the [place] FDA and  has been authorized for detection and/or diagnosis of SARS-CoV-2 by FDA under an Emergency Use Authorization (EUA). This EUA will remain  in effect (meaning this test can be used) for the duration of the COVID-19 declaration under Section 564(b)(1) of the Act, 21 U.S.C.section 360bbb-3(b)(1), unless the authorization is terminated  or revoked sooner.       Influenza A by PCR NEGATIVE NEGATIVE Final   Influenza B by PCR NEGATIVE NEGATIVE Final    Comment: (NOTE) The Xpert Xpress SARS-CoV-2/FLU/RSV plus assay is intended as an aid in the diagnosis of influenza from Nasopharyngeal swab specimens and should not be used as a sole basis for treatment. Nasal washings and aspirates are unacceptable for Xpert Xpress SARS-CoV-2/FLU/RSV testing.  Fact Sheet for Patients: EntrepreneurPulse.com.au  Fact Sheet for Healthcare Providers: IncredibleEmployment.be  This test is not yet approved or cleared by the [place] FDA and has been authorized for detection and/or diagnosis of SARS-CoV-2 by FDA under an Emergency Use Authorization (EUA). This EUA will remain in effect (meaning this test can be used) for the duration of the COVID-19 declaration under Section 564(b)(1) of the Act, 21 U.S.C. section 360bbb-3(b)(1), unless the authorization is terminated or revoked.  Performed at [facility], [address],  [place], [place] [address]      Time coordinating discharge: Over 30 minutes  SIGNED:   Wyvonnia Dusky, MD  [facility] 11/29/2020, 1:10 PM Pager   If 7PM-7AM, please contact night-coverage

## 2020-11-29 NOTE — Progress Notes (Signed)
Patient Name: Lisa Mcneil Date of Encounter: 11/29/2020  Hospital Problem List     Active Problems:   Acute CHF (congestive heart failure) [facility])    Patient Profile      87 y.o.femalewith history ofdiabetes, hypertension, restless leg syndrome who was brought to the emergency room when she had an acute episode of dyspnea that woke her from sleep early this morning associated with orthopnea. She had worsening lower extremity edema and mild dyspnea on exertion recently. She denies any cough wheezing or chest pain. In the emergency room her blood gas revealed a pH of 7.36 with a PO2 of 109. She was had a sat of 90% on BiPAP. Her creatinine was 1.4 BUN of 22 with a brain natruretic peptide of 288.2 and a high-sensitivity of troponin at 17. Her D-dimer was 1.17. She was anemic with a hemoglobin of 8.8. EKG showed sinus rhythm with nonspecific ST-T wave changes. Chest x-ray revealed bilateral interstitial prominence consistent with possible mild congestive heart failure. She was given IV Lasix and a nebulizing treatment with IV steroids. Chest CT to rule out pulmonary embolus has been ordered and is still pending. Cardiology consultation was requested due to congestive heart failure. Echocardiogram showed normal LV function with moderate aortic stenosis.  Per chart she is treated with amlodipine 10 mg daily, lisinopril hydrochlorothiazide 10-12.5 mg daily, oral antihyperglycemic drugs. She is also on rosuvastatin at 5 mg daily. She has been continued with her amlodipine and started on IV Lasix lisinopril hydrochlorothiazide being held. She is hemodynamically stable. Lower extremity Doppler revealed no DVT.  Subjective   Feels better and states she feels back to her baseline and is anxious to go home.   Inpatient Medications    . enoxaparin (LOVENOX) injection  30 mg Subcutaneous Q24H  . furosemide  20 mg Oral Daily  . insulin aspart  0-9 Units Subcutaneous TID PC & HS  .  multivitamin with minerals  1 tablet Oral Daily  . pantoprazole  40 mg Oral Daily  . potassium chloride  40 mEq Oral Daily  . pramipexole  0.25 mg Oral Daily  . rosuvastatin  5 mg Oral Daily  . traZODone  50 mg Oral QHS    Vital Signs    Vitals:   11/28/20 1513 11/28/20 1943 11/29/20 0434 11/29/20 0803  BP: 138/60 (!) 145/62 (!) 138/55 (!) 125/48  Pulse: 75 70 64 62  Resp: '18  19 18  '$ Temp: 98.2 F (36.8 C) 97.8 F (36.6 C) 97.9 F (36.6 C) 97.7 F (36.5 C)  TempSrc: Oral Oral    SpO2: 94% 92% 95% 98%  Weight:   59.2 kg   Height:        Intake/Output Summary (Last 24 hours) at 11/29/2020 0806 Last data filed at 11/29/2020 0801 Gross per 24 hour  Intake 240 ml  Output 1200 ml  Net -960 ml   Filed Weights   11/27/20 0415 11/28/20 0431 11/29/20 0434  Weight: 67.3 kg 61.8 kg 59.2 kg    Physical Exam    GEN: Well nourished, well developed, in no acute distress.  HEENT: normal.  Neck: Supple, no JVD, carotid bruits, or masses. Cardiac: RRR, [id number] systolic murmur  Respiratory:  Respirations regular and unlabored, clear to auscultation bilaterally. GI: Soft, nontender, nondistended, BS + x 4. MS: no deformity or atrophy. Skin: warm and dry, no rash. Neuro:  Strength and sensation are intact. Psych: Normal affect.  Labs    CBC Recent Labs    11/27/20  [id number] 11/28/20 0515 11/29/20 0506  WBC 7.5 7.6 6.2  NEUTROABS 5.3  --   --   HGB 8.8* 8.9* 8.8*  HCT 27.8* 27.4* 27.6*  MCV 96.5 92.9 94.2  PLT 226 244 [id number]   Basic Metabolic Panel Recent Labs    11/28/20 0515 11/29/20 0506  NA 137 136  K 3.8 4.0  CL 100 97*  CO2 27 28  GLUCOSE 161* 125*  BUN 27* 46*  CREATININE 1.43* 2.05*  CALCIUM 9.6 9.4   Liver Function Tests No results for input(s): AST, ALT, ALKPHOS, BILITOT, PROT, ALBUMIN in the last 72 hours. No results for input(s): LIPASE, AMYLASE in the last 72 hours. Cardiac Enzymes No results for input(s): CKTOTAL, CKMB, CKMBINDEX, TROPONINI in the last  72 hours. BNP Recent Labs    11/27/20 0419  BNP 288.2*   D-Dimer Recent Labs    11/27/20 0419  DDIMER 1.17*   Hemoglobin A1C Recent Labs    11/27/20 0628  HGBA1C 5.6   Fasting Lipid Panel No results for input(s): CHOL, HDL, LDLCALC, TRIG, CHOLHDL, LDLDIRECT in the last 72 hours. Thyroid Function Tests No results for input(s): TSH, T4TOTAL, T3FREE, THYROIDAB in the last 72 hours.  Invalid input(s): FREET3  Telemetry    nsr  ECG    nsr  Radiology    CT Angio Chest PE W and/or Wo Contrast  Result Date: 11/27/2020 CLINICAL DATA:  Shortness of breath. EXAM: CT ANGIOGRAPHY CHEST WITH CONTRAST TECHNIQUE: Multidetector CT imaging of the chest was performed using the standard protocol during bolus administration of intravenous contrast. Multiplanar CT image reconstructions and MIPs were obtained to evaluate the vascular anatomy. CONTRAST:  58m OMNIPAQUE IOHEXOL 350 MG/ML SOLN COMPARISON:  None. FINDINGS: Cardiovascular: Satisfactory opacification of the pulmonary arteries to the segmental level. No evidence of pulmonary embolism. Mild cardiomegaly is noted. Coronary artery calcifications are noted. Atherosclerosis of thoracic aorta is noted without aneurysm or dissection. No pericardial effusion. Mediastinum/Nodes: Thyroid gland and esophagus are unremarkable. Calcified subcarinal and left hilar adenopathy is noted consistent with prior granulomatous disease. However, noncalcified adenopathy is noted in the precarinal area measuring 14 mm and in the AP window measuring 13 mm. Lungs/Pleura: Small bilateral pleural effusions are noted with adjacent subsegmental atelectasis of the lower lobes. No pneumothorax is noted. Interstitial densities are noted throughout both lungs concerning for pulmonary edema. Upper Abdomen: Calcified splenic granulomata are noted. Musculoskeletal: No chest wall abnormality. No acute or significant osseous findings. Review of the MIP images confirms the above  findings. IMPRESSION: 1. No definite evidence of pulmonary embolus. 2. Coronary artery calcifications are noted suggesting coronary artery disease. 3. Small bilateral pleural effusions are noted with adjacent subsegmental atelectasis of the lower lobes. 4. Interstitial densities are noted throughout both lungs concerning for pulmonary edema. 5. Enlarged noncalcified mediastinal adenopathy is noted which may be inflammatory or neoplastic in etiology. 6. Calcified subcarinal left hilar adenopathy is noted most likely due to prior granulomatous disease. Aortic Atherosclerosis (ICD10-I70.0). Electronically Signed   By: JMarijo ConceptionM.D.   On: 11/27/2020 08:09   [place]Venous Img Lower Unilateral Left (DVT)  Result Date: 11/27/2020 CLINICAL DATA:  Elevated D-dimer. EXAM: LEFT LOWER EXTREMITY VENOUS DOPPLER ULTRASOUND TECHNIQUE: Gray-scale sonography with compression, as well as color and duplex ultrasound, were performed to evaluate the deep venous system(s) from the level of the common femoral vein through the popliteal and proximal calf veins. COMPARISON:  None. FINDINGS: VENOUS Normal compressibility of the common femoral, superficial femoral, and popliteal veins,  as well as the visualized calf veins. Visualized portions of profunda femoral vein and great saphenous vein unremarkable. No filling defects to suggest DVT on grayscale or color Doppler imaging. Doppler waveforms show normal direction of venous flow, normal respiratory plasticity and response to augmentation. Limited views of the contralateral common femoral vein are unremarkable. OTHER None. IMPRESSION: Negative exam.  No evidence of DVT. Electronically Signed   By: Marcello Moores  Register   On: 11/27/2020 07:25   US Venous Img Lower Unilateral Right  Result Date: 11/27/2020 CLINICAL DATA:  Right lower extremity swelling. EXAM: RIGHT LOWER EXTREMITY VENOUS DOPPLER ULTRASOUND TECHNIQUE: Gray-scale sonography with compression, as well as color and duplex  ultrasound, were performed to evaluate the deep venous system(s) from the level of the common femoral vein through the popliteal and proximal calf veins. COMPARISON:  No prior. FINDINGS: VENOUS Normal compressibility of the common femoral, superficial femoral, and popliteal veins, as well as the visualized calf veins. Visualized portions of profunda femoral vein and great saphenous vein unremarkable. No filling defects to suggest DVT on grayscale or color Doppler imaging. Doppler waveforms show normal direction of venous flow, normal respiratory plasticity and response to augmentation. Limited views of the contralateral common femoral vein are unremarkable. OTHER None. IMPRESSION: Negative exam.  No evidence of DVT. Electronically Signed   By: Marcello Moores  Register   On: 11/27/2020 05:26   DG Chest Portable 1 View  Result Date: 11/27/2020 CLINICAL DATA:  Shortness of breath. EXAM: PORTABLE CHEST 1 VIEW COMPARISON:  No prior. FINDINGS: Cardiomegaly. Bilateral interstitial prominence. No pleural effusion or pneumothorax. Degenerative change thoracic spine. IMPRESSION: 1. Cardiomegaly. 2. Bilateral interstitial prominence. Findings suggest mild CHF. Pneumonitis cannot be excluded. Electronically Signed   By: Marcello Moores  Register   On: 11/27/2020 05:03   ECHOCARDIOGRAM COMPLETE  Result Date: 11/27/2020    ECHOCARDIOGRAM REPORT   Patient Name:   MILADY BETHARDS Date of Exam: 11/27/2020 Medical Rec #:  [id number]          Height:       61.0 in Accession #:    [id number]         Weight:       148.4 lb Date of Birth:  03/16/1928         BSA:          1.664 m Patient Age:    56 years           BP:           168/81 mmHg Patient Gender: F                  HR:           87 bpm. Exam Location:  [facility] Procedure: 2D Echo, Color Doppler and Cardiac Doppler Indications:     I50.31 CHF-Acute Diastolic  History:         Patient has no prior history of Echocardiogram examinations.                  Risk Factors:Hypertension and  Diabetes.  Sonographer:     Charmayne Sheer RDCS (AE) Referring Phys:  [id number] Arvella Merles MANSY Diagnosing Phys: Bartholome Bill MD IMPRESSIONS  1. Left ventricular ejection fraction, by estimation, is 60 to 65%. The left ventricle has normal function. The left ventricle has no regional wall motion abnormalities. There is mild left ventricular hypertrophy. Left ventricular diastolic parameters are consistent with Grade I diastolic dysfunction (impaired relaxation).  2. Right ventricular systolic function is normal. The  right ventricular size is mildly enlarged.  3. The mitral valve is grossly normal. Trivial mitral valve regurgitation.  4. The aortic valve is calcified. Aortic valve regurgitation is trivial. Moderate aortic valve stenosis. FINDINGS  Left Ventricle: Left ventricular ejection fraction, by estimation, is 60 to 65%. The left ventricle has normal function. The left ventricle has no regional wall motion abnormalities. The left ventricular internal cavity size was normal in size. There is  mild left ventricular hypertrophy. Left ventricular diastolic parameters are consistent with Grade I diastolic dysfunction (impaired relaxation). Right Ventricle: The right ventricular size is mildly enlarged. No increase in right ventricular wall thickness. Right ventricular systolic function is normal. Left Atrium: Left atrial size was normal in size. Right Atrium: Right atrial size was normal in size. Pericardium: There is no evidence of pericardial effusion. Mitral Valve: The mitral valve is grossly normal. Trivial mitral valve regurgitation. MV peak gradient, 8.4 mmHg. The mean mitral valve gradient is 5.0 mmHg. Tricuspid Valve: The tricuspid valve is not well visualized. Tricuspid valve regurgitation is trivial. Aortic Valve: The aortic valve is calcified. Aortic valve regurgitation is trivial. Moderate aortic stenosis is present. Aortic valve mean gradient measures 32.0 mmHg. Aortic valve peak gradient measures 62.1 mmHg.  Aortic valve area, by VTI measures 1.05  cm. Pulmonic Valve: The pulmonic valve was not well visualized. Pulmonic valve regurgitation is trivial. Aorta: The aortic root is normal in size and structure. IAS/Shunts: The interatrial septum was not well visualized.  LEFT VENTRICLE PLAX 2D LVIDd:         4.70 cm  Diastology LVIDs:         2.50 cm  LV e' medial:    6.96 cm/s LV PW:         1.00 cm  LV E/e' medial:  19.8 LV IVS:        0.70 cm  LV e' lateral:   7.40 cm/s LVOT diam:     2.00 cm  LV E/e' lateral: 18.6 LV SV:         83 LV SV Index:   50 LVOT Area:     3.14 cm  RIGHT VENTRICLE RV Basal diam:  3.20 cm LEFT ATRIUM             Index       RIGHT ATRIUM           Index LA diam:        3.90 cm 2.34 cm/m  RA Area:     14.10 cm LA Vol (A2C):   55.4 ml 33.30 ml/m RA Volume:   33.80 ml  20.32 ml/m LA Vol (A4C):   87.4 ml 52.54 ml/m LA Biplane Vol: 70.2 ml 42.20 ml/m  AORTIC VALVE                    PULMONIC VALVE AV Area (Vmax):    1.01 cm     PV Vmax:       1.08 m/s AV Area (Vmean):   1.08 cm     PV Vmean:      66.200 cm/s AV Area (VTI):     1.05 cm     PV VTI:        0.222 m AV Vmax:           394.00 cm/s  PV Peak grad:  4.7 mmHg AV Vmean:          258.000 cm/s PV Mean grad:  2.0 mmHg AV VTI:  0.793 m AV Peak Grad:      62.1 mmHg AV Mean Grad:      32.0 mmHg LVOT Vmax:         127.00 cm/s LVOT Vmean:        88.500 cm/s LVOT VTI:          0.264 m LVOT/AV VTI ratio: 0.33  AORTA Ao Root diam: 2.90 cm MITRAL VALVE MV Area (PHT): 6.65 cm     SHUNTS MV Area VTI:   2.69 cm     Systemic VTI:  0.26 m MV Peak grad:  8.4 mmHg     Systemic Diam: 2.00 cm MV Mean grad:  5.0 mmHg MV Vmax:       1.45 m/s MV Vmean:      110.0 cm/s MV Decel Time: 114 msec MV E velocity: 138.00 cm/s MV A velocity: 143.00 cm/s MV E/A ratio:  0.97 Bartholome Bill MD Electronically signed by Bartholome Bill MD Signature Date/Time: 11/27/2020/10:41:42 AM    Final     Assessment & Plan     87 y.o.femalewith history ofdiabetes,  hypertension, restless leg syndrome who was brought to the emergency room when she had an acute episode of dyspnea that woke her from sleep early this morning associated with orthopnea. She had worsening lower extremity edema and mild dyspnea on exertion recently. She denies any cough wheezing or chest pain. In the emergency room her blood gas revealed a pH of 7.36 with a PO2 of 109. She was had a sat of 90% on BiPAP. Her creatinine was 1.4 BUN of 22 with a brain natruretic peptide of 288.2 and a high-sensitivity of troponin at 17. Her D-dimer was 1.17. She was anemic with a hemoglobin of 8.8. EKG showed sinus rhythm with nonspecific ST-T wave changes. Chest x-ray revealed bilateral interstitial prominence consistent with possible mild congestive heart failure. She was given IV Lasix and a nebulizing treatment with IV steroids. Chest CT to rule out pulmonary embolus has been ordered and is still pending. Cardiology consultation was requested due to congestive heart failure. Per chart she is treated with amlodipine 10 mg daily, lisinopril hydrochlorothiazide 10-12.5 mg daily, oral antihyperglycemic drugs. She is also on rosuvastatin at 5 mg daily. She has been continued with her amlodipine and started on IV Lasix lisinopril hydrochlorothiazide being held. She is hemodynamically stable. Lower extremity Doppler revealed no DVT.  1. Heart failure-has become volume overload on chest x-ray. Echo showed preserved LV function with at least moderate aortic stenosis. Etiology of heart failure may be multifactorial including diastolic as well as her aortic valvular disease. Aortic valve disease does not appear to be critical. Would likely do this as an outpatient. Has diuresed approximately 1 liter since admission. Still requiring nasal cannula oxygen.  Patient is anxious to go home. Creatinine a little higher.Will need to back down on diuresis..  Will need to closely follow renal function.  Somewhat  soft blood pressure but stable.  Pulse ox 98% on 2 L.Will need to determine if she will need oxygen at least temporarily as outpatient. Ambulate this am to determine stability and for timing of discharge. Will need close outpatient follow up   2. Diabetes mellitus-continue with current regimen  3. Hypertension-continue to follow hemodynamics while diuresing. Avoid afterload reduction due to aortic stenosis.  Pressure somewhat soft.  4.  Aortic stenosis-moderate.  Does not appear critical. Will need work up as outpatient.   Signed, Javier Docker Emmery Seiler MD 11/29/2020, 8:06 AM  Pager: [phone]

## 2020-11-29 NOTE — Progress Notes (Signed)
SATURATION QUALIFICATIONS: (This note is used to comply with regulatory documentation for home oxygen)  Patient Saturations on Room Air at Rest = 95%  Patient Saturations on Room Air while Ambulating = 90%   

## 2020-11-29 NOTE — Progress Notes (Signed)
   Heart Failure Nurse Navigator Note  HFpEF 60-65 %.  Moderate aortic stenosis.  Spoke with patient today, reinforcing signs and symptoms to report to physician such as weight gain of 2 to 3 pounds overnight or 5 pounds within a week.  Increasing shortness of breath or swelling.    Also talked about low-sodium diet.  She verbalized that she had eaten some wonton soup prior to coming into the hospital and questions if that is not what tipped her over the edge.  She states that she will not do that again.  She does not use salt at the table and will continue that practice.  She has appointment to follow-up with Dr. Ubaldo Glassing on May 3   and  heart failure clinic for May 6 at 2 PM.  She will see her family doctor on April 27.  Davaughn Hillyard RN [facility]

## 2020-11-29 NOTE — Progress Notes (Signed)
Mobility Specialist - Progress Note   11/29/20 1300  Mobility  Activity Ambulated in hall  Level of Assistance Independent  Assistive Device None  Distance Ambulated (ft) 480 ft  Mobility Response Tolerated well  Mobility performed by Mobility specialist  $Mobility charge 1 Mobility    O2 while Resting 2L:   97% O2 while Resting RA:  95% O2 while AMB on RA:  90%   Pt agreed to trial pulse ox during ambulation, sats recorded above. Pt denied SOB throughout session. PLB engaged during activity. Independent. No AD. Noted O2 desat to a low of 88% at the end of ambulation, still denying SOB. Pt's O2 sat at 95-98% prior to exit. [place] doffed but left on 2L and within reach if needed, pt voiced understanding.    Kathee Delton Mobility Specialist 11/29/20, 1:07 PM

## 2020-11-29 NOTE — Progress Notes (Addendum)
Discharge instructions (including medications and follow up appointments) discussed with patient and copy provided to patient/caregiver  Encourage patient to monitor O2 levels if possible, call MD if worsening SOB, and monitor weight daily.  Take adequate rest breaks if she starts to get SOB

## 2020-12-03 ENCOUNTER — Telehealth: Payer: Self-pay

## 2020-12-03 NOTE — Telephone Encounter (Signed)
  Post hospital discharge call  Spoke with the patient, she feels that she is doing well.  She continues to weigh her self daily and has not noted a weight gain.  She denied any increasing shortness of breath, PND or orthopnea.  She is awaiting to see her PCP on Wednesday at 8:45 hoping that he will release her to go back to her job.  Discussed her medications.  She states that her kids have set up medication boxes for her.  She could not find her discharge instruction sheet to discuss the medications.  Discussed her follow-up with the heart failure clinic on May 6 at [place] [facility]

## 2020-12-05 ENCOUNTER — Ambulatory Visit: Payer: Medicare HMO | Admitting: Family

## 2020-12-05 DIAGNOSIS — N179 Acute kidney failure, unspecified: Secondary | ICD-10-CM | POA: Diagnosis not present

## 2020-12-05 DIAGNOSIS — E1122 Type 2 diabetes mellitus with diabetic chronic kidney disease: Secondary | ICD-10-CM | POA: Diagnosis not present

## 2020-12-05 DIAGNOSIS — E782 Mixed hyperlipidemia: Secondary | ICD-10-CM | POA: Diagnosis not present

## 2020-12-05 DIAGNOSIS — N189 Chronic kidney disease, unspecified: Secondary | ICD-10-CM | POA: Diagnosis not present

## 2020-12-05 DIAGNOSIS — I5032 Chronic diastolic (congestive) heart failure: Secondary | ICD-10-CM | POA: Insufficient documentation

## 2020-12-05 DIAGNOSIS — I130 Hypertensive heart and chronic kidney disease with heart failure and stage 1 through stage 4 chronic kidney disease, or unspecified chronic kidney disease: Secondary | ICD-10-CM | POA: Diagnosis not present

## 2020-12-13 DIAGNOSIS — E119 Type 2 diabetes mellitus without complications: Secondary | ICD-10-CM | POA: Diagnosis not present

## 2020-12-13 DIAGNOSIS — E782 Mixed hyperlipidemia: Secondary | ICD-10-CM | POA: Diagnosis not present

## 2020-12-13 DIAGNOSIS — I350 Nonrheumatic aortic (valve) stenosis: Secondary | ICD-10-CM | POA: Diagnosis not present

## 2020-12-13 DIAGNOSIS — K219 Gastro-esophageal reflux disease without esophagitis: Secondary | ICD-10-CM | POA: Diagnosis not present

## 2020-12-13 DIAGNOSIS — I5032 Chronic diastolic (congestive) heart failure: Secondary | ICD-10-CM | POA: Diagnosis not present

## 2020-12-13 DIAGNOSIS — I10 Essential (primary) hypertension: Secondary | ICD-10-CM | POA: Diagnosis not present

## 2020-12-13 DIAGNOSIS — N183 Chronic kidney disease, stage 3 unspecified: Secondary | ICD-10-CM | POA: Diagnosis not present

## 2020-12-14 ENCOUNTER — Ambulatory Visit: Payer: Medicare HMO | Attending: Family | Admitting: Family

## 2020-12-14 ENCOUNTER — Other Ambulatory Visit: Payer: Self-pay

## 2020-12-14 ENCOUNTER — Encounter: Payer: Self-pay | Admitting: Family

## 2020-12-14 VITALS — BP 112/47 | HR 85 | Resp 18 | Ht 61.0 in | Wt 138.0 lb

## 2020-12-14 DIAGNOSIS — E1122 Type 2 diabetes mellitus with diabetic chronic kidney disease: Secondary | ICD-10-CM

## 2020-12-14 DIAGNOSIS — Z7984 Long term (current) use of oral hypoglycemic drugs: Secondary | ICD-10-CM | POA: Insufficient documentation

## 2020-12-14 DIAGNOSIS — I110 Hypertensive heart disease with heart failure: Secondary | ICD-10-CM | POA: Insufficient documentation

## 2020-12-14 DIAGNOSIS — I509 Heart failure, unspecified: Secondary | ICD-10-CM | POA: Insufficient documentation

## 2020-12-14 DIAGNOSIS — Z87891 Personal history of nicotine dependence: Secondary | ICD-10-CM | POA: Insufficient documentation

## 2020-12-14 DIAGNOSIS — E119 Type 2 diabetes mellitus without complications: Secondary | ICD-10-CM | POA: Diagnosis not present

## 2020-12-14 DIAGNOSIS — I5032 Chronic diastolic (congestive) heart failure: Secondary | ICD-10-CM

## 2020-12-14 DIAGNOSIS — I10 Essential (primary) hypertension: Secondary | ICD-10-CM

## 2020-12-14 DIAGNOSIS — N184 Chronic kidney disease, stage 4 (severe): Secondary | ICD-10-CM

## 2020-12-14 NOTE — Progress Notes (Signed)
[facility] - PHARMACIST COUNSELING NOTE  ADHERENCE ASSESSMENT  Adherence strategy: daughter-in-law helps keep pill box updated   Do you ever forget to take your medication? '[]'$ Yes (1) '[x]'$ No (0)  Do you ever skip doses due to side effects? '[]'$ Yes (1) '[x]'$ No (0)  Do you have trouble affording your medicines? '[]'$ Yes (1) '[x]'$ No (0)  Are you ever unable to pick up your medication due to transportation difficulties? '[]'$ Yes (1) '[x]'$ No (0)  Do you ever stop taking your medications because you don't believe they are helping? '[]'$ Yes (1) '[x]'$ No (0)  Total score _0______    Recommendations given to patient about increasing adherence: Pt is adherent with current regimen  Guideline-Directed Medical Therapy/Evidence Based Medicine  ACE/ARB/ARNI: lisinopril  Beta Blocker: N/A Aldosterone Antagonist: N/A Diuretic: furosemide    SUBJECTIVE  HPI:  Past Medical History:  Diagnosis Date  . Diabetes mellitus without complication ([place])   . GERD (gastroesophageal reflux disease)   . Hypertension   . Insomnia   . Iron deficiency anemia   . RLS (restless legs syndrome)         OBJECTIVE   Vital signs: HR 85, BP 112/47, weight (pounds) 138 ECHO: Date 11/27/20, EF 60-65%, notes mild LVH, LA normal in size  BMP Latest Ref Rng & Units 11/29/2020 11/28/2020 11/27/2020  Glucose 70 - 99 mg/dL 125(H) 161(H) 186(H)  BUN 8 - 23 mg/dL 46(H) 27(H) 22  Creatinine 0.44 - 1.00 mg/dL 2.05(H) 1.43(H) 1.40(H)  Sodium 135 - 145 mmol/L 136 137 139  Potassium 3.5 - 5.1 mmol/L 4.0 3.8 3.9  Chloride 98 - 111 mmol/L 97(L) 100 106  CO2 22 - 32 mmol/L '28 27 24  '$ Calcium 8.9 - 10.3 mg/dL 9.4 9.6 9.4    ASSESSMENT 87 yo F presenting to heart failure clinic for new patient visit. PMH includes restless leg syndrome, HTN, anemia, GERD, and diabetes. No barriers to adherence identified during medication reconciliation. Pts family helps keep up with pill box.   PLAN CHF/HTN -  Continue amlodipine 10 mg daily  - Continue lisinopril-hydrochlorothiazide 20-12.5 mg daily   - Scr elevated to 2.05 on 4/21 (baseline ~1.1). Labs scheduled with [facility] in next  couple of weeks. - Continue furosemide 20 mg daily  - Consider switching to Entresto and adding empagliflozin at future visit if renal function improved   Diabetes - Continue metformin 500 mg twice daily  - Continue rosuvastatin 5 mg daily   Anemia - Continue ferrous sulfate 325 mg daily   Pain - Continue gabapentin 100 mg at bedtime  Sleep - Continue trazodone 50 mg daily at bedtime  Restless leg syndrome - Continue pramipexole 0.25 mg twice daily  General Health  - Continue multivitamin daily  - Continue calcium + vitamin D supplement daily   GERD - Continue omeprazole 20 mg twice daily   Time spent: 15 minutes  Benn Moulder, PharmD Pharmacy Resident  12/14/2020 1:36 PM   Current Outpatient Medications:  .  amLODipine (NORVASC) 10 MG tablet, Take 10 mg by mouth daily., Disp: , Rfl:  .  Calcium Carbonate-Vit D-Min (CALTRATE 600+D PLUS MINERALS) 600-800 MG-UNIT CHEW, Chew 1 tablet by mouth as directed., Disp: , Rfl:  .  ferrous sulfate 325 (65 FE) MG tablet, Take 325 mg by mouth daily., Disp: , Rfl:  .  furosemide (LASIX) 20 MG tablet, Take 1 tablet (20 mg total) by mouth daily., Disp: 30 tablet, Rfl: 0 .  gabapentin (NEURONTIN) 100 MG capsule, Take  100 mg by mouth at bedtime., Disp: , Rfl:  .  lisinopril-hydrochlorothiazide (ZESTORETIC) 20-12.5 MG tablet, Take 1 tablet by mouth daily., Disp: , Rfl:  .  metFORMIN (GLUCOPHAGE) 500 MG tablet, Take 500 mg by mouth 2 (two) times daily with a meal., Disp: , Rfl:  .  Multiple Vitamins-Minerals (MULTIVITAMIN WITH MINERALS) tablet, Take 1 tablet by mouth daily., Disp: , Rfl:  .  omeprazole (PRILOSEC) 20 MG capsule, Take 20 mg by mouth 2 (two) times daily., Disp: , Rfl:  .  pramipexole (MIRAPEX) 0.25 MG tablet, Take 0.25 mg by mouth in the morning and  at bedtime., Disp: , Rfl:  .  rosuvastatin (CRESTOR) 5 MG tablet, Take 5 mg by mouth at bedtime., Disp: , Rfl:  .  traZODone (DESYREL) 50 MG tablet, Take 50 mg by mouth at bedtime., Disp: , Rfl:    COUNSELING POINTS/CLINICAL PEARLS  Lisinopril (Goal: 20 to 40 mg once daily)  This drug may cause nausea, vomiting, dizziness, headache, or angioedema of face, lips, throat, or intestines.  Instruct patient to report signs/symptoms of hypotension, or a persistent cough.  Advise patient against sudden discontinuation of drug. Furosemide  Drug causes sun-sensitivity. Advise patient to use sunscreen and avoid tanning beds. Patient should avoid activities requiring coordination until drug effects are realized, as drug may cause dizziness, vertigo, or blurred vision. This drug may cause hyperglycemia, hyperuricemia, constipation, diarrhea, loss of appetite, nausea, vomiting, purpuric disorder, cramps, spasticity, asthenia, headache, paresthesia, or scaling eczema. Instruct patient to report unusual bleeding/bruising or signs/symptoms of hypotension, infection, pancreatitis, or ototoxicity (tinnitus, hearing impairment). Advise patient to report signs/symptoms of a severe skin reactions (flu-like symptoms, spreading red rash, or skin/mucous membrane blistering) or erythema multiforme. Instruct patient to eat high-potassium foods during drug therapy, as directed by healthcare professional.  Patient should not drink alcohol while taking this drug.  DRUGS TO AVOID IN HEART FAILURE  Drug or Class Mechanism  Analgesics . NSAIDs . COX-2 inhibitors . Glucocorticoids  Sodium and water retention, increased systemic vascular resistance, decreased response to diuretics   Diabetes Medications . Metformin . Thiazolidinediones o Rosiglitazone (Avandia) o Pioglitazone (Actos) . DPP4 Inhibitors o Saxagliptin (Onglyza) o Sitagliptin (Januvia)   Lactic acidosis Possible calcium channel blockade   Unknown   Antiarrhythmics . Class I  o Flecainide o Disopyramide . Class III o Sotalol . Other o Dronedarone  Negative inotrope, proarrhythmic   Proarrhythmic, beta blockade  Negative inotrope  Antihypertensives . Alpha Blockers o Doxazosin . Calcium Channel Blockers o Diltiazem o Verapamil o Nifedipine . Central Alpha Adrenergics o Moxonidine . Peripheral Vasodilators o Minoxidil  Increases renin and aldosterone  Negative inotrope    Possible sympathetic withdrawal  Unknown  Anti-infective . Itraconazole . Amphotericin B  Negative inotrope Unknown  Hematologic . Anagrelide . Cilostazol   Possible inhibition of PD IV Inhibition of PD III causing arrhythmias  Neurologic/Psychiatric . Stimulants . Anti-Seizure Drugs o Carbamazepine o Pregabalin . Antidepressants o Tricyclics o Citalopram . Parkinsons o Bromocriptine o Pergolide o Pramipexole . Antipsychotics o Clozapine . Antimigraine o Ergotamine o Methysergide . Appetite suppressants . Bipolar o Lithium  Peripheral alpha and beta agonist activity  Negative inotrope and chronotrope Calcium channel blockade  Negative inotrope, proarrhythmic Dose-dependent QT prolongation  Excessive serotonin activity/valvular damage Excessive serotonin activity/valvular damage Unknown  IgE mediated hypersensitivy, calcium channel blockade  Excessive serotonin activity/valvular damage Excessive serotonin activity/valvular damage Valvular damage  Direct myofibrillar degeneration, adrenergic stimulation  Antimalarials . Chloroquine . Hydroxychloroquine Intracellular inhibition of lysosomal  enzymes  Urologic Agents . Alpha Blockers o Doxazosin o Prazosin o Tamsulosin o Terazosin  Increased renin and aldosterone  Adapted from Page RL, et al. "Drugs That May Cause or Exacerbate Heart Failure: A Scientific Statement from the [place]." Circulation 2016; [id number]. DOI:  10.1161/CIR.0000000000000426   MEDICATION ADHERENCES TIPS AND STRATEGIES 1. Taking medication as prescribed improves patient outcomes in heart failure (reduces hospitalizations, improves symptoms, increases survival) 2. Side effects of medications can be managed by decreasing doses, switching agents, stopping drugs, or adding additional therapy. Please let someone in the [facility] know if you have having bothersome side effects so we can modify your regimen. Do not alter your medication regimen without talking to [place].  3. Medication reminders can help patients remember to take drugs on time. If you are missing or forgetting doses you can try linking behaviors, using pill boxes, or an electronic reminder like an alarm on your phone or an app. Some people can also get automated phone calls as medication reminders.

## 2020-12-14 NOTE — Progress Notes (Signed)
Patient ID: Lisa Mcneil, female    DOB: 05/29/1928, 87 y.o.   MRN: [id number]  HPI  Lisa Mcneil is a 87 y/o female with a history of DM, HTN, anemia, GERD and chronic heart failure.   Echo report from 11/27/20 reviewed and showed an EF of 60-65% along with mild LVH, trivial MR and moderate AS.   Admitted 11/27/20 due to shortness of breath and pedal edema.    Cardiology consult obtained. Given nebulizer, IV lasix and solu-medrol. Able to be weaned off of oxygen. Discharged after 2 days.   She presents today with a chief complaint of an initial visit. She says that she feels "great" and doesn't endorse any symptoms. She specifically denies any difficulty sleeping, dizziness, chest pain, abdominal distention, cough, pedal edema, fatigue or weight gain.   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  Past Medical History:  Diagnosis Date  . CHF (congestive heart failure) ([place])   . Diabetes mellitus without complication ([place])   . GERD (gastroesophageal reflux disease)   . Hypertension   . Insomnia   . Iron deficiency anemia   . RLS (restless legs syndrome)    Past Surgical History:  Procedure Laterality Date  . BLADDER REPAIR    . CATARACT EXTRACTION W/PHACO Right 05/13/2016   Procedure: CATARACT EXTRACTION PHACO AND INTRAOCULAR LENS PLACEMENT (IOC);  Surgeon: Birder Robson, MD;  Location: [facility] ORS;  Service: Ophthalmology;  Laterality: Right;  [place] 1.09 AP% 21.0 CDE 14.60 Fluid Pack Lot # [id number]  . CATARACT EXTRACTION W/PHACO Left 07/15/2016   Procedure: CATARACT EXTRACTION PHACO AND INTRAOCULAR LENS PLACEMENT (IOC);  Surgeon: Birder Robson, MD;  Location: [facility] ORS;  Service: Ophthalmology;  Laterality: Left;  Lot# [id number] H [place]: 01:17.9 AP%:27.8 CDE: 21.67    History reviewed. No pertinent family history. Social History   Tobacco Use  . Smoking status: Former Research scientist (life sciences)  . Smokeless tobacco: Never Used  Substance Use Topics  . Alcohol use: No    Allergies  Allergen Reactions  . Codeine   . Demerol [Meperidine]   . Polysaccharide Iron Complex Other (See Comments)    Caused constipation  . Naproxen Rash    Caused rash on mouth   Prior to Admission medications   Medication Sig Start Date End Date Taking? Authorizing Provider  amLODipine (NORVASC) 10 MG tablet Take 10 mg by mouth daily.   Yes [provider]  Calcium Carbonate-Vit D-Min (CALTRATE 600+D PLUS MINERALS) 600-800 MG-UNIT CHEW Chew 1 tablet by mouth as directed.   Yes [provider]  ferrous sulfate 325 (65 FE) MG tablet Take 325 mg by mouth daily.   Yes [provider]  furosemide (LASIX) 20 MG tablet Take 1 tablet (20 mg total) by mouth daily. 11/30/20 12/30/20 Yes Wyvonnia Dusky, MD  gabapentin (NEURONTIN) 100 MG capsule Take 100 mg by mouth at bedtime.   Yes [provider]  lisinopril-hydrochlorothiazide (ZESTORETIC) 20-12.5 MG tablet Take 1 tablet by mouth daily.   Yes [provider]  metFORMIN (GLUCOPHAGE) 500 MG tablet Take 500 mg by mouth 2 (two) times daily with a meal.   Yes [provider]  Multiple Vitamins-Minerals (MULTIVITAMIN WITH MINERALS) tablet Take 1 tablet by mouth daily.   Yes [provider]  omeprazole (PRILOSEC) 20 MG capsule Take 20 mg by mouth 2 (two) times daily.   Yes [provider]  pramipexole (MIRAPEX) 0.25 MG tablet Take 0.25 mg by mouth in the morning and at bedtime.  Yes [provider]  rosuvastatin (CRESTOR) 5 MG tablet Take 5 mg by mouth at bedtime.   Yes [provider]  traZODone (DESYREL) 50 MG tablet Take 50 mg by mouth at bedtime.   Yes [provider]   Review of Systems  Constitutional: Negative for appetite change and fatigue.  HENT: Negative for congestion, postnasal drip and sore throat.   Eyes: Negative.   Respiratory: Negative for cough and shortness of breath.   Cardiovascular: Negative for chest pain,  palpitations and leg swelling.  Gastrointestinal: Negative for abdominal distention and abdominal pain.  Endocrine: Negative.   Genitourinary: Negative.   Musculoskeletal: Negative for back pain and neck pain.  Skin: Negative.   Allergic/Immunologic: Negative.   Neurological: Negative for dizziness and light-headedness.  Hematological: Negative for adenopathy. Does not bruise/bleed easily.  Psychiatric/Behavioral: Negative for dysphoric mood and sleep disturbance. The patient is not nervous/anxious.    Vitals:   12/14/20 1340  BP: (!) 112/47  Pulse: 85  Resp: 18  SpO2: 97%  Weight: 138 lb (62.6 kg)  Height: '5\' 1"'$  (1.549 m)   Wt Readings from Last 3 Encounters:  12/14/20 138 lb (62.6 kg)  11/29/20 130 lb 9.6 oz (59.2 kg)  10/18/20 141 lb 3.2 oz (64 kg)   Lab Results  Component Value Date   CREATININE 2.05 (H) 11/29/2020   CREATININE 1.43 (H) 11/28/2020   CREATININE 1.40 (H) 11/27/2020    Physical Exam Vitals and nursing note reviewed.  Constitutional:      Appearance: Normal appearance.  HENT:     Head: Normocephalic and atraumatic.  Cardiovascular:     Rate and Rhythm: Normal rate and regular rhythm.  Pulmonary:     Effort: Pulmonary effort is normal. No respiratory distress.     Breath sounds: No wheezing or rales.  Abdominal:     General: There is no distension.     Palpations: Abdomen is soft.  Musculoskeletal:        General: No tenderness.     Cervical back: Normal range of motion and neck supple.     Right lower leg: No edema.     Left lower leg: No edema.  Skin:    General: Skin is warm and dry.  Neurological:     General: No focal deficit present.     Mental Status: She is alert and oriented to person, place, and time.  Psychiatric:        Mood and Affect: Mood normal.        Behavior: Behavior normal.        Thought Content: Thought content normal.    Assessment & Plan:  1: Chronic heart failure with preserved ejection fraction with structural  changes (LVH)- - NYHA class I - euvolemic today - weighing daily and says that her weight has been stable; reminded to call for an overnight weight gain of > 2 pounds or a weekly weight gain of > 5 pounds - not adding salt and has been diligent in reading food labels for sodium content - saw cardiology ([place]) 12/13/20 - BNP 11/27/20 was 288.2 - PharmD reconciled medications with the patient  2: HTN- - BP looks good today - saw PCP ([place]) 12/05/20 - BMP 11/29/20 reviewed and showed sodium 136, potassium 4.0, creatinine 2.05 and GFR 23  3: DM- - A1c 11/27/20 was 5.6% - glucose at home this morning was 109  Medication list reviewed.   Due to HF stability will not make a return appointment at  this time. Advised patient that she could call back at anytime to schedule another appointment or for any questions. She was comfortable with this plan.

## 2020-12-14 NOTE — Patient Instructions (Addendum)
Continue weighing daily and call for an overnight weight gain of > 2 pounds or a weekly weight gain of >5 pounds.   Call us in the future if you'd like to schedule another appointment 

## 2020-12-31 DIAGNOSIS — I5032 Chronic diastolic (congestive) heart failure: Secondary | ICD-10-CM | POA: Diagnosis not present

## 2020-12-31 DIAGNOSIS — E782 Mixed hyperlipidemia: Secondary | ICD-10-CM | POA: Diagnosis not present

## 2020-12-31 DIAGNOSIS — I10 Essential (primary) hypertension: Secondary | ICD-10-CM | POA: Diagnosis not present

## 2021-01-02 ENCOUNTER — Emergency Department
Admission: EM | Admit: 2021-01-02 | Discharge: 2021-01-02 | Disposition: A | Discharge status: Home / Self Care | Payer: Medicare HMO | Attending: Emergency Medicine | Admitting: Emergency Medicine

## 2021-01-02 ENCOUNTER — Encounter: Payer: Self-pay | Admitting: Emergency Medicine

## 2021-01-02 ENCOUNTER — Other Ambulatory Visit: Payer: Self-pay

## 2021-01-02 DIAGNOSIS — R5383 Other fatigue: Secondary | ICD-10-CM | POA: Diagnosis not present

## 2021-01-02 DIAGNOSIS — R9431 Abnormal electrocardiogram [ECG] [EKG]: Secondary | ICD-10-CM | POA: Diagnosis not present

## 2021-01-02 DIAGNOSIS — E1122 Type 2 diabetes mellitus with diabetic chronic kidney disease: Secondary | ICD-10-CM | POA: Insufficient documentation

## 2021-01-02 DIAGNOSIS — Z79899 Other long term (current) drug therapy: Secondary | ICD-10-CM | POA: Diagnosis not present

## 2021-01-02 DIAGNOSIS — D649 Anemia, unspecified: Secondary | ICD-10-CM | POA: Insufficient documentation

## 2021-01-02 DIAGNOSIS — I130 Hypertensive heart and chronic kidney disease with heart failure and stage 1 through stage 4 chronic kidney disease, or unspecified chronic kidney disease: Secondary | ICD-10-CM | POA: Insufficient documentation

## 2021-01-02 DIAGNOSIS — E611 Iron deficiency: Secondary | ICD-10-CM | POA: Diagnosis not present

## 2021-01-02 DIAGNOSIS — Z87891 Personal history of nicotine dependence: Secondary | ICD-10-CM | POA: Insufficient documentation

## 2021-01-02 DIAGNOSIS — Z7984 Long term (current) use of oral hypoglycemic drugs: Secondary | ICD-10-CM | POA: Insufficient documentation

## 2021-01-02 DIAGNOSIS — I509 Heart failure, unspecified: Secondary | ICD-10-CM | POA: Diagnosis not present

## 2021-01-02 DIAGNOSIS — N183 Chronic kidney disease, stage 3 unspecified: Secondary | ICD-10-CM | POA: Insufficient documentation

## 2021-01-02 DIAGNOSIS — E875 Hyperkalemia: Secondary | ICD-10-CM | POA: Insufficient documentation

## 2021-01-02 DIAGNOSIS — R531 Weakness: Secondary | ICD-10-CM | POA: Diagnosis present

## 2021-01-02 LAB — URINALYSIS, COMPLETE (UACMP) WITH MICROSCOPIC
Bilirubin Urine: NEGATIVE
Glucose, UA: NEGATIVE mg/dL
Hgb urine dipstick: NEGATIVE
Ketones, ur: NEGATIVE mg/dL
Nitrite: POSITIVE — AB
Protein, ur: NEGATIVE mg/dL
Specific Gravity, Urine: 1.013 (ref 1.005–1.030)
WBC, UA: >50 WBC/hpf — ABNORMAL HIGH (ref 0–5)
pH: 5 (ref 5.0–8.0)

## 2021-01-02 LAB — CBC WITH DIFFERENTIAL/PLATELET
Abs Immature Granulocytes: 0.01 10*3/uL (ref 0.00–0.07)
Basophils Absolute: 0.1 10*3/uL (ref 0.0–0.1)
Basophils Relative: 1 %
Eosinophils Absolute: 0.3 10*3/uL (ref 0.0–0.5)
Eosinophils Relative: 7 %
HCT: 26.7 % — ABNORMAL LOW (ref 36.0–46.0)
Hemoglobin: 8.6 g/dL — ABNORMAL LOW (ref 12.0–15.0)
Immature Granulocytes: 0 %
Lymphocytes Relative: 24 %
Lymphs Abs: 1.2 10*3/uL (ref 0.7–4.0)
MCH: 30.3 pg (ref 26.0–34.0)
MCHC: 32.2 g/dL (ref 30.0–36.0)
MCV: 94 fL (ref 80.0–100.0)
Monocytes Absolute: 0.5 10*3/uL (ref 0.1–1.0)
Monocytes Relative: 9 %
Neutro Abs: 2.9 10*3/uL (ref 1.7–7.7)
Neutrophils Relative %: 59 %
Platelets: 202 10*3/uL (ref 150–400)
RBC: 2.84 MIL/uL — ABNORMAL LOW (ref 3.87–5.11)
RDW: 16.6 % — ABNORMAL HIGH (ref 11.5–15.5)
WBC: 4.9 10*3/uL (ref 4.0–10.5)
nRBC: 0 % (ref 0.0–0.2)

## 2021-01-02 LAB — PREPARE RBC (CROSSMATCH)

## 2021-01-02 LAB — BASIC METABOLIC PANEL
Anion gap: 10 (ref 5–15)
Anion gap: 9 (ref 5–15)
BUN: 38 mg/dL — ABNORMAL HIGH (ref 8–23)
BUN: 37 mg/dL — ABNORMAL HIGH (ref 8–23)
CO2: 23 mmol/L (ref 22–32)
CO2: 22 mmol/L (ref 22–32)
Calcium: 9.5 mg/dL (ref 8.9–10.3)
Calcium: 9.3 mg/dL (ref 8.9–10.3)
Chloride: 102 mmol/L (ref 98–111)
Chloride: 105 mmol/L (ref 98–111)
Creatinine, Ser: 1.85 mg/dL — ABNORMAL HIGH (ref 0.44–1.00)
Creatinine, Ser: 1.7 mg/dL — ABNORMAL HIGH (ref 0.44–1.00)
GFR, Estimated: 26 mL/min — ABNORMAL LOW (ref >60)
GFR, Estimated: 28 mL/min — ABNORMAL LOW (ref >60)
Glucose, Bld: 148 mg/dL — ABNORMAL HIGH (ref 70–99)
Glucose, Bld: 204 mg/dL — ABNORMAL HIGH (ref 70–99)
Potassium: 5.4 mmol/L — ABNORMAL HIGH (ref 3.5–5.1)
Potassium: 5.2 mmol/L — ABNORMAL HIGH (ref 3.5–5.1)
Sodium: 135 mmol/L (ref 135–145)
Sodium: 136 mmol/L (ref 135–145)

## 2021-01-02 LAB — CBC
HCT: 22.6 % — ABNORMAL LOW (ref 36.0–46.0)
Hemoglobin: 7.2 g/dL — ABNORMAL LOW (ref 12.0–15.0)
MCH: 30.3 pg (ref 26.0–34.0)
MCHC: 31.9 g/dL (ref 30.0–36.0)
MCV: 95 fL (ref 80.0–100.0)
Platelets: 195 10*3/uL (ref 150–400)
RBC: 2.38 MIL/uL — ABNORMAL LOW (ref 3.87–5.11)
RDW: 16.9 % — ABNORMAL HIGH (ref 11.5–15.5)
WBC: 5.3 10*3/uL (ref 4.0–10.5)
nRBC: 0 % (ref 0.0–0.2)

## 2021-01-02 MED ORDER — SODIUM CHLORIDE 0.9 % IV BOLUS
500.0000 mL | Freq: Once | INTRAVENOUS | Status: AC
  Administered 2021-01-02: 500 mL via INTRAVENOUS

## 2021-01-02 MED ORDER — SODIUM CHLORIDE 0.9 % IV SOLN
10.0000 mL/h | Freq: Once | INTRAVENOUS | Status: AC
  Administered 2021-01-02: 10 mL/h via INTRAVENOUS

## 2021-01-02 NOTE — ED Triage Notes (Signed)
Pt comes into the ED via [facility] c/o weakness and abnormal lab with Hgb at 7.  Pt has had increased weakness, SHOB, and regularly goes for iron infusions with the next one being scheduled on 01/12/20.  Pt states she doesn't think she can make it to the infusion due to increased symptoms.  Pt in NAD at this time with even and unlabored respirations.

## 2021-01-02 NOTE — ED Notes (Signed)
Lisa Mcneil from blood bank called. Pt meets criteria for 1 unit of blood, not 2, according to blood bank/lab values. EDP informed of what blood bank said.

## 2021-01-02 NOTE — ED Provider Notes (Signed)
[facility] Emergency Department Provider Note ____________________________________________   Event Date/Time   First MD Initiated Contact with Patient 01/02/21 1527     (approximate)  I have reviewed the triage vital signs and the nursing notes.   HISTORY  Chief Complaint Abnormal Lab and Weakness    HPI Lisa Mcneil is a 87 y.o. female with PMH as noted below including CHF, hypertension, diabetes, and iron deficiency anemia previously treated with iron infusions who presents with worsening generalized weakness over the last week, gradual onset, and not associated with shortness of breath, chest pain, dizziness, or other acute symptoms.  The patient states that she has a hematology appointment next month.  She states that she was last seen a few months ago and at that time was told that she did not need an iron infusion.  She denies any abnormal bleeding or bruising.  Past Medical History:  Diagnosis Date  . CHF (congestive heart failure) ([place])   . Diabetes mellitus without complication ([place])   . GERD (gastroesophageal reflux disease)   . Hypertension   . Insomnia   . Iron deficiency anemia   . RLS (restless legs syndrome)     Patient Active Problem List   Diagnosis Date Noted  . Acute CHF (congestive heart failure) ([place]) 11/27/2020  . Systolic murmur [id number]  . Localized osteoporosis without current pathological fracture 04/26/2019  . CKD (chronic kidney disease) stage 3, GFR 30-59 ml/min (HCC) 04/20/2018  . Diabetes mellitus without complication ([place]) [id number]  . Essential hypertension 11/05/2017  . Iron deficiency 11/05/2017  . Mixed hyperlipidemia 11/05/2017  . Primary insomnia 05/07/2017  . GERD without esophagitis 08/22/2015  . Iron deficiency anemia 02/16/2015    Past Surgical History:  Procedure Laterality Date  . BLADDER REPAIR    . CATARACT EXTRACTION W/PHACO Right 05/13/2016   Procedure: CATARACT EXTRACTION PHACO AND  INTRAOCULAR LENS PLACEMENT (IOC);  Surgeon: Birder Robson, MD;  Location: [facility] ORS;  Service: Ophthalmology;  Laterality: Right;  [place] 1.09 AP% 21.0 CDE 14.60 Fluid Pack Lot # [id number]  . CATARACT EXTRACTION W/PHACO Left 07/15/2016   Procedure: CATARACT EXTRACTION PHACO AND INTRAOCULAR LENS PLACEMENT (IOC);  Surgeon: Birder Robson, MD;  Location: [facility] ORS;  Service: Ophthalmology;  Laterality: Left;  Lot# [id number] H [place]: 01:17.9 AP%:27.8 CDE: 21.67     Prior to Admission medications   Medication Sig Start Date End Date Taking? Authorizing Provider  amLODipine (NORVASC) 10 MG tablet Take 10 mg by mouth daily.   Yes [provider]  Calcium Carbonate-Vit D-Min (CALTRATE 600+D PLUS MINERALS) 600-800 MG-UNIT CHEW Chew 1 tablet by mouth as directed.   Yes [provider]  ferrous sulfate 325 (65 FE) MG tablet Take 325 mg by mouth daily.   Yes [provider]  furosemide (LASIX) 20 MG tablet Take 20 mg by mouth daily as needed. 12/13/20  Yes [provider]  gabapentin (NEURONTIN) 100 MG capsule Take 100 mg by mouth at bedtime.   Yes [provider]  lisinopril-hydrochlorothiazide (ZESTORETIC) 20-12.5 MG tablet Take 1 tablet by mouth daily.   Yes [provider]  metFORMIN (GLUCOPHAGE) 500 MG tablet Take 500 mg by mouth 2 (two) times daily with a meal.   Yes [provider]  Multiple Vitamins-Minerals (MULTIVITAMIN WITH MINERALS) tablet Take 1 tablet by mouth daily.   Yes [provider]  omeprazole (PRILOSEC) 20 MG capsule Take 20 mg by mouth 2 (two) times daily.   Yes [provider]  pramipexole (MIRAPEX) 0.25  MG tablet Take 0.25 mg by mouth in the morning and at bedtime.   Yes [provider]  rosuvastatin (CRESTOR) 5 MG tablet Take 5 mg by mouth at bedtime.   Yes [provider]  traZODone (DESYREL) 50 MG tablet Take 50 mg by mouth at bedtime.   Yes [provider]  furosemide (LASIX) 20  MG tablet Take 1 tablet (20 mg total) by mouth daily. 11/30/20 12/30/20  Wyvonnia Dusky, MD    Allergies Codeine, Demerol [meperidine], Polysaccharide iron complex, and Naproxen  History reviewed. No pertinent family history.  Social History Social History   Tobacco Use  . Smoking status: Former Research scientist (life sciences)  . Smokeless tobacco: Never Used  Vaping Use  . Vaping Use: Never used  Substance Use Topics  . Alcohol use: No  . Drug use: No    Review of Systems  Constitutional: No fever/chills.  Positive for fatigue. Eyes: No visual changes. ENT: No sore throat. Cardiovascular: Denies chest pain. Respiratory: Denies shortness of breath. Gastrointestinal: No vomiting or diarrhea.  Genitourinary: Negative for dysuria.  Musculoskeletal: Negative for back pain. Skin: Negative for rash. Neurological: Negative for headaches, focal weakness or numbness.   ____________________________________________   PHYSICAL EXAM:  VITAL SIGNS: ED Triage Vitals  Enc Vitals Group     BP 01/02/21 1405 (!) 145/72     Pulse Rate 01/02/21 1405 78     Resp 01/02/21 1405 18     Temp 01/02/21 1405 98.1 F (36.7 C)     Temp Source 01/02/21 1405 Oral     SpO2 01/02/21 1405 98 %     Weight 01/02/21 1402 137 lb (62.1 kg)     Height 01/02/21 1402 '5\' 1"'$  (1.549 m)     Head Circumference --      Peak Flow --      Pain Score 01/02/21 1402 0     Pain Loc --      Pain Edu? --      Excl. in [place]? --     Constitutional: Alert and oriented.  Very well appearing for age and in no acute distress. Eyes: Conjunctivae are normal.  Head: Atraumatic. Nose: No congestion/rhinnorhea. Mouth/Throat: Mucous membranes are moist.   Neck: Normal range of motion.  Cardiovascular: Normal rate, regular rhythm. Grossly normal heart sounds.  Good peripheral circulation. Respiratory: Normal respiratory effort.  No retractions. Lungs CTAB. Gastrointestinal: No distention.  Musculoskeletal: No lower extremity edema.   Extremities warm and well perfused.  Neurologic:  Normal speech and language. No gross focal neurologic deficits are appreciated.  Skin:  Skin is warm and dry. No rash noted. Psychiatric: Mood and affect are normal. Speech and behavior are normal.  ____________________________________________   LABS (all labs ordered are listed, but only abnormal results are displayed)  Labs Reviewed  BASIC METABOLIC PANEL - Abnormal; Notable for the following components:      Result Value   Potassium 5.4 (*)    Glucose, Bld 148 (*)    BUN 38 (*)    Creatinine, Ser 1.85 (*)    GFR, Estimated 26 (*)    All other components within normal limits  CBC - Abnormal; Notable for the following components:   RBC 2.38 (*)    Hemoglobin 7.2 (*)    HCT 22.6 (*)    RDW 16.9 (*)    All other components within normal limits  URINALYSIS, COMPLETE (UACMP) WITH MICROSCOPIC - Abnormal; Notable for the following components:   Color, Urine YELLOW (*)  APPearance CLOUDY (*)    Nitrite POSITIVE (*)    Leukocytes,Ua LARGE (*)    WBC, UA >50 (*)    Bacteria, UA MANY (*)    All other components within normal limits  CBC WITH DIFFERENTIAL/PLATELET - Abnormal; Notable for the following components:   RBC 2.84 (*)    Hemoglobin 8.6 (*)    HCT 26.7 (*)    RDW 16.6 (*)    All other components within normal limits  BASIC METABOLIC PANEL - Abnormal; Notable for the following components:   Potassium 5.2 (*)    Glucose, Bld 204 (*)    BUN 37 (*)    Creatinine, Ser 1.70 (*)    GFR, Estimated 28 (*)    All other components within normal limits  TYPE AND SCREEN  PREPARE RBC (CROSSMATCH)   ____________________________________________  EKG  ED ECG REPORT I, Arta Silence, the attending physician, personally viewed and interpreted this ECG.  Date: 01/02/2021 EKG Time: 1404 Rate: 74 Rhythm: normal sinus rhythm QRS Axis: normal Intervals: normal ST/T Wave abnormalities: normal; no peaked T waves Narrative  Interpretation: no evidence of acute ischemia  ____________________________________________  RADIOLOGY    ____________________________________________   PROCEDURES  Procedure(s) performed: No  Procedures  Critical Care performed: No ____________________________________________   INITIAL IMPRESSION / ASSESSMENT AND PLAN / ED COURSE  Pertinent labs & imaging results that were available during my care of the patient were reviewed by me and considered in my medical decision making (see chart for details).  87 year old female with PMH as noted above including iron deficiency anemia, CHF, diabetes, and hypertension presents with increased fatigue over the last week.  She was noted to have a hemoglobin of 7.0 in her PMDs office today and was sent to the ED.  The patient denies other associated symptoms and has no abnormal bleeding or bruising.  I reviewed the past medical records in [place].  The patient has a history of iron deficiency anemia and has received Feraheme infusions and blood transfusions in the past.  She was most recently seen by her hematologist Dr. Grayland Ormond in March and at that time had a stable hemoglobin.  Appears that her hemoglobin generally has been around 9 at baseline.  On exam today the patient is very well-appearing for her age.  Her vital signs are normal except for mild hypertension.  The physical exam is otherwise unremarkable.  Labs here confirm a hemoglobin of 7.2.  I discussed the case with Dr. Rogue Bussing from hematology who agrees with giving the patient a transfusion in the ED.  The patient has a strong preference not to be admitted if at all possible, and Dr. B agrees that if she is able to receive the transfusion and is stable she would be appropriate for discharge home with hematology follow-up.  Work-up is also significant for apparent hyperkalemia, although the creatinine is at her recent baseline and there are no peaked T waves or other EKG  abnormalities.  I suspect that this may be slightly hemolyzed.  Urinalysis shows nitrites, bacteria, and significant WBCs.  The patient has no urinary symptoms.  ----------------------------------------- 10:22 PM on 01/02/2021 -----------------------------------------  The patient has received blood.  Her repeat hemoglobin is 8.6.  Repeat potassium is 5.2.  On the labs obtained by her PMD on 5/23 it was 4.9, so this is not a significant change.  There is no evidence of clinically significant hyperkalemia.  The patient states she feels well and is eager to go home.  At  this time she is stable for discharge.  I counseled her on the results of the work-up.  Return precautions given, and she expresses understanding.  ____________________________________________   FINAL CLINICAL IMPRESSION(S) / ED DIAGNOSES  Final diagnoses:  Symptomatic anemia  Hyperkalemia      NEW MEDICATIONS STARTED DURING THIS VISIT:  New Prescriptions   No medications on file     Note:  This document was prepared using Dragon voice recognition software and may include unintentional dictation errors.    Arta Silence, MD 01/02/21 2223

## 2021-01-02 NOTE — ED Notes (Signed)
See triage note. Pt to ED c/o weakness and tiredness for past several days. Denies dizziness. Denies blood in stool. Pt gets [facility] iron infusions at cancer center but is not cancer pt.

## 2021-01-02 NOTE — ED Notes (Signed)
Provided sandwich tray crackers water.

## 2021-01-02 NOTE — Discharge Instructions (Addendum)
Follow-up with your regular doctor and your hematologist.  Your potassium is slightly elevated today.  You do not require any treatment for this right now, but this should be rechecked when you follow-up with your doctor.  Return to the ER for new, worsening, or persistent weakness, lightheadedness, shortness of breath, chest pain, palpitations, or any other new or worsening symptoms that concern you.

## 2021-01-03 LAB — TYPE AND SCREEN
ABO/RH(D): A POS
Antibody Screen: NEGATIVE
Unit division: 0

## 2021-01-03 LAB — BPAM RBC
Blood Product Expiration Date: 202206212359
ISSUE DATE / TIME: 202205251702
Unit Type and Rh: 6200

## 2021-01-04 DIAGNOSIS — E782 Mixed hyperlipidemia: Secondary | ICD-10-CM | POA: Diagnosis not present

## 2021-01-04 DIAGNOSIS — R011 Cardiac murmur, unspecified: Secondary | ICD-10-CM | POA: Diagnosis not present

## 2021-01-04 DIAGNOSIS — G2581 Restless legs syndrome: Secondary | ICD-10-CM | POA: Diagnosis not present

## 2021-01-04 DIAGNOSIS — E611 Iron deficiency: Secondary | ICD-10-CM | POA: Diagnosis not present

## 2021-01-04 DIAGNOSIS — E1169 Type 2 diabetes mellitus with other specified complication: Secondary | ICD-10-CM | POA: Diagnosis not present

## 2021-01-08 ENCOUNTER — Other Ambulatory Visit: Payer: Self-pay | Admitting: Oncology

## 2021-01-11 ENCOUNTER — Inpatient Hospital Stay: Payer: Medicare HMO | Attending: Oncology

## 2021-01-11 DIAGNOSIS — E119 Type 2 diabetes mellitus without complications: Secondary | ICD-10-CM | POA: Insufficient documentation

## 2021-01-11 DIAGNOSIS — Z87891 Personal history of nicotine dependence: Secondary | ICD-10-CM | POA: Insufficient documentation

## 2021-01-11 DIAGNOSIS — I10 Essential (primary) hypertension: Secondary | ICD-10-CM | POA: Insufficient documentation

## 2021-01-11 DIAGNOSIS — Z7984 Long term (current) use of oral hypoglycemic drugs: Secondary | ICD-10-CM | POA: Diagnosis not present

## 2021-01-11 DIAGNOSIS — G2581 Restless legs syndrome: Secondary | ICD-10-CM | POA: Insufficient documentation

## 2021-01-11 DIAGNOSIS — D509 Iron deficiency anemia, unspecified: Secondary | ICD-10-CM | POA: Insufficient documentation

## 2021-01-11 DIAGNOSIS — Z79899 Other long term (current) drug therapy: Secondary | ICD-10-CM | POA: Diagnosis not present

## 2021-01-11 LAB — CBC WITH DIFFERENTIAL/PLATELET
Abs Immature Granulocytes: 0.02 10*3/uL (ref 0.00–0.07)
Basophils Absolute: 0.1 10*3/uL (ref 0.0–0.1)
Basophils Relative: 1 %
Eosinophils Absolute: 0.3 10*3/uL (ref 0.0–0.5)
Eosinophils Relative: 4 %
HCT: 24.2 % — ABNORMAL LOW (ref 36.0–46.0)
Hemoglobin: 7.7 g/dL — ABNORMAL LOW (ref 12.0–15.0)
Immature Granulocytes: 0 %
Lymphocytes Relative: 17 %
Lymphs Abs: 1.1 10*3/uL (ref 0.7–4.0)
MCH: 30.6 pg (ref 26.0–34.0)
MCHC: 31.8 g/dL (ref 30.0–36.0)
MCV: 96 fL (ref 80.0–100.0)
Monocytes Absolute: 0.4 10*3/uL (ref 0.1–1.0)
Monocytes Relative: 6 %
Neutro Abs: 4.7 10*3/uL (ref 1.7–7.7)
Neutrophils Relative %: 72 %
Platelets: 201 10*3/uL (ref 150–400)
RBC: 2.52 MIL/uL — ABNORMAL LOW (ref 3.87–5.11)
RDW: 16.2 % — ABNORMAL HIGH (ref 11.5–15.5)
WBC: 6.4 10*3/uL (ref 4.0–10.5)
nRBC: 0 % (ref 0.0–0.2)

## 2021-01-11 LAB — IRON AND TIBC
Iron: 105 ug/dL (ref 28–170)
Saturation Ratios: 27 % (ref 10.4–31.8)
TIBC: 393 ug/dL (ref 250–450)
UIBC: 288 ug/dL

## 2021-01-11 LAB — FERRITIN: Ferritin: 7 ng/mL — ABNORMAL LOW (ref 11–307)

## 2021-01-14 ENCOUNTER — Inpatient Hospital Stay (HOSPITAL_BASED_OUTPATIENT_CLINIC_OR_DEPARTMENT_OTHER): Payer: Medicare HMO | Admitting: Oncology

## 2021-01-14 ENCOUNTER — Other Ambulatory Visit: Payer: Self-pay

## 2021-01-14 ENCOUNTER — Inpatient Hospital Stay: Payer: Medicare HMO

## 2021-01-14 ENCOUNTER — Inpatient Hospital Stay: Payer: Medicare HMO | Admitting: Oncology

## 2021-01-14 VITALS — BP 134/47 | HR 77 | Resp 20 | Wt 139.6 lb

## 2021-01-14 VITALS — BP 143/48 | HR 68 | Resp 18

## 2021-01-14 DIAGNOSIS — D508 Other iron deficiency anemias: Secondary | ICD-10-CM

## 2021-01-14 DIAGNOSIS — Z87891 Personal history of nicotine dependence: Secondary | ICD-10-CM | POA: Diagnosis not present

## 2021-01-14 DIAGNOSIS — D509 Iron deficiency anemia, unspecified: Secondary | ICD-10-CM | POA: Diagnosis not present

## 2021-01-14 DIAGNOSIS — G2581 Restless legs syndrome: Secondary | ICD-10-CM | POA: Diagnosis not present

## 2021-01-14 DIAGNOSIS — Z7984 Long term (current) use of oral hypoglycemic drugs: Secondary | ICD-10-CM | POA: Diagnosis not present

## 2021-01-14 DIAGNOSIS — E119 Type 2 diabetes mellitus without complications: Secondary | ICD-10-CM | POA: Diagnosis not present

## 2021-01-14 DIAGNOSIS — Z79899 Other long term (current) drug therapy: Secondary | ICD-10-CM | POA: Diagnosis not present

## 2021-01-14 DIAGNOSIS — I10 Essential (primary) hypertension: Secondary | ICD-10-CM | POA: Diagnosis not present

## 2021-01-14 MED ORDER — SODIUM CHLORIDE 0.9 % IV SOLN
Freq: Once | INTRAVENOUS | Status: AC
  Filled 2021-01-14: qty 250

## 2021-01-14 MED ORDER — SODIUM CHLORIDE 0.9 % IV SOLN: 200.0000 mg | Freq: Once | INTRAVENOUS | Status: DC

## 2021-01-14 MED ORDER — IRON SUCROSE 20 MG/ML IV SOLN
200.0000 mg | Freq: Once | INTRAVENOUS | Status: AC
Start: 2021-01-14 — End: 2021-01-14
  Administered 2021-01-14: 200 mg via INTRAVENOUS
  Filled 2021-01-14: qty 10

## 2021-01-14 NOTE — Patient Instructions (Signed)

## 2021-01-14 NOTE — Progress Notes (Signed)
[place]  [phone]) [phone] [phone]) [phone]  ID: Lisa Mcneil OB: [birth year]  MR#: [id number]  [id number]  Patient Care Team: Dion Body, MD as PCP - General (Family Medicine) Lloyd Huger, MD as Consulting Physician (Hematology and Oncology)  CHIEF COMPLAINT:  Iron deficiency anemia.  INTERVAL HISTORY: Patient returns to clinic today for an acute add-on visit due to feeling significantly fatigued.  She was last seen in clinic on 10/18/2020.  Iron levels were trending down although hemoglobin was stable.  Dr. Grayland Ormond recommended additional IV iron but patient declined.  She has been seen several times in the emergency room for various concerns.  Hospitalized from 11/28/20-11/29/2020 for acute CHF exacerbation and treated with Lasix.  She was evaluated on 01/03/2020 for abnormal labs and weakness.  Her hemoglobin was found to be 7.2.  She was given a unit of packed red blood cells with improvement of her symptoms.  Today, patient reports feeling significantly more tired than usual.  She denies any shortness of breath or chest pain.  She is currently taking 1 iron supplement daily.  Denies any constipation or abdominal pain.  Reports that she has never had a colonoscopy or EGD.  Denies any blood in her stool although her stools are "dark" due to iron supplements.  She is not interested in being evaluated by GI.  REVIEW OF SYSTEMS:   Review of Systems  Constitutional: Positive for malaise/fatigue. Negative for fever and weight loss.  Respiratory: Negative.  Negative for cough and shortness of breath.   Cardiovascular: Negative.  Negative for chest pain and leg swelling.  Gastrointestinal: Negative.  Negative for abdominal pain, blood in stool and melena.       Dark stools  Genitourinary: Negative.  Negative for hematuria.  Musculoskeletal: Negative.  Negative for back pain.  Skin: Negative.  Negative for rash.  Neurological: Negative.   Negative for sensory change, focal weakness and weakness.  Psychiatric/Behavioral: Negative.  The patient is not nervous/anxious.     As per HPI. Otherwise, a complete review of systems is negative.  PAST MEDICAL HISTORY: Past Medical History:  Diagnosis Date  . CHF (congestive heart failure) ([place])   . Diabetes mellitus without complication ([place])   . GERD (gastroesophageal reflux disease)   . Hypertension   . Insomnia   . Iron deficiency anemia   . RLS (restless legs syndrome)     PAST SURGICAL HISTORY: Past Surgical History:  Procedure Laterality Date  . BLADDER REPAIR    . CATARACT EXTRACTION W/PHACO Right 05/13/2016   Procedure: CATARACT EXTRACTION PHACO AND INTRAOCULAR LENS PLACEMENT (IOC);  Surgeon: Birder Robson, MD;  Location: [facility] ORS;  Service: Ophthalmology;  Laterality: Right;  [place] 1.09 AP% 21.0 CDE 14.60 Fluid Pack Lot # [id number]  . CATARACT EXTRACTION W/PHACO Left 07/15/2016   Procedure: CATARACT EXTRACTION PHACO AND INTRAOCULAR LENS PLACEMENT (IOC);  Surgeon: Birder Robson, MD;  Location: [facility] ORS;  Service: Ophthalmology;  Laterality: Left;  Lot# [id number] H [place]: 01:17.9 AP%:27.8 CDE: 21.67     FAMILY HISTORY: Reviewed and unchanged. No reported history of malignancy or chronic disease.     ADVANCED DIRECTIVES:    HEALTH MAINTENANCE: Social History   Tobacco Use  . Smoking status: Former Research scientist (life sciences)  . Smokeless tobacco: Never Used  Vaping Use  . Vaping Use: Never used  Substance Use Topics  . Alcohol use: No  . Drug use: No     Colonoscopy:  PAP:  Bone density:  Lipid panel:  Allergies  Allergen Reactions  .  Codeine   . Demerol [Meperidine]   . Polysaccharide Iron Complex Other (See Comments)    Caused constipation  . Naproxen Rash    Caused rash on mouth    Current Outpatient Medications  Medication Sig Dispense Refill  . amLODipine (NORVASC) 10 MG tablet Take 10 mg by mouth daily.    . Calcium Carbonate-Vit D-Min (CALTRATE 600+D PLUS  MINERALS) 600-800 MG-UNIT CHEW Chew 1 tablet by mouth as directed.    . ferrous sulfate 325 (65 FE) MG tablet Take 325 mg by mouth daily.    . furosemide (LASIX) 20 MG tablet Take 20 mg by mouth daily as needed.    . gabapentin (NEURONTIN) 100 MG capsule Take 100 mg by mouth at bedtime.    Marland Kitchen lisinopril-hydrochlorothiazide (ZESTORETIC) 20-12.5 MG tablet Take 1 tablet by mouth daily.    . metFORMIN (GLUCOPHAGE) 500 MG tablet Take 500 mg by mouth 2 (two) times daily with a meal.    . Multiple Vitamins-Minerals (MULTIVITAMIN WITH MINERALS) tablet Take 1 tablet by mouth daily.    Marland Kitchen omeprazole (PRILOSEC) 20 MG capsule Take 20 mg by mouth 2 (two) times daily.    . pramipexole (MIRAPEX) 0.25 MG tablet Take 0.25 mg by mouth in the morning and at bedtime.    . rosuvastatin (CRESTOR) 5 MG tablet Take 5 mg by mouth at bedtime.    . traZODone (DESYREL) 50 MG tablet Take 50 mg by mouth at bedtime.    . furosemide (LASIX) 20 MG tablet Take 1 tablet (20 mg total) by mouth daily. 30 tablet 0   No current facility-administered medications for this visit.    OBJECTIVE: Vitals:   01/14/21 1327  BP: (!) 134/47  Pulse: 77  Resp: 20  SpO2: 92%     Body mass index is 26.38 kg/m.    ECOG FS:0 - Asymptomatic  Physical Exam Constitutional:      Appearance: Normal appearance.  HENT:     Head: Normocephalic and atraumatic.  Eyes:     Pupils: Pupils are equal, round, and reactive to light.  Cardiovascular:     Rate and Rhythm: Normal rate and regular rhythm.     Heart sounds: Normal heart sounds. No murmur heard.   Pulmonary:     Effort: Pulmonary effort is normal.     Breath sounds: Normal breath sounds. No wheezing.  Abdominal:     General: Bowel sounds are normal. There is no distension.     Palpations: Abdomen is soft.     Tenderness: There is no abdominal tenderness.  Musculoskeletal:        General: Normal range of motion.     Cervical back: Normal range of motion.  Skin:    General: Skin is  warm and dry.     Findings: No rash.  Neurological:     Mental Status: She is alert and oriented to person, place, and time.  Psychiatric:        Judgment: Judgment normal.     LAB RESULTS:  Lab Results  Component Value Date   NA 136 01/02/2021   K 5.2 (H) 01/02/2021   CL 105 01/02/2021   CO2 22 01/02/2021   GLUCOSE 204 (H) 01/02/2021   BUN 37 (H) 01/02/2021   CREATININE 1.70 (H) 01/02/2021   CALCIUM 9.3 01/02/2021   PROT 7.8 02/16/2015   ALBUMIN 4.0 02/16/2015   AST 27 02/16/2015   ALT 18 02/16/2015   ALKPHOS 56 02/16/2015   BILITOT 0.3 02/16/2015   GFRNONAA  28 (L) 01/02/2021   GFRAA >60 02/17/2015    Lab Results  Component Value Date   WBC 6.4 01/11/2021   NEUTROABS 4.7 01/11/2021   HGB 7.7 (L) 01/11/2021   HCT 24.2 (L) 01/11/2021   MCV 96.0 01/11/2021   PLT 201 01/11/2021   Lab Results  Component Value Date   IRON 105 01/11/2021   TIBC 393 01/11/2021   IRONPCTSAT 27 01/11/2021   Lab Results  Component Value Date   FERRITIN 7 (L) 01/11/2021     STUDIES: No results found.  ASSESSMENT: Iron deficiency anemia.  PLAN:    1. Iron deficiency anemia:  Patient has never had a colonoscopy or EGD. Reports dark stools secondary to iron supplements. Tolerating 1 ferrous sulfate daily. Received IV Feraheme last on 07/19/2020 and 07/25/2020. Received 1 unit packed red blood cells while in the ED on 01/02/2021 for a hemoglobin of 7.2. Labs from today show hemoglobin of 7.7, ferritin 7 and iron saturation 27%. Recommend IV Venofer x5 doses. She is not interested in being evaluated by GI.  Disposition: Proceed with IV Venofer today x4 additional doses. Return to clinic in 3 months with repeat laboratory work, further evaluation, and consideration of additional IV iron if needed.    Greater than 50% was spent in counseling and coordination of care with this patient including but not limited to discussion of the relevant topics above (See A&P) including, but not  limited to diagnosis and management of acute and chronic medical conditions.   Patient expressed understanding and was in agreement with this plan. She also understands that She can call clinic at any time with any questions, concerns, or complaints.    Jacquelin Hawking, NP   01/14/2021 2:30 PM

## 2021-01-18 ENCOUNTER — Other Ambulatory Visit: Payer: Self-pay

## 2021-01-18 ENCOUNTER — Inpatient Hospital Stay: Payer: Medicare HMO

## 2021-01-18 VITALS — BP 118/41 | HR 91 | Temp 97.1°F | Resp 18

## 2021-01-18 DIAGNOSIS — E119 Type 2 diabetes mellitus without complications: Secondary | ICD-10-CM | POA: Diagnosis not present

## 2021-01-18 DIAGNOSIS — Z87891 Personal history of nicotine dependence: Secondary | ICD-10-CM | POA: Diagnosis not present

## 2021-01-18 DIAGNOSIS — D509 Iron deficiency anemia, unspecified: Secondary | ICD-10-CM | POA: Diagnosis not present

## 2021-01-18 DIAGNOSIS — G2581 Restless legs syndrome: Secondary | ICD-10-CM | POA: Diagnosis not present

## 2021-01-18 DIAGNOSIS — Z7984 Long term (current) use of oral hypoglycemic drugs: Secondary | ICD-10-CM | POA: Diagnosis not present

## 2021-01-18 DIAGNOSIS — D508 Other iron deficiency anemias: Secondary | ICD-10-CM

## 2021-01-18 DIAGNOSIS — Z79899 Other long term (current) drug therapy: Secondary | ICD-10-CM | POA: Diagnosis not present

## 2021-01-18 DIAGNOSIS — I10 Essential (primary) hypertension: Secondary | ICD-10-CM | POA: Diagnosis not present

## 2021-01-18 MED ORDER — SODIUM CHLORIDE 0.9 % IV SOLN: 200.0000 mg | Freq: Once | INTRAVENOUS | Status: DC

## 2021-01-18 MED ORDER — IRON SUCROSE 20 MG/ML IV SOLN
200.0000 mg | Freq: Once | INTRAVENOUS | Status: AC
  Administered 2021-01-18: 200 mg via INTRAVENOUS
  Filled 2021-01-18: qty 10

## 2021-01-18 MED ORDER — SODIUM CHLORIDE 0.9 % IV SOLN
Freq: Once | INTRAVENOUS | Status: AC
  Filled 2021-01-18: qty 250

## 2021-01-18 NOTE — Patient Instructions (Signed)
[facility]  Discharge Instructions: Thank you for choosing [facility] to provide your oncology and hematology care.  If you have a lab appointment with the [facility], please go directly to the [facility] and check in at the registration area.  Wear comfortable clothing and clothing appropriate for easy access to any Portacath or PICC line.   We strive to give you quality time with your provider. You may need to reschedule your appointment if you arrive late (15 or more minutes).  Arriving late affects you and other patients whose appointments are after yours.  Also, if you miss three or more appointments without notifying the office, you may be dismissed from the clinic at the provider's discretion.      For prescription refill requests, have your pharmacy contact our office and allow 72 hours for refills to be completed.    Today you received the following chemotherapy and/or immunotherapy agents VENOFEr      To help prevent nausea and vomiting after your treatment, we encourage you to take your nausea medication as directed.  BELOW ARE SYMPTOMS THAT SHOULD BE REPORTED IMMEDIATELY: *FEVER GREATER THAN 100.4 F (38 C) OR HIGHER *CHILLS OR SWEATING *NAUSEA AND VOMITING THAT IS NOT CONTROLLED WITH YOUR NAUSEA MEDICATION *UNUSUAL SHORTNESS OF BREATH *UNUSUAL BRUISING OR BLEEDING *URINARY PROBLEMS (pain or burning when urinating, or frequent urination) *BOWEL PROBLEMS (unusual diarrhea, constipation, pain near the anus) TENDERNESS IN MOUTH AND THROAT WITH OR WITHOUT PRESENCE OF ULCERS (sore throat, sores in mouth, or a toothache) UNUSUAL RASH, SWELLING OR PAIN  UNUSUAL VAGINAL DISCHARGE OR ITCHING   Items with * indicate a potential emergency and should be followed up as soon as possible or go to the Emergency Department if any problems should occur.  Please show the CHEMOTHERAPY ALERT CARD or IMMUNOTHERAPY ALERT CARD at check-in to  the Emergency Department and triage nurse.  Should you have questions after your visit or need to cancel or reschedule your appointment, please contact [facility]  [phone] and follow the prompts.  Office hours are 8:00 a.m. to 4:30 p.m. Monday - Friday. Please note that voicemails left after 4:00 p.m. may not be returned until the following business day.  We are closed weekends and major holidays. You have access to a nurse at all times for urgent questions. Please call the main number to the clinic [phone] and follow the prompts.  For any non-urgent questions, you may also contact your provider using MyChart. We now offer e-Visits for anyone 17 and older to request care online for non-urgent symptoms. For details visit [email or web address].   Also download the MyChart app! Go to the app store, search "MyChart", open the app, select [facility], and log in with your MyChart username and password.  Due to Covid, a mask is required upon entering the hospital/clinic. If you do not have a mask, one will be given to you upon arrival. For doctor visits, patients may have 1 support person aged 80 or older with them. For treatment visits, patients cannot have anyone with them due to current Covid guidelines and our immunocompromised population.   Iron Sucrose injection What is this medication? IRON SUCROSE (AHY ern SOO krohs) is an iron complex. Iron is used to make healthy red blood cells, which carry oxygen and nutrients throughout the body. This medicine is used to treat iron deficiency anemia in people with chronickidney disease. This medicine may be used for other  purposes; ask your health care provider orpharmacist if you have questions. COMMON BRAND NAME(S): Venofer What should I tell my care team before I take this medication? They need to know if you have any of these conditions: anemia not caused by low iron levels heart disease high levels of  iron in the blood kidney disease liver disease an unusual or allergic reaction to iron, other medicines, foods, dyes, or preservatives pregnant or trying to get pregnant breast-feeding How should I use this medication? This medicine is for infusion into a vein. It is given by a health careprofessional in a hospital or clinic setting. Talk to your pediatrician regarding the use of this medicine in children. While this drug may be prescribed for children as young as 2 years for selectedconditions, precautions do apply. Overdosage: If you think you have taken too much of this medicine contact apoison control center or emergency room at once. NOTE: This medicine is only for you. Do not share this medicine with others. What if I miss a dose? It is important not to miss your dose. Call your doctor or health careprofessional if you are unable to keep an appointment. What may interact with this medication? Do not take this medicine with any of the following medications: deferoxamine dimercaprol other iron products This medicine may also interact with the following medications: chloramphenicol deferasirox This list may not describe all possible interactions. Give your health care provider a list of all the medicines, herbs, non-prescription drugs, or dietary supplements you use. Also tell them if you smoke, drink alcohol, or use illegaldrugs. Some items may interact with your medicine. What should I watch for while using this medication? Visit your doctor or healthcare professional regularly. Tell your doctor or healthcare professional if your symptoms do not start to get better or if theyget worse. You may need blood work done while you are taking this medicine. You may need to follow a special diet. Talk to your doctor. Foods that contain iron include: whole grains/cereals, dried fruits, beans, or peas, leafy greenvegetables, and organ meats (liver, kidney). What side effects may I notice from  receiving this medication? Side effects that you should report to your doctor or health care professionalas soon as possible: allergic reactions like skin rash, itching or hives, swelling of the face, lips, or tongue breathing problems changes in blood pressure cough fast, irregular heartbeat feeling faint or lightheaded, falls fever or chills flushing, sweating, or hot feelings joint or muscle aches/pains seizures swelling of the ankles or feet unusually weak or tired Side effects that usually do not require medical attention (report to yourdoctor or health care professional if they continue or are bothersome): diarrhea feeling achy headache irritation at site where injected nausea, vomiting stomach upset tiredness This list may not describe all possible side effects. Call your doctor for medical advice about side effects. You may report side effects to FDA at1-800-FDA-1088. Where should I keep my medication? This drug is given in a hospital or clinic and will not be stored at home. NOTE: This sheet is a summary. It may not cover all possible information. If you have questions about this medicine, talk to your doctor, pharmacist, orhealth care provider.  2022 Elsevier/Gold Standard (2011-05-08 17:14:35)

## 2021-01-21 ENCOUNTER — Other Ambulatory Visit: Payer: Self-pay

## 2021-01-21 ENCOUNTER — Inpatient Hospital Stay: Payer: Medicare HMO

## 2021-01-21 VITALS — BP 111/39 | HR 72 | Temp 97.1°F | Resp 18

## 2021-01-21 DIAGNOSIS — Z7984 Long term (current) use of oral hypoglycemic drugs: Secondary | ICD-10-CM | POA: Diagnosis not present

## 2021-01-21 DIAGNOSIS — Z79899 Other long term (current) drug therapy: Secondary | ICD-10-CM | POA: Diagnosis not present

## 2021-01-21 DIAGNOSIS — I10 Essential (primary) hypertension: Secondary | ICD-10-CM | POA: Diagnosis not present

## 2021-01-21 DIAGNOSIS — Z87891 Personal history of nicotine dependence: Secondary | ICD-10-CM | POA: Diagnosis not present

## 2021-01-21 DIAGNOSIS — D509 Iron deficiency anemia, unspecified: Secondary | ICD-10-CM | POA: Diagnosis not present

## 2021-01-21 DIAGNOSIS — E119 Type 2 diabetes mellitus without complications: Secondary | ICD-10-CM | POA: Diagnosis not present

## 2021-01-21 DIAGNOSIS — D508 Other iron deficiency anemias: Secondary | ICD-10-CM

## 2021-01-21 DIAGNOSIS — G2581 Restless legs syndrome: Secondary | ICD-10-CM | POA: Diagnosis not present

## 2021-01-21 MED ORDER — IRON SUCROSE 20 MG/ML IV SOLN
200.0000 mg | Freq: Once | INTRAVENOUS | Status: AC
  Administered 2021-01-21: 200 mg via INTRAVENOUS
  Filled 2021-01-21: qty 10

## 2021-01-21 MED ORDER — SODIUM CHLORIDE 0.9 % IV SOLN
Freq: Once | INTRAVENOUS | Status: AC
  Filled 2021-01-21: qty 250

## 2021-01-21 MED ORDER — SODIUM CHLORIDE 0.9 % IV SOLN: 200.0000 mg | Freq: Once | INTRAVENOUS | Status: DC

## 2021-01-21 NOTE — Patient Instructions (Signed)

## 2021-01-23 ENCOUNTER — Other Ambulatory Visit: Payer: Self-pay

## 2021-01-23 ENCOUNTER — Inpatient Hospital Stay: Payer: Medicare HMO

## 2021-01-23 VITALS — BP 105/51 | HR 70 | Temp 97.8°F | Resp 16

## 2021-01-23 DIAGNOSIS — Z7984 Long term (current) use of oral hypoglycemic drugs: Secondary | ICD-10-CM | POA: Diagnosis not present

## 2021-01-23 DIAGNOSIS — I10 Essential (primary) hypertension: Secondary | ICD-10-CM | POA: Diagnosis not present

## 2021-01-23 DIAGNOSIS — G2581 Restless legs syndrome: Secondary | ICD-10-CM | POA: Diagnosis not present

## 2021-01-23 DIAGNOSIS — E119 Type 2 diabetes mellitus without complications: Secondary | ICD-10-CM | POA: Diagnosis not present

## 2021-01-23 DIAGNOSIS — Z79899 Other long term (current) drug therapy: Secondary | ICD-10-CM | POA: Diagnosis not present

## 2021-01-23 DIAGNOSIS — D509 Iron deficiency anemia, unspecified: Secondary | ICD-10-CM | POA: Diagnosis not present

## 2021-01-23 DIAGNOSIS — Z87891 Personal history of nicotine dependence: Secondary | ICD-10-CM | POA: Diagnosis not present

## 2021-01-23 DIAGNOSIS — D508 Other iron deficiency anemias: Secondary | ICD-10-CM

## 2021-01-23 MED ORDER — SODIUM CHLORIDE 0.9 % IV SOLN
Freq: Once | INTRAVENOUS | Status: AC
  Filled 2021-01-23: qty 250

## 2021-01-23 MED ORDER — SODIUM CHLORIDE 0.9 % IV SOLN: 200.0000 mg | Freq: Once | INTRAVENOUS | Status: DC

## 2021-01-23 MED ORDER — IRON SUCROSE 20 MG/ML IV SOLN
200.0000 mg | Freq: Once | INTRAVENOUS | Status: AC
  Administered 2021-01-23: 200 mg via INTRAVENOUS
  Filled 2021-01-23: qty 10

## 2021-01-23 MED ORDER — IRON SUCROSE 20 MG/ML IV SOLN: 200.0000 mg | Freq: Once | INTRAVENOUS | Status: DC

## 2021-01-25 ENCOUNTER — Inpatient Hospital Stay: Payer: Medicare HMO

## 2021-01-25 VITALS — BP 110/55 | HR 68 | Temp 97.8°F | Resp 18

## 2021-01-25 DIAGNOSIS — G2581 Restless legs syndrome: Secondary | ICD-10-CM | POA: Diagnosis not present

## 2021-01-25 DIAGNOSIS — Z7984 Long term (current) use of oral hypoglycemic drugs: Secondary | ICD-10-CM | POA: Diagnosis not present

## 2021-01-25 DIAGNOSIS — D509 Iron deficiency anemia, unspecified: Secondary | ICD-10-CM | POA: Diagnosis not present

## 2021-01-25 DIAGNOSIS — I10 Essential (primary) hypertension: Secondary | ICD-10-CM | POA: Diagnosis not present

## 2021-01-25 DIAGNOSIS — E119 Type 2 diabetes mellitus without complications: Secondary | ICD-10-CM | POA: Diagnosis not present

## 2021-01-25 DIAGNOSIS — Z87891 Personal history of nicotine dependence: Secondary | ICD-10-CM | POA: Diagnosis not present

## 2021-01-25 DIAGNOSIS — D508 Other iron deficiency anemias: Secondary | ICD-10-CM

## 2021-01-25 DIAGNOSIS — Z79899 Other long term (current) drug therapy: Secondary | ICD-10-CM | POA: Diagnosis not present

## 2021-01-25 MED ORDER — SODIUM CHLORIDE 0.9 % IV SOLN
Freq: Once | INTRAVENOUS | Status: AC
Start: 2021-01-25 — End: 2021-01-25
  Filled 2021-01-25: qty 250

## 2021-01-25 MED ORDER — IRON SUCROSE 20 MG/ML IV SOLN
200.0000 mg | Freq: Once | INTRAVENOUS | Status: AC
  Administered 2021-01-25: 200 mg via INTRAVENOUS
  Filled 2021-01-25: qty 10

## 2021-01-25 MED ORDER — SODIUM CHLORIDE 0.9 % IV SOLN: 200.0000 mg | Freq: Once | INTRAVENOUS | Status: DC

## 2021-01-25 NOTE — Patient Instructions (Signed)
[facility]   Discharge Instructions: Thank you for choosing [facility] to provide your oncology and hematology care.  If you have a lab appointment with the Cancer Center, please go directly to the Cancer Center and check in at the registration area.  We strive to give you quality time with your provider. You may need to reschedule your appointment if you arrive late (15 or more minutes).  Arriving late affects you and other patients whose appointments are after yours.  Also, if you miss three or more appointments without notifying the office, you may be dismissed from the clinic at the provider's discretion.      For prescription refill requests, have your pharmacy contact our office and allow 72 hours for refills to be completed.    Today you received the following: Venofer.      BELOW ARE SYMPTOMS THAT SHOULD BE REPORTED IMMEDIATELY: *FEVER GREATER THAN 100.4 F (38 C) OR HIGHER *CHILLS OR SWEATING *NAUSEA AND VOMITING THAT IS NOT CONTROLLED WITH YOUR NAUSEA MEDICATION *UNUSUAL SHORTNESS OF BREATH *UNUSUAL BRUISING OR BLEEDING *URINARY PROBLEMS (pain or burning when urinating, or frequent urination) *BOWEL PROBLEMS (unusual diarrhea, constipation, pain near the anus) TENDERNESS IN MOUTH AND THROAT WITH OR WITHOUT PRESENCE OF ULCERS (sore throat, sores in mouth, or a toothache) UNUSUAL RASH, SWELLING OR PAIN  UNUSUAL VAGINAL DISCHARGE OR ITCHING   Items with * indicate a potential emergency and should be followed up as soon as possible or go to the Emergency Department if any problems should occur.  Should you have questions after your visit or need to cancel or reschedule your appointment, please contact [facility]  [phone] and follow the prompts.  Office hours are 8:00 a.m. to 4:30 p.m. Monday - Friday. Please note that voicemails left after 4:00 p.m. may not be returned until the following  business day.  We are closed weekends and major holidays. You have access to a nurse at all times for urgent questions. Please call the main number to the clinic [phone] and follow the prompts.  For any non-urgent questions, you may also contact your provider using MyChart. We now offer e-Visits for anyone 18 and older to request care online for non-urgent symptoms. For details visit [email or web address].   Also download the MyChart app! Go to the app store, search "MyChart", open the app, select [facility], and log in with your MyChart username and password.  Due to Covid, a mask is required upon entering the hospital/clinic. If you do not have a mask, one will be given to you upon arrival. For doctor visits, patients may have 1 support person aged 18 or older with them. For treatment visits, patients cannot have anyone with them due to current Covid guidelines and our immunocompromised population.  

## 2021-02-05 DIAGNOSIS — Z4689 Encounter for fitting and adjustment of other specified devices: Secondary | ICD-10-CM | POA: Diagnosis not present

## 2021-04-20 NOTE — Progress Notes (Signed)
[facility]  Telephone:([phone] Fax:(336) [phone]  ID: Kyla Fantauzzi OB: 1927-10-09  MR#: [id number]  [id number]  Patient Care Team: Dion Body, MD as PCP - General (Family Medicine) Lloyd Huger, MD as Consulting Physician (Hematology and Oncology)  CHIEF COMPLAINT:  Iron deficiency anemia.  INTERVAL HISTORY: Patient returns to clinic today for repeat laboratory work, further evaluation and consideration of additional IV Venofer.  She currently feels well and is asymptomatic.  She remains active and work full-time.  She denies any weakness or fatigue. She has no neurologic complaints. She denies any recent fevers or illnesses. She has a good appetite and denies weight loss.  She denies any chest pain, shortness of breath, cough, or hemoptysis.  She denies any nausea, vomiting, consultation, or diarrhea. She has no melena or hematochezia.  She has no urinary complaints.  Patient offers no specific complaints today.  REVIEW OF SYSTEMS:   Review of Systems  Constitutional: Negative.  Negative for fever, malaise/fatigue and weight loss.  Respiratory: Negative.  Negative for cough and shortness of breath.   Cardiovascular: Negative.  Negative for chest pain and leg swelling.  Gastrointestinal: Negative.  Negative for abdominal pain, blood in stool and melena.  Genitourinary: Negative.  Negative for hematuria.  Musculoskeletal: Negative.  Negative for back pain.  Skin: Negative.  Negative for rash.  Neurological: Negative.  Negative for sensory change, focal weakness and weakness.  Psychiatric/Behavioral: Negative.  The patient is not nervous/anxious.    As per HPI. Otherwise, a complete review of systems is negative.  PAST MEDICAL HISTORY: Past Medical History:  Diagnosis Date   CHF (congestive heart failure) (HCC)    Diabetes mellitus without complication (HCC)    GERD (gastroesophageal reflux disease)    Hypertension    Insomnia     Iron deficiency anemia    RLS (restless legs syndrome)     PAST SURGICAL HISTORY: Past Surgical History:  Procedure Laterality Date   BLADDER REPAIR     CATARACT EXTRACTION W/PHACO Right 05/13/2016   Procedure: CATARACT EXTRACTION PHACO AND INTRAOCULAR LENS PLACEMENT ([place]);  Surgeon: Birder Robson, MD;  Location: [facility] ORS;  Service: Ophthalmology;  Laterality: Right;  [place] 1.09 AP% 21.0 CDE 14.60 Fluid Pack Lot # [id number]   CATARACT EXTRACTION W/PHACO Left 07/15/2016   Procedure: CATARACT EXTRACTION PHACO AND INTRAOCULAR LENS PLACEMENT ([facility]);  Surgeon: Birder Robson, MD;  Location: [facility] ORS;  Service: Ophthalmology;  Laterality: Left;  Lot# [id number] H [place]: 01:17.9 AP%:27.8 CDE: 21.67     FAMILY HISTORY: Reviewed and unchanged. No reported history of malignancy or chronic disease.     ADVANCED DIRECTIVES:    HEALTH MAINTENANCE: Social History   Tobacco Use   Smoking status: Former   Smokeless tobacco: Never  Scientific laboratory technician Use: Never used  Substance Use Topics   Alcohol use: No   Drug use: No     Colonoscopy:  PAP:  Bone density:  Lipid panel:  Allergies  Allergen Reactions   Codeine    Demerol [Meperidine]    Polysaccharide Iron Complex Other (See Comments)    Caused constipation   Naproxen Rash    Caused rash on mouth    Current Outpatient Medications  Medication Sig Dispense Refill   amLODipine (NORVASC) 10 MG tablet Take 10 mg by mouth daily.     Calcium Carbonate-Vit D-Min (CALTRATE 600+D PLUS MINERALS) 600-800 MG-UNIT CHEW Chew 1 tablet by mouth as directed.     ferrous sulfate 325 (65 FE) MG tablet  Take 325 mg by mouth daily.     gabapentin (NEURONTIN) 100 MG capsule Take 100 mg by mouth at bedtime.     lisinopril-hydrochlorothiazide (ZESTORETIC) 20-12.5 MG tablet Take 1 tablet by mouth daily.     metFORMIN (GLUCOPHAGE) 500 MG tablet Take 500 mg by mouth 2 (two) times daily with a meal.     Multiple Vitamins-Minerals (MULTIVITAMIN WITH  MINERALS) tablet Take 1 tablet by mouth daily.     omeprazole (PRILOSEC) 20 MG capsule Take 20 mg by mouth 2 (two) times daily.     pramipexole (MIRAPEX) 0.25 MG tablet Take 0.25 mg by mouth in the morning and at bedtime.     rosuvastatin (CRESTOR) 5 MG tablet Take 5 mg by mouth at bedtime.     traZODone (DESYREL) 50 MG tablet Take 50 mg by mouth at bedtime.     furosemide (LASIX) 20 MG tablet Take 1 tablet (20 mg total) by mouth daily. 30 tablet 0   furosemide (LASIX) 20 MG tablet Take 20 mg by mouth daily as needed. (Patient not taking: Reported on 04/23/2021)     No current facility-administered medications for this visit.    OBJECTIVE: Vitals:   04/23/21 1419  BP: 123/79  Pulse: 80  Resp: 18  Temp: 97.7 F (36.5 C)  SpO2: 99%     Body mass index is 26.06 kg/m.    ECOG FS:0 - Asymptomatic  General: Well-developed, well-nourished, no acute distress. Eyes: Pink conjunctiva, anicteric sclera. HEENT: Normocephalic, moist mucous membranes. Lungs: No audible wheezing or coughing. Heart: Regular rate and rhythm. Abdomen: Soft, nontender, no obvious distention. Musculoskeletal: No edema, cyanosis, or clubbing. Neuro: Alert, answering all questions appropriately. Cranial nerves grossly intact. Skin: No rashes or petechiae noted. Psych: Normal affect.   LAB RESULTS:  Lab Results  Component Value Date   NA 136 01/02/2021   K 5.2 (H) 01/02/2021   CL 105 01/02/2021   CO2 22 01/02/2021   GLUCOSE 204 (H) 01/02/2021   BUN 37 (H) 01/02/2021   CREATININE 1.70 (H) 01/02/2021   CALCIUM 9.3 01/02/2021   PROT 7.8 02/16/2015   ALBUMIN 4.0 02/16/2015   AST 27 02/16/2015   ALT 18 02/16/2015   ALKPHOS 56 02/16/2015   BILITOT 0.3 02/16/2015   GFRNONAA 28 (L) 01/02/2021   GFRAA >60 02/17/2015    Lab Results  Component Value Date   WBC 6.6 04/23/2021   NEUTROABS 4.7 04/23/2021   HGB 8.6 (L) 04/23/2021   HCT 26.6 (L) 04/23/2021   MCV 96.7 04/23/2021   PLT 208 04/23/2021   Lab  Results  Component Value Date   IRON 56 04/23/2021   TIBC 360 04/23/2021   IRONPCTSAT 16 04/23/2021   Lab Results  Component Value Date   FERRITIN 14 04/23/2021     STUDIES: No results found.  ASSESSMENT: Iron deficiency anemia.  PLAN:    1. Iron deficiency anemia: Although patient's hemoglobin remains decreased, her iron stores are now within normal limits and she is asymptomatic.  Previously, the remainder of her blood work was either negative or within normal limits.  She last received treatment with IV Venofer on January 25, 2021. No intervention is needed.  Return to clinic in 4 months with repeat laboratory  work, further evaluation, and consideration of additional treatment if needed.    I spent a total of 20 minutes reviewing chart data, face-to-face evaluation with the patient, counseling and coordination of care as detailed above.   Patient expressed understanding and was in  agreement with this plan. She also understands that She can call clinic at any time with any questions, concerns, or complaints.    Lloyd Huger, MD   04/23/2021 7:07 PM

## 2021-04-23 ENCOUNTER — Inpatient Hospital Stay: Payer: Medicare HMO

## 2021-04-23 ENCOUNTER — Inpatient Hospital Stay (HOSPITAL_BASED_OUTPATIENT_CLINIC_OR_DEPARTMENT_OTHER): Payer: Medicare HMO | Admitting: Oncology

## 2021-04-23 ENCOUNTER — Inpatient Hospital Stay: Payer: Medicare HMO | Attending: Oncology

## 2021-04-23 VITALS — BP 123/79 | HR 80 | Temp 97.7°F | Resp 18 | Wt 137.9 lb

## 2021-04-23 DIAGNOSIS — Z87891 Personal history of nicotine dependence: Secondary | ICD-10-CM | POA: Diagnosis not present

## 2021-04-23 DIAGNOSIS — D509 Iron deficiency anemia, unspecified: Secondary | ICD-10-CM

## 2021-04-23 LAB — CBC WITH DIFFERENTIAL/PLATELET
Abs Immature Granulocytes: 0.01 10*3/uL (ref 0.00–0.07)
Basophils Absolute: 0.1 10*3/uL (ref 0.0–0.1)
Basophils Relative: 1 %
Eosinophils Absolute: 0.2 10*3/uL (ref 0.0–0.5)
Eosinophils Relative: 4 %
HCT: 26.6 % — ABNORMAL LOW (ref 36.0–46.0)
Hemoglobin: 8.6 g/dL — ABNORMAL LOW (ref 12.0–15.0)
Immature Granulocytes: 0 %
Lymphocytes Relative: 18 %
Lymphs Abs: 1.2 10*3/uL (ref 0.7–4.0)
MCH: 31.3 pg (ref 26.0–34.0)
MCHC: 32.3 g/dL (ref 30.0–36.0)
MCV: 96.7 fL (ref 80.0–100.0)
Monocytes Absolute: 0.4 10*3/uL (ref 0.1–1.0)
Monocytes Relative: 7 %
Neutro Abs: 4.7 10*3/uL (ref 1.7–7.7)
Neutrophils Relative %: 70 %
Platelets: 208 10*3/uL (ref 150–400)
RBC: 2.75 MIL/uL — ABNORMAL LOW (ref 3.87–5.11)
RDW: 13.9 % (ref 11.5–15.5)
WBC: 6.6 10*3/uL (ref 4.0–10.5)
nRBC: 0 % (ref 0.0–0.2)

## 2021-04-23 LAB — IRON AND TIBC
Iron: 56 ug/dL (ref 28–170)
Saturation Ratios: 16 % (ref 10.4–31.8)
TIBC: 360 ug/dL (ref 250–450)
UIBC: 304 ug/dL

## 2021-04-23 LAB — FERRITIN: Ferritin: 14 ng/mL (ref 11–307)

## 2021-04-23 NOTE — Progress Notes (Signed)
Pt has no new concerns at this time. 

## 2021-04-30 DIAGNOSIS — E611 Iron deficiency: Secondary | ICD-10-CM | POA: Diagnosis not present

## 2021-04-30 DIAGNOSIS — E782 Mixed hyperlipidemia: Secondary | ICD-10-CM | POA: Diagnosis not present

## 2021-04-30 DIAGNOSIS — E1169 Type 2 diabetes mellitus with other specified complication: Secondary | ICD-10-CM | POA: Diagnosis not present

## 2021-04-30 DIAGNOSIS — E785 Hyperlipidemia, unspecified: Secondary | ICD-10-CM | POA: Diagnosis not present

## 2021-05-01 DIAGNOSIS — I350 Nonrheumatic aortic (valve) stenosis: Secondary | ICD-10-CM | POA: Diagnosis not present

## 2021-05-01 DIAGNOSIS — E119 Type 2 diabetes mellitus without complications: Secondary | ICD-10-CM | POA: Diagnosis not present

## 2021-05-01 DIAGNOSIS — I10 Essential (primary) hypertension: Secondary | ICD-10-CM | POA: Diagnosis not present

## 2021-05-01 DIAGNOSIS — K219 Gastro-esophageal reflux disease without esophagitis: Secondary | ICD-10-CM | POA: Diagnosis not present

## 2021-05-01 DIAGNOSIS — N183 Chronic kidney disease, stage 3 unspecified: Secondary | ICD-10-CM | POA: Diagnosis not present

## 2021-05-01 DIAGNOSIS — I5032 Chronic diastolic (congestive) heart failure: Secondary | ICD-10-CM | POA: Diagnosis not present

## 2021-05-01 DIAGNOSIS — E782 Mixed hyperlipidemia: Secondary | ICD-10-CM | POA: Diagnosis not present

## 2021-05-07 DIAGNOSIS — E119 Type 2 diabetes mellitus without complications: Secondary | ICD-10-CM | POA: Diagnosis not present

## 2021-05-07 DIAGNOSIS — I10 Essential (primary) hypertension: Secondary | ICD-10-CM | POA: Diagnosis not present

## 2021-05-07 DIAGNOSIS — Z1389 Encounter for screening for other disorder: Secondary | ICD-10-CM | POA: Diagnosis not present

## 2021-05-07 DIAGNOSIS — Z0000 Encounter for general adult medical examination without abnormal findings: Secondary | ICD-10-CM | POA: Diagnosis not present

## 2021-05-07 DIAGNOSIS — D509 Iron deficiency anemia, unspecified: Secondary | ICD-10-CM | POA: Diagnosis not present

## 2021-05-07 DIAGNOSIS — E785 Hyperlipidemia, unspecified: Secondary | ICD-10-CM | POA: Diagnosis not present

## 2021-05-09 ENCOUNTER — Inpatient Hospital Stay: Payer: Medicare HMO

## 2021-05-09 ENCOUNTER — Other Ambulatory Visit: Payer: Self-pay

## 2021-05-09 ENCOUNTER — Other Ambulatory Visit: Payer: Self-pay | Admitting: *Deleted

## 2021-05-09 ENCOUNTER — Encounter: Payer: Self-pay | Admitting: Oncology

## 2021-05-09 ENCOUNTER — Inpatient Hospital Stay: Payer: Medicare HMO | Admitting: Oncology

## 2021-05-09 VITALS — BP 128/50 | HR 85 | Temp 97.9°F | Wt 140.0 lb

## 2021-05-09 DIAGNOSIS — D509 Iron deficiency anemia, unspecified: Secondary | ICD-10-CM

## 2021-05-09 DIAGNOSIS — Z87891 Personal history of nicotine dependence: Secondary | ICD-10-CM | POA: Diagnosis not present

## 2021-05-09 DIAGNOSIS — D649 Anemia, unspecified: Secondary | ICD-10-CM

## 2021-05-09 LAB — CBC WITH DIFFERENTIAL/PLATELET
Abs Immature Granulocytes: 0.02 10*3/uL (ref 0.00–0.07)
Basophils Absolute: 0.1 10*3/uL (ref 0.0–0.1)
Basophils Relative: 1 %
Eosinophils Absolute: 0.2 10*3/uL (ref 0.0–0.5)
Eosinophils Relative: 3 %
HCT: 23.4 % — ABNORMAL LOW (ref 36.0–46.0)
Hemoglobin: 7.2 g/dL — ABNORMAL LOW (ref 12.0–15.0)
Immature Granulocytes: 0 %
Lymphocytes Relative: 16 %
Lymphs Abs: 1 10*3/uL (ref 0.7–4.0)
MCH: 31.4 pg (ref 26.0–34.0)
MCHC: 30.8 g/dL (ref 30.0–36.0)
MCV: 102.2 fL — ABNORMAL HIGH (ref 80.0–100.0)
Monocytes Absolute: 0.4 10*3/uL (ref 0.1–1.0)
Monocytes Relative: 6 %
Neutro Abs: 4.4 10*3/uL (ref 1.7–7.7)
Neutrophils Relative %: 74 %
Platelets: 218 10*3/uL (ref 150–400)
RBC: 2.29 MIL/uL — ABNORMAL LOW (ref 3.87–5.11)
RDW: 15.3 % (ref 11.5–15.5)
WBC: 6.1 10*3/uL (ref 4.0–10.5)
nRBC: 0 % (ref 0.0–0.2)

## 2021-05-09 LAB — FERRITIN: Ferritin: 9 ng/mL — ABNORMAL LOW (ref 11–307)

## 2021-05-09 LAB — IRON AND TIBC
Iron: 49 ug/dL (ref 28–170)
Saturation Ratios: 13 % (ref 10.4–31.8)
TIBC: 389 ug/dL (ref 250–450)
UIBC: 340 ug/dL

## 2021-05-09 LAB — PREPARE RBC (CROSSMATCH)

## 2021-05-09 NOTE — Progress Notes (Signed)
[name]  [phone] [phone]) [phone]  ID: Lisa Mcneil OB: 25-Feb-1928  MR#: [id number]  [id number]  Patient Care Team: Dion Body, MD as PCP - General (Family Medicine) Lloyd Huger, MD as Consulting Physician (Hematology and Oncology)  CHIEF COMPLAINT:  Iron deficiency anemia.  INTERVAL HISTORY: Lisa Mcneil is a 87 year old female with past medical history significant for hypertension, CHF, GERD, diabetes, CKD stage III, hyperlipidemia who is followed by Dr. Grayland Ormond for iron deficiency anemia.  She receives intermittent IV Venofer last given from 01/14/2021 to 01/25/2021.  She has been seen several times in the emergency room for various concerns.  Hospitalized from 11/28/20-11/29/2020 for acute CHF exacerbation and treated with Lasix.  She was evaluated on 01/03/2020 for abnormal labs and weakness.  Her hemoglobin was found to be 7.2.  She was given a unit of packed red blood cells with improvement of her symptoms.  She was seen for a routine visit with Dr. Grayland Ormond and lab work was stable.  About 5 to 6 days ago she started to become more short of breath with exertion and has been more tired.  She decided she had her labs checked again.  Denies any rectal bleeding.  She does have dark stools secondary to iron supplements.    REVIEW OF SYSTEMS:   Review of Systems  Constitutional:  Positive for malaise/fatigue. Negative for chills, fever and weight loss.  HENT:  Negative for congestion, ear pain and tinnitus.   Eyes: Negative.  Negative for blurred vision and double vision.  Respiratory:  Positive for shortness of breath. Negative for cough and sputum production.   Cardiovascular: Negative.  Negative for chest pain, palpitations and leg swelling.  Gastrointestinal: Negative.  Negative for abdominal pain, constipation, diarrhea, nausea and vomiting.  Genitourinary:  Negative for dysuria, frequency and urgency.   Musculoskeletal:  Negative for back pain and falls.  Skin: Negative.  Negative for rash.  Neurological: Negative.  Negative for weakness and headaches.  Endo/Heme/Allergies: Negative.  Does not bruise/bleed easily.  Psychiatric/Behavioral: Negative.  Negative for depression. The patient is not nervous/anxious and does not have insomnia.    As per HPI. Otherwise, a complete review of systems is negative.  PAST MEDICAL HISTORY: Past Medical History:  Diagnosis Date   CHF (congestive heart failure) (HCC)    Diabetes mellitus without complication (HCC)    GERD (gastroesophageal reflux disease)    Hypertension    Insomnia    Iron deficiency anemia    RLS (restless legs syndrome)     PAST SURGICAL HISTORY: Past Surgical History:  Procedure Laterality Date   BLADDER REPAIR     CATARACT EXTRACTION W/PHACO Right 05/13/2016   Procedure: CATARACT EXTRACTION PHACO AND INTRAOCULAR LENS PLACEMENT ([place]);  Surgeon: Birder Robson, MD;  Location: [facility] ORS;  Service: Ophthalmology;  Laterality: Right;  [place] 1.09 AP% 21.0 CDE 14.60 Fluid Pack Lot # [id number]   CATARACT EXTRACTION W/PHACO Left 07/15/2016   Procedure: CATARACT EXTRACTION PHACO AND INTRAOCULAR LENS PLACEMENT ([facility]);  Surgeon: Birder Robson, MD;  Location: [facility] ORS;  Service: Ophthalmology;  Laterality: Left;  Lot# [id number] H [place]: 01:17.9 AP%:27.8 CDE: 21.67     FAMILY HISTORY: Reviewed and unchanged. No reported history of malignancy or chronic disease.     ADVANCED DIRECTIVES:    HEALTH MAINTENANCE: Social History   Tobacco Use   Smoking status: Former   Smokeless tobacco: Never  Scientific laboratory technician Use: Never used  Substance Use Topics   Alcohol use: No  Drug use: No     Colonoscopy:  PAP:  Bone density:  Lipid panel:  Allergies  Allergen Reactions   Codeine    Demerol [Meperidine]    Polysaccharide Iron Complex Other (See Comments)    Caused constipation   Naproxen Rash    Caused rash on mouth     Current Outpatient Medications  Medication Sig Dispense Refill   amLODipine (NORVASC) 10 MG tablet Take 10 mg by mouth daily.     Calcium Carbonate-Vit D-Min (CALTRATE 600+D PLUS MINERALS) 600-800 MG-UNIT CHEW Chew 1 tablet by mouth as directed.     ferrous sulfate 325 (65 FE) MG tablet Take 325 mg by mouth daily.     furosemide (LASIX) 20 MG tablet Take 20 mg by mouth daily as needed. Take 1 daily as needed for edema     gabapentin (NEURONTIN) 100 MG capsule Take 100 mg by mouth at bedtime.     lisinopril-hydrochlorothiazide (ZESTORETIC) 20-12.5 MG tablet Take 1 tablet by mouth daily.     metFORMIN (GLUCOPHAGE) 500 MG tablet Take 500 mg by mouth 2 (two) times daily with a meal.     Multiple Vitamins-Minerals (MULTIVITAMIN WITH MINERALS) tablet Take 1 tablet by mouth daily.     omeprazole (PRILOSEC) 20 MG capsule Take 20 mg by mouth 2 (two) times daily.     pramipexole (MIRAPEX) 0.25 MG tablet Take 0.25 mg by mouth in the morning and at bedtime.     rosuvastatin (CRESTOR) 5 MG tablet Take 5 mg by mouth at bedtime.     traZODone (DESYREL) 50 MG tablet Take 50 mg by mouth at bedtime.     No current facility-administered medications for this visit.    OBJECTIVE: Vitals:   05/09/21 1323  BP: (!) 128/50  Pulse: 85  Temp: 97.9 F (36.6 C)  SpO2: 95%     Body mass index is 26.45 kg/m.    ECOG FS:0 - Asymptomatic  Physical Exam Constitutional:      Appearance: Normal appearance.  HENT:     Head: Normocephalic and atraumatic.  Eyes:     Pupils: Pupils are equal, round, and reactive to light.  Cardiovascular:     Rate and Rhythm: Normal rate and regular rhythm.     Heart sounds: Normal heart sounds. No murmur heard. Pulmonary:     Effort: Pulmonary effort is normal.     Breath sounds: Normal breath sounds. No wheezing.  Abdominal:     General: Bowel sounds are normal. There is no distension.     Palpations: Abdomen is soft.     Tenderness: There is no abdominal tenderness.   Musculoskeletal:        General: Normal range of motion.     Cervical back: Normal range of motion.  Skin:    General: Skin is warm and dry.     Findings: No rash.  Neurological:     Mental Status: She is alert and oriented to person, place, and time.  Psychiatric:        Judgment: Judgment normal.     LAB RESULTS:  Lab Results  Component Value Date   NA 136 01/02/2021   K 5.2 (H) 01/02/2021   CL 105 01/02/2021   CO2 22 01/02/2021   GLUCOSE 204 (H) 01/02/2021   BUN 37 (H) 01/02/2021   CREATININE 1.70 (H) 01/02/2021   CALCIUM 9.3 01/02/2021   PROT 7.8 02/16/2015   ALBUMIN 4.0 02/16/2015   AST 27 02/16/2015   ALT 18 02/16/2015  ALKPHOS 56 02/16/2015   BILITOT 0.3 02/16/2015   GFRNONAA 28 (L) 01/02/2021   GFRAA >60 02/17/2015    Lab Results  Component Value Date   WBC 6.1 05/09/2021   NEUTROABS 4.4 05/09/2021   HGB 7.2 (L) 05/09/2021   HCT 23.4 (L) 05/09/2021   MCV 102.2 (H) 05/09/2021   PLT 218 05/09/2021   Lab Results  Component Value Date   IRON 56 04/23/2021   TIBC 360 04/23/2021   IRONPCTSAT 16 04/23/2021   Lab Results  Component Value Date   FERRITIN 14 04/23/2021     STUDIES: No results found.  ASSESSMENT: Iron deficiency anemia d/t GI bleed  PLAN:    1. 1. Iron deficiency anemia/symptomatic anemia:  She was hospitalized in May for symptomatic anemia with hemoglobin of 7.2.  She was given 1 unit of packed red blood cells with improvement of her labs.  She received 5 doses of IV Venofer from 01/14/2021-01/25/2021.  Her labs continue to improve with hemoglobin at baseline at 8.6.  Labs from today show hemoglobin of 7.2 with a normal differential.  She denies any bleeding although her stools are dark due to iron supplements.  She is not interested in being evaluated by GI.  Recommend 1 unit PRBC as soon as possible.  She is scheduled to receive blood tomorrow morning.  Return to clinic in 10 to 14 days with repeat lab work CBC, ferritin, iron panel,  folate and vitamin B12 levels +/- blood/iron infusion.    Disposition: RTC tomorrow for 1 unit PRBC.  RTC in 10 to 14 days for repeat labs and possible iron/blood.  I spent 20 minutes dedicated to the care of this patient (face-to-face and non-face-to-face) on the date of the encounter to include what is described in the assessment and plan.  Patient expressed understanding and was in agreement with this plan. She also understands that She can call clinic at any time with any questions, concerns, or complaints.    Jacquelin Hawking, NP   05/09/2021 1:32 PM

## 2021-05-09 NOTE — Progress Notes (Signed)
Pt feeling weak, tired. Dyspnea with exertion. Felt fine when she saw MD then things changed. Pt continues to work 5 days a week. No visible blood in stools.

## 2021-05-10 ENCOUNTER — Inpatient Hospital Stay: Payer: Medicare HMO

## 2021-05-10 DIAGNOSIS — D649 Anemia, unspecified: Secondary | ICD-10-CM

## 2021-05-10 DIAGNOSIS — D509 Iron deficiency anemia, unspecified: Secondary | ICD-10-CM

## 2021-05-10 DIAGNOSIS — Z87891 Personal history of nicotine dependence: Secondary | ICD-10-CM | POA: Diagnosis not present

## 2021-05-10 MED ORDER — ACETAMINOPHEN 325 MG PO TABS
650.0000 mg | ORAL_TABLET | Freq: Once | ORAL | Status: AC
  Administered 2021-05-10: 650 mg via ORAL
  Filled 2021-05-10: qty 2

## 2021-05-10 MED ORDER — DIPHENHYDRAMINE HCL 25 MG PO CAPS
25.0000 mg | ORAL_CAPSULE | Freq: Once | ORAL | Status: AC
  Administered 2021-05-10: 25 mg via ORAL
  Filled 2021-05-10: qty 1

## 2021-05-10 MED ORDER — SODIUM CHLORIDE 0.9% IV SOLUTION
250.0000 mL | Freq: Once | INTRAVENOUS | Status: AC
  Administered 2021-05-10: 250 mL via INTRAVENOUS
  Filled 2021-05-10: qty 250

## 2021-05-10 NOTE — Patient Instructions (Signed)
Blood Transfusion, Adult A blood transfusion is a procedure in which you receive blood or a type of blood cell (blood component) through an IV. You may need a blood transfusion when your blood level is low. This may result from a bleeding disorder, illness, injury, or surgery. The blood may come from a donor. You may also be able to donate blood for yourself (autologous blood donation) before a planned surgery. The blood given in a transfusion is made up of different blood components. You may receive: Red blood cells. These carry oxygen to the cells in the body. Platelets. These help your blood to clot. Plasma. This is the liquid part of your blood. It carries proteins and other substances throughout the body. White blood cells. These help you fight infections. If you have hemophilia or another clotting disorder, you may also receive other types of blood products. Tell a health care provider about: Any blood disorders you have. Any previous reactions you have had during a blood transfusion. Any allergies you have. All medicines you are taking, including vitamins, herbs, eye drops, creams, and over-the-counter medicines. Any surgeries you have had. Any medical conditions you have, including any recent fever or cold symptoms. Whether you are pregnant or may be pregnant. What are the risks? Generally, this is a safe procedure. However, problems may occur. The most common problems include: A mild allergic reaction, such as red, swollen areas of skin (hives) and itching. Fever or chills. This may be the body's response to new blood cells received. This may occur during or up to 4 hours after the transfusion. More serious problems may include: Transfusion-associated circulatory overload (TACO), or too much fluid in the lungs. This may cause breathing problems. A serious allergic reaction, such as difficulty breathing or swelling around the face and lips. Transfusion-related acute lung injury  (TRALI), which causes breathing difficulty and low oxygen in the blood. This can occur within hours of the transfusion or several days later. Iron overload. This can happen after receiving many blood transfusions over a period of time. Infection or virus being transmitted. This is rare because donated blood is carefully tested before it is given. Hemolytic transfusion reaction. This is rare. It happens when your body's defense system (immune system)tries to attack the new blood cells. Symptoms may include fever, chills, nausea, low blood pressure, and low back or chest pain. Transfusion-associated graft-versus-host disease (TAGVHD). This is rare. It happens when donated cells attack your body's healthy tissues. What happens before the procedure? Medicines Ask your health care provider about: Changing or stopping your regular medicines. This is especially important if you are taking diabetes medicines or blood thinners. Taking medicines such as aspirin and ibuprofen. These medicines can thin your blood. Do not take these medicines unless your health care provider tells you to take them. Taking over-the-counter medicines, vitamins, herbs, and supplements. General instructions Follow instructions from your health care provider about eating and drinking restrictions. You will have a blood test to determine your blood type. This is necessary to know what kind of blood your body will accept and to match it to the donor blood. If you are going to have a planned surgery, you may be able to do an autologous blood donation. This may be done in case you need to have a transfusion. You will have your temperature, blood pressure, and pulse monitored before the transfusion. If you have had an allergic reaction to a transfusion in the past, you may be given medicine to help prevent   a reaction. This medicine may be given to you by mouth (orally) or through an IV. Set aside time for the blood transfusion. This  procedure generally takes 1-4 hours to complete. What happens during the procedure?  An IV will be inserted into one of your veins. The bag of donated blood will be attached to your IV. The blood will then enter through your vein. Your temperature, blood pressure, and pulse will be monitored regularly during the transfusion. This monitoring is done to detect early signs of a transfusion reaction. Tell your nurse right away if you have any of these symptoms during the transfusion: Shortness of breath or trouble breathing. Chest or back pain. Fever or chills. Hives or itching. If you have any signs or symptoms of a reaction, your transfusion will be stopped and you may be given medicine. When the transfusion is complete, your IV will be removed. Pressure may be applied to the IV site for a few minutes. A bandage (dressing)will be applied. The procedure may vary among health care providers and hospitals. What happens after the procedure? Your temperature, blood pressure, pulse, breathing rate, and blood oxygen level will be monitored until you leave the hospital or clinic. Your blood may be tested to see how you are responding to the transfusion. You may be warmed with fluids or blankets to maintain a normal body temperature. If you receive your blood transfusion in an outpatient setting, you will be told whom to contact to report any reactions. Where to find more information For more information on blood transfusions, visit the American Red Cross: redcross.org Summary A blood transfusion is a procedure in which you receive blood or a type of blood cell (blood component) through an IV. The blood you receive may come from a donor or be donated by yourself (autologous blood donation) before a planned surgery. The blood given in a transfusion is made up of different blood components. You may receive red blood cells, platelets, plasma, or white blood cells depending on the condition treated. Your  temperature, blood pressure, and pulse will be monitored before, during, and after the transfusion. After the transfusion, your blood may be tested to see how your body has responded. This information is not intended to replace advice given to you by your health care provider. Make sure you discuss any questions you have with your health care provider. Document Revised: 06/02/2019 Document Reviewed: 01/20/2019 Elsevier Patient Education  2022 Elsevier Inc.  

## 2021-05-12 LAB — BPAM RBC
Blood Product Expiration Date: 202210292359
ISSUE DATE / TIME: 202209300853
Unit Type and Rh: 6200

## 2021-05-12 LAB — TYPE AND SCREEN
ABO/RH(D): A POS
Antibody Screen: NEGATIVE
Unit division: 0

## 2021-05-22 ENCOUNTER — Other Ambulatory Visit: Payer: Self-pay

## 2021-05-22 DIAGNOSIS — D509 Iron deficiency anemia, unspecified: Secondary | ICD-10-CM

## 2021-05-23 ENCOUNTER — Inpatient Hospital Stay: Payer: Medicare HMO | Attending: Oncology

## 2021-05-23 ENCOUNTER — Other Ambulatory Visit: Payer: Self-pay

## 2021-05-23 DIAGNOSIS — D509 Iron deficiency anemia, unspecified: Secondary | ICD-10-CM | POA: Diagnosis not present

## 2021-05-23 DIAGNOSIS — Z87891 Personal history of nicotine dependence: Secondary | ICD-10-CM | POA: Insufficient documentation

## 2021-05-23 LAB — CBC WITH DIFFERENTIAL/PLATELET
Abs Immature Granulocytes: 0.02 10*3/uL (ref 0.00–0.07)
Basophils Absolute: 0 10*3/uL (ref 0.0–0.1)
Basophils Relative: 1 %
Eosinophils Absolute: 0.2 10*3/uL (ref 0.0–0.5)
Eosinophils Relative: 3 %
HCT: 22.8 % — ABNORMAL LOW (ref 36.0–46.0)
Hemoglobin: 7.2 g/dL — ABNORMAL LOW (ref 12.0–15.0)
Immature Granulocytes: 0 %
Lymphocytes Relative: 17 %
Lymphs Abs: 1.1 10*3/uL (ref 0.7–4.0)
MCH: 31 pg (ref 26.0–34.0)
MCHC: 31.6 g/dL (ref 30.0–36.0)
MCV: 98.3 fL (ref 80.0–100.0)
Monocytes Absolute: 0.3 10*3/uL (ref 0.1–1.0)
Monocytes Relative: 5 %
Neutro Abs: 4.8 10*3/uL (ref 1.7–7.7)
Neutrophils Relative %: 74 %
Platelets: 203 10*3/uL (ref 150–400)
RBC: 2.32 MIL/uL — ABNORMAL LOW (ref 3.87–5.11)
RDW: 13.9 % (ref 11.5–15.5)
WBC: 6.4 10*3/uL (ref 4.0–10.5)
nRBC: 0 % (ref 0.0–0.2)

## 2021-05-23 LAB — SAMPLE TO BLOOD BANK

## 2021-05-23 LAB — IRON AND TIBC
Iron: 38 ug/dL (ref 28–170)
Saturation Ratios: 10 % — ABNORMAL LOW (ref 10.4–31.8)
TIBC: 375 ug/dL (ref 250–450)
UIBC: 337 ug/dL

## 2021-05-23 LAB — FERRITIN: Ferritin: 15 ng/mL (ref 11–307)

## 2021-05-24 ENCOUNTER — Other Ambulatory Visit: Payer: Self-pay

## 2021-05-24 ENCOUNTER — Inpatient Hospital Stay: Payer: Medicare HMO

## 2021-05-24 ENCOUNTER — Other Ambulatory Visit: Payer: Self-pay | Admitting: Nurse Practitioner

## 2021-05-24 DIAGNOSIS — D509 Iron deficiency anemia, unspecified: Secondary | ICD-10-CM

## 2021-05-24 DIAGNOSIS — D649 Anemia, unspecified: Secondary | ICD-10-CM

## 2021-05-24 DIAGNOSIS — Z87891 Personal history of nicotine dependence: Secondary | ICD-10-CM | POA: Diagnosis not present

## 2021-05-24 LAB — PREPARE RBC (CROSSMATCH)

## 2021-05-24 MED ORDER — SODIUM CHLORIDE 0.9% IV SOLUTION
250.0000 mL | Freq: Once | INTRAVENOUS | Status: AC
  Administered 2021-05-24: 250 mL via INTRAVENOUS
  Filled 2021-05-24: qty 250

## 2021-05-24 MED ORDER — SODIUM CHLORIDE 0.9% FLUSH
10.0000 mL | INTRAVENOUS | Status: AC | PRN
  Administered 2021-05-24: 10 mL
  Filled 2021-05-24: qty 10

## 2021-05-24 NOTE — Patient Instructions (Signed)

## 2021-05-24 NOTE — Progress Notes (Signed)
Spoke to patient by phone. Hemoglobin 7.2. She would prefer to take blood today. Will send message to get her set up for iron infusions starting next week. She denies active bleeding and has chronic dark stools due to oral iron.

## 2021-05-24 NOTE — Progress Notes (Unsigned)
Patient here for possible blood transfusion today with labs being drawn yesterday (05/23/21).  Lauren A, NP would like patient to recieve 1 unit of blood today with f/u plan: blood today, 1 week lab (cbc, hold tube), venofer. 2 weeks- venofer, 3 weeks venofer, 4 weeks venofer, 5 weeks venofer. 8 weeks (cbc, hold tube), see provider

## 2021-05-25 LAB — TYPE AND SCREEN
ABO/RH(D): A POS
Antibody Screen: NEGATIVE
Unit division: 0

## 2021-05-25 LAB — BPAM RBC
Blood Product Expiration Date: 202211072359
ISSUE DATE / TIME: 202210141051
Unit Type and Rh: 6200

## 2021-05-30 ENCOUNTER — Inpatient Hospital Stay: Payer: Medicare HMO

## 2021-05-30 ENCOUNTER — Other Ambulatory Visit: Payer: Self-pay

## 2021-05-30 VITALS — BP 110/39 | HR 70 | Temp 96.4°F | Resp 17

## 2021-05-30 DIAGNOSIS — D509 Iron deficiency anemia, unspecified: Secondary | ICD-10-CM | POA: Diagnosis not present

## 2021-05-30 DIAGNOSIS — D508 Other iron deficiency anemias: Secondary | ICD-10-CM

## 2021-05-30 DIAGNOSIS — Z87891 Personal history of nicotine dependence: Secondary | ICD-10-CM | POA: Diagnosis not present

## 2021-05-30 MED ORDER — IRON SUCROSE 20 MG/ML IV SOLN
200.0000 mg | Freq: Once | INTRAVENOUS | Status: AC
  Administered 2021-05-30: 200 mg via INTRAVENOUS
  Filled 2021-05-30: qty 10

## 2021-05-30 MED ORDER — SODIUM CHLORIDE 0.9 % IV SOLN
Freq: Once | INTRAVENOUS | Status: AC
  Filled 2021-05-30: qty 250

## 2021-05-30 MED ORDER — SODIUM CHLORIDE 0.9 % IV SOLN: 200.0000 mg | Freq: Once | INTRAVENOUS | Status: DC

## 2021-05-30 NOTE — Patient Instructions (Signed)

## 2021-06-06 ENCOUNTER — Other Ambulatory Visit: Payer: Self-pay

## 2021-06-06 ENCOUNTER — Inpatient Hospital Stay: Payer: Medicare HMO

## 2021-06-06 VITALS — BP 130/43 | HR 66 | Resp 17

## 2021-06-06 DIAGNOSIS — Z87891 Personal history of nicotine dependence: Secondary | ICD-10-CM | POA: Diagnosis not present

## 2021-06-06 DIAGNOSIS — D508 Other iron deficiency anemias: Secondary | ICD-10-CM

## 2021-06-06 DIAGNOSIS — D509 Iron deficiency anemia, unspecified: Secondary | ICD-10-CM | POA: Diagnosis not present

## 2021-06-06 MED ORDER — IRON SUCROSE 20 MG/ML IV SOLN
200.0000 mg | Freq: Once | INTRAVENOUS | Status: AC
  Administered 2021-06-06: 200 mg via INTRAVENOUS
  Filled 2021-06-06: qty 10

## 2021-06-06 MED ORDER — SODIUM CHLORIDE 0.9 % IV SOLN: 200.0000 mg | Freq: Once | INTRAVENOUS | Status: DC

## 2021-06-06 MED ORDER — SODIUM CHLORIDE 0.9 % IV SOLN
INTRAVENOUS | Status: DC
  Filled 2021-06-06: qty 250

## 2021-06-06 NOTE — Patient Instructions (Signed)
[facility] [place] [facility]  Discharge Instructions: Thank you for choosing [facility] to provide your oncology and hematology care.  If you have a lab appointment with the Cancer Center, please go directly to the Cancer Center and check in at the registration area.  Wear comfortable clothing and clothing appropriate for easy access to any Portacath or PICC line.   We strive to give you quality time with your provider. You may need to reschedule your appointment if you arrive late (15 or more minutes).  Arriving late affects you and other patients whose appointments are after yours.  Also, if you miss three or more appointments without notifying the office, you may be dismissed from the clinic at the provider's discretion.      For prescription refill requests, have your pharmacy contact our office and allow 72 hours for refills to be completed.    Today you received the following : Venofer   To help prevent nausea and vomiting after your treatment, we encourage you to take your nausea medication as directed.  BELOW ARE SYMPTOMS THAT SHOULD BE REPORTED IMMEDIATELY: . *FEVER GREATER THAN 100.4 F (38 C) OR HIGHER . *CHILLS OR SWEATING . *NAUSEA AND VOMITING THAT IS NOT CONTROLLED WITH YOUR NAUSEA MEDICATION . *UNUSUAL SHORTNESS OF BREATH . *UNUSUAL BRUISING OR BLEEDING . *URINARY PROBLEMS (pain or burning when urinating, or frequent urination) . *BOWEL PROBLEMS (unusual diarrhea, constipation, pain near the anus) . TENDERNESS IN MOUTH AND THROAT WITH OR WITHOUT PRESENCE OF ULCERS (sore throat, sores in mouth, or a toothache) . UNUSUAL RASH, SWELLING OR PAIN  . UNUSUAL VAGINAL DISCHARGE OR ITCHING   Items with * indicate a potential emergency and should be followed up as soon as possible or go to the Emergency Department if any problems should occur.  Please show the CHEMOTHERAPY ALERT CARD or IMMUNOTHERAPY ALERT CARD at check-in to the Emergency  Department and triage nurse.  Should you have questions after your visit or need to cancel or reschedule your appointment, please contact [facility] [place] [facility]  [phone] and follow the prompts.  Office hours are 8:00 a.m. to 4:30 p.m. Monday - Friday. Please note that voicemails left after 4:00 p.m. may not be returned until the following business day.  We are closed weekends and major holidays. You have access to a nurse at all times for urgent questions. Please call the main number to the clinic [phone] and follow the prompts.  For any non-urgent questions, you may also contact your provider using MyChart. We now offer e-Visits for anyone 18 and older to request care online for non-urgent symptoms. For details visit [email or web address].   Also download the MyChart app! Go to the app store, search "MyChart", open the app, select [facility], and log in with your MyChart username and password.  Due to Covid, a mask is required upon entering the hospital/clinic. If you do not have a mask, one will be given to you upon arrival. For doctor visits, patients may have 1 support person aged 18 or older with them. For treatment visits, patients cannot have anyone with them due to current Covid guidelines and our immunocompromised population.  

## 2021-06-12 ENCOUNTER — Other Ambulatory Visit: Payer: Self-pay | Admitting: Nephrology

## 2021-06-12 DIAGNOSIS — R8290 Unspecified abnormal findings in urine: Secondary | ICD-10-CM

## 2021-06-12 DIAGNOSIS — D631 Anemia in chronic kidney disease: Secondary | ICD-10-CM | POA: Insufficient documentation

## 2021-06-12 DIAGNOSIS — I129 Hypertensive chronic kidney disease with stage 1 through stage 4 chronic kidney disease, or unspecified chronic kidney disease: Secondary | ICD-10-CM

## 2021-06-12 DIAGNOSIS — N1832 Chronic kidney disease, stage 3b: Secondary | ICD-10-CM | POA: Diagnosis not present

## 2021-06-12 DIAGNOSIS — E1122 Type 2 diabetes mellitus with diabetic chronic kidney disease: Secondary | ICD-10-CM | POA: Diagnosis not present

## 2021-06-12 DIAGNOSIS — N189 Chronic kidney disease, unspecified: Secondary | ICD-10-CM

## 2021-06-13 ENCOUNTER — Inpatient Hospital Stay: Payer: Medicare HMO | Attending: Oncology

## 2021-06-13 ENCOUNTER — Other Ambulatory Visit: Payer: Self-pay

## 2021-06-13 VITALS — BP 120/43 | HR 75 | Temp 96.4°F | Resp 17

## 2021-06-13 DIAGNOSIS — Z87891 Personal history of nicotine dependence: Secondary | ICD-10-CM | POA: Insufficient documentation

## 2021-06-13 DIAGNOSIS — D508 Other iron deficiency anemias: Secondary | ICD-10-CM

## 2021-06-13 DIAGNOSIS — D509 Iron deficiency anemia, unspecified: Secondary | ICD-10-CM | POA: Insufficient documentation

## 2021-06-13 MED ORDER — SODIUM CHLORIDE 0.9 % IV SOLN
Freq: Once | INTRAVENOUS | Status: AC
  Filled 2021-06-13: qty 250

## 2021-06-13 MED ORDER — IRON SUCROSE 20 MG/ML IV SOLN
200.0000 mg | Freq: Once | INTRAVENOUS | Status: AC
  Administered 2021-06-13: 200 mg via INTRAVENOUS
  Filled 2021-06-13: qty 10

## 2021-06-13 MED ORDER — SODIUM CHLORIDE 0.9 % IV SOLN: 200.0000 mg | Freq: Once | INTRAVENOUS | Status: DC

## 2021-06-13 NOTE — Patient Instructions (Signed)

## 2021-06-20 ENCOUNTER — Other Ambulatory Visit: Payer: Self-pay

## 2021-06-20 ENCOUNTER — Inpatient Hospital Stay: Payer: Medicare HMO

## 2021-06-20 VITALS — BP 122/42 | HR 72 | Temp 96.0°F | Resp 18

## 2021-06-20 DIAGNOSIS — D509 Iron deficiency anemia, unspecified: Secondary | ICD-10-CM | POA: Diagnosis not present

## 2021-06-20 DIAGNOSIS — D508 Other iron deficiency anemias: Secondary | ICD-10-CM

## 2021-06-20 DIAGNOSIS — Z87891 Personal history of nicotine dependence: Secondary | ICD-10-CM | POA: Diagnosis not present

## 2021-06-20 MED ORDER — SODIUM CHLORIDE 0.9 % IV SOLN: 200.0000 mg | Freq: Once | INTRAVENOUS | Status: DC

## 2021-06-20 MED ORDER — SODIUM CHLORIDE 0.9 % IV SOLN
Freq: Once | INTRAVENOUS | Status: AC
  Filled 2021-06-20: qty 250

## 2021-06-20 MED ORDER — IRON SUCROSE 20 MG/ML IV SOLN
200.0000 mg | Freq: Once | INTRAVENOUS | Status: AC
  Administered 2021-06-20: 200 mg via INTRAVENOUS
  Filled 2021-06-20: qty 10

## 2021-06-20 NOTE — Patient Instructions (Signed)

## 2021-06-27 ENCOUNTER — Inpatient Hospital Stay: Payer: Medicare HMO

## 2021-06-27 ENCOUNTER — Other Ambulatory Visit: Payer: Self-pay

## 2021-06-27 VITALS — BP 130/46 | HR 74 | Temp 96.3°F | Resp 18

## 2021-06-27 DIAGNOSIS — D508 Other iron deficiency anemias: Secondary | ICD-10-CM

## 2021-06-27 DIAGNOSIS — Z87891 Personal history of nicotine dependence: Secondary | ICD-10-CM | POA: Diagnosis not present

## 2021-06-27 DIAGNOSIS — D509 Iron deficiency anemia, unspecified: Secondary | ICD-10-CM | POA: Diagnosis not present

## 2021-06-27 MED ORDER — SODIUM CHLORIDE 0.9 % IV SOLN: 200.0000 mg | Freq: Once | INTRAVENOUS | Status: DC

## 2021-06-27 MED ORDER — IRON SUCROSE 20 MG/ML IV SOLN
200.0000 mg | Freq: Once | INTRAVENOUS | Status: AC
  Administered 2021-06-27: 14:00:00 200 mg via INTRAVENOUS
  Filled 2021-06-27: qty 10

## 2021-06-27 MED ORDER — SODIUM CHLORIDE 0.9 % IV SOLN
Freq: Once | INTRAVENOUS | Status: AC
  Filled 2021-06-27: qty 250

## 2021-06-27 NOTE — Patient Instructions (Signed)
[facility] [place] [facility]   Discharge Instructions: Thank you for choosing [facility] to provide your oncology and hematology care.  If you have a lab appointment with the Cancer Center, please go directly to the Cancer Center and check in at the registration area.  We strive to give you quality time with your provider. You may need to reschedule your appointment if you arrive late (15 or more minutes).  Arriving late affects you and other patients whose appointments are after yours.  Also, if you miss three or more appointments without notifying the office, you may be dismissed from the clinic at the provider's discretion.      For prescription refill requests, have your pharmacy contact our office and allow 72 hours for refills to be completed.    Today you received the following: Venofer.      BELOW ARE SYMPTOMS THAT SHOULD BE REPORTED IMMEDIATELY: *FEVER GREATER THAN 100.4 F (38 C) OR HIGHER *CHILLS OR SWEATING *NAUSEA AND VOMITING THAT IS NOT CONTROLLED WITH YOUR NAUSEA MEDICATION *UNUSUAL SHORTNESS OF BREATH *UNUSUAL BRUISING OR BLEEDING *URINARY PROBLEMS (pain or burning when urinating, or frequent urination) *BOWEL PROBLEMS (unusual diarrhea, constipation, pain near the anus) TENDERNESS IN MOUTH AND THROAT WITH OR WITHOUT PRESENCE OF ULCERS (sore throat, sores in mouth, or a toothache) UNUSUAL RASH, SWELLING OR PAIN  UNUSUAL VAGINAL DISCHARGE OR ITCHING   Items with * indicate a potential emergency and should be followed up as soon as possible or go to the Emergency Department if any problems should occur.  Should you have questions after your visit or need to cancel or reschedule your appointment, please contact [facility] [place] [facility]  [phone] and follow the prompts.  Office hours are 8:00 a.m. to 4:30 p.m. Monday - Friday. Please note that voicemails left after 4:00 p.m. may not be returned until the following  business day.  We are closed weekends and major holidays. You have access to a nurse at all times for urgent questions. Please call the main number to the clinic [phone] and follow the prompts.  For any non-urgent questions, you may also contact your provider using MyChart. We now offer e-Visits for anyone 18 and older to request care online for non-urgent symptoms. For details visit [email or web address].   Also download the MyChart app! Go to the app store, search "MyChart", open the app, select [facility], and log in with your MyChart username and password.  Due to Covid, a mask is required upon entering the hospital/clinic. If you do not have a mask, one will be given to you upon arrival. For doctor visits, patients may have 1 support person aged 18 or older with them. For treatment visits, patients cannot have anyone with them due to current Covid guidelines and our immunocompromised population.  

## 2021-07-25 DIAGNOSIS — D631 Anemia in chronic kidney disease: Secondary | ICD-10-CM | POA: Diagnosis not present

## 2021-07-25 DIAGNOSIS — N2581 Secondary hyperparathyroidism of renal origin: Secondary | ICD-10-CM | POA: Insufficient documentation

## 2021-07-25 DIAGNOSIS — N1832 Chronic kidney disease, stage 3b: Secondary | ICD-10-CM | POA: Diagnosis not present

## 2021-07-25 DIAGNOSIS — E1122 Type 2 diabetes mellitus with diabetic chronic kidney disease: Secondary | ICD-10-CM | POA: Diagnosis not present

## 2021-07-25 DIAGNOSIS — I129 Hypertensive chronic kidney disease with stage 1 through stage 4 chronic kidney disease, or unspecified chronic kidney disease: Secondary | ICD-10-CM | POA: Diagnosis not present

## 2021-08-22 ENCOUNTER — Other Ambulatory Visit: Payer: Self-pay | Admitting: *Deleted

## 2021-08-22 DIAGNOSIS — D509 Iron deficiency anemia, unspecified: Secondary | ICD-10-CM

## 2021-08-23 ENCOUNTER — Other Ambulatory Visit: Payer: Medicare HMO

## 2021-08-23 ENCOUNTER — Ambulatory Visit: Payer: Medicare HMO

## 2021-08-23 ENCOUNTER — Ambulatory Visit: Payer: Medicare HMO | Admitting: Oncology

## 2021-08-26 ENCOUNTER — Ambulatory Visit: Payer: Medicare HMO

## 2021-08-26 ENCOUNTER — Ambulatory Visit: Payer: Medicare HMO | Admitting: Oncology

## 2021-08-26 ENCOUNTER — Other Ambulatory Visit: Payer: Medicare HMO

## 2021-08-27 ENCOUNTER — Ambulatory Visit: Payer: Medicare HMO

## 2021-08-27 ENCOUNTER — Encounter: Payer: Self-pay | Admitting: Nurse Practitioner

## 2021-08-27 ENCOUNTER — Inpatient Hospital Stay: Payer: Medicare HMO

## 2021-08-27 ENCOUNTER — Ambulatory Visit: Payer: Medicare HMO | Admitting: Oncology

## 2021-08-27 ENCOUNTER — Inpatient Hospital Stay (HOSPITAL_BASED_OUTPATIENT_CLINIC_OR_DEPARTMENT_OTHER): Payer: Medicare HMO | Admitting: Nurse Practitioner

## 2021-08-27 ENCOUNTER — Other Ambulatory Visit: Payer: Self-pay

## 2021-08-27 ENCOUNTER — Inpatient Hospital Stay: Payer: Medicare HMO | Attending: Oncology

## 2021-08-27 VITALS — BP 143/46 | HR 80 | Temp 96.3°F | Resp 17 | Wt 137.0 lb

## 2021-08-27 DIAGNOSIS — Z87891 Personal history of nicotine dependence: Secondary | ICD-10-CM | POA: Insufficient documentation

## 2021-08-27 DIAGNOSIS — R5383 Other fatigue: Secondary | ICD-10-CM | POA: Diagnosis not present

## 2021-08-27 DIAGNOSIS — D508 Other iron deficiency anemias: Secondary | ICD-10-CM

## 2021-08-27 DIAGNOSIS — D509 Iron deficiency anemia, unspecified: Secondary | ICD-10-CM

## 2021-08-27 DIAGNOSIS — G2581 Restless legs syndrome: Secondary | ICD-10-CM | POA: Insufficient documentation

## 2021-08-27 LAB — CBC WITH DIFFERENTIAL/PLATELET
Abs Immature Granulocytes: 0.01 10*3/uL (ref 0.00–0.07)
Basophils Absolute: 0.1 10*3/uL (ref 0.0–0.1)
Basophils Relative: 1 %
Eosinophils Absolute: 0.3 10*3/uL (ref 0.0–0.5)
Eosinophils Relative: 4 %
HCT: 32.8 % — ABNORMAL LOW (ref 36.0–46.0)
Hemoglobin: 10.7 g/dL — ABNORMAL LOW (ref 12.0–15.0)
Immature Granulocytes: 0 %
Lymphocytes Relative: 20 %
Lymphs Abs: 1.4 10*3/uL (ref 0.7–4.0)
MCH: 30.6 pg (ref 26.0–34.0)
MCHC: 32.6 g/dL (ref 30.0–36.0)
MCV: 93.7 fL (ref 80.0–100.0)
Monocytes Absolute: 0.4 10*3/uL (ref 0.1–1.0)
Monocytes Relative: 5 %
Neutro Abs: 5.1 10*3/uL (ref 1.7–7.7)
Neutrophils Relative %: 70 %
Platelets: 216 10*3/uL (ref 150–400)
RBC: 3.5 MIL/uL — ABNORMAL LOW (ref 3.87–5.11)
RDW: 12.9 % (ref 11.5–15.5)
WBC: 7.2 10*3/uL (ref 4.0–10.5)
nRBC: 0 % (ref 0.0–0.2)

## 2021-08-27 LAB — IRON AND TIBC
Iron: 38 ug/dL (ref 28–170)
Saturation Ratios: 10 % — ABNORMAL LOW (ref 10.4–31.8)
TIBC: 379 ug/dL (ref 250–450)
UIBC: 341 ug/dL

## 2021-08-27 LAB — SAMPLE TO BLOOD BANK

## 2021-08-27 LAB — FERRITIN: Ferritin: 28 ng/mL (ref 11–307)

## 2021-08-27 MED ORDER — IRON SUCROSE 20 MG/ML IV SOLN
200.0000 mg | Freq: Once | INTRAVENOUS | Status: AC
  Administered 2021-08-27: 200 mg via INTRAVENOUS
  Filled 2021-08-27: qty 10

## 2021-08-27 MED ORDER — SODIUM CHLORIDE 0.9 % IV SOLN: 200.0000 mg | Freq: Once | INTRAVENOUS | Status: DC

## 2021-08-27 MED ORDER — SODIUM CHLORIDE 0.9 % IV SOLN
INTRAVENOUS | Status: DC
  Filled 2021-08-27: qty 250

## 2021-08-27 NOTE — Patient Instructions (Signed)
[facility] AT [place]  Discharge Instructions: Thank you for choosing [facility] to provide your oncology and hematology care.  If you have a lab appointment with the [facility], please go directly to the [facility] and check in at the registration area.  Wear comfortable clothing and clothing appropriate for easy access to any Portacath or PICC line.   We strive to give you quality time with your provider. You may need to reschedule your appointment if you arrive late (15 or more minutes).  Arriving late affects you and other patients whose appointments are after yours.  Also, if you miss three or more appointments without notifying the office, you may be dismissed from the clinic at the providers discretion.      For prescription refill requests, have your pharmacy contact our office and allow 72 hours for refills to be completed.    Today you received the following : Venofer    To help prevent nausea and vomiting after your treatment, we encourage you to take your nausea medication as directed.  BELOW ARE SYMPTOMS THAT SHOULD BE REPORTED IMMEDIATELY: *FEVER GREATER THAN 100.4 F (38 C) OR HIGHER *CHILLS OR SWEATING *NAUSEA AND VOMITING THAT IS NOT CONTROLLED WITH YOUR NAUSEA MEDICATION *UNUSUAL SHORTNESS OF BREATH *UNUSUAL BRUISING OR BLEEDING *URINARY PROBLEMS (pain or burning when urinating, or frequent urination) *BOWEL PROBLEMS (unusual diarrhea, constipation, pain near the anus) TENDERNESS IN MOUTH AND THROAT WITH OR WITHOUT PRESENCE OF ULCERS (sore throat, sores in mouth, or a toothache) UNUSUAL RASH, SWELLING OR PAIN  UNUSUAL VAGINAL DISCHARGE OR ITCHING   Items with * indicate a potential emergency and should be followed up as soon as possible or go to the Emergency Department if any problems should occur.  Please show the CHEMOTHERAPY ALERT CARD or IMMUNOTHERAPY ALERT CARD at check-in to the Emergency Department and triage  nurse.  Should you have questions after your visit or need to cancel or reschedule your appointment, please contact [facility] [place] AT [place]  [phone] and follow the prompts.  Office hours are 8:00 a.m. to 4:30 p.m. Monday - Friday. Please note that voicemails left after 4:00 p.m. may not be returned until the following business day.  We are closed weekends and major holidays. You have access to a nurse at all times for urgent questions. Please call the main number to the clinic [phone] and follow the prompts.  For any non-urgent questions, you may also contact your provider using MyChart. We now offer e-Visits for anyone 71 and older to request care online for non-urgent symptoms. For details visit [email or web address].   Also download the MyChart app! Go to the app store, search "MyChart", open the app, select [facility], and log in with your MyChart username and password.  Due to Covid, a mask is required upon entering the hospital/clinic. If you do not have a mask, one will be given to you upon arrival. For doctor visits, patients may have 1 support person aged 1 or older with them. For treatment visits, patients cannot have anyone with them due to current Covid guidelines and our immunocompromised population.

## 2021-08-27 NOTE — Progress Notes (Signed)
[facility]  Telephone:([phone] Fax:([phone]  ID: Eldora Napp OB: 02-06-1928  MR#: [id number]  PRF#:[id number]  Patient Care Team: Dion Body, MD as PCP - General (Family Medicine) Lloyd Huger, MD as Consulting Physician (Hematology and Oncology)  CHIEF COMPLAINT:  Iron deficiency anemia.  INTERVAL HISTORY: Patient is a 88 year old female with past medical history significant for hypertension, CHF, GERD, diabetes, CKD stage III, hyperlipidemia, who returns to clinic for repeat labs and consideration of IV Venofer for history of iron deficiency anemia.  She continues to deny active bleeding, black or bloody stools.  She feels well and denies complaints.   REVIEW OF SYSTEMS:   Review of Systems  Constitutional:  Positive for malaise/fatigue. Negative for chills, fever and weight loss.  HENT:  Negative for congestion, ear pain and tinnitus.   Eyes:  Negative for blurred vision and double vision.  Respiratory:  Negative for cough, sputum production and shortness of breath.   Cardiovascular:  Negative for chest pain, palpitations and leg swelling.  Gastrointestinal:  Negative for abdominal pain, constipation, diarrhea, nausea and vomiting.  Genitourinary:  Negative for dysuria, frequency and urgency.  Musculoskeletal:  Negative for back pain and falls.  Skin:  Negative for rash.  Neurological:  Negative for weakness and headaches.  Endo/Heme/Allergies:  Does not bruise/bleed easily.  Psychiatric/Behavioral:  Negative for depression. The patient is not nervous/anxious and does not have insomnia.    As per HPI. Otherwise, a complete review of systems is negative.  PAST MEDICAL HISTORY: Past Medical History:  Diagnosis Date   CHF (congestive heart failure) (HCC)    Diabetes mellitus without complication (HCC)    GERD (gastroesophageal reflux disease)    Hypertension    Insomnia    Iron deficiency anemia    RLS (restless legs  syndrome)     PAST SURGICAL HISTORY: Past Surgical History:  Procedure Laterality Date   BLADDER REPAIR     CATARACT EXTRACTION W/PHACO Right 05/13/2016   Procedure: CATARACT EXTRACTION PHACO AND INTRAOCULAR LENS PLACEMENT ([place]);  Surgeon: Birder Robson, MD;  Location: [facility] ORS;  Service: Ophthalmology;  Laterality: Right;  [place] 1.09 AP% 21.0 CDE 14.60 Fluid Pack Lot # [id number]   CATARACT EXTRACTION W/PHACO Left 07/15/2016   Procedure: CATARACT EXTRACTION PHACO AND INTRAOCULAR LENS PLACEMENT ([facility]);  Surgeon: Birder Robson, MD;  Location: [facility] ORS;  Service: Ophthalmology;  Laterality: Left;  Lot# [id number] H [place]: 01:17.9 AP%:27.8 CDE: 21.67     FAMILY HISTORY: History reviewed. No pertinent family history.   ADVANCED DIRECTIVES:    HEALTH MAINTENANCE: Social History   Tobacco Use   Smoking status: Former   Smokeless tobacco: Never  Scientific laboratory technician Use: Never used  Substance Use Topics   Alcohol use: No   Drug use: No     Colonoscopy:  PAP:  Bone density:  Lipid panel:  Allergies  Allergen Reactions   Atorvastatin Other (See Comments)   Codeine    Demerol [Meperidine]    Polysaccharide Iron Complex Other (See Comments)    Caused constipation   Naproxen Rash    Caused rash on mouth    Current Outpatient Medications  Medication Sig Dispense Refill   amLODipine (NORVASC) 10 MG tablet Take 10 mg by mouth daily.     Calcium Carbonate-Vit D-Min (CALTRATE 600+D PLUS MINERALS) 600-800 MG-UNIT CHEW Chew 1 tablet by mouth as directed.     candesartan (ATACAND) 4 MG tablet Take by mouth.     ferrous sulfate 325 (65 FE)  MG tablet Take 325 mg by mouth daily.     gabapentin (NEURONTIN) 100 MG capsule Take 100 mg by mouth at bedtime.     lisinopril-hydrochlorothiazide (ZESTORETIC) 20-12.5 MG tablet Take 1 tablet by mouth daily.     metFORMIN (GLUCOPHAGE) 500 MG tablet Take 500 mg by mouth 2 (two) times daily with a meal.     Multiple Vitamins-Minerals (MULTIVITAMIN  WITH MINERALS) tablet Take 1 tablet by mouth daily.     omeprazole (PRILOSEC) 20 MG capsule Take 20 mg by mouth 2 (two) times daily.     pramipexole (MIRAPEX) 0.25 MG tablet Take 0.25 mg by mouth in the morning and at bedtime.     rosuvastatin (CRESTOR) 5 MG tablet Take 5 mg by mouth at bedtime.     traZODone (DESYREL) 50 MG tablet Take 50 mg by mouth at bedtime.     furosemide (LASIX) 20 MG tablet Take 20 mg by mouth daily as needed. Take 1 daily as needed for edema (Patient not taking: Reported on 08/27/2021)     No current facility-administered medications for this visit.    OBJECTIVE: Vitals:   08/27/21 1416  BP: (!) 143/46  Pulse: 80  Resp: 17  Temp: (!) 96.3 F (35.7 C)  SpO2: 97%     Body mass index is 25.89 kg/m.    ECOG FS:0 - Asymptomatic  Physical Exam Constitutional:      General: She is not in acute distress.    Appearance: She is well-developed. She is not ill-appearing.  HENT:     Mouth/Throat:     Pharynx: No oropharyngeal exudate.  Eyes:     General: No scleral icterus. Cardiovascular:     Rate and Rhythm: Normal rate and regular rhythm.  Pulmonary:     Effort: Pulmonary effort is normal.     Breath sounds: Normal breath sounds.  Abdominal:     General: There is no distension.     Palpations: Abdomen is soft.     Tenderness: There is no abdominal tenderness. There is no rebound.  Musculoskeletal:        General: No tenderness or deformity.     Right lower leg: No edema.     Left lower leg: No edema.  Skin:    General: Skin is warm and dry.     Coloration: Skin is not pale.  Neurological:     General: No focal deficit present.     Mental Status: She is alert and oriented to person, place, and time.     Motor: No weakness.  Psychiatric:        Mood and Affect: Mood normal.        Behavior: Behavior normal.     LAB RESULTS:  Lab Results  Component Value Date   WBC 7.2 08/27/2021   NEUTROABS 5.1 08/27/2021   HGB 10.7 (L) 08/27/2021   HCT  32.8 (L) 08/27/2021   MCV 93.7 08/27/2021   PLT 216 08/27/2021   Lab Results  Component Value Date   IRON 38 08/27/2021   TIBC 379 08/27/2021   IRONPCTSAT 10 (L) 08/27/2021   Lab Results  Component Value Date   FERRITIN 28 08/27/2021    STUDIES: No results found.  ASSESSMENT: Iron deficiency anemia d/t GI bleed  PLAN:    1. Iron deficiency anemia/symptomatic anemia: Likely GI etiology however, she declines evaluation with GI. Previously hospitalized for hemoglobin of 7.2.  She received venofer x 5, last in November 2022. Energy has improved. Today, hemoglobin  continues to improve to 10.7 (previously 7.2.  Normocytic.  Ferritin and iron studies are pending at time of visit but have since resulted.  Ferritin 28, iron saturation 10% with normal TIBC.  Proceed with Venofer today.   2. B12 deficiency-B12 was last checked in 2016 that was resulted as 371 time.  Goal > 500. Check at next visit.   Disposition: Venofer today x 3 3 months- lab (cbc, ferritin, iron studies, b12), [place], possible venofer- la  Patient expressed understanding and was in agreement with this plan. She also understands that She can call clinic at any time with any questions, concerns, or complaints.   Verlon Au, NP   08/27/2021

## 2021-08-27 NOTE — Progress Notes (Signed)
Patient here for oncology follow-up appointment, expresses no complaints or concerns at this time.    

## 2021-09-03 ENCOUNTER — Other Ambulatory Visit: Payer: Self-pay

## 2021-09-03 ENCOUNTER — Inpatient Hospital Stay: Payer: Medicare HMO

## 2021-09-03 VITALS — BP 115/71 | HR 76 | Resp 18

## 2021-09-03 DIAGNOSIS — Z87891 Personal history of nicotine dependence: Secondary | ICD-10-CM | POA: Diagnosis not present

## 2021-09-03 DIAGNOSIS — D508 Other iron deficiency anemias: Secondary | ICD-10-CM

## 2021-09-03 DIAGNOSIS — R5383 Other fatigue: Secondary | ICD-10-CM | POA: Diagnosis not present

## 2021-09-03 DIAGNOSIS — D509 Iron deficiency anemia, unspecified: Secondary | ICD-10-CM | POA: Diagnosis not present

## 2021-09-03 MED ORDER — IRON SUCROSE 20 MG/ML IV SOLN
200.0000 mg | Freq: Once | INTRAVENOUS | Status: AC
  Administered 2021-09-03: 14:00:00 200 mg via INTRAVENOUS
  Filled 2021-09-03: qty 10

## 2021-09-03 MED ORDER — SODIUM CHLORIDE 0.9 % IV SOLN
INTRAVENOUS | Status: DC
  Filled 2021-09-03: qty 250

## 2021-09-03 MED ORDER — SODIUM CHLORIDE 0.9 % IV SOLN: 200.0000 mg | Freq: Once | INTRAVENOUS | Status: DC

## 2021-09-05 ENCOUNTER — Other Ambulatory Visit: Payer: Self-pay

## 2021-09-05 ENCOUNTER — Inpatient Hospital Stay: Payer: Medicare HMO

## 2021-09-05 VITALS — BP 124/51 | HR 80 | Temp 97.8°F | Resp 18

## 2021-09-05 DIAGNOSIS — R5383 Other fatigue: Secondary | ICD-10-CM | POA: Diagnosis not present

## 2021-09-05 DIAGNOSIS — Z87891 Personal history of nicotine dependence: Secondary | ICD-10-CM | POA: Diagnosis not present

## 2021-09-05 DIAGNOSIS — D509 Iron deficiency anemia, unspecified: Secondary | ICD-10-CM | POA: Diagnosis not present

## 2021-09-05 DIAGNOSIS — D508 Other iron deficiency anemias: Secondary | ICD-10-CM

## 2021-09-05 MED ORDER — IRON SUCROSE 20 MG/ML IV SOLN
200.0000 mg | Freq: Once | INTRAVENOUS | Status: AC
  Administered 2021-09-05: 200 mg via INTRAVENOUS
  Filled 2021-09-05: qty 10

## 2021-09-05 MED ORDER — SODIUM CHLORIDE 0.9 % IV SOLN: 200.0000 mg | Freq: Once | INTRAVENOUS | Status: DC

## 2021-09-05 MED ORDER — SODIUM CHLORIDE 0.9 % IV SOLN
INTRAVENOUS | Status: DC | PRN
  Filled 2021-09-05: qty 250

## 2021-10-07 IMAGING — US US EXTREM LOW VENOUS*L*
1 series · 14 of 24 positions shown · non-contrast
Comparison: None.

CLINICAL DATA: Elevated D-dimer.

EXAM:
LEFT LOWER EXTREMITY VENOUS DOPPLER ULTRASOUND
TECHNIQUE: Gray-scale sonography with compression, as well as color and duplex
ultrasound, were performed to evaluate the deep venous system(s)
from the level of the common femoral vein through the popliteal and
proximal calf veins.

[Series 1: us venous img lower uni left (dvt) · portal-venous · 14 of 33 slices shown]
[im 1/33]
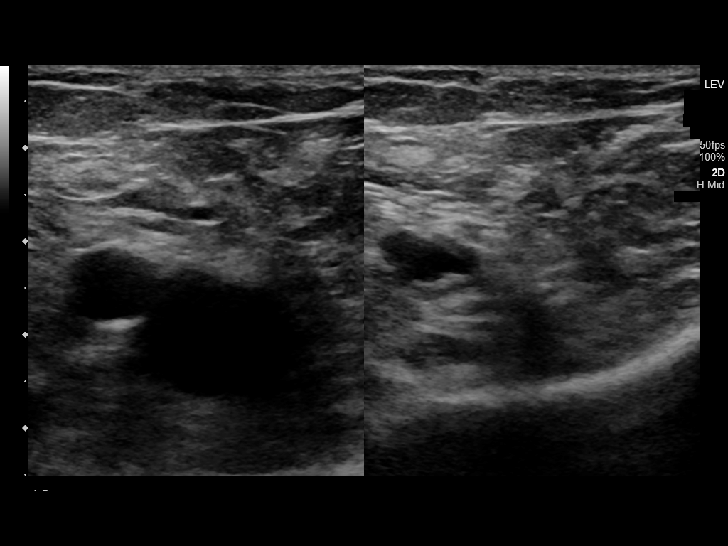
[im 3/33]
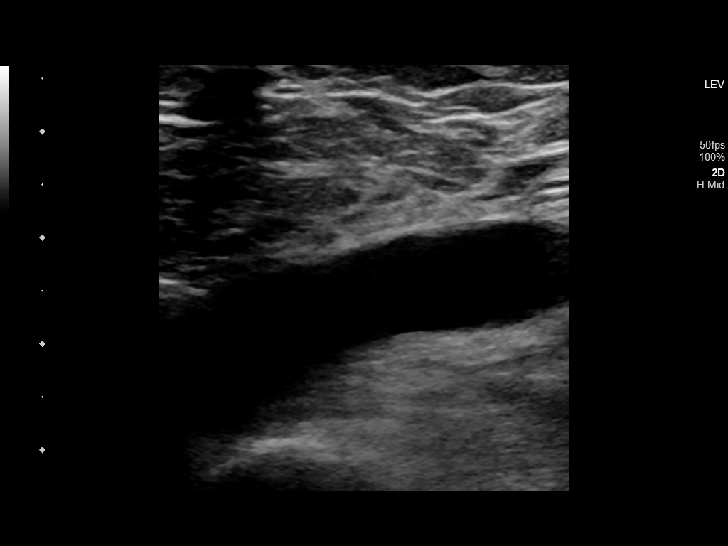
[im 6/33]
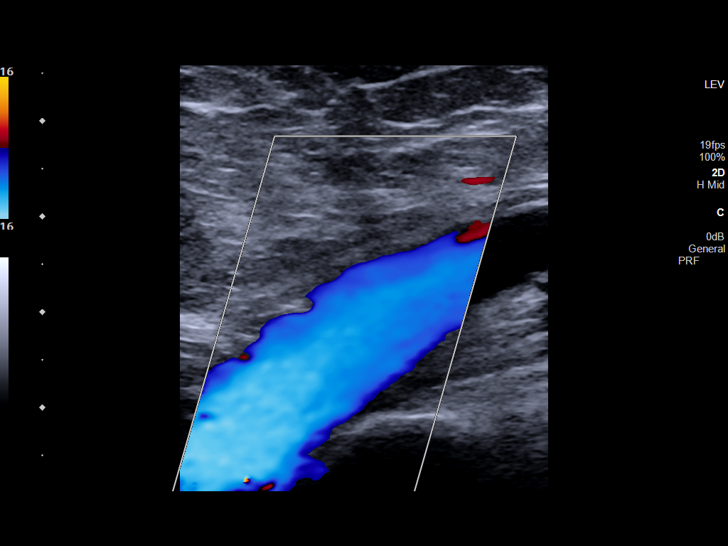
[im 9/33]
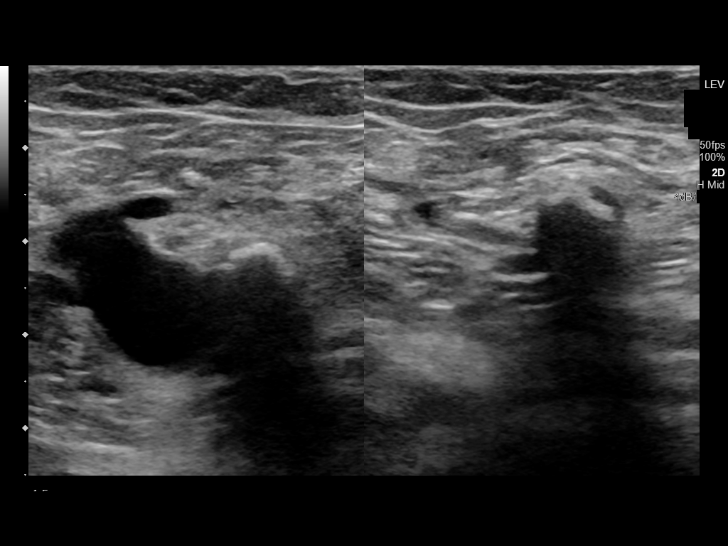
[im 10/33]
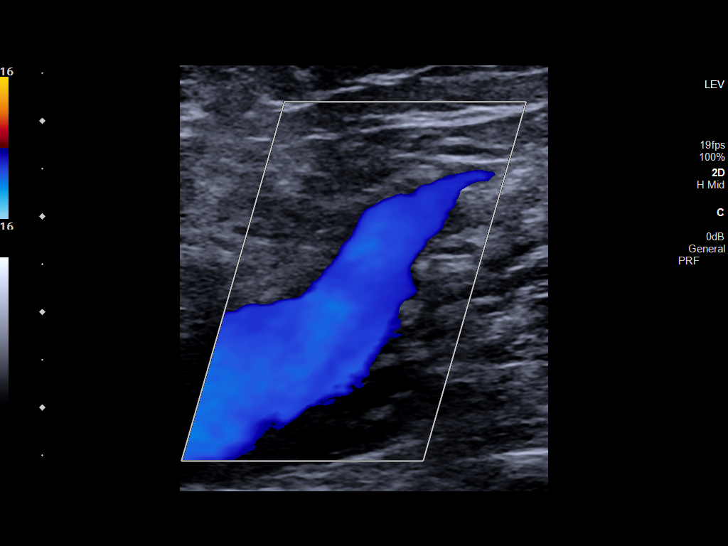
[im 13/33]
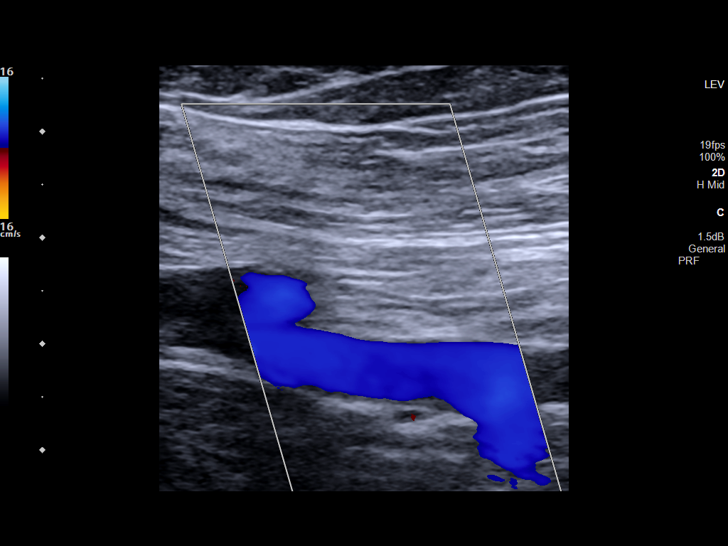
[im 16/33]
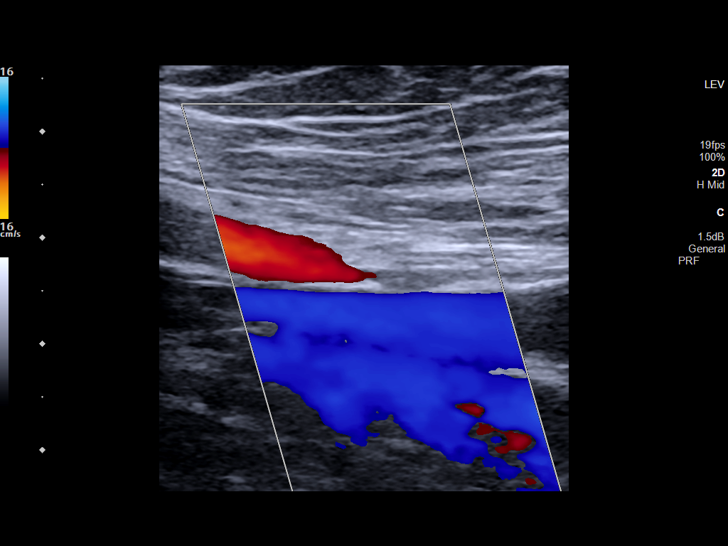
[im 17/33]
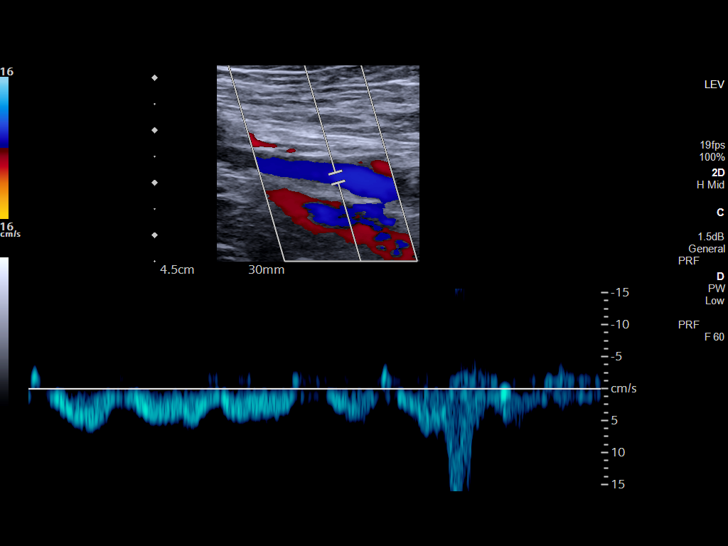
[im 20/33]
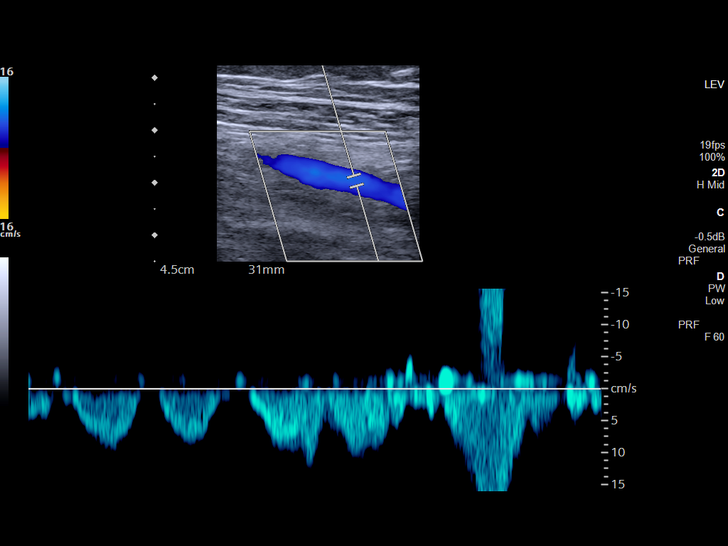
[im 23/33]
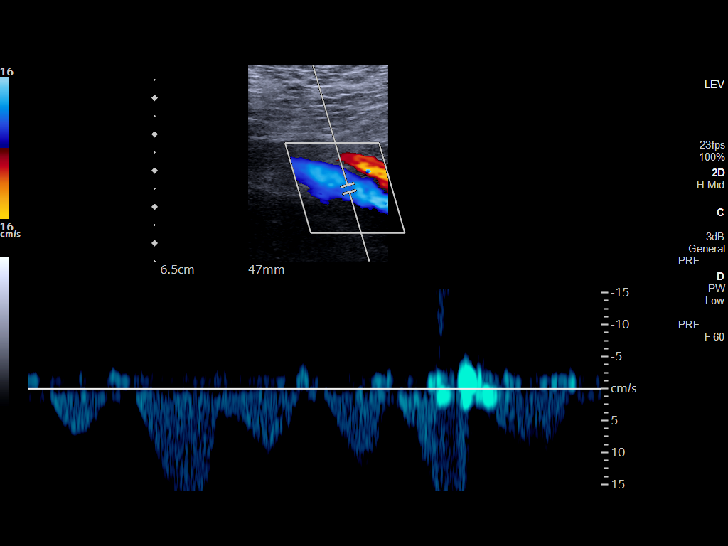
[im 26/33]
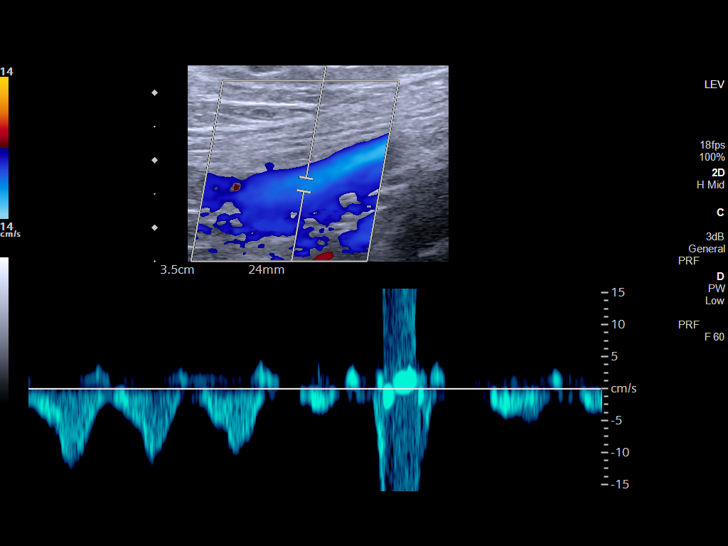
[im 27/33]
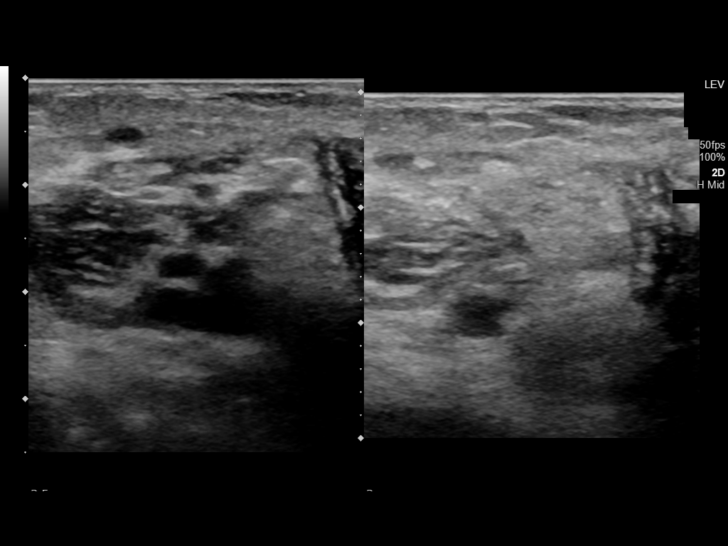
[im 30/33]
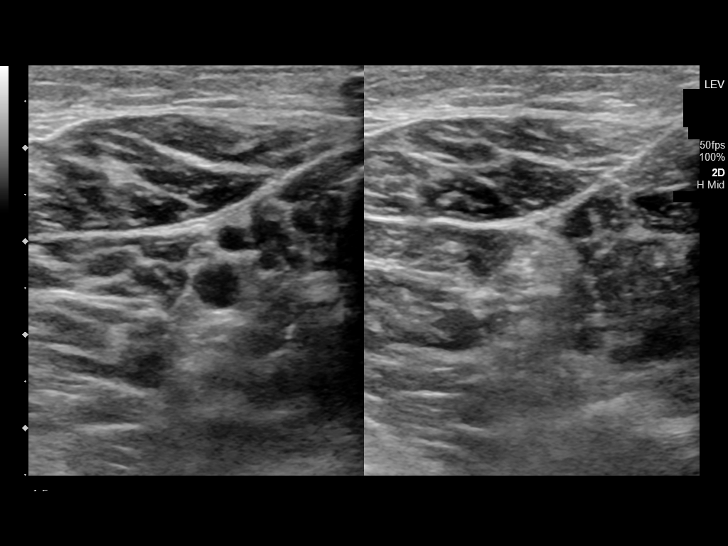
[im 33/33]
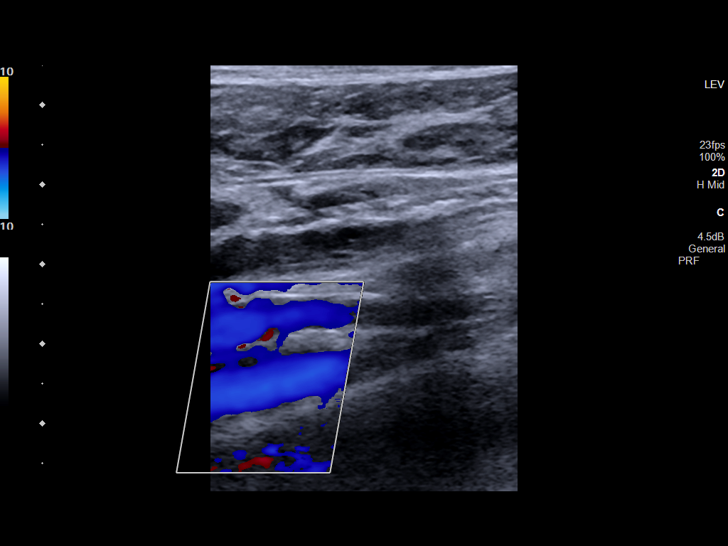

[14 of 24 positions shown; findings below may reference images not displayed]

FINDINGS: VENOUS

Normal compressibility of the common femoral, superficial femoral,
and popliteal veins, as well as the visualized calf veins.
Visualized portions of profunda femoral vein and great saphenous
vein unremarkable. No filling defects to suggest DVT on grayscale or
color Doppler imaging. Doppler waveforms show normal direction of
venous flow, normal respiratory plasticity and response to
augmentation.

Limited views of the contralateral common femoral vein are
unremarkable.

OTHER

None.
IMPRESSION: Negative exam.  No evidence of DVT.

## 2021-10-07 IMAGING — US US EXTREM LOW VENOUS*R*
1 series · 14 of 24 positions shown · non-contrast
Comparison: No prior.

CLINICAL DATA: Right lower extremity swelling.

EXAM:
RIGHT LOWER EXTREMITY VENOUS DOPPLER ULTRASOUND
TECHNIQUE: Gray-scale sonography with compression, as well as color and duplex
ultrasound, were performed to evaluate the deep venous system(s)
from the level of the common femoral vein through the popliteal and
proximal calf veins.

[Series 1: us venous img lower uni right (dvt) · portal-venous · 14 of 33 slices shown]
[im 1/33]
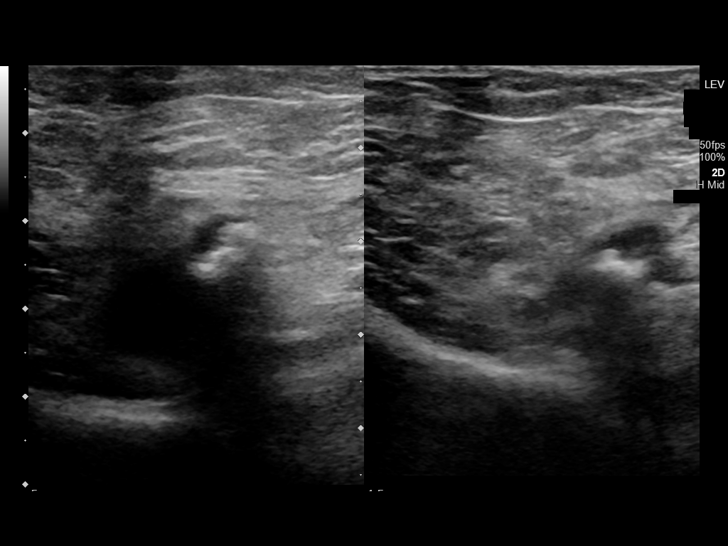
[im 3/33]
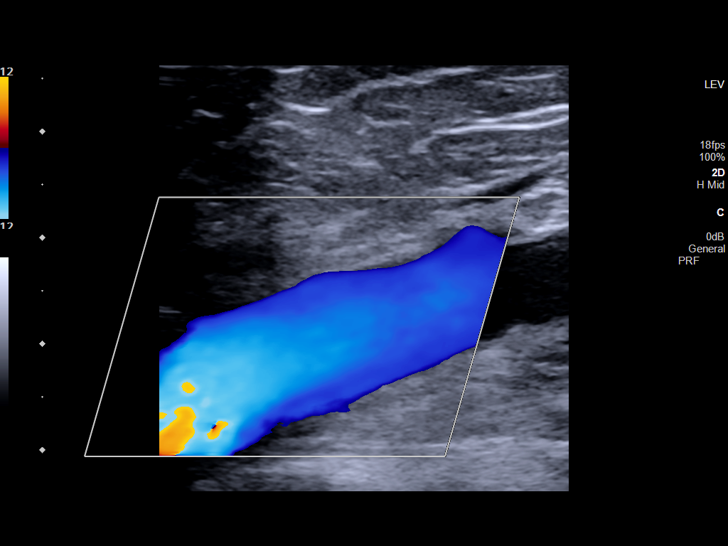
[im 6/33]
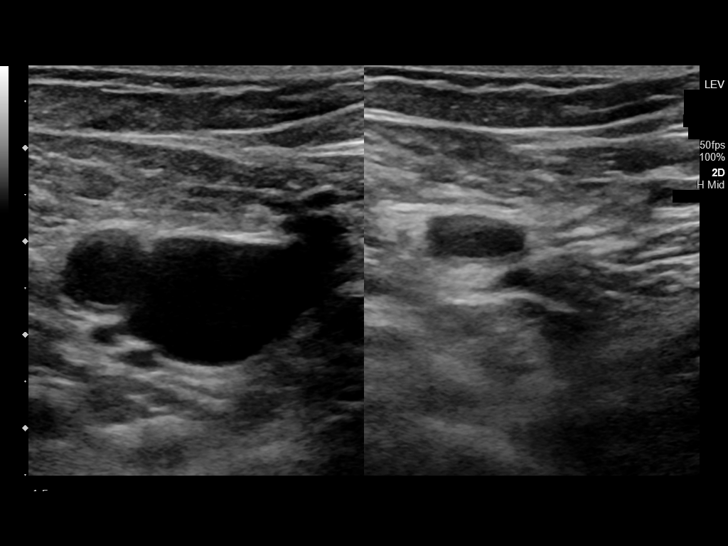
[im 9/33]
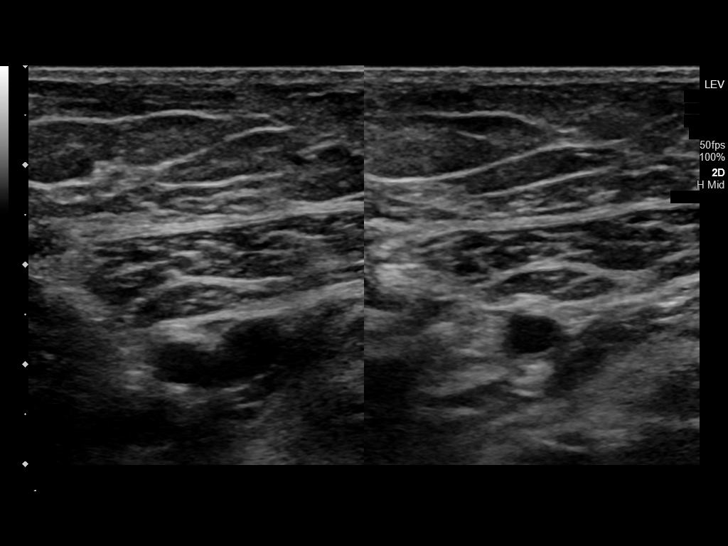
[im 10/33]
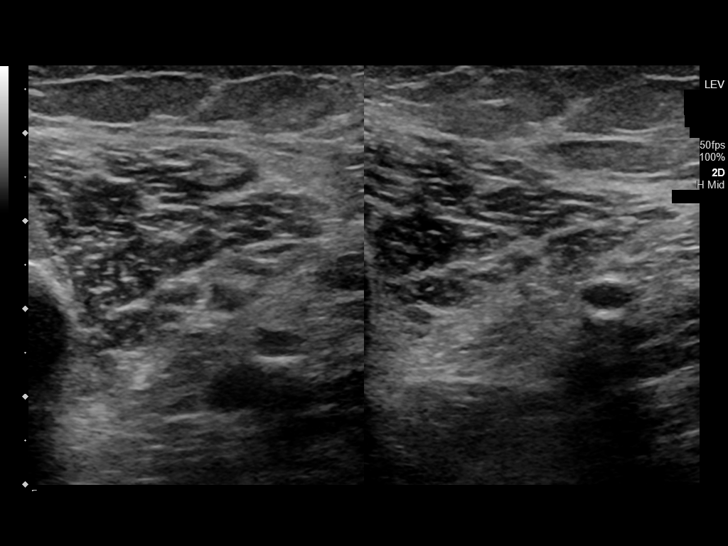
[im 13/33]
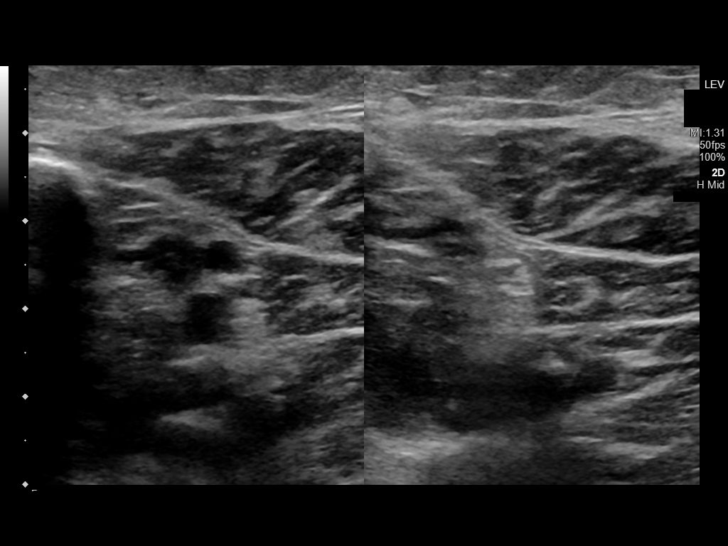
[im 16/33]
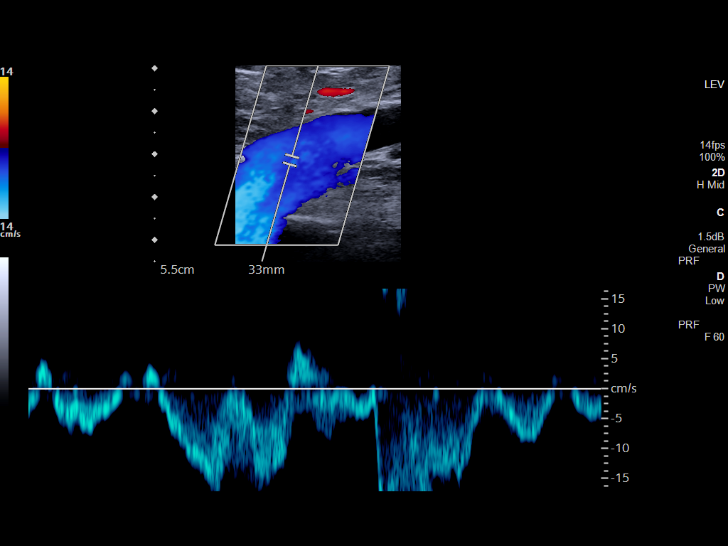
[im 17/33]
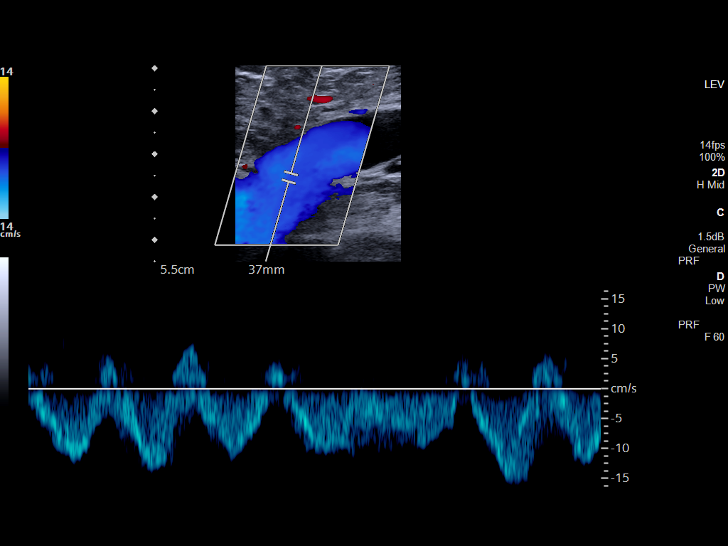
[im 20/33]
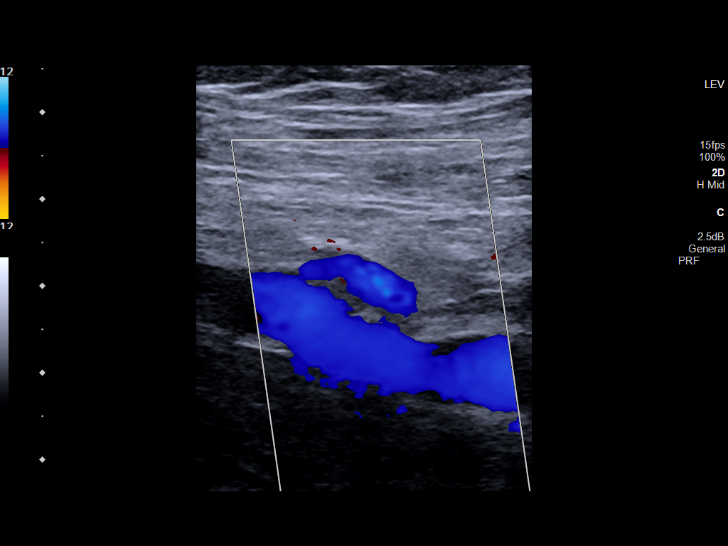
[im 23/33]
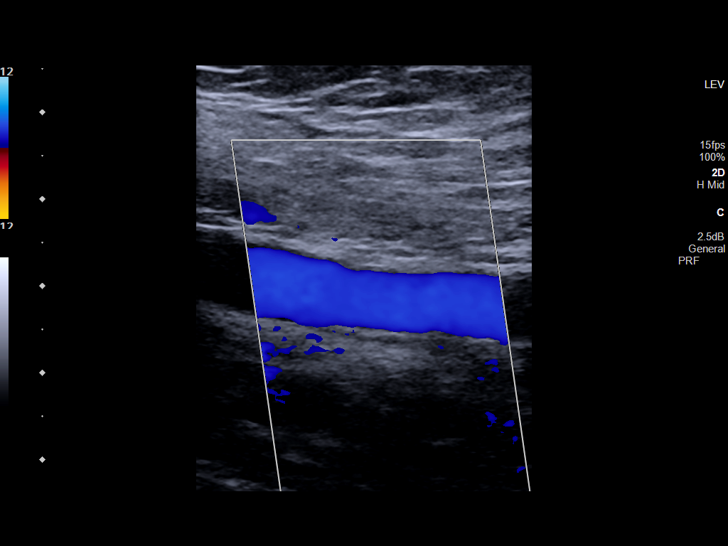
[im 26/33]
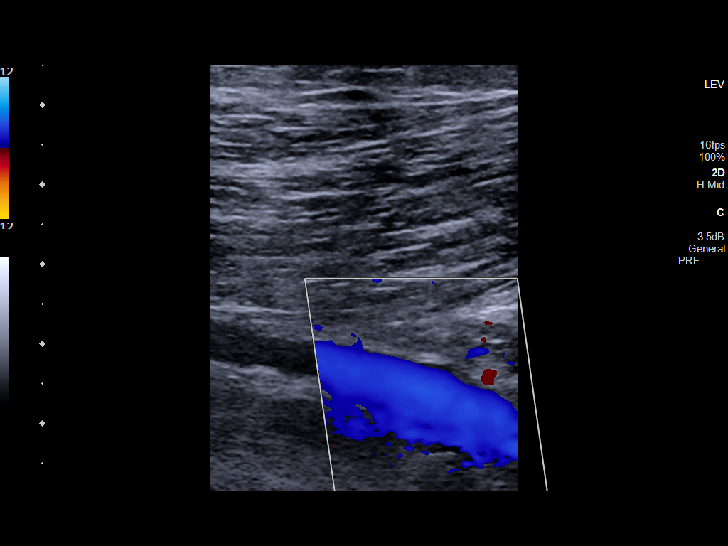
[im 27/33]
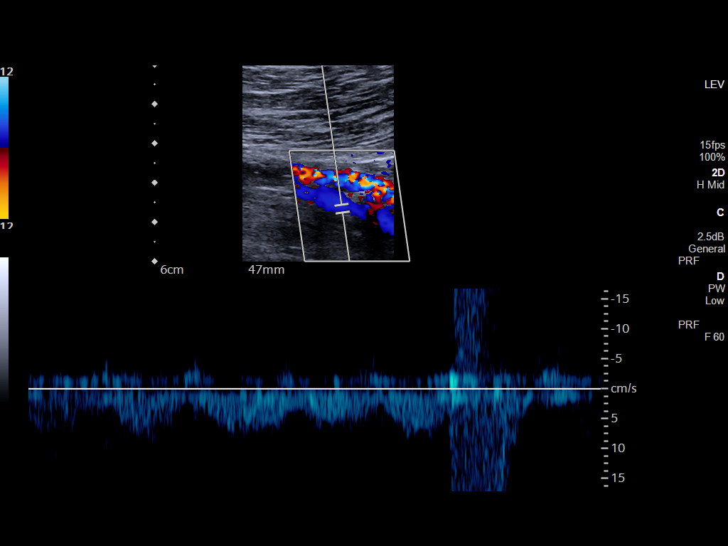
[im 30/33]
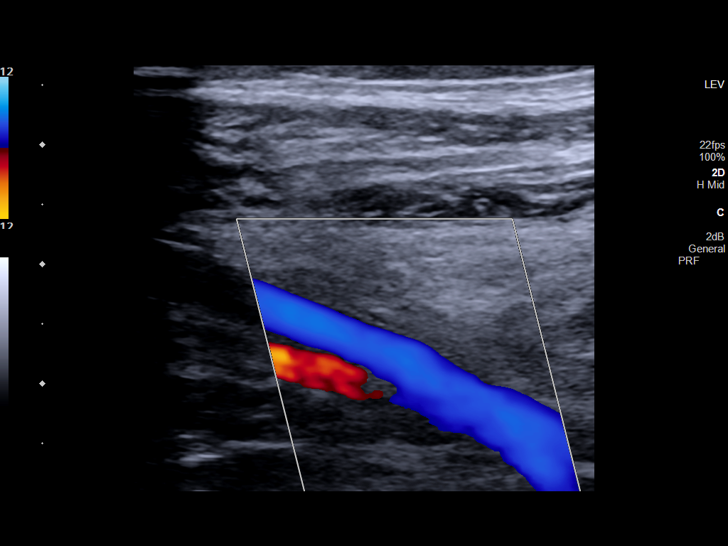
[im 33/33]
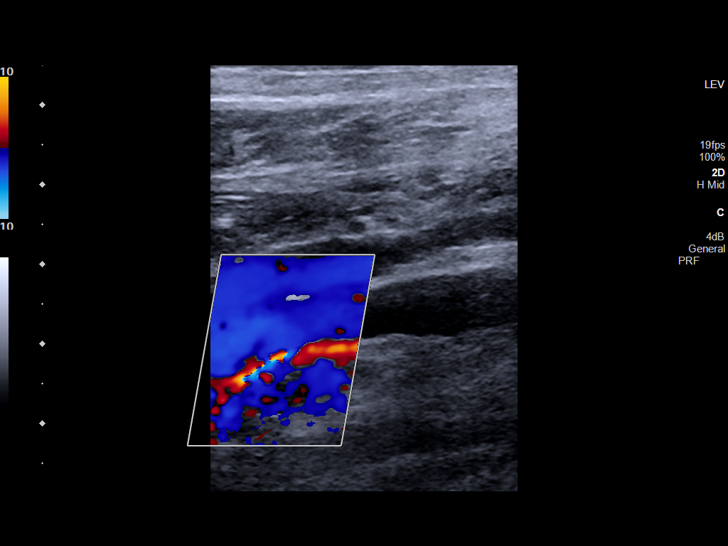

[14 of 24 positions shown; findings below may reference images not displayed]

FINDINGS: VENOUS

Normal compressibility of the common femoral, superficial femoral,
and popliteal veins, as well as the visualized calf veins.
Visualized portions of profunda femoral vein and great saphenous
vein unremarkable. No filling defects to suggest DVT on grayscale or
color Doppler imaging. Doppler waveforms show normal direction of
venous flow, normal respiratory plasticity and response to
augmentation.

Limited views of the contralateral common femoral vein are
unremarkable.

OTHER

None.
IMPRESSION: Negative exam.  No evidence of DVT.

## 2021-10-07 IMAGING — CT CT ANGIO CHEST
2 of 6 series · 18 of 46 positions shown · IV contrast (APPLIED)
Comparison: None.

CLINICAL DATA: Shortness of breath.

EXAM:
CT ANGIOGRAPHY CHEST WITH CONTRAST
TECHNIQUE: Multidetector CT imaging of the chest was performed using the
standard protocol during bolus administration of intravenous
contrast. Multiplanar CT image reconstructions and MIPs were
obtained to evaluate the vascular anatomy.
CONTRAST:  50mL OMNIPAQUE IOHEXOL 350 MG/ML SOLN

[Series 5: thins · axial · 0.61mm/px · z∈[-297,-52]mm · 15 of 269 slices shown]
[im 12/269  lung]
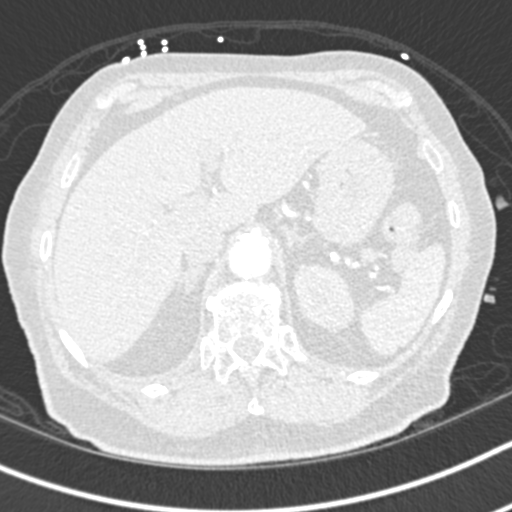
[im 35/269  soft-tissue]
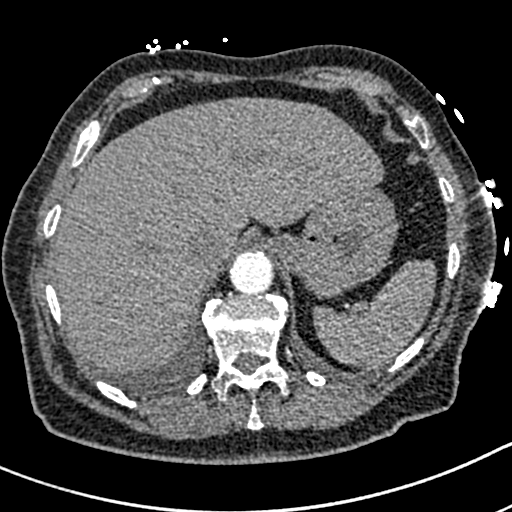
[im 47/269  lung]
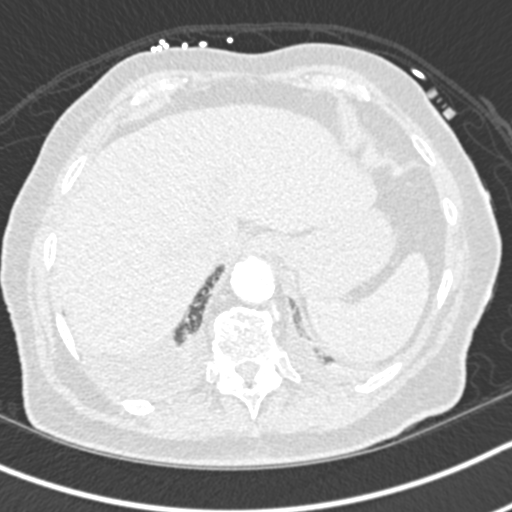
[im 70/269  soft-tissue]
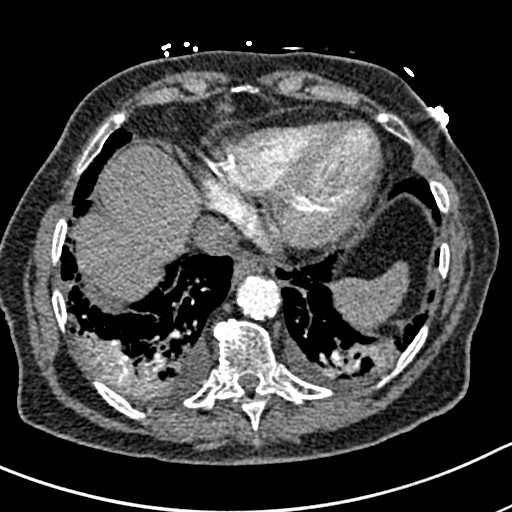
[im 82/269  lung]
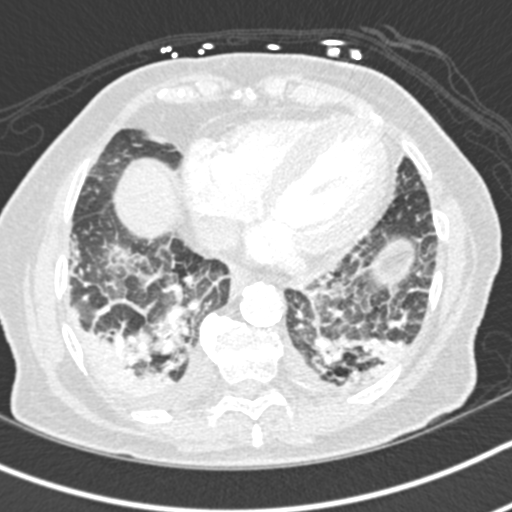
[im 105/269  soft-tissue]
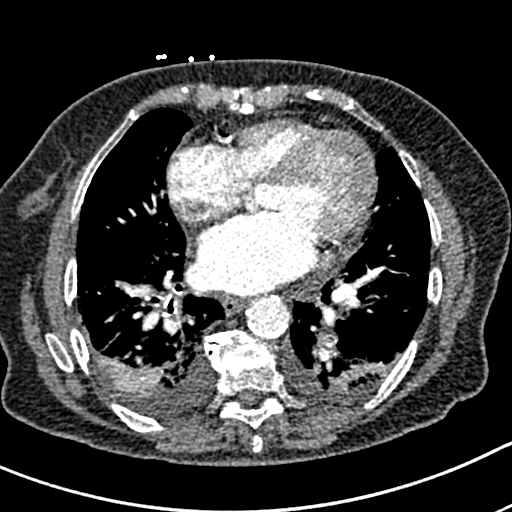
[im 117/269  lung]
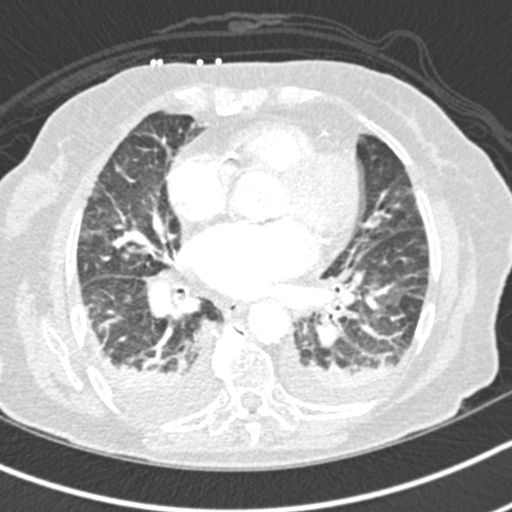
[im 140/269  soft-tissue]
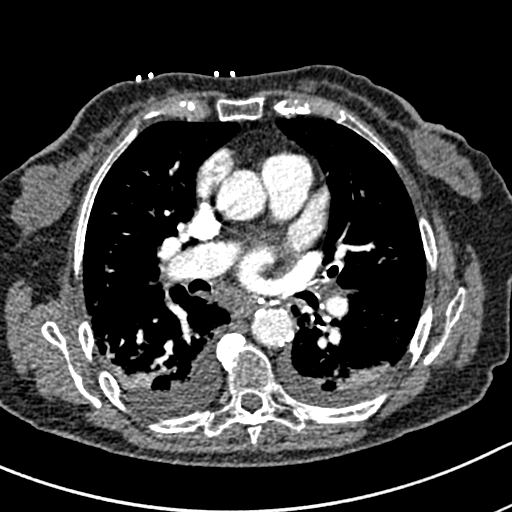
[im 152/269  lung]
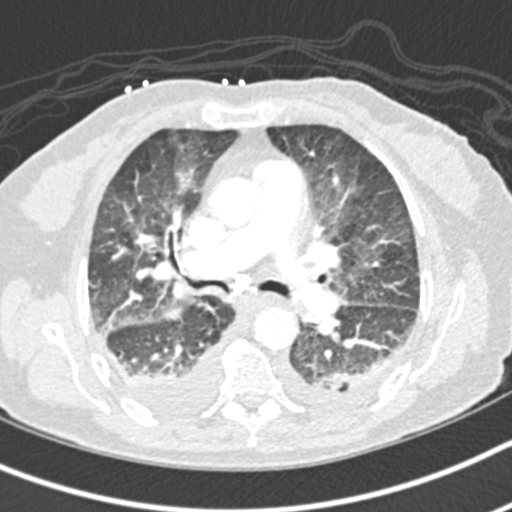
[im 164/269  soft-tissue]
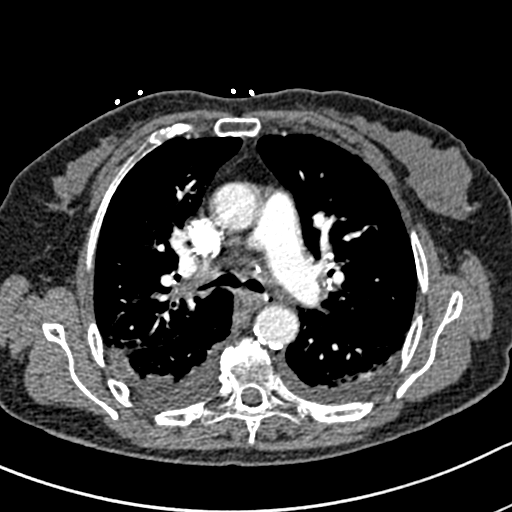
[im 187/269  lung]
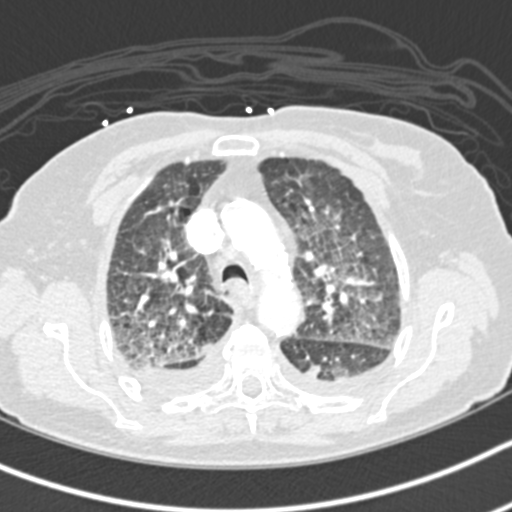
[im 199/269  soft-tissue]
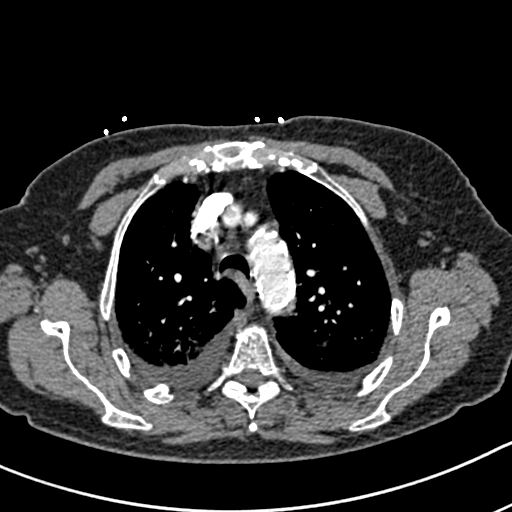
[im 222/269  lung]
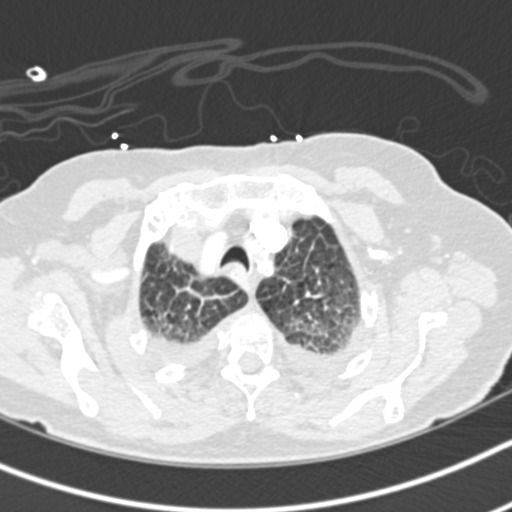
[im 234/269  soft-tissue]
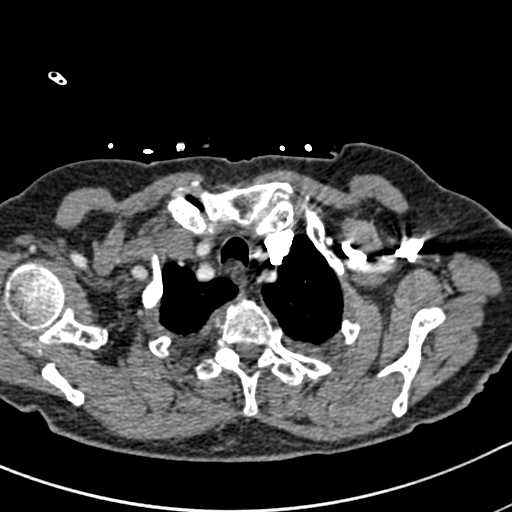
[im 257/269  lung]
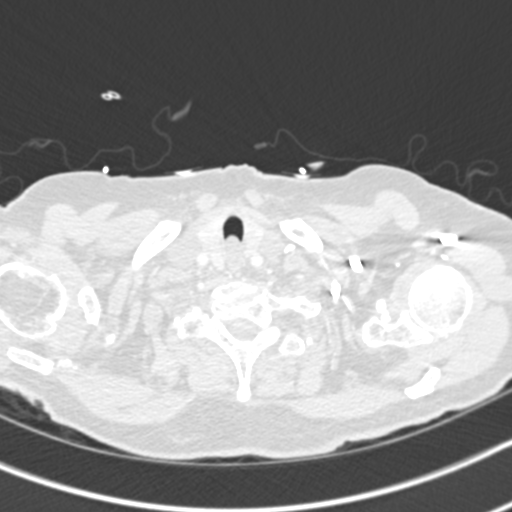

[Series 7: coronal mpr · coronal · 0.53mm/px · 3 of 89 slices shown]
[im 23/89  soft-tissue]
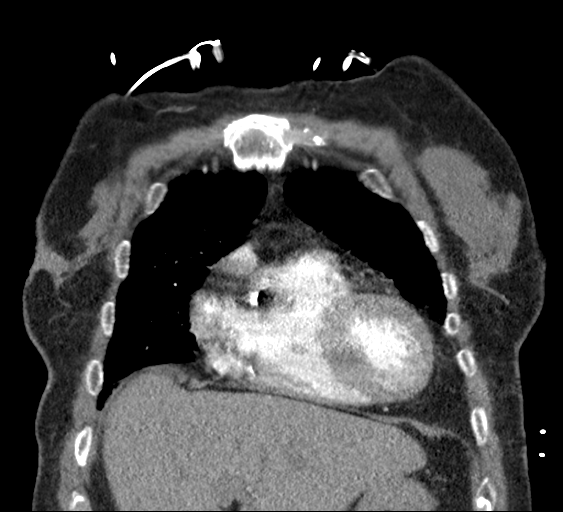
[im 45/89  soft-tissue]
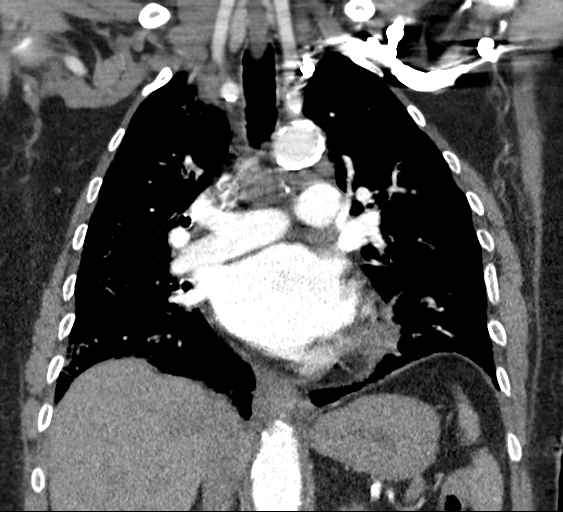
[im 67/89  soft-tissue]
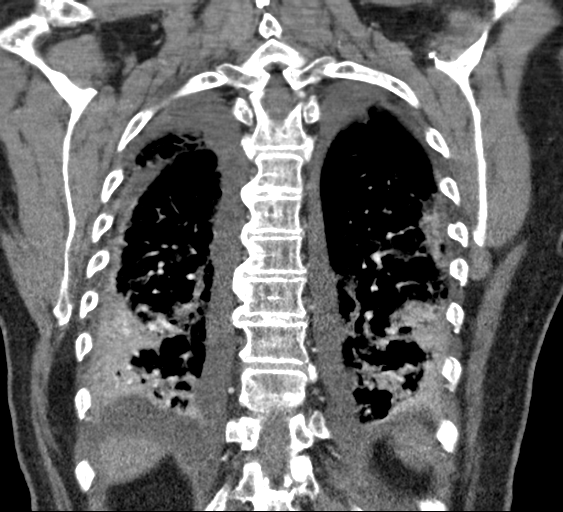

[18 of 46 positions shown; findings below may reference images not displayed]

FINDINGS: Cardiovascular: Satisfactory opacification of the pulmonary arteries
to the segmental level. No evidence of pulmonary embolism. Mild
cardiomegaly is noted. Coronary artery calcifications are noted.
Atherosclerosis of thoracic aorta is noted without aneurysm or
dissection. No pericardial effusion.

Mediastinum/Nodes: Thyroid gland and esophagus are unremarkable.
Calcified subcarinal and left hilar adenopathy is noted consistent
with prior granulomatous disease. However, noncalcified adenopathy
is noted in the precarinal area measuring 14 mm and in the AP window
measuring 13 mm.

Lungs/Pleura: Small bilateral pleural effusions are noted with
adjacent subsegmental atelectasis of the lower lobes. No
pneumothorax is noted. Interstitial densities are noted throughout
both lungs concerning for pulmonary edema.

Upper Abdomen: Calcified splenic granulomata are noted.

Musculoskeletal: No chest wall abnormality. No acute or significant
osseous findings.

Review of the MIP images confirms the above findings.
IMPRESSION: 1. No definite evidence of pulmonary embolus.
2. Coronary artery calcifications are noted suggesting coronary
artery disease.
3. Small bilateral pleural effusions are noted with adjacent
subsegmental atelectasis of the lower lobes.
4. Interstitial densities are noted throughout both lungs concerning
for pulmonary edema.
5. Enlarged noncalcified mediastinal adenopathy is noted which may
be inflammatory or neoplastic in etiology.
6. Calcified subcarinal left hilar adenopathy is noted most likely
due to prior granulomatous disease.

Aortic Atherosclerosis (BOSWY-VOD.D).

## 2021-10-17 DIAGNOSIS — Z4689 Encounter for fitting and adjustment of other specified devices: Secondary | ICD-10-CM | POA: Diagnosis not present

## 2021-10-17 DIAGNOSIS — N814 Uterovaginal prolapse, unspecified: Secondary | ICD-10-CM | POA: Diagnosis not present

## 2021-10-22 DIAGNOSIS — N2581 Secondary hyperparathyroidism of renal origin: Secondary | ICD-10-CM | POA: Diagnosis not present

## 2021-10-22 DIAGNOSIS — D631 Anemia in chronic kidney disease: Secondary | ICD-10-CM | POA: Diagnosis not present

## 2021-10-22 DIAGNOSIS — E1122 Type 2 diabetes mellitus with diabetic chronic kidney disease: Secondary | ICD-10-CM | POA: Diagnosis not present

## 2021-10-22 DIAGNOSIS — I10 Essential (primary) hypertension: Secondary | ICD-10-CM | POA: Diagnosis not present

## 2021-10-22 DIAGNOSIS — N1832 Chronic kidney disease, stage 3b: Secondary | ICD-10-CM | POA: Diagnosis not present

## 2021-10-29 DIAGNOSIS — E611 Iron deficiency: Secondary | ICD-10-CM | POA: Diagnosis not present

## 2021-10-29 DIAGNOSIS — I10 Essential (primary) hypertension: Secondary | ICD-10-CM | POA: Diagnosis not present

## 2021-10-29 DIAGNOSIS — E1169 Type 2 diabetes mellitus with other specified complication: Secondary | ICD-10-CM | POA: Diagnosis not present

## 2021-10-29 DIAGNOSIS — I350 Nonrheumatic aortic (valve) stenosis: Secondary | ICD-10-CM | POA: Diagnosis not present

## 2021-10-29 DIAGNOSIS — E782 Mixed hyperlipidemia: Secondary | ICD-10-CM | POA: Diagnosis not present

## 2021-10-29 DIAGNOSIS — E119 Type 2 diabetes mellitus without complications: Secondary | ICD-10-CM | POA: Diagnosis not present

## 2021-10-29 DIAGNOSIS — I5032 Chronic diastolic (congestive) heart failure: Secondary | ICD-10-CM | POA: Diagnosis not present

## 2021-10-29 DIAGNOSIS — N1832 Chronic kidney disease, stage 3b: Secondary | ICD-10-CM | POA: Diagnosis not present

## 2021-10-29 DIAGNOSIS — E785 Hyperlipidemia, unspecified: Secondary | ICD-10-CM | POA: Diagnosis not present

## 2021-11-05 DIAGNOSIS — I129 Hypertensive chronic kidney disease with stage 1 through stage 4 chronic kidney disease, or unspecified chronic kidney disease: Secondary | ICD-10-CM | POA: Diagnosis not present

## 2021-11-05 DIAGNOSIS — E1122 Type 2 diabetes mellitus with diabetic chronic kidney disease: Secondary | ICD-10-CM | POA: Diagnosis not present

## 2021-11-05 DIAGNOSIS — N1832 Chronic kidney disease, stage 3b: Secondary | ICD-10-CM | POA: Diagnosis not present

## 2021-11-05 DIAGNOSIS — R011 Cardiac murmur, unspecified: Secondary | ICD-10-CM | POA: Diagnosis not present

## 2021-11-05 DIAGNOSIS — E782 Mixed hyperlipidemia: Secondary | ICD-10-CM | POA: Diagnosis not present

## 2021-11-05 DIAGNOSIS — E611 Iron deficiency: Secondary | ICD-10-CM | POA: Diagnosis not present

## 2021-11-05 DIAGNOSIS — E1169 Type 2 diabetes mellitus with other specified complication: Secondary | ICD-10-CM | POA: Diagnosis not present

## 2021-11-25 NOTE — Progress Notes (Signed)
?[facility]  ?[phone]) [phone] [phone] ? ?ID: Lisa Mcneil OB: [birth year]  MR#: [id number]  CSN#:[id number] ? ?Patient Care Team: ?Dion Body, MD as PCP - General (Family Medicine) ?Lloyd Huger, MD as Consulting Physician (Hematology and Oncology) ? ?CHIEF COMPLAINT:  Iron deficiency anemia. ? ?INTERVAL HISTORY: Patient returns to clinic today for repeat laboratory work, further evaluation, consideration of additional IV iron.  She remains active and continues to work full-time.  She denies any weakness or fatigue. She has no neurologic complaints. She denies any recent fevers or illnesses. She has a good appetite and denies weight loss.  She denies any chest pain, shortness of breath, cough, or hemoptysis.  She denies any nausea, vomiting, consultation, or diarrhea. She has no melena or hematochezia.  She has no urinary complaints.  Patient offers no specific complaints today. ? ?REVIEW OF SYSTEMS:   ?Review of Systems  ?Constitutional: Negative.  Negative for fever, malaise/fatigue and weight loss.  ?Respiratory: Negative.  Negative for cough and shortness of breath.   ?Cardiovascular: Negative.  Negative for chest pain and leg swelling.  ?Gastrointestinal: Negative.  Negative for abdominal pain, blood in stool and melena.  ?Genitourinary: Negative.  Negative for hematuria.  ?Musculoskeletal: Negative.  Negative for back pain.  ?Skin: Negative.  Negative for rash.  ?Neurological: Negative.  Negative for sensory change, focal weakness and weakness.  ?Psychiatric/Behavioral: Negative.  The patient is not nervous/anxious.   ? ?As per HPI. Otherwise, a complete review of systems is negative. ? ?PAST MEDICAL HISTORY: ?Past Medical History:  ?Diagnosis Date  ? CHF (congestive heart failure) ([place])   ? Diabetes mellitus without complication ([place])   ? GERD (gastroesophageal reflux disease)   ? Hypertension   ? Insomnia   ? Iron deficiency anemia   ? RLS (restless  legs syndrome)   ? ? ?PAST SURGICAL HISTORY: ?Past Surgical History:  ?Procedure Laterality Date  ? BLADDER REPAIR    ? CATARACT EXTRACTION W/PHACO Right 05/13/2016  ? Procedure: CATARACT EXTRACTION PHACO AND INTRAOCULAR LENS PLACEMENT ([facility]);  Surgeon: Birder Robson, MD;  Location: [facility] ORS;  Service: Ophthalmology;  Laterality: Right;  [place] 1.09 ?AP% 21.0 ?CDE 14.60 ?Fluid Pack Lot # [id number]  ? CATARACT EXTRACTION W/PHACO Left 07/15/2016  ? Procedure: CATARACT EXTRACTION PHACO AND INTRAOCULAR LENS PLACEMENT ([facility]);  Surgeon: Birder Robson, MD;  Location: [facility] ORS;  Service: Ophthalmology;  Laterality: Left;  Lot# [id number] ?[place]: 01:17.9 ?AP%:27.8 ?CDE: 21.67 ?  ? ? ?FAMILY HISTORY: Reviewed and unchanged. No reported history of malignancy or chronic disease. ? ?  ? ADVANCED DIRECTIVES:  ? ? ?HEALTH MAINTENANCE: ?Social History  ? ?Tobacco Use  ? Smoking status: Former  ? Smokeless tobacco: Never  ?Vaping Use  ? Vaping Use: Never used  ?Substance Use Topics  ? Alcohol use: No  ? Drug use: No  ? ? ? Colonoscopy: ? PAP: ? Bone density: ? Lipid panel: ? ?Allergies  ?Allergen Reactions  ? Atorvastatin Other (See Comments)  ? Codeine   ? Demerol [Meperidine]   ? Polysaccharide Iron Complex Other (See Comments)  ?  Caused constipation  ? Naproxen Rash  ?  Caused rash on mouth  ? ? ?Current Outpatient Medications  ?Medication Sig Dispense Refill  ? amLODipine (NORVASC) 10 MG tablet Take 10 mg by mouth daily.    ? Calcium Carbonate-Vit D-Min (CALTRATE 600+D PLUS MINERALS) 600-800 MG-UNIT CHEW Chew 1 tablet by mouth as directed.    ? ferrous sulfate 325 (65 FE) MG tablet Take  325 mg by mouth daily.    ? furosemide (LASIX) 20 MG tablet Take 20 mg by mouth daily as needed. Take 1 daily as needed for edema    ? gabapentin (NEURONTIN) 100 MG capsule Take 100 mg by mouth at bedtime.    ? lisinopril-hydrochlorothiazide (ZESTORETIC) 20-12.5 MG tablet Take 1 tablet by mouth daily.    ? metFORMIN (GLUCOPHAGE) 500 MG tablet Take 500  mg by mouth 2 (two) times daily with a meal.    ? Multiple Vitamins-Minerals (MULTIVITAMIN WITH MINERALS) tablet Take 1 tablet by mouth daily.    ? omeprazole (PRILOSEC) 20 MG capsule Take 20 mg by mouth 2 (two) times daily.    ? pramipexole (MIRAPEX) 0.25 MG tablet Take 0.25 mg by mouth in the morning and at bedtime.    ? rosuvastatin (CRESTOR) 5 MG tablet Take 5 mg by mouth at bedtime.    ? traZODone (DESYREL) 50 MG tablet Take 50 mg by mouth at bedtime.    ? candesartan (ATACAND) 4 MG tablet Take by mouth. (Patient not taking: Reported on 11/26/2021)    ? ?No current facility-administered medications for this visit.  ? ?Facility-Administered Medications Ordered in Other Visits  ?Medication Dose Route Frequency Provider Last Rate Last Admin  ? 0.9 %  sodium chloride infusion   Intravenous Continuous Lloyd Huger, MD   Stopped at 11/26/21 1519  ? ? ?OBJECTIVE: ?Vitals:  ? 11/26/21 1341  ?BP: (!) 119/44  ?Pulse: 77  ?Resp: 16  ?Temp: (!) 97.3 ?F (36.3 ?C)  ?SpO2: 95%  ?   Body mass index is 25.51 kg/m?Marland Kitchen    ECOG FS:0 - Asymptomatic ? ?General: Well-developed, well-nourished, no acute distress. ?Eyes: Pink conjunctiva, anicteric sclera. ?HEENT: Normocephalic, moist mucous membranes. ?Lungs: No audible wheezing or coughing. ?Heart: Regular rate and rhythm. ?Abdomen: Soft, nontender, no obvious distention. ?Musculoskeletal: No edema, cyanosis, or clubbing. ?Neuro: Alert, answering all questions appropriately. Cranial nerves grossly intact. ?Skin: No rashes or petechiae noted. ?Psych: Normal affect. ? ? ?LAB RESULTS: ? ?Lab Results  ?Component Value Date  ? NA 136 01/02/2021  ? K 5.2 (H) 01/02/2021  ? CL 105 01/02/2021  ? CO2 22 01/02/2021  ? GLUCOSE 204 (H) 01/02/2021  ? BUN 37 (H) 01/02/2021  ? CREATININE 1.70 (H) 01/02/2021  ? CALCIUM 9.3 01/02/2021  ? PROT 7.8 02/16/2015  ? ALBUMIN 4.0 02/16/2015  ? AST 27 02/16/2015  ? ALT 18 02/16/2015  ? ALKPHOS 56 02/16/2015  ? BILITOT 0.3 02/16/2015  ? GFRNONAA 28 (L)  01/02/2021  ? GFRAA >60 02/17/2015  ? ? ?Lab Results  ?Component Value Date  ? WBC 5.7 11/26/2021  ? NEUTROABS 3.9 11/26/2021  ? HGB 8.2 (L) 11/26/2021  ? HCT 26.1 (L) 11/26/2021  ? MCV 100.0 11/26/2021  ? PLT 208 11/26/2021  ? ?Lab Results  ?Component Value Date  ? IRON 143 11/26/2021  ? TIBC 386 11/26/2021  ? IRONPCTSAT 37 (H) 11/26/2021  ? ?Lab Results  ?Component Value Date  ? FERRITIN 14 11/26/2021  ? ? ? ?STUDIES: ?No results found. ? ?ASSESSMENT: Iron deficiency anemia. ? ?PLAN:   ? ?1. Iron deficiency anemia: Although patient's iron stores are within normal limits, her ferritin is only 14 and her hemoglobin is trended down.  Previously, the remainder of her blood work was either negative or within normal limits.  Proceed with Venofer today.  Return to clinic 4 times over the next several weeks to receive treatment only.   Patient will then return to  clinic in 3 months with repeat laboratory work, further evaluation, and continuation of treatment if needed. ? ?I spent a total of 30 minutes reviewing chart data, face-to-face evaluation with the patient, counseling and coordination of care as detailed above. ? ? ?Patient expressed understanding and was in agreement with this plan. She also understands that She can call clinic at any time with any questions, concerns, or complaints.  ? ? ?Lloyd Huger, MD   11/26/2021 4:14 PM ? ? ? ? ? ?

## 2021-11-26 ENCOUNTER — Inpatient Hospital Stay: Payer: Medicare HMO

## 2021-11-26 ENCOUNTER — Inpatient Hospital Stay: Payer: Medicare HMO | Attending: Oncology

## 2021-11-26 ENCOUNTER — Encounter: Payer: Self-pay | Admitting: Oncology

## 2021-11-26 ENCOUNTER — Inpatient Hospital Stay: Payer: Medicare HMO | Admitting: Oncology

## 2021-11-26 VITALS — BP 119/44 | HR 77 | Temp 97.3°F | Resp 16 | Ht 61.0 in | Wt 135.0 lb

## 2021-11-26 DIAGNOSIS — D508 Other iron deficiency anemias: Secondary | ICD-10-CM

## 2021-11-26 DIAGNOSIS — D509 Iron deficiency anemia, unspecified: Secondary | ICD-10-CM

## 2021-11-26 DIAGNOSIS — Z87891 Personal history of nicotine dependence: Secondary | ICD-10-CM | POA: Diagnosis not present

## 2021-11-26 LAB — CBC WITH DIFFERENTIAL/PLATELET
Abs Immature Granulocytes: 0.02 10*3/uL (ref 0.00–0.07)
Basophils Absolute: 0 10*3/uL (ref 0.0–0.1)
Basophils Relative: 1 %
Eosinophils Absolute: 0.2 10*3/uL (ref 0.0–0.5)
Eosinophils Relative: 4 %
HCT: 26.1 % — ABNORMAL LOW (ref 36.0–46.0)
Hemoglobin: 8.2 g/dL — ABNORMAL LOW (ref 12.0–15.0)
Immature Granulocytes: 0 %
Lymphocytes Relative: 21 %
Lymphs Abs: 1.2 10*3/uL (ref 0.7–4.0)
MCH: 31.4 pg (ref 26.0–34.0)
MCHC: 31.4 g/dL (ref 30.0–36.0)
MCV: 100 fL (ref 80.0–100.0)
Monocytes Absolute: 0.4 10*3/uL (ref 0.1–1.0)
Monocytes Relative: 6 %
Neutro Abs: 3.9 10*3/uL (ref 1.7–7.7)
Neutrophils Relative %: 68 %
Platelets: 208 10*3/uL (ref 150–400)
RBC: 2.61 MIL/uL — ABNORMAL LOW (ref 3.87–5.11)
RDW: 14.6 % (ref 11.5–15.5)
WBC: 5.7 10*3/uL (ref 4.0–10.5)
nRBC: 0 % (ref 0.0–0.2)

## 2021-11-26 LAB — IRON AND TIBC
Iron: 143 ug/dL (ref 28–170)
Saturation Ratios: 37 % — ABNORMAL HIGH (ref 10.4–31.8)
TIBC: 386 ug/dL (ref 250–450)
UIBC: 243 ug/dL

## 2021-11-26 LAB — VITAMIN B12: Vitamin B-12: 255 pg/mL (ref 180–914)

## 2021-11-26 LAB — FERRITIN: Ferritin: 14 ng/mL (ref 11–307)

## 2021-11-26 MED ORDER — IRON SUCROSE 20 MG/ML IV SOLN
200.0000 mg | Freq: Once | INTRAVENOUS | Status: AC
  Administered 2021-11-26: 200 mg via INTRAVENOUS
  Filled 2021-11-26: qty 10

## 2021-11-26 MED ORDER — SODIUM CHLORIDE 0.9 % IV SOLN: 200.0000 mg | Freq: Once | INTRAVENOUS | Status: DC

## 2021-11-26 MED ORDER — SODIUM CHLORIDE 0.9 % IV SOLN
INTRAVENOUS | Status: DC
  Filled 2021-11-26: qty 250

## 2021-11-26 NOTE — Patient Instructions (Signed)

## 2021-12-03 ENCOUNTER — Inpatient Hospital Stay: Payer: Medicare HMO

## 2021-12-03 VITALS — BP 115/45 | HR 79 | Temp 97.2°F | Resp 20

## 2021-12-03 DIAGNOSIS — D509 Iron deficiency anemia, unspecified: Secondary | ICD-10-CM | POA: Diagnosis not present

## 2021-12-03 DIAGNOSIS — D508 Other iron deficiency anemias: Secondary | ICD-10-CM

## 2021-12-03 DIAGNOSIS — Z87891 Personal history of nicotine dependence: Secondary | ICD-10-CM | POA: Diagnosis not present

## 2021-12-03 MED ORDER — SODIUM CHLORIDE 0.9 % IV SOLN: 200.0000 mg | Freq: Once | INTRAVENOUS | Status: DC

## 2021-12-03 MED ORDER — SODIUM CHLORIDE 0.9 % IV SOLN
Freq: Once | INTRAVENOUS | Status: AC
  Filled 2021-12-03: qty 250

## 2021-12-03 MED ORDER — IRON SUCROSE 20 MG/ML IV SOLN
200.0000 mg | Freq: Once | INTRAVENOUS | Status: AC
  Administered 2021-12-03: 200 mg via INTRAVENOUS
  Filled 2021-12-03: qty 10

## 2021-12-05 ENCOUNTER — Inpatient Hospital Stay: Payer: Medicare HMO

## 2021-12-05 VITALS — BP 124/43 | HR 78 | Temp 97.4°F | Resp 18

## 2021-12-05 DIAGNOSIS — D509 Iron deficiency anemia, unspecified: Secondary | ICD-10-CM | POA: Diagnosis not present

## 2021-12-05 DIAGNOSIS — D508 Other iron deficiency anemias: Secondary | ICD-10-CM

## 2021-12-05 DIAGNOSIS — Z87891 Personal history of nicotine dependence: Secondary | ICD-10-CM | POA: Diagnosis not present

## 2021-12-05 MED ORDER — IRON SUCROSE 20 MG/ML IV SOLN
200.0000 mg | Freq: Once | INTRAVENOUS | Status: AC
  Administered 2021-12-05: 200 mg via INTRAVENOUS

## 2021-12-05 MED ORDER — SODIUM CHLORIDE 0.9 % IV SOLN
INTRAVENOUS | Status: DC | PRN
  Filled 2021-12-05: qty 250

## 2021-12-05 MED ORDER — SODIUM CHLORIDE 0.9 % IV SOLN: 200.0000 mg | Freq: Once | INTRAVENOUS | Status: DC

## 2021-12-10 ENCOUNTER — Inpatient Hospital Stay: Payer: Medicare HMO | Attending: Oncology

## 2021-12-10 VITALS — BP 103/55 | HR 70 | Temp 97.9°F | Resp 16

## 2021-12-10 DIAGNOSIS — D509 Iron deficiency anemia, unspecified: Secondary | ICD-10-CM | POA: Insufficient documentation

## 2021-12-10 DIAGNOSIS — D508 Other iron deficiency anemias: Secondary | ICD-10-CM

## 2021-12-10 DIAGNOSIS — Z87891 Personal history of nicotine dependence: Secondary | ICD-10-CM | POA: Insufficient documentation

## 2021-12-10 MED ORDER — SODIUM CHLORIDE 0.9 % IV SOLN: 200.0000 mg | Freq: Once | INTRAVENOUS | Status: DC

## 2021-12-10 MED ORDER — SODIUM CHLORIDE 0.9 % IV SOLN
Freq: Once | INTRAVENOUS | Status: AC
  Filled 2021-12-10: qty 250

## 2021-12-10 MED ORDER — IRON SUCROSE 20 MG/ML IV SOLN
200.0000 mg | Freq: Once | INTRAVENOUS | Status: AC
  Administered 2021-12-10: 200 mg via INTRAVENOUS
  Filled 2021-12-10: qty 10

## 2021-12-12 ENCOUNTER — Inpatient Hospital Stay: Payer: Medicare HMO

## 2021-12-12 VITALS — BP 111/50 | HR 70 | Temp 98.4°F | Resp 18

## 2021-12-12 DIAGNOSIS — D509 Iron deficiency anemia, unspecified: Secondary | ICD-10-CM | POA: Diagnosis not present

## 2021-12-12 DIAGNOSIS — Z87891 Personal history of nicotine dependence: Secondary | ICD-10-CM | POA: Diagnosis not present

## 2021-12-12 DIAGNOSIS — D508 Other iron deficiency anemias: Secondary | ICD-10-CM

## 2021-12-12 MED ORDER — IRON SUCROSE 20 MG/ML IV SOLN
200.0000 mg | Freq: Once | INTRAVENOUS | Status: AC
  Administered 2021-12-12: 200 mg via INTRAVENOUS
  Filled 2021-12-12: qty 10

## 2021-12-12 MED ORDER — SODIUM CHLORIDE 0.9 % IV SOLN
Freq: Once | INTRAVENOUS | Status: AC
  Filled 2021-12-12: qty 250

## 2021-12-12 MED ORDER — SODIUM CHLORIDE 0.9 % IV SOLN: 200.0000 mg | Freq: Once | INTRAVENOUS | Status: DC

## 2021-12-12 NOTE — Patient Instructions (Signed)

## 2022-01-30 DIAGNOSIS — N814 Uterovaginal prolapse, unspecified: Secondary | ICD-10-CM | POA: Diagnosis not present

## 2022-01-30 DIAGNOSIS — Z4689 Encounter for fitting and adjustment of other specified devices: Secondary | ICD-10-CM | POA: Diagnosis not present

## 2022-02-26 ENCOUNTER — Other Ambulatory Visit: Payer: Medicare HMO

## 2022-02-27 ENCOUNTER — Ambulatory Visit: Payer: Medicare HMO

## 2022-02-27 ENCOUNTER — Ambulatory Visit: Payer: Medicare HMO | Admitting: Oncology

## 2022-03-13 NOTE — Progress Notes (Deleted)
[place]  Telephone:(336) [phone] Fax:(336) [phone]  ID: Lisa Mcneil OB: 1928/01/03  MR#: [id number]  GYB#:[id number]  Patient Care Team: Dion Body, MD as PCP - General (Family Medicine) Lloyd Huger, MD as Consulting Physician (Hematology and Oncology)  CHIEF COMPLAINT:  Iron deficiency anemia.  INTERVAL HISTORY: Patient returns to clinic today for repeat laboratory work, further evaluation, consideration of additional IV iron.  She remains active and continues to work full-time.  She denies any weakness or fatigue. She has no neurologic complaints. She denies any recent fevers or illnesses. She has a good appetite and denies weight loss.  She denies any chest pain, shortness of breath, cough, or hemoptysis.  She denies any nausea, vomiting, consultation, or diarrhea. She has no melena or hematochezia.  She has no urinary complaints.  Patient offers no specific complaints today.  REVIEW OF SYSTEMS:   Review of Systems  Constitutional: Negative.  Negative for fever, malaise/fatigue and weight loss.  Respiratory: Negative.  Negative for cough and shortness of breath.   Cardiovascular: Negative.  Negative for chest pain and leg swelling.  Gastrointestinal: Negative.  Negative for abdominal pain, blood in stool and melena.  Genitourinary: Negative.  Negative for hematuria.  Musculoskeletal: Negative.  Negative for back pain.  Skin: Negative.  Negative for rash.  Neurological: Negative.  Negative for sensory change, focal weakness and weakness.  Psychiatric/Behavioral: Negative.  The patient is not nervous/anxious.     As per HPI. Otherwise, a complete review of systems is negative.  PAST MEDICAL HISTORY: Past Medical History:  Diagnosis Date   CHF (congestive heart failure) (HCC)    Diabetes mellitus without complication (HCC)    GERD (gastroesophageal reflux disease)    Hypertension    Insomnia    Iron deficiency anemia    RLS  (restless legs syndrome)     PAST SURGICAL HISTORY: Past Surgical History:  Procedure Laterality Date   BLADDER REPAIR     CATARACT EXTRACTION W/PHACO Right 05/13/2016   Procedure: CATARACT EXTRACTION PHACO AND INTRAOCULAR LENS PLACEMENT ([place]);  Surgeon: Birder Robson, MD;  Location: [facility] ORS;  Service: Ophthalmology;  Laterality: Right;  [place] 1.09 AP% 21.0 CDE 14.60 Fluid Pack Lot # [id number]   CATARACT EXTRACTION W/PHACO Left 07/15/2016   Procedure: CATARACT EXTRACTION PHACO AND INTRAOCULAR LENS PLACEMENT ([facility]);  Surgeon: Birder Robson, MD;  Location: [facility] ORS;  Service: Ophthalmology;  Laterality: Left;  Lot# [id number] H [place]: 01:17.9 AP%:27.8 CDE: 21.67     FAMILY HISTORY: Reviewed and unchanged. No reported history of malignancy or chronic disease.     ADVANCED DIRECTIVES:    HEALTH MAINTENANCE: Social History   Tobacco Use   Smoking status: Former   Smokeless tobacco: Never  Scientific laboratory technician Use: Never used  Substance Use Topics   Alcohol use: No   Drug use: No     Colonoscopy:  PAP:  Bone density:  Lipid panel:  Allergies  Allergen Reactions   Atorvastatin Other (See Comments)   Codeine    Demerol [Meperidine]    Polysaccharide Iron Complex Other (See Comments)    Caused constipation   Naproxen Rash    Caused rash on mouth    Current Outpatient Medications  Medication Sig Dispense Refill   amLODipine (NORVASC) 10 MG tablet Take 10 mg by mouth daily.     Calcium Carbonate-Vit D-Min (CALTRATE 600+D PLUS MINERALS) 600-800 MG-UNIT CHEW Chew 1 tablet by mouth as directed.     candesartan (ATACAND) 4 MG tablet Take by  mouth. (Patient not taking: Reported on 11/26/2021)     ferrous sulfate 325 (65 FE) MG tablet Take 325 mg by mouth daily.     furosemide (LASIX) 20 MG tablet Take 20 mg by mouth daily as needed. Take 1 daily as needed for edema     gabapentin (NEURONTIN) 100 MG capsule Take 100 mg by mouth at bedtime.     lisinopril-hydrochlorothiazide  (ZESTORETIC) 20-12.5 MG tablet Take 1 tablet by mouth daily.     metFORMIN (GLUCOPHAGE) 500 MG tablet Take 500 mg by mouth 2 (two) times daily with a meal.     Multiple Vitamins-Minerals (MULTIVITAMIN WITH MINERALS) tablet Take 1 tablet by mouth daily.     omeprazole (PRILOSEC) 20 MG capsule Take 20 mg by mouth 2 (two) times daily.     pramipexole (MIRAPEX) 0.25 MG tablet Take 0.25 mg by mouth in the morning and at bedtime.     rosuvastatin (CRESTOR) 5 MG tablet Take 5 mg by mouth at bedtime.     traZODone (DESYREL) 50 MG tablet Take 50 mg by mouth at bedtime.     No current facility-administered medications for this visit.    OBJECTIVE: There were no vitals filed for this visit.    There is no height or weight on file to calculate BMI.    ECOG FS:0 - Asymptomatic  General: Well-developed, well-nourished, no acute distress. Eyes: Pink conjunctiva, anicteric sclera. HEENT: Normocephalic, moist mucous membranes. Lungs: No audible wheezing or coughing. Heart: Regular rate and rhythm. Abdomen: Soft, nontender, no obvious distention. Musculoskeletal: No edema, cyanosis, or clubbing. Neuro: Alert, answering all questions appropriately. Cranial nerves grossly intact. Skin: No rashes or petechiae noted. Psych: Normal affect.   LAB RESULTS:  Lab Results  Component Value Date   NA 136 01/02/2021   K 5.2 (H) 01/02/2021   CL 105 01/02/2021   CO2 22 01/02/2021   GLUCOSE 204 (H) 01/02/2021   BUN 37 (H) 01/02/2021   CREATININE 1.70 (H) 01/02/2021   CALCIUM 9.3 01/02/2021   PROT 7.8 02/16/2015   ALBUMIN 4.0 02/16/2015   AST 27 02/16/2015   ALT 18 02/16/2015   ALKPHOS 56 02/16/2015   BILITOT 0.3 02/16/2015   GFRNONAA 28 (L) 01/02/2021   GFRAA >60 02/17/2015    Lab Results  Component Value Date   WBC 5.7 11/26/2021   NEUTROABS 3.9 11/26/2021   HGB 8.2 (L) 11/26/2021   HCT 26.1 (L) 11/26/2021   MCV 100.0 11/26/2021   PLT 208 11/26/2021   Lab Results  Component Value Date    IRON 143 11/26/2021   TIBC 386 11/26/2021   IRONPCTSAT 37 (H) 11/26/2021   Lab Results  Component Value Date   FERRITIN 14 11/26/2021     STUDIES: No results found.  ASSESSMENT: Iron deficiency anemia.  PLAN:    1. Iron deficiency anemia: Although patient's iron stores are within normal limits, her ferritin is only 14 and her hemoglobin is trended down.  Previously, the remainder of her blood work was either negative or within normal limits.  Proceed with Venofer today.  Return to clinic 4 times over the next several weeks to receive treatment only.   Patient will then return to clinic in 3 months with repeat laboratory work, further evaluation, and continuation of treatment if needed.  I spent a total of 30 minutes reviewing chart data, face-to-face evaluation with the patient, counseling and coordination of care as detailed above.   Patient expressed understanding and was in agreement with this plan.  She also understands that She can call clinic at any time with any questions, concerns, or complaints.    Lloyd Huger, MD   03/13/2022 11:27 PM

## 2022-03-19 ENCOUNTER — Inpatient Hospital Stay: Payer: Medicare HMO

## 2022-03-20 ENCOUNTER — Inpatient Hospital Stay: Payer: Medicare HMO | Admitting: Oncology

## 2022-03-20 ENCOUNTER — Inpatient Hospital Stay: Payer: Medicare HMO

## 2022-03-20 DIAGNOSIS — D509 Iron deficiency anemia, unspecified: Secondary | ICD-10-CM

## 2022-04-07 DIAGNOSIS — E1122 Type 2 diabetes mellitus with diabetic chronic kidney disease: Secondary | ICD-10-CM | POA: Diagnosis not present

## 2022-04-07 DIAGNOSIS — N1832 Chronic kidney disease, stage 3b: Secondary | ICD-10-CM | POA: Diagnosis not present

## 2022-05-01 DIAGNOSIS — I5032 Chronic diastolic (congestive) heart failure: Secondary | ICD-10-CM | POA: Diagnosis not present

## 2022-05-01 DIAGNOSIS — E119 Type 2 diabetes mellitus without complications: Secondary | ICD-10-CM | POA: Diagnosis not present

## 2022-05-01 DIAGNOSIS — E782 Mixed hyperlipidemia: Secondary | ICD-10-CM | POA: Diagnosis not present

## 2022-05-01 DIAGNOSIS — E1169 Type 2 diabetes mellitus with other specified complication: Secondary | ICD-10-CM | POA: Diagnosis not present

## 2022-05-01 DIAGNOSIS — I350 Nonrheumatic aortic (valve) stenosis: Secondary | ICD-10-CM | POA: Diagnosis not present

## 2022-05-01 DIAGNOSIS — I10 Essential (primary) hypertension: Secondary | ICD-10-CM | POA: Diagnosis not present

## 2022-05-01 DIAGNOSIS — E611 Iron deficiency: Secondary | ICD-10-CM | POA: Diagnosis not present

## 2022-05-01 DIAGNOSIS — E785 Hyperlipidemia, unspecified: Secondary | ICD-10-CM | POA: Diagnosis not present

## 2022-05-01 DIAGNOSIS — N1832 Chronic kidney disease, stage 3b: Secondary | ICD-10-CM | POA: Diagnosis not present

## 2022-05-06 DIAGNOSIS — N814 Uterovaginal prolapse, unspecified: Secondary | ICD-10-CM | POA: Diagnosis not present

## 2022-05-06 DIAGNOSIS — Z4689 Encounter for fitting and adjustment of other specified devices: Secondary | ICD-10-CM | POA: Diagnosis not present

## 2022-05-08 DIAGNOSIS — Z4689 Encounter for fitting and adjustment of other specified devices: Secondary | ICD-10-CM | POA: Diagnosis not present

## 2022-05-09 DIAGNOSIS — N189 Chronic kidney disease, unspecified: Secondary | ICD-10-CM | POA: Diagnosis not present

## 2022-05-09 DIAGNOSIS — Z0000 Encounter for general adult medical examination without abnormal findings: Secondary | ICD-10-CM | POA: Diagnosis not present

## 2022-05-09 DIAGNOSIS — D649 Anemia, unspecified: Secondary | ICD-10-CM | POA: Diagnosis not present

## 2022-05-09 DIAGNOSIS — Z1331 Encounter for screening for depression: Secondary | ICD-10-CM | POA: Diagnosis not present

## 2022-05-09 DIAGNOSIS — R7303 Prediabetes: Secondary | ICD-10-CM | POA: Diagnosis not present

## 2022-05-20 DIAGNOSIS — I5032 Chronic diastolic (congestive) heart failure: Secondary | ICD-10-CM | POA: Diagnosis not present

## 2022-05-20 DIAGNOSIS — I350 Nonrheumatic aortic (valve) stenosis: Secondary | ICD-10-CM | POA: Diagnosis not present

## 2022-06-09 ENCOUNTER — Other Ambulatory Visit: Payer: Medicare HMO

## 2022-06-10 ENCOUNTER — Ambulatory Visit: Payer: Medicare HMO

## 2022-06-10 ENCOUNTER — Ambulatory Visit: Payer: Medicare HMO | Admitting: Oncology

## 2022-06-12 ENCOUNTER — Inpatient Hospital Stay: Payer: Medicare HMO | Attending: Oncology

## 2022-06-12 ENCOUNTER — Inpatient Hospital Stay: Payer: Medicare HMO

## 2022-06-12 ENCOUNTER — Inpatient Hospital Stay: Payer: Medicare HMO | Admitting: Oncology

## 2022-06-12 ENCOUNTER — Encounter: Payer: Self-pay | Admitting: Oncology

## 2022-06-12 VITALS — BP 143/64 | HR 93 | Temp 97.9°F | Resp 16 | Wt 132.4 lb

## 2022-06-12 DIAGNOSIS — Z87891 Personal history of nicotine dependence: Secondary | ICD-10-CM | POA: Insufficient documentation

## 2022-06-12 DIAGNOSIS — D509 Iron deficiency anemia, unspecified: Secondary | ICD-10-CM | POA: Insufficient documentation

## 2022-06-12 DIAGNOSIS — D508 Other iron deficiency anemias: Secondary | ICD-10-CM

## 2022-06-12 LAB — CBC WITH DIFFERENTIAL/PLATELET
Abs Immature Granulocytes: 0.03 10*3/uL (ref 0.00–0.07)
Basophils Absolute: 0.1 10*3/uL (ref 0.0–0.1)
Basophils Relative: 1 %
Eosinophils Absolute: 0.3 10*3/uL (ref 0.0–0.5)
Eosinophils Relative: 5 %
HCT: 24.9 % — ABNORMAL LOW (ref 36.0–46.0)
Hemoglobin: 7.8 g/dL — ABNORMAL LOW (ref 12.0–15.0)
Immature Granulocytes: 1 %
Lymphocytes Relative: 11 %
Lymphs Abs: 0.7 10*3/uL (ref 0.7–4.0)
MCH: 30.4 pg (ref 26.0–34.0)
MCHC: 31.3 g/dL (ref 30.0–36.0)
MCV: 96.9 fL (ref 80.0–100.0)
Monocytes Absolute: 0.3 10*3/uL (ref 0.1–1.0)
Monocytes Relative: 5 %
Neutro Abs: 4.8 10*3/uL (ref 1.7–7.7)
Neutrophils Relative %: 77 %
Platelets: 223 10*3/uL (ref 150–400)
RBC: 2.57 MIL/uL — ABNORMAL LOW (ref 3.87–5.11)
RDW: 12.8 % (ref 11.5–15.5)
WBC: 6.2 10*3/uL (ref 4.0–10.5)
nRBC: 0 % (ref 0.0–0.2)

## 2022-06-12 LAB — IRON AND TIBC
Iron: 25 ug/dL — ABNORMAL LOW (ref 28–170)
Saturation Ratios: 7 % — ABNORMAL LOW (ref 10.4–31.8)
TIBC: 361 ug/dL (ref 250–450)
UIBC: 336 ug/dL

## 2022-06-12 LAB — SAMPLE TO BLOOD BANK

## 2022-06-12 LAB — VITAMIN B12: Vitamin B-12: 213 pg/mL (ref 180–914)

## 2022-06-12 LAB — FERRITIN: Ferritin: 12 ng/mL (ref 11–307)

## 2022-06-12 MED ORDER — SODIUM CHLORIDE 0.9 % IV SOLN
INTRAVENOUS | Status: DC | PRN
  Filled 2022-06-12: qty 250

## 2022-06-12 MED ORDER — SODIUM CHLORIDE 0.9 % IV SOLN
200.0000 mg | Freq: Once | INTRAVENOUS | Status: AC
  Administered 2022-06-12: 200 mg via INTRAVENOUS
  Filled 2022-06-12: qty 200

## 2022-06-12 NOTE — Progress Notes (Signed)
[place]  [phone]) [phone] Fax:(336) [phone]  ID: Lisa Mcneil OB: [birth year]  MR#: [id number]  WPY#:[id number]  Patient Care Team: Dion Body, MD as PCP - General (Family Medicine) Lloyd Huger, MD as Consulting Physician (Hematology and Oncology)  CHIEF COMPLAINT:  Iron deficiency anemia.  INTERVAL HISTORY: Patient returns to clinic today for repeat laboratory work, further evaluation, and consideration of additional IV Venofer.  She continues to be active and work full-time.  She does not complain of any weakness or fatigue. She has no neurologic complaints. She denies any recent fevers or illnesses. She has a good appetite and denies weight loss.  She denies any chest pain, shortness of breath, cough, or hemoptysis.  She denies any nausea, vomiting, consultation, or diarrhea. She has no melena or hematochezia.  She has no urinary complaints.  Patient offers no specific complaints today.  REVIEW OF SYSTEMS:   Review of Systems  Constitutional: Negative.  Negative for fever, malaise/fatigue and weight loss.  Respiratory: Negative.  Negative for cough and shortness of breath.   Cardiovascular: Negative.  Negative for chest pain and leg swelling.  Gastrointestinal: Negative.  Negative for abdominal pain, blood in stool and melena.  Genitourinary: Negative.  Negative for hematuria.  Musculoskeletal: Negative.  Negative for back pain.  Skin: Negative.  Negative for rash.  Neurological: Negative.  Negative for sensory change, focal weakness and weakness.  Psychiatric/Behavioral: Negative.  The patient is not nervous/anxious.     As per HPI. Otherwise, a complete review of systems is negative.  PAST MEDICAL HISTORY: Past Medical History:  Diagnosis Date   CHF (congestive heart failure) (HCC)    Diabetes mellitus without complication (HCC)    GERD (gastroesophageal reflux disease)    Hypertension    Insomnia    Iron deficiency anemia     RLS (restless legs syndrome)     PAST SURGICAL HISTORY: Past Surgical History:  Procedure Laterality Date   BLADDER REPAIR     CATARACT EXTRACTION W/PHACO Right 05/13/2016   Procedure: CATARACT EXTRACTION PHACO AND INTRAOCULAR LENS PLACEMENT ([place]);  Surgeon: Birder Robson, MD;  Location: [facility] ORS;  Service: Ophthalmology;  Laterality: Right;  [place] 1.09 AP% 21.0 CDE 14.60 Fluid Pack Lot # [id number]   CATARACT EXTRACTION W/PHACO Left 07/15/2016   Procedure: CATARACT EXTRACTION PHACO AND INTRAOCULAR LENS PLACEMENT ([facility]);  Surgeon: Birder Robson, MD;  Location: [facility] ORS;  Service: Ophthalmology;  Laterality: Left;  Lot# [id number] H [place]: 01:17.9 AP%:27.8 CDE: 21.67     FAMILY HISTORY: Reviewed and unchanged. No reported history of malignancy or chronic disease.     ADVANCED DIRECTIVES:    HEALTH MAINTENANCE: Social History   Tobacco Use   Smoking status: Former   Smokeless tobacco: Never  Scientific laboratory technician Use: Never used  Substance Use Topics   Alcohol use: No   Drug use: No     Colonoscopy:  PAP:  Bone density:  Lipid panel:  Allergies  Allergen Reactions   Atorvastatin Other (See Comments)   Codeine    Demerol [Meperidine]    Polysaccharide Iron Complex Other (See Comments)    Caused constipation   Naproxen Rash    Caused rash on mouth    Current Outpatient Medications  Medication Sig Dispense Refill   amLODipine (NORVASC) 10 MG tablet Take 10 mg by mouth daily.     Calcium Carbonate-Vit D-Min (CALTRATE 600+D PLUS MINERALS) 600-800 MG-UNIT CHEW Chew 1 tablet by mouth as directed.     ferrous sulfate 325 (  65 FE) MG tablet Take 325 mg by mouth daily.     gabapentin (NEURONTIN) 100 MG capsule Take 100 mg by mouth at bedtime.     lisinopril-hydrochlorothiazide (ZESTORETIC) 20-12.5 MG tablet Take 1 tablet by mouth daily.     metFORMIN (GLUCOPHAGE) 500 MG tablet Take 500 mg by mouth 2 (two) times daily with a meal.     Multiple Vitamins-Minerals (MULTIVITAMIN  WITH MINERALS) tablet Take 1 tablet by mouth daily.     omeprazole (PRILOSEC) 20 MG capsule Take 20 mg by mouth 2 (two) times daily.     rosuvastatin (CRESTOR) 5 MG tablet Take 5 mg by mouth at bedtime.     traZODone (DESYREL) 50 MG tablet Take 50 mg by mouth at bedtime.     No current facility-administered medications for this visit.   Facility-Administered Medications Ordered in Other Visits  Medication Dose Route Frequency Provider Last Rate Last Admin   0.9 %  sodium chloride infusion   Intravenous PRN Grayland Ormond, Kathlene November, MD       iron sucrose (VENOFER) 200 mg in sodium chloride 0.9 % 100 mL IVPB  200 mg Intravenous Once Lloyd Huger, MD        OBJECTIVE: Vitals:   06/12/22 1401  BP: (!) 143/64  Pulse: 93  Resp: 16  Temp: 97.9 F (36.6 C)  SpO2: 93%     Body mass index is 25.02 kg/m.    ECOG FS:0 - Asymptomatic  General: Well-developed, well-nourished, no acute distress. Eyes: Pink conjunctiva, anicteric sclera. HEENT: Normocephalic, moist mucous membranes. Lungs: No audible wheezing or coughing. Heart: Regular rate and rhythm. Abdomen: Soft, nontender, no obvious distention. Musculoskeletal: No edema, cyanosis, or clubbing. Neuro: Alert, answering all questions appropriately. Cranial nerves grossly intact. Skin: No rashes or petechiae noted. Psych: Normal affect.   LAB RESULTS:  Lab Results  Component Value Date   NA 136 01/02/2021   K 5.2 (H) 01/02/2021   CL 105 01/02/2021   CO2 22 01/02/2021   GLUCOSE 204 (H) 01/02/2021   BUN 37 (H) 01/02/2021   CREATININE 1.70 (H) 01/02/2021   CALCIUM 9.3 01/02/2021   PROT 7.8 02/16/2015   ALBUMIN 4.0 02/16/2015   AST 27 02/16/2015   ALT 18 02/16/2015   ALKPHOS 56 02/16/2015   BILITOT 0.3 02/16/2015   GFRNONAA 28 (L) 01/02/2021   GFRAA >60 02/17/2015    Lab Results  Component Value Date   WBC 6.2 06/12/2022   NEUTROABS 4.8 06/12/2022   HGB 7.8 (L) 06/12/2022   HCT 24.9 (L) 06/12/2022   MCV 96.9  06/12/2022   PLT 223 06/12/2022   Lab Results  Component Value Date   IRON 143 11/26/2021   TIBC 386 11/26/2021   IRONPCTSAT 37 (H) 11/26/2021   Lab Results  Component Value Date   FERRITIN 14 11/26/2021     STUDIES: No results found.  ASSESSMENT: Iron deficiency anemia.  PLAN:    1. Iron deficiency anemia: Although patient is asymptomatic, her hemoglobin has dropped to 7.8.  Iron stores are pending at time of dictation.  Proceed with 200 mg IV Venofer today.  Return to clinic 4 times over the next 2 weeks for treatment only.  Patient will then return to clinic in 4 months with repeat laboratory work, further evaluation, and continuation of treatment if needed.    I spent a total of 30 minutes reviewing chart data, face-to-face evaluation with the patient, counseling and coordination of care as detailed above.    Patient  expressed understanding and was in agreement with this plan. She also understands that She can call clinic at any time with any questions, concerns, or complaints.    Lloyd Huger, MD   06/12/2022 2:29 PM

## 2022-06-12 NOTE — Progress Notes (Signed)
No visible bleeding. Stools are dark from the oral iron. Appetite is good. Fatigue starts in middle of day. No dyspnea. No dizziness.

## 2022-06-13 ENCOUNTER — Inpatient Hospital Stay
Admission: EM | Admit: 2022-06-13 | Discharge: 2022-06-15 | DRG: 280 | Disposition: A | Discharge status: Home / Self Care | Payer: Medicare HMO | Attending: Internal Medicine | Admitting: Internal Medicine

## 2022-06-13 ENCOUNTER — Other Ambulatory Visit: Payer: Self-pay

## 2022-06-13 ENCOUNTER — Emergency Department: Payer: Medicare HMO

## 2022-06-13 ENCOUNTER — Encounter: Payer: Self-pay | Admitting: *Deleted

## 2022-06-13 DIAGNOSIS — Z87891 Personal history of nicotine dependence: Secondary | ICD-10-CM | POA: Diagnosis not present

## 2022-06-13 DIAGNOSIS — G2581 Restless legs syndrome: Secondary | ICD-10-CM | POA: Diagnosis present

## 2022-06-13 DIAGNOSIS — I110 Hypertensive heart disease with heart failure: Secondary | ICD-10-CM | POA: Diagnosis not present

## 2022-06-13 DIAGNOSIS — Z66 Do not resuscitate: Secondary | ICD-10-CM | POA: Diagnosis present

## 2022-06-13 DIAGNOSIS — Z9842 Cataract extraction status, left eye: Secondary | ICD-10-CM | POA: Diagnosis not present

## 2022-06-13 DIAGNOSIS — I5031 Acute diastolic (congestive) heart failure: Secondary | ICD-10-CM | POA: Diagnosis not present

## 2022-06-13 DIAGNOSIS — I509 Heart failure, unspecified: Secondary | ICD-10-CM | POA: Diagnosis not present

## 2022-06-13 DIAGNOSIS — N1832 Chronic kidney disease, stage 3b: Secondary | ICD-10-CM | POA: Diagnosis present

## 2022-06-13 DIAGNOSIS — K219 Gastro-esophageal reflux disease without esophagitis: Secondary | ICD-10-CM | POA: Diagnosis present

## 2022-06-13 DIAGNOSIS — D509 Iron deficiency anemia, unspecified: Secondary | ICD-10-CM | POA: Diagnosis not present

## 2022-06-13 DIAGNOSIS — I2489 Other forms of acute ischemic heart disease: Secondary | ICD-10-CM | POA: Diagnosis present

## 2022-06-13 DIAGNOSIS — E785 Hyperlipidemia, unspecified: Secondary | ICD-10-CM | POA: Diagnosis present

## 2022-06-13 DIAGNOSIS — R0902 Hypoxemia: Secondary | ICD-10-CM | POA: Diagnosis not present

## 2022-06-13 DIAGNOSIS — G4700 Insomnia, unspecified: Secondary | ICD-10-CM | POA: Diagnosis present

## 2022-06-13 DIAGNOSIS — Z79899 Other long term (current) drug therapy: Secondary | ICD-10-CM

## 2022-06-13 DIAGNOSIS — J439 Emphysema, unspecified: Secondary | ICD-10-CM | POA: Diagnosis not present

## 2022-06-13 DIAGNOSIS — E1129 Type 2 diabetes mellitus with other diabetic kidney complication: Secondary | ICD-10-CM | POA: Diagnosis present

## 2022-06-13 DIAGNOSIS — Z888 Allergy status to other drugs, medicaments and biological substances status: Secondary | ICD-10-CM

## 2022-06-13 DIAGNOSIS — Z20822 Contact with and (suspected) exposure to covid-19: Secondary | ICD-10-CM | POA: Diagnosis not present

## 2022-06-13 DIAGNOSIS — I130 Hypertensive heart and chronic kidney disease with heart failure and stage 1 through stage 4 chronic kidney disease, or unspecified chronic kidney disease: Secondary | ICD-10-CM | POA: Diagnosis not present

## 2022-06-13 DIAGNOSIS — I214 Non-ST elevation (NSTEMI) myocardial infarction: Secondary | ICD-10-CM | POA: Diagnosis present

## 2022-06-13 DIAGNOSIS — D500 Iron deficiency anemia secondary to blood loss (chronic): Secondary | ICD-10-CM | POA: Diagnosis present

## 2022-06-13 DIAGNOSIS — J9621 Acute and chronic respiratory failure with hypoxia: Secondary | ICD-10-CM | POA: Diagnosis present

## 2022-06-13 DIAGNOSIS — Z7984 Long term (current) use of oral hypoglycemic drugs: Secondary | ICD-10-CM

## 2022-06-13 DIAGNOSIS — R079 Chest pain, unspecified: Secondary | ICD-10-CM | POA: Diagnosis not present

## 2022-06-13 DIAGNOSIS — N184 Chronic kidney disease, stage 4 (severe): Secondary | ICD-10-CM | POA: Diagnosis not present

## 2022-06-13 DIAGNOSIS — Z885 Allergy status to narcotic agent status: Secondary | ICD-10-CM

## 2022-06-13 DIAGNOSIS — I5A Non-ischemic myocardial injury (non-traumatic): Secondary | ICD-10-CM

## 2022-06-13 DIAGNOSIS — I5033 Acute on chronic diastolic (congestive) heart failure: Secondary | ICD-10-CM | POA: Diagnosis present

## 2022-06-13 DIAGNOSIS — Z9841 Cataract extraction status, right eye: Secondary | ICD-10-CM

## 2022-06-13 DIAGNOSIS — J8410 Pulmonary fibrosis, unspecified: Secondary | ICD-10-CM | POA: Diagnosis not present

## 2022-06-13 DIAGNOSIS — R0789 Other chest pain: Secondary | ICD-10-CM | POA: Diagnosis not present

## 2022-06-13 DIAGNOSIS — I10 Essential (primary) hypertension: Secondary | ICD-10-CM | POA: Diagnosis not present

## 2022-06-13 DIAGNOSIS — E1169 Type 2 diabetes mellitus with other specified complication: Secondary | ICD-10-CM | POA: Diagnosis present

## 2022-06-13 DIAGNOSIS — J90 Pleural effusion, not elsewhere classified: Secondary | ICD-10-CM | POA: Diagnosis not present

## 2022-06-13 DIAGNOSIS — Z961 Presence of intraocular lens: Secondary | ICD-10-CM | POA: Diagnosis present

## 2022-06-13 DIAGNOSIS — R7989 Other specified abnormal findings of blood chemistry: Secondary | ICD-10-CM | POA: Diagnosis not present

## 2022-06-13 DIAGNOSIS — J811 Chronic pulmonary edema: Secondary | ICD-10-CM | POA: Diagnosis not present

## 2022-06-13 DIAGNOSIS — E1122 Type 2 diabetes mellitus with diabetic chronic kidney disease: Secondary | ICD-10-CM | POA: Diagnosis not present

## 2022-06-13 LAB — PROTIME-INR
INR: 1.1 (ref 0.8–1.2)
Prothrombin Time: 14.1 seconds (ref 11.4–15.2)

## 2022-06-13 LAB — URINALYSIS, ROUTINE W REFLEX MICROSCOPIC
Bilirubin Urine: NEGATIVE
Glucose, UA: NEGATIVE mg/dL
Hgb urine dipstick: NEGATIVE
Ketones, ur: NEGATIVE mg/dL
Nitrite: NEGATIVE
Protein, ur: NEGATIVE mg/dL
Specific Gravity, Urine: 1.01 (ref 1.005–1.030)
Squamous Epithelial / HPF: NONE SEEN (ref 0–5)
pH: 5 (ref 5.0–8.0)

## 2022-06-13 LAB — COMPREHENSIVE METABOLIC PANEL
ALT: 14 U/L (ref 0–44)
AST: 21 U/L (ref 15–41)
Albumin: 3.7 g/dL (ref 3.5–5.0)
Alkaline Phosphatase: 60 U/L (ref 38–126)
Anion gap: 10 (ref 5–15)
BUN: 34 mg/dL — ABNORMAL HIGH (ref 8–23)
CO2: 23 mmol/L (ref 22–32)
Calcium: 9.6 mg/dL (ref 8.9–10.3)
Chloride: 106 mmol/L (ref 98–111)
Creatinine, Ser: 1.44 mg/dL — ABNORMAL HIGH (ref 0.44–1.00)
GFR, Estimated: 34 mL/min — ABNORMAL LOW (ref >60)
Glucose, Bld: 158 mg/dL — ABNORMAL HIGH (ref 70–99)
Potassium: 4.1 mmol/L (ref 3.5–5.1)
Sodium: 139 mmol/L (ref 135–145)
Total Bilirubin: 0.5 mg/dL (ref 0.3–1.2)
Total Protein: 7.2 g/dL (ref 6.5–8.1)

## 2022-06-13 LAB — BLOOD GAS, VENOUS
Acid-base deficit: 1.2 mmol/L (ref 0.0–2.0)
Bicarbonate: 23.6 mmol/L (ref 20.0–28.0)
O2 Saturation: 66.6 %
Patient temperature: 37
pCO2, Ven: 39 mmHg — ABNORMAL LOW (ref 44–60)
pH, Ven: 7.39 (ref 7.25–7.43)
pO2, Ven: 45 mmHg (ref 32–45)

## 2022-06-13 LAB — BRAIN NATRIURETIC PEPTIDE: B Natriuretic Peptide: 513.3 pg/mL — ABNORMAL HIGH (ref 0.0–100.0)

## 2022-06-13 LAB — CBC WITH DIFFERENTIAL/PLATELET
Abs Immature Granulocytes: 0.04 10*3/uL (ref 0.00–0.07)
Basophils Absolute: 0 10*3/uL (ref 0.0–0.1)
Basophils Relative: 1 %
Eosinophils Absolute: 0.4 10*3/uL (ref 0.0–0.5)
Eosinophils Relative: 6 %
HCT: 25.1 % — ABNORMAL LOW (ref 36.0–46.0)
Hemoglobin: 7.9 g/dL — ABNORMAL LOW (ref 12.0–15.0)
Immature Granulocytes: 1 %
Lymphocytes Relative: 12 %
Lymphs Abs: 0.8 10*3/uL (ref 0.7–4.0)
MCH: 29.6 pg (ref 26.0–34.0)
MCHC: 31.5 g/dL (ref 30.0–36.0)
MCV: 94 fL (ref 80.0–100.0)
Monocytes Absolute: 0.4 10*3/uL (ref 0.1–1.0)
Monocytes Relative: 6 %
Neutro Abs: 4.8 10*3/uL (ref 1.7–7.7)
Neutrophils Relative %: 74 %
Platelets: 249 10*3/uL (ref 150–400)
RBC: 2.67 MIL/uL — ABNORMAL LOW (ref 3.87–5.11)
RDW: 13 % (ref 11.5–15.5)
WBC: 6.5 10*3/uL (ref 4.0–10.5)
nRBC: 0 % (ref 0.0–0.2)

## 2022-06-13 LAB — RESP PANEL BY RT-PCR (FLU A&B, COVID) ARPGX2
Influenza A by PCR: NEGATIVE
Influenza B by PCR: NEGATIVE
SARS Coronavirus 2 by RT PCR: NEGATIVE

## 2022-06-13 LAB — APTT: aPTT: 34 seconds (ref 24–36)

## 2022-06-13 LAB — TROPONIN I (HIGH SENSITIVITY)
Troponin I (High Sensitivity): 56 ng/L — ABNORMAL HIGH (ref <18)
Troponin I (High Sensitivity): 452 ng/L (ref <18)
Troponin I (High Sensitivity): 648 ng/L (ref <18)

## 2022-06-13 LAB — HEPARIN LEVEL (UNFRACTIONATED): Heparin Unfractionated: 0.16 IU/mL — ABNORMAL LOW (ref 0.30–0.70)

## 2022-06-13 LAB — CBG MONITORING, ED
Glucose-Capillary: 150 mg/dL — ABNORMAL HIGH (ref 70–99)
Glucose-Capillary: 133 mg/dL — ABNORMAL HIGH (ref 70–99)

## 2022-06-13 MED ORDER — ASPIRIN 81 MG PO TBEC
81.0000 mg | DELAYED_RELEASE_TABLET | Freq: Every day | ORAL | Status: DC
  Administered 2022-06-14 – 2022-06-15 (×2): 81 mg via ORAL
  Filled 2022-06-13 (×2): qty 1

## 2022-06-13 MED ORDER — FUROSEMIDE 10 MG/ML IJ SOLN
20.0000 mg | Freq: Two times a day (BID) | INTRAMUSCULAR | Status: DC
  Administered 2022-06-14 (×2): 20 mg via INTRAVENOUS
  Filled 2022-06-13: qty 4
  Filled 2022-06-13 (×2): qty 2

## 2022-06-13 MED ORDER — GABAPENTIN 100 MG PO CAPS
100.0000 mg | ORAL_CAPSULE | Freq: Every day | ORAL | Status: DC
  Administered 2022-06-13 – 2022-06-14 (×2): 100 mg via ORAL
  Filled 2022-06-13 (×2): qty 1

## 2022-06-13 MED ORDER — ONDANSETRON HCL 4 MG/2ML IJ SOLN: 4.0000 mg | Freq: Three times a day (TID) | INTRAMUSCULAR | Status: DC | PRN

## 2022-06-13 MED ORDER — HEPARIN BOLUS VIA INFUSION
1800.0000 [IU] | Freq: Once | INTRAVENOUS | Status: AC
  Administered 2022-06-14: 1800 [IU] via INTRAVENOUS
  Filled 2022-06-13: qty 1800

## 2022-06-13 MED ORDER — ROSUVASTATIN CALCIUM 5 MG PO TABS
5.0000 mg | ORAL_TABLET | Freq: Every day | ORAL | Status: DC
  Administered 2022-06-13 – 2022-06-14 (×2): 5 mg via ORAL
  Filled 2022-06-13 (×2): qty 1

## 2022-06-13 MED ORDER — OYSTER SHELL CALCIUM/D3 500-5 MG-MCG PO TABS
1.0000 | ORAL_TABLET | Freq: Every day | ORAL | Status: DC
  Administered 2022-06-13 – 2022-06-15 (×3): 1 via ORAL
  Filled 2022-06-13 (×3): qty 1

## 2022-06-13 MED ORDER — LISINOPRIL-HYDROCHLOROTHIAZIDE 20-12.5 MG PO TABS: 1.0000 | ORAL_TABLET | Freq: Every day | ORAL | Status: DC

## 2022-06-13 MED ORDER — TRAZODONE HCL 50 MG PO TABS
50.0000 mg | ORAL_TABLET | Freq: Every day | ORAL | Status: DC
  Administered 2022-06-13 – 2022-06-14 (×2): 50 mg via ORAL
  Filled 2022-06-13 (×2): qty 1

## 2022-06-13 MED ORDER — PANTOPRAZOLE SODIUM 40 MG PO TBEC
40.0000 mg | DELAYED_RELEASE_TABLET | Freq: Every day | ORAL | Status: DC
  Administered 2022-06-13 – 2022-06-15 (×3): 40 mg via ORAL
  Filled 2022-06-13 (×3): qty 1

## 2022-06-13 MED ORDER — FUROSEMIDE 10 MG/ML IJ SOLN
40.0000 mg | Freq: Once | INTRAMUSCULAR | Status: DC
  Administered 2022-06-13: 40 mg via INTRAVENOUS

## 2022-06-13 MED ORDER — ENOXAPARIN SODIUM 30 MG/0.3ML IJ SOSY: 30.0000 mg | PREFILLED_SYRINGE | INTRAMUSCULAR | Status: DC

## 2022-06-13 MED ORDER — IOHEXOL 350 MG/ML SOLN
75.0000 mL | Freq: Once | INTRAVENOUS | Status: AC | PRN
  Administered 2022-06-13: 50 mL via INTRAVENOUS

## 2022-06-13 MED ORDER — HEPARIN BOLUS VIA INFUSION
3600.0000 [IU] | Freq: Once | INTRAVENOUS | Status: AC
  Administered 2022-06-13: 3600 [IU] via INTRAVENOUS
  Filled 2022-06-13: qty 3600

## 2022-06-13 MED ORDER — ALBUTEROL SULFATE (2.5 MG/3ML) 0.083% IN NEBU: 2.5000 mL | INHALATION_SOLUTION | RESPIRATORY_TRACT | Status: DC | PRN

## 2022-06-13 MED ORDER — LISINOPRIL 20 MG PO TABS
20.0000 mg | ORAL_TABLET | Freq: Every day | ORAL | Status: DC
  Administered 2022-06-13 – 2022-06-15 (×3): 20 mg via ORAL
  Filled 2022-06-13 (×2): qty 1
  Filled 2022-06-13: qty 2

## 2022-06-13 MED ORDER — AMLODIPINE BESYLATE 10 MG PO TABS
10.0000 mg | ORAL_TABLET | Freq: Every day | ORAL | Status: DC
  Administered 2022-06-13 – 2022-06-15 (×3): 10 mg via ORAL
  Filled 2022-06-13 (×2): qty 1
  Filled 2022-06-13: qty 2

## 2022-06-13 MED ORDER — HEPARIN (PORCINE) 25000 UT/250ML-% IV SOLN
900.0000 [IU]/h | INTRAVENOUS | Status: DC
  Administered 2022-06-13: 750 [IU]/h via INTRAVENOUS
  Filled 2022-06-13: qty 250

## 2022-06-13 MED ORDER — FERROUS SULFATE 325 (65 FE) MG PO TABS
325.0000 mg | ORAL_TABLET | Freq: Every day | ORAL | Status: DC
  Administered 2022-06-13 – 2022-06-15 (×3): 325 mg via ORAL
  Filled 2022-06-13 (×3): qty 1

## 2022-06-13 MED ORDER — INSULIN ASPART 100 UNIT/ML IJ SOLN
0.0000 [IU] | Freq: Three times a day (TID) | INTRAMUSCULAR | Status: DC
  Administered 2022-06-13 (×2): 1 [IU] via SUBCUTANEOUS
  Filled 2022-06-13 (×4): qty 1

## 2022-06-13 MED ORDER — HYDRALAZINE HCL 20 MG/ML IJ SOLN: 5.0000 mg | INTRAMUSCULAR | Status: DC | PRN

## 2022-06-13 MED ORDER — NITROGLYCERIN 0.4 MG SL SUBL: 0.4000 mg | SUBLINGUAL_TABLET | SUBLINGUAL | Status: DC | PRN

## 2022-06-13 MED ORDER — ADULT MULTIVITAMIN W/MINERALS CH
1.0000 | ORAL_TABLET | Freq: Every day | ORAL | Status: DC
  Administered 2022-06-13 – 2022-06-15 (×3): 1 via ORAL
  Filled 2022-06-13 (×3): qty 1

## 2022-06-13 MED ORDER — HYDROCHLOROTHIAZIDE 12.5 MG PO TABS
12.5000 mg | ORAL_TABLET | Freq: Every day | ORAL | Status: DC
  Administered 2022-06-13 – 2022-06-15 (×3): 12.5 mg via ORAL
  Filled 2022-06-13 (×3): qty 1

## 2022-06-13 MED ORDER — ACETAMINOPHEN 325 MG PO TABS
650.0000 mg | ORAL_TABLET | Freq: Four times a day (QID) | ORAL | Status: DC | PRN
  Administered 2022-06-13 – 2022-06-14 (×2): 650 mg via ORAL
  Filled 2022-06-13 (×2): qty 2

## 2022-06-13 MED ORDER — DM-GUAIFENESIN ER 30-600 MG PO TB12: 1.0000 | ORAL_TABLET | Freq: Two times a day (BID) | ORAL | Status: DC | PRN

## 2022-06-13 MED ORDER — CALTRATE 600+D PLUS MINERALS 600-800 MG-UNIT PO CHEW: 1.0000 | CHEWABLE_TABLET | ORAL | Status: DC

## 2022-06-13 MED ORDER — INSULIN ASPART 100 UNIT/ML IJ SOLN: 0.0000 [IU] | Freq: Every day | INTRAMUSCULAR | Status: DC

## 2022-06-13 NOTE — ED Provider Notes (Signed)
[facility] Provider Note    Event Date/Time   First MD Initiated Contact with Patient 06/13/22 [phone]     (approximate)   History   Chest Pain   HPI  Sheana Bir is a 88 y.o. female with a history of CHF, hypertension, diabetes and iron deficiency anemia who presents with chest discomfort, acute onset around 2 or 3 AM today, on the right side of her chest, and described as tightness.  She feels slightly short of breath but denies cough or fever.  She has no lightheadedness or nausea.  She states she was feeling fine before going to bed yesterday.   I reviewed the past medical records.  The patient was seen by Dr. Grayland Ormond from hematology/oncology yesterday for her anemia, with a hemoglobin of 7.8 at that time.  She got a dose of IV venofer.     Physical Exam   Triage Vital Signs: ED Triage Vitals  Enc Vitals Group     BP      Pulse      Resp      Temp      Temp src      SpO2      Weight      Height      Head Circumference      Peak Flow      Pain Score      Pain Loc      Pain Edu?      Excl. in [place]?     Most recent vital signs: Vitals:   06/13/22 1430 06/13/22 1500  BP: (!) 168/52 (!) 165/59  Pulse: 75 70  Resp: 15 20  Temp:    SpO2: 99% 97%     General: Alert and oriented, no distress.  CV:  Good peripheral perfusion. Systolic murmur.  Resp:  Slightly increased effort.  Coarse breath sounds and some rales bilaterally.  Abd:  No distention.  Other:  No peripheral edema.     ED Results / Procedures / Treatments   Labs (all labs ordered are listed, but only abnormal results are displayed) Labs Reviewed  BLOOD GAS, VENOUS - Abnormal; Notable for the following components:      Result Value   pCO2, Ven 39 (*)    All other components within normal limits  COMPREHENSIVE METABOLIC PANEL - Abnormal; Notable for the following components:   Glucose, Bld 158 (*)    BUN 34 (*)    Creatinine, Ser 1.44 (*)    GFR, Estimated 34  (*)    All other components within normal limits  CBC WITH DIFFERENTIAL/PLATELET - Abnormal; Notable for the following components:   RBC 2.67 (*)    Hemoglobin 7.9 (*)    HCT 25.1 (*)    All other components within normal limits  BRAIN NATRIURETIC PEPTIDE - Abnormal; Notable for the following components:   B Natriuretic Peptide 513.3 (*)    All other components within normal limits  URINALYSIS, ROUTINE W REFLEX MICROSCOPIC - Abnormal; Notable for the following components:   Color, Urine STRAW (*)    APPearance CLEAR (*)    Leukocytes,Ua SMALL (*)    Bacteria, UA RARE (*)    All other components within normal limits  CBG MONITORING, ED - Abnormal; Notable for the following components:   Glucose-Capillary 150 (*)    All other components within normal limits  TROPONIN I (HIGH SENSITIVITY) - Abnormal; Notable for the following components:   Troponin I (High Sensitivity) 56 (*)  All other components within normal limits  TROPONIN I (HIGH SENSITIVITY) - Abnormal; Notable for the following components:   Troponin I (High Sensitivity) 452 (*)    All other components within normal limits  TROPONIN I (HIGH SENSITIVITY) - Abnormal; Notable for the following components:   Troponin I (High Sensitivity) 648 (*)    All other components within normal limits  RESP PANEL BY RT-PCR (FLU A&B, COVID) ARPGX2  APTT  PROTIME-INR  HEMOGLOBIN A1C     EKG  ED ECG REPORT I, Arta Silence, the attending physician, personally viewed and interpreted this ECG.  Date: 06/13/2022 EKG Time: 0713 Rate: 95 Rhythm: normal sinus rhythm QRS Axis: normal Intervals: normal ST/T Wave abnormalities: Nonspecific ST abnormalities laterally Narrative Interpretation: no evidence of acute ischemia; no significant change from EKG of 01/02/2021   RADIOLOGY  Chest X-ray: I independently viewed and interpreted the images; there are bilateral interstitial opacities consistent with mild  edema   PROCEDURES:  Critical Care performed: No  Procedures   MEDICATIONS ORDERED IN ED: Medications  albuterol (PROVENTIL) (2.5 MG/3ML) 0.083% nebulizer solution 2.5 mL (has no administration in time range)  dextromethorphan-guaiFENesin (MUCINEX DM) 30-600 MG per 12 hr tablet 1 tablet (has no administration in time range)  aspirin EC tablet 81 mg (has no administration in time range)  ondansetron (ZOFRAN) injection 4 mg (has no administration in time range)  acetaminophen (TYLENOL) tablet 650 mg (has no administration in time range)  hydrALAZINE (APRESOLINE) injection 5 mg (has no administration in time range)  insulin aspart (novoLOG) injection 0-9 Units (1 Units Subcutaneous Given 06/13/22 1241)  insulin aspart (novoLOG) injection 0-5 Units (has no administration in time range)  furosemide (LASIX) injection 20 mg (20 mg Intravenous Not Given 06/13/22 1148)  heparin ADULT infusion 100 units/mL (25000 units/242mL) (750 Units/hr Intravenous New Bag/Given 06/13/22 1356)  nitroGLYCERIN (NITROSTAT) SL tablet 0.4 mg (has no administration in time range)  traZODone (DESYREL) tablet 50 mg (has no administration in time range)  pantoprazole (PROTONIX) EC tablet 40 mg (40 mg Oral Given 06/13/22 1523)  amLODipine (NORVASC) tablet 10 mg (10 mg Oral Given 06/13/22 1523)  rosuvastatin (CRESTOR) tablet 5 mg (has no administration in time range)  ferrous sulfate tablet 325 mg (325 mg Oral Given 06/13/22 1523)  gabapentin (NEURONTIN) capsule 100 mg (has no administration in time range)  multivitamin with minerals tablet 1 tablet (1 tablet Oral Given 06/13/22 1523)  lisinopril (ZESTRIL) tablet 20 mg (20 mg Oral Given 06/13/22 1523)    And  hydrochlorothiazide (HYDRODIURIL) tablet 12.5 mg (12.5 mg Oral Given 06/13/22 1523)  calcium-vitamin D (OSCAL WITH D) 500-5 MG-MCG per tablet 1 tablet (1 tablet Oral Given 06/13/22 1523)  iohexol (OMNIPAQUE) 350 MG/ML injection 75 mL (50 mLs Intravenous Contrast Given  06/13/22 0943)  heparin bolus via infusion 3,600 Units (3,600 Units Intravenous Bolus from Bag 06/13/22 1356)     IMPRESSION / MDM / ASSESSMENT AND PLAN / ED COURSE  I reviewed the triage vital signs and the nursing notes.  88 year old female with PMH as noted above presents with R sided chest tightness and some shortness of breath.  On exam she is found to be hypoxic to the low 80s on room air, and there are some coarse breath sounds and rales.    Differential diagnosis includes, but is not limited to, pulmonary edema, ACS, pneumonia, COVID-19 or other viral etiology, acute bronchitis, less likely PE.  We will obtain lab workup including cardiac enzymes and BNP to rule  out acute CHF, VBG, chest-xray, and reassess.  The patient is currently on 4L O2 by nasal cannula.    Patient's presentation is most consistent with acute presentation with potential threat to life or bodily function.  The patient is on the cardiac monitor to evaluate for evidence of arrhythmia and/or significant heart rate changes.  ----------------------------------------- 11:00 PM on 06/13/2022 -----------------------------------------  Chest x-ray showed mild edema but given the patient's significant hypoxia on arrival I obtained a CT angio chest to rule out PE.  This is negative for PE but does confirm edema.  BNP is somewhat elevated.  There is no leukocytosis.  Respiratory panel is negative.  The patient remains hemodynamically stable and is on O2 by nasal cannula.  I ordered IV Lasix.  I consulted Dr. Blaine Hamper from the hospitalist service; based on her discussion he agrees to admit the patient.     FINAL CLINICAL IMPRESSION(S) / ED DIAGNOSES   Final diagnoses:  Acute on chronic congestive heart failure, unspecified heart failure type (HCC)  Acute on chronic respiratory failure with hypoxia ([place])     Rx / DC Orders   ED Discharge Orders     None        Note:  This document was prepared using Dragon voice  recognition software and may include unintentional dictation errors.    Arta Silence, MD 06/13/22 1547

## 2022-06-13 NOTE — Consult Note (Signed)
CARDIOLOGY CONSULT NOTE               Patient ID: Lisa Mcneil MRN: [id number] DOB/AGE: May 09, 1928 88 y.o.  Admit date: 06/13/2022 Referring Physician Dr Blaine Hamper, [facility] hospitalist Primary Physician Dr. Netty Starring primary Primary Cardiologist Fath, Reason for Consultation elevated troponin chest pain possible unstable angina diastolic heart failure  HPI: Is a 88 year old female history of diastolic congestive heart failure hypertension hyperlipidemia diabetes GERD anemia chronic renal sufficiency stage III presented with chest tightness weakness fatigue shortness of breath was found to have sats in the 80s placed on supplemental oxygen chest x-ray suggested mild pulmonary edema BNP was elevated over 500 patient had negative COVID and influenza patient was then advised to be admitted overnight troponins were elevated about 5 or 600 EKG was nondiagnostic no previous cardiac history cardiology was consulted with elevated troponin chest pain shortness of breath and evidence of heart failure  Review of systems complete and found to be negative unless listed above     Past Medical History:  Diagnosis Date   CHF (congestive heart failure) (HCC)    Diabetes mellitus without complication (HCC)    GERD (gastroesophageal reflux disease)    Hypertension    Insomnia    Iron deficiency anemia    RLS (restless legs syndrome)     Past Surgical History:  Procedure Laterality Date   BLADDER REPAIR     CATARACT EXTRACTION W/PHACO Right 05/13/2016   Procedure: CATARACT EXTRACTION PHACO AND INTRAOCULAR LENS PLACEMENT ([place]);  Surgeon: Birder Robson, MD;  Location: [facility] ORS;  Service: Ophthalmology;  Laterality: Right;  [place] 1.09 AP% 21.0 CDE 14.60 Fluid Pack Lot # [id number]   CATARACT EXTRACTION W/PHACO Left 07/15/2016   Procedure: CATARACT EXTRACTION PHACO AND INTRAOCULAR LENS PLACEMENT ([facility]);  Surgeon: Birder Robson, MD;  Location: [facility] ORS;  Service: Ophthalmology;  Laterality: Left;   Lot# [id number] H [place]: 01:17.9 AP%:27.8 CDE: 21.67     (Not in a hospital admission)  Social History   Socioeconomic History   Marital status: Single    Spouse name: Not on file   Number of children: Not on file   Years of education: Not on file   Highest education level: Not on file  Occupational History   Not on file  Tobacco Use   Smoking status: Former   Smokeless tobacco: Never  Vaping Use   Vaping Use: Never used  Substance and Sexual Activity   Alcohol use: No   Drug use: No   Sexual activity: Not on file  Other Topics Concern   Not on file  Social History Narrative   Not on file   Social Determinants of Health   Financial Resource Strain: Not on file  Food Insecurity: Not on file  Transportation Needs: Not on file  Physical Activity: Not on file  Stress: Not on file  Social Connections: Not on file  Intimate Partner Violence: Not on file    History reviewed. No pertinent family history.    Review of systems complete and found to be negative unless listed above      PHYSICAL EXAM  General: Well developed, well nourished, in no acute distress HEENT:  Normocephalic and atramatic Neck:  No JVD.  Lungs: Clear bilaterally to auscultation and percussion. Heart: HRRR . Normal S1 and S2 without gallops or murmurs.  Abdomen: Bowel sounds are positive, abdomen soft and non-tender  Msk:  Back normal, normal gait. Normal strength and tone for age. Extremities: No clubbing, cyanosis or edema.   Neuro:  Alert and oriented X 3. Psych:  Good affect, responds appropriately  Labs:   Lab Results  Component Value Date   WBC 6.5 06/13/2022   HGB 7.9 (L) 06/13/2022   HCT 25.1 (L) 06/13/2022   MCV 94.0 06/13/2022   PLT 249 06/13/2022    Recent Labs  Lab 06/13/22 0720  NA 139  K 4.1  CL 106  CO2 23  BUN 34*  CREATININE 1.44*  CALCIUM 9.6  PROT 7.2  BILITOT 0.5  ALKPHOS 60  ALT 14  AST 21  GLUCOSE 158*   No results found for: "CKTOTAL", "CKMB",  "CKMBINDEX", "TROPONINI" No results found for: "CHOL" No results found for: "HDL" No results found for: "LDLCALC" No results found for: "TRIG" No results found for: "CHOLHDL" No results found for: "LDLDIRECT"    Radiology: CT Angio Chest PE W and/or Wo Contrast  Result Date: 06/13/2022 CLINICAL DATA:  Pulmonary embolism (PE) suspected, high prob EXAM: CT ANGIOGRAPHY CHEST WITH CONTRAST TECHNIQUE: Multidetector CT imaging of the chest was performed using the standard protocol during bolus administration of intravenous contrast. Multiplanar CT image reconstructions and MIPs were obtained to evaluate the vascular anatomy. RADIATION DOSE REDUCTION: This exam was performed according to the departmental dose-optimization program which includes automated exposure control, adjustment of the mA and/or kV according to patient size and/or use of iterative reconstruction technique. CONTRAST:  52mL OMNIPAQUE IOHEXOL 350 MG/ML SOLN COMPARISON:  CT a chest 11/27/2020. FINDINGS: Cardiovascular: Satisfactory opacification of the pulmonary arteries to the segmental level. No evidence of pulmonary embolism. Mild cardiomegaly.No pericardial disease. Coronary artery atherosclerosis. Moderate atherosclerosis of the thoracic aorta. Mediastinum/Nodes: Mildly prominent mediastinal lymph nodes, likely reactive. The thyroid is unremarkable. Esophagus is unremarkable. Lungs/Pleura: Moderate centrilobular and paraseptal emphysema. Mild diffuse bronchial wall thickening, lower lung predominant. There is diffuse interlobular septal thickening and ground-glass opacities bilaterally. There are more confluent airspace consolidations in the lung bases, likely atelectasis. There are trace pleural effusions, right greater than left. No evidence of pneumothorax. Upper Abdomen: No acute findings. Calcified granulomas in the spleen. Musculoskeletal: Unchanged mild chronic T2 superior endplate compression deformity. No acute osseous abnormality.  No suspicious osseous lesion. Review of the MIP images confirms the above findings. IMPRESSION: Cardiomegaly with moderate pulmonary edema and trace pleural effusions. Bibasilar airspace consolidations favored to be atelectasis. No evidence of pulmonary embolism. Emphysema. Electronically Signed   By: Maurine Simmering M.D.   On: 06/13/2022 10:04   DG Chest Port 1 View  Result Date: 06/13/2022 CLINICAL DATA:  RIGHT-sided chest pain. EXAM: PORTABLE CHEST 1 VIEW COMPARISON:  Chest XR and CTA PE, 11/27/2020. FINDINGS: Cardiac silhouette is mildly enlarged. Aortic arch calcifications. Lungs are hypoinflated. Basilar and perihilar-predominant interstitial thickening. No discrete consolidation. Trivial RIGHT pleural effusion. No pneumothorax. No acute displaced fracture. IMPRESSION: 1. Cardiomegaly with mild interstitial pulmonary edema. 2.  Aortic Atherosclerosis (ICD10-I70.0). Electronically Signed   By: Michaelle Birks M.D.   On: 06/13/2022 07:49    EKG: Normal sinus rhythm nonspecific ST changes LVH by voltage rate of 90  ASSESSMENT AND PLAN:  Chest pain Possible unstable angina Elevated troponins Possible non-STEMI Hypertension Congestive heart failure diastolic dysfunction Diabetes GERD Anemia Chronic renal insufficiency stage III . Plan Agreed admit to telemetry rule out myocardial infarction follow-up troponins and EKGs Echocardiogram for assessment of ventricular function wall motion Supplemental oxygen therapy for hypoxemia probably related to heart failure Diuresis with Lasix and beta-blocker continue ACE inhibitor We will add nitrate possibly Imdur for possible anginal type symptoms Continue diabetes  management and control Follow-up renal insufficiency avoid nephrotoxic drugs Consider functional study versus cardiac cath for elevated troponins Troponins could possibly related to demand ischemia but non-STEMI has to be strongly considered Recommend transfusing to hemoglobin to 8 or  9  Signed: Yolonda Kida MD 06/13/2022, 2:42 PM

## 2022-06-13 NOTE — ED Notes (Signed)
Admitting at BS

## 2022-06-13 NOTE — ED Notes (Signed)
Call back from hospitalist received, acknowledged troponin 452

## 2022-06-13 NOTE — ED Notes (Signed)
EDP at BS 

## 2022-06-13 NOTE — Consult Note (Addendum)
ANTICOAGULATION CONSULT NOTE  Pharmacy Consult for Heparin Indication: chest pain/ACS  Allergies  Allergen Reactions   Atorvastatin Other (See Comments)   Codeine    Demerol [Meperidine]    Polysaccharide Iron Complex Other (See Comments)    Caused constipation   Naproxen Rash    Caused rash on mouth    Patient Measurements: Height: 5\' 3"  (160 cm) Weight: 59.9 kg (132 lb) IBW/kg (Calculated) : 52.4 Heparin Dosing Weight: 59.9 kg  Vital Signs: Temp: 97.7 F (36.5 C) (11/03 2128) Temp Source: Oral (11/03 1901) BP: 139/46 (11/03 2128) Pulse Rate: 68 (11/03 2128)  Labs: Recent Labs    06/12/22 1321 06/13/22 0720 06/13/22 1215 06/13/22 1400 06/13/22 2226  HGB 7.8* 7.9*  --   --   --   HCT 24.9* 25.1*  --   --   --   PLT 223 249  --   --   --   APTT  --  34  --   --   --   LABPROT  --  14.1  --   --   --   INR  --  1.1  --   --   --   HEPARINUNFRC  --   --   --   --  0.16*  CREATININE  --  1.44*  --   --   --   TROPONINIHS  --  56* 452* 648*  --      Estimated Creatinine Clearance: 21 mL/min (A) (by C-G formula based on SCr of 1.44 mg/dL (H)).   Medical History: Past Medical History:  Diagnosis Date   CHF (congestive heart failure) (HCC)    Diabetes mellitus without complication (HCC)    GERD (gastroesophageal reflux disease)    Hypertension    Insomnia    Iron deficiency anemia    RLS (restless legs syndrome)     Medications:  No history of chronic anticoagulant use PTA  Assessment: Pharmacy has been consulted to initiate and titrate heparin infusion in 88yo female with PMH of diastolic CHF, HTN, HLD, and T2DM who presented to the ED with chest pain. Troponin levels 56>452. Cardiology consulted for possible catheterization.  Baseline labs: aPTT 34 sec, INR 1.1, Plts 249, Hgb 14.1  Goal of Therapy:  Heparin level 0.3-0.7 units/ml Monitor platelets by anticoagulation protocol: Yes   11/03 2226 HL 0.16 subtherapeutic  Plan:  Give 1800 units bolus  x 1 Increase heparin infusion from 750 to 900 units/hr Check anti-Xa level in 8 hours and daily while on heparin Continue to monitor H&H and platelets  Renda Rolls, PharmD, [facility] 06/13/2022 11:13 PM

## 2022-06-13 NOTE — H&P (Signed)
History and Physical    [name] OXB:[id number] DOB: 1928/07/29 DOA: 06/13/2022  Referring MD/NP/PA:   PCP: Dion Body, MD   Patient coming from:  The patient is coming from home.    Chief Complaint: Chest tightness  HPI: Lisa Mcneil is a 88 y.o. female with medical history significant of diastolic CHF, hypertension, hyperlipidemia, diabetes mellitus, GERD, iron deficiency anemia, CKD-3B, who presents with chest tightness.  Patient states that she started having chest tightness in the early morning, which was pressure-like, moderate, nonradiating.  She states that her chest tightness has resolved now.  Patient denies shortness of breath, but was found to have oxygen desaturation to 80s% on room air in ED, which improved to 99% on 2 L oxygen.  Patient normally is not using oxygen at home.  Patient does not have cough, fever, chills.  No nausea, vomiting, diarrhea or abdominal pain.  No symptoms of UTI.  Data reviewed independently and ED Course: pt was found to have BNP 513, troponin level 56, negative COVID PCR, stable renal function, temperature normal, blood pressure 152/50, heart rate 88, RR 27, 15.  Chest x-ray showed cardiomegaly and interstitial pulm edema.  CTA negative for PE, but showed cardiomegaly and pulmonary edema and possible bilateral basilar atelectasis.  Patient is admitted to telemetry bed as inpatient.   EKG: I have personally reviewed.  Sinus rhythm, QTc 435, left atrial enlargement, ST depression in V5-V6.   Review of Systems:   General: no fevers, chills, no body weight gain, fatigue HEENT: no blurry vision, hearing changes or sore throat Respiratory: no dyspnea, coughing, wheezing CV: Has chest tightness, no palpitations GI: no nausea, vomiting, abdominal pain, diarrhea, constipation GU: no dysuria, burning on urination, increased urinary frequency, hematuria  Ext: Has trace leg edema Neuro: no unilateral weakness, numbness, or  tingling, no vision change or hearing loss Skin: no rash, no skin tear. MSK: No muscle spasm, no deformity, no limitation of range of movement in spin Heme: No easy bruising.  Travel history: No recent long distant travel.   Allergy:  Allergies  Allergen Reactions   Atorvastatin Other (See Comments)   Codeine    Demerol [Meperidine]    Polysaccharide Iron Complex Other (See Comments)    Caused constipation   Naproxen Rash    Caused rash on mouth    Past Medical History:  Diagnosis Date   CHF (congestive heart failure) (HCC)    Diabetes mellitus without complication (HCC)    GERD (gastroesophageal reflux disease)    Hypertension    Insomnia    Iron deficiency anemia    RLS (restless legs syndrome)     Past Surgical History:  Procedure Laterality Date   BLADDER REPAIR     CATARACT EXTRACTION W/PHACO Right 05/13/2016   Procedure: CATARACT EXTRACTION PHACO AND INTRAOCULAR LENS PLACEMENT ([place]);  Surgeon: Birder Robson, MD;  Location: [facility] ORS;  Service: Ophthalmology;  Laterality: Right;  [place] 1.09 AP% 21.0 CDE 14.60 Fluid Pack Lot # [id number]   CATARACT EXTRACTION W/PHACO Left 07/15/2016   Procedure: CATARACT EXTRACTION PHACO AND INTRAOCULAR LENS PLACEMENT ([facility]);  Surgeon: Birder Robson, MD;  Location: [facility] ORS;  Service: Ophthalmology;  Laterality: Left;  Lot# [id number] H [place]: 01:17.9 AP%:27.8 CDE: 21.67     Social History:  reports that she has quit smoking. She has never used smokeless tobacco. She reports that she does not drink alcohol and does not use drugs.  Family History: History reviewed. No pertinent family history.   Prior to Admission medications  Medication Sig Start Date End Date Taking? Authorizing Provider  estradiol (ESTRACE) 0.1 MG/GM vaginal cream SMARTSIG:Gram(s) Vaginal Twice a Week 05/09/22  Yes [provider]  amLODipine (NORVASC) 10 MG tablet Take 10 mg by mouth daily.    [provider]  Calcium Carbonate-Vit D-Min (CALTRATE  600+D PLUS MINERALS) 600-800 MG-UNIT CHEW Chew 1 tablet by mouth as directed.    [provider]  ferrous sulfate 325 (65 FE) MG tablet Take 325 mg by mouth daily.    [provider]  gabapentin (NEURONTIN) 100 MG capsule Take 100 mg by mouth at bedtime.    [provider]  lisinopril-hydrochlorothiazide (ZESTORETIC) 20-12.5 MG tablet Take 1 tablet by mouth daily.    [provider]  metFORMIN (GLUCOPHAGE) 500 MG tablet Take 500 mg by mouth 2 (two) times daily with a meal.    [provider]  Multiple Vitamins-Minerals (MULTIVITAMIN WITH MINERALS) tablet Take 1 tablet by mouth daily.    [provider]  omeprazole (PRILOSEC) 20 MG capsule Take 20 mg by mouth 2 (two) times daily.    [provider]  rosuvastatin (CRESTOR) 5 MG tablet Take 5 mg by mouth at bedtime.    [provider]  traZODone (DESYREL) 50 MG tablet Take 50 mg by mouth at bedtime.    [provider]    Physical Exam: Vitals:   06/13/22 1216 06/13/22 1217 06/13/22 1230 06/13/22 1300  BP:   125/69 (!) 137/46  Pulse:  83 78 74  Resp: 19 19 16  (!) 25  Temp:      TempSrc:      SpO2: (!) 83% 97% 98% 99%  Weight:      Height:       General: Not in acute distress HEENT:       Eyes: PERRL, EOMI, no scleral icterus.       ENT: No discharge from the ears and nose, no pharynx injection, no tonsillar enlargement.        Neck: positive JVD, no bruit, no mass felt. Heme: No neck lymph node enlargement. Cardiac: J8/S5, RRR, 1/6 systolic murmurs, No gallops or rubs. Respiratory: No rales, wheezing, rhonchi or rubs. GI: Soft, nondistended, nontender, no rebound pain, no organomegaly, BS present. GU: No hematuria Ext: has trace leg edema bilaterally. 1+DP/PT pulse bilaterally. Musculoskeletal: No joint deformities, No joint redness or warmth, no limitation of ROM in spin. Skin: No rashes.  Neuro: Alert, oriented X3, cranial nerves II-XII grossly intact,  moves all extremities normally.  Psych: Patient is not psychotic, no suicidal or hemocidal ideation.  Labs on Admission: I have personally reviewed following labs and imaging studies  CBC: Recent Labs  Lab 06/12/22 1321 06/13/22 0720  WBC 6.2 6.5  NEUTROABS 4.8 4.8  HGB 7.8* 7.9*  HCT 24.9* 25.1*  MCV 96.9 94.0  PLT 223 053   Basic Metabolic Panel: Recent Labs  Lab 06/13/22 0720  NA 139  K 4.1  CL 106  CO2 23  GLUCOSE 158*  BUN 34*  CREATININE 1.44*  CALCIUM 9.6   GFR: Estimated Creatinine Clearance: 21 mL/min (A) (by C-G formula based on SCr of 1.44 mg/dL (H)). Liver Function Tests: Recent Labs  Lab 06/13/22 0720  AST 21  ALT 14  ALKPHOS 60  BILITOT 0.5  PROT 7.2  ALBUMIN 3.7   No results for input(s): "LIPASE", "AMYLASE" in the last 168 hours. No results for input(s): "AMMONIA" in the last 168 hours. Coagulation Profile: Recent Labs  Lab 06/13/22 0720  INR 1.1   Cardiac Enzymes: No results for input(s): "CKTOTAL", "CKMB", "CKMBINDEX", "TROPONINI" in the last 168 hours. BNP (last 3 results) No results for input(s): "PROBNP" in the last 8760 hours. HbA1C: No results for input(s): "HGBA1C" in the last 72 hours. CBG: Recent Labs  Lab 06/13/22 1213  GLUCAP 150*   Lipid Profile: No results for input(s): "CHOL", "HDL", "LDLCALC", "TRIG", "CHOLHDL", "LDLDIRECT" in the last 72 hours. Thyroid Function Tests: No results for input(s): "TSH", "T4TOTAL", "FREET4", "T3FREE", "THYROIDAB" in the last 72 hours. Anemia Panel: Recent Labs    06/12/22 1321  VITAMINB12 213  FERRITIN 12  TIBC 361  IRON 25*   Urine analysis:    Component Value Date/Time   COLORURINE YELLOW (A) 01/02/2021 1540   APPEARANCEUR CLOUDY (A) 01/02/2021 1540   APPEARANCEUR Cloudy 06/23/2014 2320   LABSPEC 1.013 01/02/2021 1540   LABSPEC 1.015 06/23/2014 2320   PHURINE 5.0 01/02/2021 1540   GLUCOSEU NEGATIVE 01/02/2021 1540   GLUCOSEU Negative 06/23/2014 2320   HGBUR NEGATIVE  01/02/2021 1540   [place] 01/02/2021 1540   BILIRUBINUR Negative 06/23/2014 2320   KETONESUR NEGATIVE 01/02/2021 1540   PROTEINUR NEGATIVE 01/02/2021 1540   NITRITE POSITIVE (A) 01/02/2021 1540   LEUKOCYTESUR LARGE (A) 01/02/2021 1540   LEUKOCYTESUR 3+ 06/23/2014 2320   Sepsis Labs: @LABRCNTIP (procalcitonin:4,lacticidven:4) ) Recent Results (from the past 240 hour(s))  Resp Panel by RT-PCR (Flu A&B, Covid) Anterior Nasal Swab     Status: None   Collection Time: 06/13/22  7:49 AM   Specimen: Anterior Nasal Swab  Result Value Ref Range Status   SARS Coronavirus 2 by RT PCR NEGATIVE NEGATIVE Final    Comment: (NOTE) SARS-CoV-2 target nucleic acids are NOT DETECTED.  The SARS-CoV-2 RNA is generally detectable in upper respiratory specimens during the acute phase of infection. The lowest concentration of SARS-CoV-2 viral copies this assay can detect is 138 copies/mL. A negative result does not preclude SARS-Cov-2 infection and should not be used as the sole basis for treatment or other patient management decisions. A negative result may occur with  improper specimen collection/handling, submission of specimen other than nasopharyngeal swab, presence of viral mutation(s) within the areas targeted by this assay, and inadequate number of viral copies(<138 copies/mL). A negative result must be combined with clinical observations, patient history, and epidemiological information. The expected result is Negative.  Fact Sheet for Patients:  EntrepreneurPulse.com.au  Fact Sheet for Healthcare Providers:  IncredibleEmployment.be  This test is no t yet approved or cleared by the [place] FDA and  has been authorized for detection and/or diagnosis of SARS-CoV-2 by FDA under an Emergency Use Authorization (EUA). This EUA will remain  in effect (meaning this test can be used) for the duration of the COVID-19 declaration under Section  564(b)(1) of the Act, 21 U.S.C.section 360bbb-3(b)(1), unless the authorization is terminated  or revoked sooner.       Influenza A by PCR NEGATIVE NEGATIVE Final   Influenza B by PCR NEGATIVE NEGATIVE Final    Comment: (NOTE) The Xpert Xpress SARS-CoV-2/FLU/RSV plus assay is intended as an aid in the diagnosis of influenza from Nasopharyngeal swab specimens and should not be used as a sole basis for treatment. Nasal washings and aspirates are unacceptable for Xpert Xpress SARS-CoV-2/FLU/RSV testing.  Fact Sheet for Patients: EntrepreneurPulse.com.au  Fact Sheet for Healthcare Providers: IncredibleEmployment.be  This test is not yet approved or cleared by the [place] FDA and has been authorized for detection and/or diagnosis of SARS-CoV-2 by FDA  under an Emergency Use Authorization (EUA). This EUA will remain in effect (meaning this test can be used) for the duration of the COVID-19 declaration under Section 564(b)(1) of the Act, 21 U.S.C. section 360bbb-3(b)(1), unless the authorization is terminated or revoked.  Performed at [facility], [address] [place], [place] [address]      Radiological Exams on Admission: CT Angio Chest PE W and/or Wo Contrast  Result Date: 06/13/2022 CLINICAL DATA:  Pulmonary embolism (PE) suspected, high prob EXAM: CT ANGIOGRAPHY CHEST WITH CONTRAST TECHNIQUE: Multidetector CT imaging of the chest was performed using the standard protocol during bolus administration of intravenous contrast. Multiplanar CT image reconstructions and MIPs were obtained to evaluate the vascular anatomy. RADIATION DOSE REDUCTION: This exam was performed according to the departmental dose-optimization program which includes automated exposure control, adjustment of the mA and/or kV according to patient size and/or use of iterative reconstruction technique. CONTRAST:  63mL OMNIPAQUE IOHEXOL 350 MG/ML SOLN COMPARISON:   CT a chest 11/27/2020. FINDINGS: Cardiovascular: Satisfactory opacification of the pulmonary arteries to the segmental level. No evidence of pulmonary embolism. Mild cardiomegaly.No pericardial disease. Coronary artery atherosclerosis. Moderate atherosclerosis of the thoracic aorta. Mediastinum/Nodes: Mildly prominent mediastinal lymph nodes, likely reactive. The thyroid is unremarkable. Esophagus is unremarkable. Lungs/Pleura: Moderate centrilobular and paraseptal emphysema. Mild diffuse bronchial wall thickening, lower lung predominant. There is diffuse interlobular septal thickening and ground-glass opacities bilaterally. There are more confluent airspace consolidations in the lung bases, likely atelectasis. There are trace pleural effusions, right greater than left. No evidence of pneumothorax. Upper Abdomen: No acute findings. Calcified granulomas in the spleen. Musculoskeletal: Unchanged mild chronic T2 superior endplate compression deformity. No acute osseous abnormality. No suspicious osseous lesion. Review of the MIP images confirms the above findings. IMPRESSION: Cardiomegaly with moderate pulmonary edema and trace pleural effusions. Bibasilar airspace consolidations favored to be atelectasis. No evidence of pulmonary embolism. Emphysema. Electronically Signed   By: Maurine Simmering M.D.   On: 06/13/2022 10:04   DG Chest Port 1 View  Result Date: 06/13/2022 CLINICAL DATA:  RIGHT-sided chest pain. EXAM: PORTABLE CHEST 1 VIEW COMPARISON:  Chest XR and CTA PE, 11/27/2020. FINDINGS: Cardiac silhouette is mildly enlarged. Aortic arch calcifications. Lungs are hypoinflated. Basilar and perihilar-predominant interstitial thickening. No discrete consolidation. Trivial RIGHT pleural effusion. No pneumothorax. No acute displaced fracture. IMPRESSION: 1. Cardiomegaly with mild interstitial pulmonary edema. 2.  Aortic Atherosclerosis (ICD10-I70.0). Electronically Signed   By: Michaelle Birks M.D.   On: 06/13/2022 07:49       Assessment/Plan Principal Problem:   Acute on chronic diastolic CHF (congestive heart failure) (HCC) Active Problems:   NSTEMI (non-ST elevated myocardial infarction) ([place])   Essential hypertension   Type II diabetes mellitus with renal manifestations (HCC)   Iron deficiency anemia   Chronic kidney disease, stage 3b (HCC)   HLD (hyperlipidemia)   Assessment and Plan:  Acute on chronic diastolic CHF (congestive heart failure) ([place]): 2D echo on 11/27/2020 showed EF of 60 to 65% with grade 1 diastolic dysfunction.  Patient has only trace leg edema, but patient has positive JVD, elevated BNP 513, chest x-ray and CTA showed pulmonary edema, consistent with CHF exacerbation.  -Will admit to tele bed as inpatient -Lasix 20 mg bid by IV - 2d echo -Daily weights -strict I/O's -Low salt diet -Fluid restriction -Obtain REDs Vest reading  NSTEMI (non-ST elevated myocardial infarction) [facility]): Troponin level 56--> 452.  Patient had chest tightness, which has resolved. -IV Heparin - Trend Trop - prn Nitroglycerin -  Aspirin, Cresotror - Risk factor stratification: will check FLP and A1C  - f/u 2d echo - consulted Dr. Clayborn Bigness of card  Essential hypertension: -IV hydralazine as needed -Amlodipine, Zestoretic  Type II diabetes mellitus with renal manifestations [facility]): Recent A1c 5.6, well controlled.  Patient taking metformin at home -Sliding scale insulin  Iron deficiency anemia: Hemoglobin 7.9 (7.8 on 06/12/2022).  Patient is following up with Dr. Grayland Ormond for hematology.  Patient received IV Venofer yesterday. -Follow-up with CBC  Chronic kidney disease, stage 3b ([place]): Stable.  Recent baseline creatinine 1.70.  Her creatinine is 1.44, BUN 34, GFR 34 -Follow-up by BMP  HLD (hyperlipidemia) -Crestor    DVT ppx:  on IV heparin  Code Status: DNR per pt and her son  Family Communication:     Yes, patient's son at bed side.    Disposition Plan:  Anticipate discharge back  to previous environment  Consults called: Dr. Clayborn Bigness  Admission status and Level of care: Telemetry Cardiac:     as inpt       Dispo: The patient is from: Home              Anticipated d/c is to: Home              Anticipated d/c date is: 2 days              Patient currently is not medically stable to d/c.    Severity of Illness:  The appropriate patient status for this patient is INPATIENT. Inpatient status is judged to be reasonable and necessary in order to provide the required intensity of service to ensure the patient's safety. The patient's presenting symptoms, physical exam findings, and initial radiographic and laboratory data in the context of their chronic comorbidities is felt to place them at high risk for further clinical deterioration. Furthermore, it is not anticipated that the patient will be medically stable for discharge from the hospital within 2 midnights of admission.   * I certify that at the point of admission it is my clinical judgment that the patient will require inpatient hospital care spanning beyond 2 midnights from the point of admission due to high intensity of service, high risk for further deterioration and high frequency of surveillance required.*       Date of Service 06/13/2022    Ivor Costa [facility]   If 7PM-7AM, please contact night-coverage [email or web address] 06/13/2022, 2:11 PM

## 2022-06-13 NOTE — ED Triage Notes (Addendum)
BIB [facility] from home, lives alone, here for CP, right sided, 8/10, constant, no radiation, tightness, onset 3AM, denies all other sx, h/o anemia, infusion yesterday, NSL R wrist in place, ASA 324 given PTA/EMS. Alert, NAD, calm, interactive. "SPO2 96% RA for EMS", SPO2 on arrival 80% RA. EDP at [place]: pt adds "received flu vaccine, covid vaccine and another vaccine at [facility] on Friday".

## 2022-06-13 NOTE — ED Notes (Signed)
Xray at BS 

## 2022-06-13 NOTE — ED Notes (Signed)
Hospitalist paged re: 56 up to 452

## 2022-06-13 NOTE — ED Notes (Signed)
Unable to print [employer] label, RT notified.

## 2022-06-13 NOTE — ED Notes (Signed)
Pt removed self from monitor and O2 and ambulated to b/r, SPO2 on room air after ambulation was 83%, returned to bed, monitor and O2. Eating at this time. Urine unmeasured. Returned to [employer]. Son at [facility].

## 2022-06-13 NOTE — ED Notes (Signed)
Informed RN bed assigned 

## 2022-06-13 NOTE — Consult Note (Signed)
ANTICOAGULATION CONSULT NOTE  Pharmacy Consult for Heparin Indication: chest pain/ACS  Allergies  Allergen Reactions   Atorvastatin Other (See Comments)   Codeine    Demerol [Meperidine]    Polysaccharide Iron Complex Other (See Comments)    Caused constipation   Naproxen Rash    Caused rash on mouth    Patient Measurements: Height: 5\' 3"  (160 cm) Weight: 59.9 kg (132 lb) IBW/kg (Calculated) : 52.4 Heparin Dosing Weight: 59.9 kg  Vital Signs: Temp: 97.9 F (36.6 C) (11/03 0730) Temp Source: Oral (11/03 0730) BP: 137/46 (11/03 1300) Pulse Rate: 74 (11/03 1300)  Labs: Recent Labs    06/12/22 1321 06/13/22 0720 06/13/22 1215  HGB 7.8* 7.9*  --   HCT 24.9* 25.1*  --   PLT 223 249  --   CREATININE  --  1.44*  --   TROPONINIHS  --  56* 452*    Estimated Creatinine Clearance: 21 mL/min (A) (by C-G formula based on SCr of 1.44 mg/dL (H)).   Medical History: Past Medical History:  Diagnosis Date   CHF (congestive heart failure) (HCC)    Diabetes mellitus without complication (HCC)    GERD (gastroesophageal reflux disease)    Hypertension    Insomnia    Iron deficiency anemia    RLS (restless legs syndrome)     Medications:  No history of chronic anticoagulant use PTA  Assessment: Pharmacy has been consulted to initiate and titrate heparin infusion in 88yo female with PMH of diastolic CHF, HTN, HLD, and T2DM who presented to the ED with chest pain. Troponin levels 56>452. Cardiology consulted for possible catheterization.  Baseline labs: aPTT 34 sec, INR 1.1, Plts 249, Hgb 14.1  Goal of Therapy:  Heparin level 0.3-0.7 units/ml Monitor platelets by anticoagulation protocol: Yes   Plan:  Give 3600 units bolus x 1 Start heparin infusion at 950 units/hr Check anti-Xa level in 8 hours and daily while on heparin Continue to monitor H&H and platelets  Mathilde Mcwherter A Jonee Lamore 06/13/2022,1:53 PM

## 2022-06-14 DIAGNOSIS — I5033 Acute on chronic diastolic (congestive) heart failure: Secondary | ICD-10-CM | POA: Diagnosis not present

## 2022-06-14 LAB — BASIC METABOLIC PANEL
Anion gap: 7 (ref 5–15)
BUN: 31 mg/dL — ABNORMAL HIGH (ref 8–23)
CO2: 28 mmol/L (ref 22–32)
Calcium: 9.4 mg/dL (ref 8.9–10.3)
Chloride: 103 mmol/L (ref 98–111)
Creatinine, Ser: 1.61 mg/dL — ABNORMAL HIGH (ref 0.44–1.00)
GFR, Estimated: 30 mL/min — ABNORMAL LOW (ref >60)
Glucose, Bld: 119 mg/dL — ABNORMAL HIGH (ref 70–99)
Potassium: 4.2 mmol/L (ref 3.5–5.1)
Sodium: 138 mmol/L (ref 135–145)

## 2022-06-14 LAB — GLUCOSE, CAPILLARY
Glucose-Capillary: 122 mg/dL — ABNORMAL HIGH (ref 70–99)
Glucose-Capillary: 206 mg/dL — ABNORMAL HIGH (ref 70–99)
Glucose-Capillary: 125 mg/dL — ABNORMAL HIGH (ref 70–99)
Glucose-Capillary: 187 mg/dL — ABNORMAL HIGH (ref 70–99)

## 2022-06-14 LAB — LIPID PANEL
Cholesterol: 101 mg/dL (ref 0–200)
HDL: 37 mg/dL — ABNORMAL LOW (ref >40)
LDL Cholesterol: 47 mg/dL (ref 0–99)
Total CHOL/HDL Ratio: 2.7 RATIO
Triglycerides: 86 mg/dL (ref <150)
VLDL: 17 mg/dL (ref 0–40)

## 2022-06-14 LAB — CBC
HCT: 22.4 % — ABNORMAL LOW (ref 36.0–46.0)
Hemoglobin: 7.2 g/dL — ABNORMAL LOW (ref 12.0–15.0)
MCH: 30 pg (ref 26.0–34.0)
MCHC: 32.1 g/dL (ref 30.0–36.0)
MCV: 93.3 fL (ref 80.0–100.0)
Platelets: 240 10*3/uL (ref 150–400)
RBC: 2.4 MIL/uL — ABNORMAL LOW (ref 3.87–5.11)
RDW: 12.7 % (ref 11.5–15.5)
WBC: 5.6 10*3/uL (ref 4.0–10.5)
nRBC: 0 % (ref 0.0–0.2)

## 2022-06-14 LAB — TROPONIN I (HIGH SENSITIVITY)
Troponin I (High Sensitivity): 741 ng/L (ref <18)
Troponin I (High Sensitivity): 582 ng/L (ref <18)
Troponin I (High Sensitivity): 633 ng/L (ref <18)
Troponin I (High Sensitivity): 452 ng/L (ref <18)

## 2022-06-14 LAB — MAGNESIUM: Magnesium: 1.7 mg/dL (ref 1.7–2.4)

## 2022-06-14 LAB — HEPARIN LEVEL (UNFRACTIONATED)
Heparin Unfractionated: 0.4 IU/mL (ref 0.30–0.70)
Heparin Unfractionated: <0.1 IU/mL — ABNORMAL LOW (ref 0.30–0.70)

## 2022-06-14 LAB — PREPARE RBC (CROSSMATCH)

## 2022-06-14 MED ORDER — ORAL CARE MOUTH RINSE: 15.0000 mL | OROMUCOSAL | Status: DC | PRN

## 2022-06-14 MED ORDER — CLOPIDOGREL BISULFATE 75 MG PO TABS
75.0000 mg | ORAL_TABLET | Freq: Every day | ORAL | Status: DC
  Administered 2022-06-14 – 2022-06-15 (×2): 75 mg via ORAL
  Filled 2022-06-14 (×2): qty 1

## 2022-06-14 MED ORDER — METOPROLOL TARTRATE 25 MG PO TABS
12.5000 mg | ORAL_TABLET | Freq: Two times a day (BID) | ORAL | Status: DC
  Administered 2022-06-14 – 2022-06-15 (×3): 12.5 mg via ORAL
  Filled 2022-06-14 (×3): qty 1

## 2022-06-14 MED ORDER — SODIUM CHLORIDE 0.9% IV SOLUTION: Freq: Once | INTRAVENOUS | Status: AC

## 2022-06-14 MED ORDER — ISOSORBIDE MONONITRATE ER 30 MG PO TB24
30.0000 mg | ORAL_TABLET | Freq: Every day | ORAL | Status: DC
  Administered 2022-06-14 – 2022-06-15 (×2): 30 mg via ORAL
  Filled 2022-06-14 (×2): qty 1

## 2022-06-14 MED ORDER — FUROSEMIDE 40 MG PO TABS
40.0000 mg | ORAL_TABLET | Freq: Every day | ORAL | Status: DC
  Filled 2022-06-14: qty 1

## 2022-06-14 MED ORDER — FUROSEMIDE 10 MG/ML IJ SOLN
40.0000 mg | Freq: Every day | INTRAMUSCULAR | Status: DC
  Administered 2022-06-15: 40 mg via INTRAVENOUS
  Filled 2022-06-14: qty 4

## 2022-06-14 NOTE — Progress Notes (Signed)
[facility] Cardiology    SUBJECTIVE: Patient seems to be symptom-free no chest pain no significant shortness of breath.  Patient would prefer to be discharged home with outpatient follow-up evaluation of possible.  No cough no shortness of breath no fever chills or sweats   Vitals:   06/14/22 0450 06/14/22 0500 06/14/22 0730 06/14/22 1207  BP: (!) 150/48  (!) 138/53 (!) 130/46  Pulse: 64  68 81  Resp: 18  18 18   Temp: (!) 96.8 F (36 C)  (!) 97.5 F (36.4 C) 97.8 F (36.6 C)  TempSrc:      SpO2: 99%  98% (!) 89%  Weight:  56.2 kg    Height:         Intake/Output Summary (Last 24 hours) at 06/14/2022 1248 Last data filed at 06/13/2022 1845 Gross per 24 hour  Intake --  Output 1700 ml  Net -1700 ml      PHYSICAL EXAM  General: Well developed, well nourished, in no acute distress HEENT:  Normocephalic and atramatic Neck:  No JVD.  Lungs: Clear bilaterally to auscultation and percussion. Heart: HRRR . Normal S1 and S2 without gallops or murmurs.  Abdomen: Bowel sounds are positive, abdomen soft and non-tender  Msk:  Back normal, normal gait. Normal strength and tone for age. Extremities: No clubbing, cyanosis or edema.   Neuro: Alert and oriented X 3. Psych:  Good affect, responds appropriately   LABS: Basic Metabolic Panel: Recent Labs    06/13/22 0720 06/14/22 0615  NA 139 138  K 4.1 4.2  CL 106 103  CO2 23 28  GLUCOSE 158* 119*  BUN 34* 31*  CREATININE 1.44* 1.61*  CALCIUM 9.6 9.4  MG  --  1.7   Liver Function Tests: Recent Labs    06/13/22 0720  AST 21  ALT 14  ALKPHOS 60  BILITOT 0.5  PROT 7.2  ALBUMIN 3.7   No results for input(s): "LIPASE", "AMYLASE" in the last 72 hours. CBC: Recent Labs    06/12/22 1321 06/13/22 0720 06/14/22 0615  WBC 6.2 6.5 5.6  NEUTROABS 4.8 4.8  --   HGB 7.8* 7.9* 7.2*  HCT 24.9* 25.1* 22.4*  MCV 96.9 94.0 93.3  PLT 223 249 240   Cardiac Enzymes: No results for input(s): "CKTOTAL", "CKMB", "CKMBINDEX",  "TROPONINI" in the last 72 hours. BNP: Invalid input(s): "POCBNP" D-Dimer: No results for input(s): "DDIMER" in the last 72 hours. Hemoglobin A1C: No results for input(s): "HGBA1C" in the last 72 hours. Fasting Lipid Panel: Recent Labs    06/14/22 0615  CHOL 101  HDL 37*  LDLCALC 47  TRIG 86  CHOLHDL 2.7   Thyroid Function Tests: No results for input(s): "TSH", "T4TOTAL", "T3FREE", "THYROIDAB" in the last 72 hours.  Invalid input(s): "FREET3" Anemia Panel: Recent Labs    06/12/22 1321  VITAMINB12 213  FERRITIN 12  TIBC 361  IRON 25*    CT Angio Chest PE W and/or Wo Contrast  Result Date: 06/13/2022 CLINICAL DATA:  Pulmonary embolism (PE) suspected, high prob EXAM: CT ANGIOGRAPHY CHEST WITH CONTRAST TECHNIQUE: Multidetector CT imaging of the chest was performed using the standard protocol during bolus administration of intravenous contrast. Multiplanar CT image reconstructions and MIPs were obtained to evaluate the vascular anatomy. RADIATION DOSE REDUCTION: This exam was performed according to the departmental dose-optimization program which includes automated exposure control, adjustment of the mA and/or kV according to patient size and/or use of iterative reconstruction technique. CONTRAST:  82mL OMNIPAQUE IOHEXOL 350 MG/ML SOLN  COMPARISON:  CT a chest 11/27/2020. FINDINGS: Cardiovascular: Satisfactory opacification of the pulmonary arteries to the segmental level. No evidence of pulmonary embolism. Mild cardiomegaly.No pericardial disease. Coronary artery atherosclerosis. Moderate atherosclerosis of the thoracic aorta. Mediastinum/Nodes: Mildly prominent mediastinal lymph nodes, likely reactive. The thyroid is unremarkable. Esophagus is unremarkable. Lungs/Pleura: Moderate centrilobular and paraseptal emphysema. Mild diffuse bronchial wall thickening, lower lung predominant. There is diffuse interlobular septal thickening and ground-glass opacities bilaterally. There are more  confluent airspace consolidations in the lung bases, likely atelectasis. There are trace pleural effusions, right greater than left. No evidence of pneumothorax. Upper Abdomen: No acute findings. Calcified granulomas in the spleen. Musculoskeletal: Unchanged mild chronic T2 superior endplate compression deformity. No acute osseous abnormality. No suspicious osseous lesion. Review of the MIP images confirms the above findings. IMPRESSION: Cardiomegaly with moderate pulmonary edema and trace pleural effusions. Bibasilar airspace consolidations favored to be atelectasis. No evidence of pulmonary embolism. Emphysema. Electronically Signed   By: Maurine Simmering M.D.   On: 06/13/2022 10:04   DG Chest Port 1 View  Result Date: 06/13/2022 CLINICAL DATA:  RIGHT-sided chest pain. EXAM: PORTABLE CHEST 1 VIEW COMPARISON:  Chest XR and CTA PE, 11/27/2020. FINDINGS: Cardiac silhouette is mildly enlarged. Aortic arch calcifications. Lungs are hypoinflated. Basilar and perihilar-predominant interstitial thickening. No discrete consolidation. Trivial RIGHT pleural effusion. No pneumothorax. No acute displaced fracture. IMPRESSION: 1. Cardiomegaly with mild interstitial pulmonary edema. 2.  Aortic Atherosclerosis (ICD10-I70.0). Electronically Signed   By: Michaelle Birks M.D.   On: 06/13/2022 07:49     Echo pending  TELEMETRY: Normal sinus rhythm rate of 70 nonspecific ST-T wave changes:  ASSESSMENT AND PLAN:  Principal Problem:   Acute on chronic diastolic CHF (congestive heart failure) (HCC) Active Problems:   Iron deficiency anemia   Essential hypertension   NSTEMI (non-ST elevated myocardial infarction) ([place])   HLD (hyperlipidemia)   Type II diabetes mellitus with renal manifestations (HCC)   Chronic kidney disease, stage 3b (HCC)    Plan Elevated troponin/non-STEMI versus demand ischemia we will consider discontinuing heparin at this stage continue aspirin Plavix for now patient pain-free Consider functional  study versus cardiac catheter for further assessment Correct anemia with transfusion to hemoglobin around 9 Agree with diuresis we will transition from IV Lasix to p.o. Evaluate hypoxemia but discontinued oxygen to see what her sats are on room air.  Hypoxemia probably related to bowel failure continue diuresis Recommend adding Imdur beta-blocker in addition to her amlodipine for possible anginal symptoms Hypertension management with amlodipine lisinopril and metoprolol and Imdur for now Statin therapy for hyperlipidemia Increase activity and if symptom-free we will consider discharge home and follow-up early in office for outpatient ischemia work-up and evaluation and heart failure management  Yolonda Kida, MD, 06/14/2022 12:48 PM

## 2022-06-14 NOTE — Evaluation (Addendum)
Occupational Therapy Evaluation Patient Details Name: Lisa Mcneil MRN: [id number] DOB: [birth year] Today's Date: 06/14/2022   History of Present Illness Pt is a 88 year old female presenting to the ED with chest tightness, admitted with NSTEMI, Acute on chronic diastolic CHF; PMH significant for diastolic CHF, hypertension, hyperlipidemia, diabetes mellitus, GERD, iron deficiency anemia, CKD-3B   Clinical Impression   Chart reviewed, MD ok'd pt to be seen by therapy. RN also ok'd pt to be seen by therapy during transfusion (after initial 15 minutes), present during initial mobility. Pt is alert and oriented x4, intermittent vcs required for pacing. PTA pt is MOD I-I in ADL/IADL, works 5 days a week 3 hrs a day, amb with no AD. Pt presents with deficits in activity tolerance and balance on this date affecting safe and optimal ADL completion. Increased spo2 needs with mobility, pt down to 86% on RA, >90% on 2 L via [place]. At this time recommend HHOT following discharge to address functional deficits pending progress. Pt is left in bedside chair, all needs met. RN aware of pt status.      Recommendations for follow up therapy are one component of a multi-disciplinary discharge planning process, led by the attending physician.  Recommendations may be updated based on patient status, additional functional criteria and insurance authorization.   Follow Up Recommendations  Home health OT    Assistance Recommended at Discharge Intermittent Supervision/Assistance  Patient can return home with the following A little help with bathing/dressing/bathroom    Functional Status Assessment  Patient has had a recent decline in their functional status and demonstrates the ability to make significant improvements in function in a reasonable and predictable amount of time.  Equipment Recommendations  None recommended by OT    Recommendations for Other Services       Precautions / Restrictions  Precautions Precautions: Fall Restrictions Weight Bearing Restrictions: No      Mobility Bed Mobility Overal bed mobility: Needs Assistance Bed Mobility: Supine to Sit     Supine to sit: Modified independent (Device/Increase time)          Transfers Overall transfer level: Needs assistance Equipment used: None Transfers: Sit to/from Stand Sit to Stand: Supervision                  Balance Overall balance assessment: Needs assistance Sitting-balance support: Feet supported Sitting balance-Leahy Scale: Good     Standing balance support: No upper extremity supported Standing balance-Leahy Scale: Fair                             ADL either performed or assessed with clinical judgement   ADL Overall ADL's : Needs assistance/impaired Eating/Feeding: Set up;Sitting   Grooming: Wash/dry hands;Sitting;Set up               Lower Body Dressing: Set up   Toilet Transfer: Min guard;Ambulation;BSC/3in1;Cueing for safety;+2 for safety/equipment   Toileting- Clothing Manipulation and Hygiene: Supervision/safety;Sitting/lateral lean       Functional mobility during ADLs: Min guard (household distances +2 for safety/lines/leads)       Vision Patient Visual Report: No change from baseline       Perception     Praxis      Pertinent Vitals/Pain Pain Assessment Pain Assessment: No/denies pain     Hand Dominance Right   Extremity/Trunk Assessment Upper Extremity Assessment Upper Extremity Assessment: Overall WFL for tasks assessed   Lower Extremity Assessment Lower Extremity  Assessment: Overall WFL for tasks assessed   Cervical / Trunk Assessment Cervical / Trunk Assessment: Normal   Communication Communication Communication: No difficulties   Cognition Arousal/Alertness: Awake/alert Behavior During Therapy: WFL for tasks assessed/performed Overall Cognitive Status: Within Functional Limits for tasks assessed                                  General Comments: mildly impulsive, responsive to vcs for pacing, pt reports she is used to "doing things at her own pace"     General Comments  spo2 down to 86% on RA with mobility, >90% on 2 L via [place]    Exercises     Shoulder Instructions      Home Living Family/patient expects to be discharged to:: Private residence Living Arrangements: Alone Available Help at Discharge: Family;Available 24 hours/day Type of Home: Apartment Home Access: Stairs to enter Entrance Stairs-Number of Steps: 5 Entrance Stairs-Rails: Left Home Layout: One level     Bathroom Shower/Tub: Occupational psychologist: Standard     Home Equipment: Grab bars - tub/shower          Prior Functioning/Environment Prior Level of Function : Independent/Modified Independent             Mobility Comments: works part time, drives, community level particiaption without AD. 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[employer]; cooks, Microbiologist, drives, grocery shops        OT Problem List: Decreased activity tolerance;Decreased knowledge of use of DME or AE;Decreased safety awareness      OT Treatment/Interventions: Self-care/ADL training;Therapeutic exercise;Patient/family education;DME and/or AE instruction;Therapeutic activities;Energy conservation    OT Goals(Current goals can be found in the care plan section) Acute Rehab OT Goals Patient Stated Goal: go home, return to PLOF OT Goal Formulation: With patient Time For Goal Achievement: 06/28/22 Potential to Achieve Goals: Good ADL Goals Pt Will Perform Grooming: with modified independence;standing Pt Will Perform Lower Body Dressing: with modified independence Pt Will Transfer to Toilet: with modified independence Pt Will Perform Toileting - Clothing Manipulation and hygiene: with modified independence  OT Frequency: Min 2X/week    Co-evaluation PT/OT/SLP  Co-Evaluation/Treatment: Yes Reason for Co-Treatment: For patient/therapist safety;Complexity of the patient's impairments (multi-system involvement) PT goals addressed during session: Mobility/safety with mobility;Balance;Proper use of DME OT goals addressed during session: ADL's and self-care      AM-PAC OT "6 Clicks" Daily Activity     Outcome Measure Help from another person eating meals?: None Help from another person taking care of personal grooming?: None Help from another person toileting, which includes using toliet, bedpan, or urinal?: None Help from another person bathing (including washing, rinsing, drying)?: A Little Help from another person to put on and taking off regular upper body clothing?: None Help from another person to put on and taking off regular lower body clothing?: None 6 Click Score: 23   End of Session Equipment Utilized During Treatment: Gait belt Nurse Communication: Mobility status  Activity Tolerance: Patient tolerated treatment well Patient left: in chair;with call bell/phone within reach;with chair alarm set  OT Visit Diagnosis: Unsteadiness on feet (R26.81)                Time: 7371-0626 OT Time Calculation (min): 36 min Charges:  OT General Charges $OT Visit: 1 Visit OT Evaluation $OT Eval Moderate Complexity: 1 Mod  Shanon Payor, OTD OTR/L  06/14/22, 3:46 PM

## 2022-06-14 NOTE — Assessment & Plan Note (Signed)
Stable.  Recent baseline creatinine 1.70.  Her creatinine is 1.44, BUN 34, GFR 34 -Follow-up by BMP -Avoid nephrotoxins

## 2022-06-14 NOTE — Assessment & Plan Note (Signed)
Troponin above 700, which can be due to elevated remaining ischemia or NSTEMI.  Pending echocardiogram to rule out any wall motion abnormalities. Elevated BNP.  Cardiology would like to do stress testing as an outpatient. Heparin was discontinued by cardiology. -Continue with aspirin and statin -Continue to monitor

## 2022-06-14 NOTE — Assessment & Plan Note (Signed)
Continue Crestor 

## 2022-06-14 NOTE — Assessment & Plan Note (Signed)
-  Continue amlodipine and Zestoretic  -IV hydralazine as needed

## 2022-06-14 NOTE — Assessment & Plan Note (Signed)
Hemoglobin 7.9 (7.8 on 06/12/2022).  Patient is following up with Dr. Grayland Ormond for hematology.  Patient received IV Venofer yesterday.  Hemoglobin at 7.2 today. -1 unit of PRBC to keep hemoglobin above 8 for concern of NSTEMI -Continue to monitor -Transfuse if below 8

## 2022-06-14 NOTE — Assessment & Plan Note (Signed)
5.6, well controlled.  Patient taking metformin at home -Sliding scale insulin

## 2022-06-14 NOTE — Evaluation (Signed)
Physical Therapy Evaluation Patient Details Name: Lisa Mcneil MRN: [id number] DOB: Apr 24, 1928 Today's Date: 06/14/2022  History of Present Illness  Pt is a 88 year old female presenting to the ED with chest tightness, admitted with NSTEMI, Acute on chronic diastolic CHF; PMH significant for diastolic CHF, hypertension, hyperlipidemia, diabetes mellitus, GERD, iron deficiency anemia, CKD-3B  Clinical Impression  Pt received sitting on EOB with OT evaluation agreeable to participate in PT/OT co-evaluation. Pt's PLOF is Working part time and ambulatory with AD. PT assessment revealed mild unsteady gait and SPO2<90% with exertion at RA. Pt performs Bed mobility, transfers with Sup and is able to ambulate short distance without AD with LOB corrected by Pt and PT. Pt SPO2 86% at RA and with exertion. Pt able to maintain >90% with 2L O2 vai Lisa Mcneil. Pt demonstrates mild impulsivity and therefore PT educated pt about fall risk and RPE scale to promote safe functional mobility. Pt demonstrated good understanding.  PT will continue in acute. Pt will benefit from HHPT and O2 supplement.      Recommendations for follow up therapy are one component of a multi-disciplinary discharge planning process, led by the attending physician.  Recommendations may be updated based on patient status, additional functional criteria and insurance authorization.  Follow Up Recommendations Home health PT (O2 supplement)      Assistance Recommended at Discharge Intermittent Supervision/Assistance  Patient can return home with the following  A little help with walking and/or transfers;A little help with bathing/dressing/bathroom;Assistance with cooking/housework;Assist for transportation;Help with stairs or ramp for entrance    Equipment Recommendations Cane  Recommendations for Other Services       Functional Status Assessment Patient has had a recent decline in their functional status and demonstrates the ability to  make significant improvements in function in a reasonable and predictable amount of time.     Precautions / Restrictions Precautions Precautions: Fall Restrictions Weight Bearing Restrictions: No      Mobility  Bed Mobility Overal bed mobility: Needs Assistance             General bed mobility comments: received sitting on EOB    Transfers Overall transfer level: Needs assistance Equipment used: None Transfers: Sit to/from Stand, Bed to chair/wheelchair/BSC Sit to Stand: Supervision   Step pivot transfers: Supervision       General transfer comment: impulsive    Ambulation/Gait Ambulation/Gait assistance: Min guard Gait Distance (Feet): 37 Feet Assistive device: None Gait Pattern/deviations: Step-through pattern Gait velocity: dec     General Gait Details: LOB/sways to R side  with O2 <88% at RA  Stairs: Unsafe            Wheelchair Mobility    Modified Rankin (Stroke Patients Only)       Balance Overall balance assessment: Needs assistance Sitting-balance support: Feet supported Sitting balance-Leahy Scale: Good     Standing balance support: No upper extremity supported Standing balance-Leahy Scale: Fair Standing balance comment: LOB to R corrected byPT and pt.                             Pertinent Vitals/Pain Pain Assessment Pain Assessment: No/denies pain    Home Living Family/patient expects to be discharged to:: Private residence Living Arrangements: Alone Available Help at Discharge: Family;Available 24 hours/day Type of Home: Apartment Home Access: Stairs to enter Entrance Stairs-Rails: Left Entrance Stairs-Number of Steps: 5   Home Layout: One level Home Equipment: Grab bars - tub/shower  Prior Function Prior Level of Function : Independent/Modified Independent             Mobility Comments: works part time, drives, community level particiaption without AD. 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[employer]; cooks, Microbiologist, drives, grocery shops     Hand Dominance   Dominant Hand: Right    Extremity/Trunk Assessment   Upper Extremity Assessment Upper Extremity Assessment: Overall WFL for tasks assessed    Lower Extremity Assessment Lower Extremity Assessment: Overall WFL for tasks assessed    Cervical / Trunk Assessment Cervical / Trunk Assessment: Normal  Communication   Communication: No difficulties  Cognition Arousal/Alertness: Awake/alert Behavior During Therapy: WFL for tasks assessed/performed Overall Cognitive Status: Within Functional Limits for tasks assessed                                 General Comments: mildly impulsive, responsive to vcs for pacing, pt reports she is used to "doing things at her own pace"        General Comments General comments (skin integrity, edema, etc.): spo2 down to 86% on RA with mobility, >90% on 2 L via Lisa Mcneil    Exercises     Assessment/Plan    PT Assessment Patient needs continued PT services  PT Problem List Decreased balance       PT Treatment Interventions Gait training;Stair training    PT Goals (Current goals can be found in the Care Plan section)  Acute Rehab PT Goals Patient Stated Goal: " I want to go home" PT Goal Formulation: With patient Time For Goal Achievement: 06/28/22 Potential to Achieve Goals: Good    Frequency Min 2X/week     Co-evaluation PT/OT/SLP Co-Evaluation/Treatment: Yes Reason for Co-Treatment: For patient/therapist safety;Complexity of the patient's impairments (multi-system involvement) PT goals addressed during session: Mobility/safety with mobility;Balance;Proper use of DME OT goals addressed during session: ADL's and self-care       AM-PAC PT "6 Clicks" Mobility  Outcome Measure Help needed turning from your back to your side while in a flat bed without using bedrails?: A Little Help needed moving from lying on  your back to sitting on the side of a flat bed without using bedrails?: A Little Help needed moving to and from a bed to a chair (including a wheelchair)?: A Little Help needed standing up from a chair using your arms (e.g., wheelchair or bedside chair)?: A Little Help needed to walk in hospital room?: A Little Help needed climbing 3-5 steps with a railing? : A Lot 6 Click Score: 17    End of Session         PT Visit Diagnosis: Unsteadiness on feet (R26.81)    Time: 9702-6378 PT Time Calculation (min) (ACUTE ONLY): 20 min   Charges:   PT Evaluation $PT Eval Moderate Complexity: 1 Mod PT Treatments $Gait Training: 8-22 mins        Kimbery Harwood PT DPT 3:48 PM,06/14/22

## 2022-06-14 NOTE — Assessment & Plan Note (Signed)
echo on 11/27/2020 showed EF of 60 to 65% with grade 1 diastolic dysfunction.  Patient has only trace leg edema, but patient has positive JVD, elevated BNP 513, chest x-ray and CTA showed pulmonary edema, consistent with CHF exacerbation.  Patient received IV Lasix. Continued to desaturate with minor exertion-no baseline oxygen use. Echocardiogram pending. Cardiology cleared her for discharge as she wants to go. -Continue with Lasix -Follow-up echocardiogram -Daily weight and BMP -Strict intake and output -Supplemental oxygen-wean as tolerated

## 2022-06-14 NOTE — Progress Notes (Signed)
Progress Note   [name] JSE:[id number] DOB: 11/26/1927 DOA: 06/13/2022     1 DOS: the patient was seen and examined on 06/14/2022   Brief hospital course: Taken from H&P .  Lisa Mcneil is a 88 y.o. female with medical history significant of diastolic CHF, hypertension, hyperlipidemia, diabetes mellitus, GERD, iron deficiency anemia, CKD-3B, who presents with chest tightness started in the morning of admission, resolved at the time of interview.  Found to be hypoxic to 80% on room air leading up to 2 L of oxygen.  No baseline oxygen use.  Data reviewed independently and ED Course: pt was found to have BNP 513, troponin level 56, negative COVID PCR, stable renal function, temperature normal, blood pressure 152/50, heart rate 88, RR 27, 15.  Chest x-ray showed cardiomegaly and interstitial pulm edema.  CTA negative for PE, but showed cardiomegaly and pulmonary edema and possible bilateral basilar atelectasis.  EKG: I have personally reviewed.  Sinus rhythm, QTc 435, left atrial enlargement, ST depression in V5-V6.   11/4: Troponin peaked at 741 and started trending down.  Echocardiogram pending. Hemoglobin at 7.20, history of iron deficiency anemia and she cares iron infusions from hematology.  Ordered 1 unit of PRBC to keep the hemoglobin above 8 with concern for ACS. Patient would like to go home and cardiology cleared her for discharge after getting 1 unit of PRBC and they will follow-up as an outpatient.  Although patient does not want to stay but she was desaturating in the low 80s with minor exertion, having some basal crackles and lower extremity edema.  Need to get blood transfusion.  Discussed with daughter again as if she have to leave she might have to leave AMA, family decided to keep her in the hospital for another night and reassess tomorrow.  Patient did not had any chest pain or shortness of breath.  Discussed with DIL on phone. She will continue on her  current medications and have a close follow-up with her cardiologist for further recommendations.  Assessment and Plan: * Acute on chronic diastolic CHF (congestive heart failure) (HCC) echo on 11/27/2020 showed EF of 60 to 65% with grade 1 diastolic dysfunction.  Patient has only trace leg edema, but patient has positive JVD, elevated BNP 513, chest x-ray and CTA showed pulmonary edema, consistent with CHF exacerbation.  Patient received IV Lasix. Continued to desaturate with minor exertion-no baseline oxygen use. Echocardiogram pending. Cardiology cleared her for discharge as she wants to go. -Continue with Lasix -Follow-up echocardiogram -Daily weight and BMP -Strict intake and output -Supplemental oxygen-wean as tolerated  NSTEMI (non-ST elevated myocardial infarction) (HCC) Troponin above 700, which can be due to elevated remaining ischemia or NSTEMI.  Pending echocardiogram to rule out any wall motion abnormalities. Elevated BNP.  Cardiology would like to do stress testing as an outpatient. Heparin was discontinued by cardiology. -Continue with aspirin and statin -Continue to monitor   Essential hypertension -Continue amlodipine and Zestoretic  -IV hydralazine as needed    Type II diabetes mellitus with renal manifestations (HCC) 5.6, well controlled.  Patient taking metformin at home -Sliding scale insulin  Iron deficiency anemia  Hemoglobin 7.9 (7.8 on 06/12/2022).  Patient is following up with Dr. Grayland Ormond for hematology.  Patient received IV Venofer yesterday.  Hemoglobin at 7.2 today. -1 unit of PRBC to keep hemoglobin above 8 for concern of NSTEMI -Continue to monitor -Transfuse if below 8  Chronic kidney disease, stage 3b (HCC) Stable.  Recent baseline creatinine 1.70.  Her creatinine is 1.44, BUN 34, GFR 34 -Follow-up by BMP -Avoid nephrotoxins  HLD (hyperlipidemia) -Continue Crestor   Subjective: Patient was seen and examined today.  Denies any chest pain  or shortness of breath.  She wants to go home.  Physical Exam: Vitals:   06/14/22 0730 06/14/22 1207 06/14/22 1402 06/14/22 1424  BP: (!) 138/53 (!) 130/46 (!) 134/47 (!) 145/47  Pulse: 68 81 74 78  Resp: 18 18 16 16   Temp: (!) 97.5 F (36.4 C) 97.8 F (36.6 C) 98.1 F (36.7 C) 97.8 F (36.6 C)  TempSrc:   Oral Oral  SpO2: 98% (!) 89% 91% 92%  Weight:      Height:       General.  Frail elderly lady, in no acute distress. Pulmonary.  Few basal crackles bilaterally, normal respiratory effort. CV.  Regular rate and rhythm, no JVD, rub or murmur. Abdomen.  Soft, nontender, nondistended, BS positive. CNS.  Alert and oriented .  No focal neurologic deficit. Extremities.  1+ LE edema, no cyanosis, pulses intact and symmetrical. Psychiatry.  Judgment and insight appears normal.   Data Reviewed: Prior data reviewed  Family Communication: Discussed with son and daughter-in-law on phone.  Disposition: Status is: Inpatient Remains inpatient appropriate because: Severity of illness  Planned Discharge Destination: Home  Time spent: 50 minutes  This record has been created using Systems analyst. Errors have been sought and corrected,but may not always be located. Such creation errors do not reflect on the standard of care.   Author: Lorella Nimrod, MD 06/14/2022 2:50 PM  For on call review [email or web address].

## 2022-06-14 NOTE — Hospital Course (Addendum)
Taken from H&P .  Lisa Mcneil is a 88 y.o. female with medical history significant of diastolic CHF, hypertension, hyperlipidemia, diabetes mellitus, GERD, iron deficiency anemia, CKD-3B, who presents with chest tightness started in the morning of admission, resolved at the time of interview.  Found to be hypoxic to 80% on room air leading up to 2 L of oxygen.  No baseline oxygen use.  Data reviewed independently and ED Course: pt was found to have BNP 513, troponin level 56, negative COVID PCR, stable renal function, temperature normal, blood pressure 152/50, heart rate 88, RR 27, 15.  Chest x-ray showed cardiomegaly and interstitial pulm edema.  CTA negative for PE, but showed cardiomegaly and pulmonary edema and possible bilateral basilar atelectasis.  EKG: I have personally reviewed.  Sinus rhythm, QTc 435, left atrial enlargement, ST depression in V5-V6.   11/4: Troponin peaked at 741 and started trending down.  Echocardiogram pending. Hemoglobin at 7.20, history of iron deficiency anemia and she cares iron infusions from hematology.  Ordered 1 unit of PRBC to keep the hemoglobin above 8 with concern for ACS. Patient would like to go home and cardiology cleared her for discharge after getting 1 unit of PRBC and they will follow-up as an outpatient.  Although patient does not want to stay but she was desaturating in the low 80s with minor exertion, having some basal crackles and lower extremity edema.  Need to get blood transfusion.  Discussed with daughter again as if she have to leave she might have to leave AMA, family decided to keep her in the hospital for another night and reassess tomorrow.  Patient did not had any chest pain or shortness of breath.  Discussed with DIL on phone. She will continue on her current medications and have a close follow-up with her cardiologist for further recommendations.  11/5: No chest pain or shortness of breath.  Able to weaned off to room air, able  to ambulate without any difficulty or desaturation.  Hemoglobin improved to 9.8 after getting 1 unit of PRBC.  Worsening of renal function with BUN of 36 and creatinine of 2.31 after getting IV Lasix which is being held now.  Patient should be encouraged to have p.o. hydration. He was also asked to hold her home Lasix and Zestoretic for next few days until she sees her doctor and have her renal functions rechecked.  Patient wants to go home.  Discussed with DIL on phone. She is being discharged on few new medications which include Imdur, aspirin, Plavix and low-dose metoprolol.  Might not even need Zestoretic anymore. She was instructed to hold home Lasix and Zestoretic. She will need her kidney functions checked by Tuesday.  She will continue with the rest of her home medications with the changes mentioned above and need to have a close follow-up with her providers for further recommendations.

## 2022-06-14 NOTE — Progress Notes (Signed)
PT Cancellation Note  Patient Details Name: Lisa Mcneil MRN: [id number] DOB: 1927-09-18   Cancelled Treatment:     PT evalution and treatmetn not completed 2/2 pt's troponin level high and as per MD to hold the pt. PT will attempt later if pt is appropriate and cleared by the MD  Joaquin Music PT DPT 10:22 AM,06/14/22

## 2022-06-14 NOTE — Consult Note (Signed)
ANTICOAGULATION CONSULT NOTE  Pharmacy Consult for Heparin Indication: chest pain/ACS  Allergies  Allergen Reactions   Atorvastatin Other (See Comments)   Codeine    Demerol [Meperidine]    Polysaccharide Iron Complex Other (See Comments)    Caused constipation   Naproxen Rash    Caused rash on mouth    Patient Measurements: Height: 5\' 3"  (160 cm) Weight: 56.2 kg (123 lb 14.4 oz) IBW/kg (Calculated) : 52.4 Heparin Dosing Weight: 59.9 kg  Vital Signs: Temp: 97.5 F (36.4 C) (11/04 0730) BP: 138/53 (11/04 0730) Pulse Rate: 68 (11/04 0730)  Labs: Recent Labs    06/12/22 1321 06/13/22 0720 06/13/22 1215 06/13/22 1400 06/13/22 2226 06/14/22 0615  HGB 7.8* 7.9*  --   --   --  7.2*  HCT 24.9* 25.1*  --   --   --  22.4*  PLT 223 249  --   --   --  240  APTT  --  34  --   --   --   --   LABPROT  --  14.1  --   --   --   --   INR  --  1.1  --   --   --   --   HEPARINUNFRC  --   --   --   --  0.16* 0.40  CREATININE  --  1.44*  --   --   --  1.61*  TROPONINIHS  --  56* 452* 648*  --   --      Estimated Creatinine Clearance: 18.8 mL/min (A) (by C-G formula based on SCr of 1.61 mg/dL (H)).   Medical History: Past Medical History:  Diagnosis Date   CHF (congestive heart failure) (HCC)    Diabetes mellitus without complication (HCC)    GERD (gastroesophageal reflux disease)    Hypertension    Insomnia    Iron deficiency anemia    RLS (restless legs syndrome)     Medications:  No history of chronic anticoagulant use PTA  Assessment: Pharmacy has been consulted to initiate and titrate heparin infusion in 88yo female with PMH of diastolic CHF, HTN, HLD, and T2DM who presented to the ED with chest pain. Troponin levels 56>452. Cardiology consulted for possible catheterization.  Baseline labs: aPTT 34 sec, INR 1.1, Plts 249, Hgb 14.1  Goal of Therapy:  Heparin level 0.3-0.7 units/ml Monitor platelets by anticoagulation protocol: Yes    Plan: heparin level  therapeutic ---continue heparin infusion at 900 units/hr ---recheck heparin level in 8 hours to confirm ---Continue to monitor H&H and platelets  Vallery Sa, PharmD, BCPS 06/14/2022 7:45 AM

## 2022-06-15 ENCOUNTER — Inpatient Hospital Stay
Admit: 2022-06-15 | Discharge: 2022-06-15 | Disposition: A | Discharge status: Home / Self Care | Payer: Medicare HMO | Attending: Internal Medicine | Admitting: Internal Medicine

## 2022-06-15 DIAGNOSIS — I5033 Acute on chronic diastolic (congestive) heart failure: Secondary | ICD-10-CM | POA: Diagnosis not present

## 2022-06-15 LAB — ECHOCARDIOGRAM COMPLETE
AR max vel: 0.78 cm2
AV Area VTI: 0.89 cm2
AV Area mean vel: 0.8 cm2
AV Mean grad: 27 mmHg
AV Peak grad: 42.9 mmHg
Ao pk vel: 3.28 m/s
Area-P 1/2: 2.95 cm2
Calc EF: 64 %
Height: 63 in
S' Lateral: 2.7 cm
Single Plane A2C EF: 65.5 %
Single Plane A4C EF: 66.1 %
Weight: 1982.38 oz

## 2022-06-15 LAB — CBC
HCT: 29.7 % — ABNORMAL LOW (ref 36.0–46.0)
Hemoglobin: 9.8 g/dL — ABNORMAL LOW (ref 12.0–15.0)
MCH: 29.8 pg (ref 26.0–34.0)
MCHC: 33 g/dL (ref 30.0–36.0)
MCV: 90.3 fL (ref 80.0–100.0)
Platelets: 255 10*3/uL (ref 150–400)
RBC: 3.29 MIL/uL — ABNORMAL LOW (ref 3.87–5.11)
RDW: 13.8 % (ref 11.5–15.5)
WBC: 5.9 10*3/uL (ref 4.0–10.5)
nRBC: 0 % (ref 0.0–0.2)

## 2022-06-15 LAB — BASIC METABOLIC PANEL
Anion gap: 10 (ref 5–15)
BUN: 36 mg/dL — ABNORMAL HIGH (ref 8–23)
CO2: 26 mmol/L (ref 22–32)
Calcium: 9.3 mg/dL (ref 8.9–10.3)
Chloride: 103 mmol/L (ref 98–111)
Creatinine, Ser: 2.31 mg/dL — ABNORMAL HIGH (ref 0.44–1.00)
GFR, Estimated: 19 mL/min — ABNORMAL LOW (ref >60)
Glucose, Bld: 142 mg/dL — ABNORMAL HIGH (ref 70–99)
Potassium: 4.1 mmol/L (ref 3.5–5.1)
Sodium: 139 mmol/L (ref 135–145)

## 2022-06-15 LAB — TYPE AND SCREEN
ABO/RH(D): A POS
Antibody Screen: NEGATIVE
Unit division: 0

## 2022-06-15 LAB — BPAM RBC
Blood Product Expiration Date: 202311302359
ISSUE DATE / TIME: 202311041352
Unit Type and Rh: 6200

## 2022-06-15 LAB — GLUCOSE, CAPILLARY
Glucose-Capillary: 132 mg/dL — ABNORMAL HIGH (ref 70–99)
Glucose-Capillary: 241 mg/dL — ABNORMAL HIGH (ref 70–99)

## 2022-06-15 MED ORDER — FUROSEMIDE 20 MG PO TABS: 20.0000 mg | ORAL_TABLET | Freq: Every day | ORAL | 11 refills | Status: DC | PRN

## 2022-06-15 MED ORDER — ASPIRIN 81 MG PO TBEC: 81.0000 mg | DELAYED_RELEASE_TABLET | Freq: Every day | ORAL | 12 refills | Status: AC

## 2022-06-15 MED ORDER — CLOPIDOGREL BISULFATE 75 MG PO TABS: 75.0000 mg | ORAL_TABLET | Freq: Every day | ORAL | 1 refills | Status: DC

## 2022-06-15 MED ORDER — LISINOPRIL-HYDROCHLOROTHIAZIDE 20-12.5 MG PO TABS: 1.0000 | ORAL_TABLET | Freq: Every day | ORAL | Status: DC

## 2022-06-15 MED ORDER — METOPROLOL TARTRATE 25 MG PO TABS: 12.5000 mg | ORAL_TABLET | Freq: Two times a day (BID) | ORAL | 1 refills | Status: DC

## 2022-06-15 MED ORDER — ISOSORBIDE MONONITRATE ER 30 MG PO TB24: 30.0000 mg | ORAL_TABLET | Freq: Every day | ORAL | 1 refills | Status: DC

## 2022-06-15 MED ORDER — NITROGLYCERIN 0.4 MG SL SUBL: 0.4000 mg | SUBLINGUAL_TABLET | SUBLINGUAL | 12 refills | Status: DC | PRN

## 2022-06-15 NOTE — Discharge Summary (Signed)
Physician Discharge Summary   [name] MRN: [id number] DOB: November 24, 1927  Admit date:     06/13/2022  Discharge date: 06/15/22  Discharge Physician: Lisa Mcneil   PCP: Lisa Body, MD   Recommendations at discharge:  Please obtain CBC and BMP by Tuesday-in 2 days Holding home Lasix and Zestoretic due to worsening of creatinine with IV Lasix. Follow-up with primary care provider within a week Follow-up with cardiology within a week  Discharge Diagnoses: Principal Problem:   Acute on chronic diastolic CHF (congestive heart failure) ([place]) Active Problems:   NSTEMI (non-ST elevated myocardial infarction) ([place])   Essential hypertension   Type II diabetes mellitus with renal manifestations (HCC)   Iron deficiency anemia   Chronic kidney disease, stage 3b (HCC)   HLD (hyperlipidemia)   Hospital Course: Taken from H&P .  Lisa Mcneil is a 88 y.o. female with medical history significant of diastolic CHF, hypertension, hyperlipidemia, diabetes mellitus, GERD, iron deficiency anemia, CKD-3B, who presents with chest tightness started in the morning of admission, resolved at the time of interview.  Found to be hypoxic to 80% on room air leading up to 2 L of oxygen.  No baseline oxygen use.  Data reviewed independently and ED Course: pt was found to have BNP 513, troponin level 56, negative COVID PCR, stable renal function, temperature normal, blood pressure 152/50, heart rate 88, RR 27, 15.  Chest x-ray showed cardiomegaly and interstitial pulm edema.  CTA negative for PE, but showed cardiomegaly and pulmonary edema and possible bilateral basilar atelectasis.  EKG: I have personally reviewed.  Sinus rhythm, QTc 435, left atrial enlargement, ST depression in V5-V6.   11/4: Troponin peaked at 741 and started trending down.  Echocardiogram pending. Hemoglobin at 7.20, history of iron deficiency anemia and she cares iron infusions from hematology.  Ordered 1 unit of  PRBC to keep the hemoglobin above 8 with concern for ACS. Patient would like to go home and cardiology cleared her for discharge after getting 1 unit of PRBC and they will follow-up as an outpatient.  Although patient does not want to stay but she was desaturating in the low 80s with minor exertion, having some basal crackles and lower extremity edema.  Need to get blood transfusion.  Discussed with daughter again as if she have to leave she might have to leave AMA, family decided to keep her in the hospital for another night and reassess tomorrow.  Patient did not had any chest pain or shortness of breath.  Discussed with DIL on phone. She will continue on her current medications and have a close follow-up with her cardiologist for further recommendations.  11/5: No chest pain or shortness of breath.  Able to weaned off to room air, able to ambulate without any difficulty or desaturation.  Hemoglobin improved to 9.8 after getting 1 unit of PRBC.  Worsening of renal function with BUN of 36 and creatinine of 2.31 after getting IV Lasix which is being held now.  Patient should be encouraged to have p.o. hydration. He was also asked to hold her home Lasix and Zestoretic for next few days until she sees her doctor and have her renal functions rechecked.  Patient wants to go home.  Discussed with DIL on phone. She is being discharged on few new medications which include Imdur, aspirin, Plavix and low-dose metoprolol.  Might not even need Zestoretic anymore. She was instructed to hold home Lasix and Zestoretic. She will need her kidney functions checked by Tuesday.  She will continue with the rest of her home medications with the changes mentioned above and need to have a close follow-up with her providers for further recommendations.   Assessment and Plan: * Acute on chronic diastolic CHF (congestive heart failure) (HCC) echo on 11/27/2020 showed EF of 60 to 65% with grade 1 diastolic dysfunction.   Patient has only trace leg edema, but patient has positive JVD, elevated BNP 513, chest x-ray and CTA showed pulmonary edema, consistent with CHF exacerbation.  Patient received IV Lasix. Continued to desaturate with minor exertion-no baseline oxygen use. Echocardiogram pending. Cardiology cleared her for discharge as she wants to go. -Continue with Lasix -Follow-up echocardiogram -Daily weight and BMP -Strict intake and output -Supplemental oxygen-wean as tolerated  NSTEMI (non-ST elevated myocardial infarction) (HCC) Troponin above 700, which can be due to elevated remaining ischemia or NSTEMI.  Pending echocardiogram to rule out any wall motion abnormalities. Elevated BNP.  Cardiology would like to do stress testing as an outpatient. Heparin was discontinued by cardiology. -Continue with aspirin and statin -Continue to monitor   Essential hypertension -Continue amlodipine and Zestoretic  -IV hydralazine as needed    Type II diabetes mellitus with renal manifestations (HCC) 5.6, well controlled.  Patient taking metformin at home -Sliding scale insulin  Iron deficiency anemia  Hemoglobin 7.9 (7.8 on 06/12/2022).  Patient is following up with Lisa Mcneil for hematology.  Patient received IV Venofer yesterday.  Hemoglobin at 7.2 today. -1 unit of PRBC to keep hemoglobin above 8 for concern of NSTEMI -Continue to monitor -Transfuse if below 8  Chronic kidney disease, stage 3b (HCC) Stable.  Recent baseline creatinine 1.70.  Her creatinine is 1.44, BUN 34, GFR 34 -Follow-up by BMP -Avoid nephrotoxins  HLD (hyperlipidemia) -Continue Crestor   Consultants: Cardiology Procedures performed: None Disposition: Home health Diet recommendation:  Discharge Diet Orders (From admission, onward)     Start     Ordered   06/15/22 0000  Diet - low sodium heart healthy        06/15/22 1141           Cardiac diet DISCHARGE MEDICATION: Allergies as of 06/15/2022        Reactions   Atorvastatin Other (See Comments)   Codeine    Demerol [meperidine]    Polysaccharide Iron Complex Other (See Comments)   Caused constipation   Naproxen Rash   Caused rash on mouth        Medication List     TAKE these medications    amLODipine 10 MG tablet Commonly known as: NORVASC Take 10 mg by mouth daily.   aspirin EC 81 MG tablet Take 1 tablet (81 mg total) by mouth daily. Swallow whole.   Caltrate 600+D Plus Minerals 600-800 MG-UNIT Chew Chew 1 tablet by mouth as directed.   clopidogrel 75 MG tablet Commonly known as: PLAVIX Take 1 tablet (75 mg total) by mouth daily. Start taking on: June 16, 2022   estradiol 0.1 MG/GM vaginal cream Commonly known as: ESTRACE SMARTSIG:Gram(s) Vaginal Twice a Week   ferrous sulfate 325 (65 FE) MG tablet Take 325 mg by mouth daily.   furosemide 20 MG tablet Commonly known as: Lasix Take 1 tablet (20 mg total) by mouth daily as needed for fluid or edema.   gabapentin 100 MG capsule Commonly known as: NEURONTIN Take 100 mg by mouth at bedtime.   isosorbide mononitrate 30 MG 24 hr tablet Commonly known as: IMDUR Take 1 tablet (30 mg total) by mouth  daily. Start taking on: June 16, 2022   lisinopril-hydrochlorothiazide 20-12.5 MG tablet Commonly known as: ZESTORETIC Take 1 tablet by mouth daily. Hold until you sees your doctor. What changed: additional instructions   metFORMIN 500 MG tablet Commonly known as: GLUCOPHAGE Take 500 mg by mouth 2 (two) times daily with a meal.   metoprolol tartrate 25 MG tablet Commonly known as: LOPRESSOR Take 0.5 tablets (12.5 mg total) by mouth 2 (two) times daily.   multivitamin with minerals tablet Take 1 tablet by mouth daily.   nitroGLYCERIN 0.4 MG SL tablet Commonly known as: NITROSTAT Place 1 tablet (0.4 mg total) under the tongue every 5 (five) minutes as needed for chest pain.   omeprazole 20 MG capsule Commonly known as: PRILOSEC Take 20 mg by mouth  2 (two) times daily.   rosuvastatin 5 MG tablet Commonly known as: CRESTOR Take 5 mg by mouth at bedtime.   traZODone 50 MG tablet Commonly known as: DESYREL Take 50 mg by mouth at bedtime.        Discharge Exam: Filed Weights   06/13/22 0730 06/14/22 0500 06/15/22 0310  Weight: 59.9 kg 56.2 kg 56.2 kg   General.  Frail elderly lady, in no acute distress. Pulmonary.  Lungs clear bilaterally, normal respiratory effort. CV.  Regular rate and rhythm, no JVD, rub or murmur. Abdomen.  Soft, nontender, nondistended, BS positive. CNS.  Alert and oriented .  No focal neurologic deficit. Extremities.  No edema, no cyanosis, pulses intact and symmetrical. Psychiatry.  Judgment and insight appears normal.   Condition at discharge: stable  The results of significant diagnostics from this hospitalization (including imaging, microbiology, ancillary and laboratory) are listed below for reference.   Imaging Studies: CT Angio Chest PE W and/or Wo Contrast  Result Date: 06/13/2022 CLINICAL DATA:  Pulmonary embolism (PE) suspected, high prob EXAM: CT ANGIOGRAPHY CHEST WITH CONTRAST TECHNIQUE: Multidetector CT imaging of the chest was performed using the standard protocol during bolus administration of intravenous contrast. Multiplanar CT image reconstructions and MIPs were obtained to evaluate the vascular anatomy. RADIATION DOSE REDUCTION: This exam was performed according to the departmental dose-optimization program which includes automated exposure control, adjustment of the mA and/or kV according to patient size and/or use of iterative reconstruction technique. CONTRAST:  69mL OMNIPAQUE IOHEXOL 350 MG/ML SOLN COMPARISON:  CT a chest 11/27/2020. FINDINGS: Cardiovascular: Satisfactory opacification of the pulmonary arteries to the segmental level. No evidence of pulmonary embolism. Mild cardiomegaly.No pericardial disease. Coronary artery atherosclerosis. Moderate atherosclerosis of the thoracic  aorta. Mediastinum/Nodes: Mildly prominent mediastinal lymph nodes, likely reactive. The thyroid is unremarkable. Esophagus is unremarkable. Lungs/Pleura: Moderate centrilobular and paraseptal emphysema. Mild diffuse bronchial wall thickening, lower lung predominant. There is diffuse interlobular septal thickening and ground-glass opacities bilaterally. There are more confluent airspace consolidations in the lung bases, likely atelectasis. There are trace pleural effusions, right greater than left. No evidence of pneumothorax. Upper Abdomen: No acute findings. Calcified granulomas in the spleen. Musculoskeletal: Unchanged mild chronic T2 superior endplate compression deformity. No acute osseous abnormality. No suspicious osseous lesion. Review of the MIP images confirms the above findings. IMPRESSION: Cardiomegaly with moderate pulmonary edema and trace pleural effusions. Bibasilar airspace consolidations favored to be atelectasis. No evidence of pulmonary embolism. Emphysema. Electronically Signed   By: Maurine Simmering M.D.   On: 06/13/2022 10:04   DG Chest Port 1 View  Result Date: 06/13/2022 CLINICAL DATA:  RIGHT-sided chest pain. EXAM: PORTABLE CHEST 1 VIEW COMPARISON:  Chest XR and CTA PE, 11/27/2020.  FINDINGS: Cardiac silhouette is mildly enlarged. Aortic arch calcifications. Lungs are hypoinflated. Basilar and perihilar-predominant interstitial thickening. No discrete consolidation. Trivial RIGHT pleural effusion. No pneumothorax. No acute displaced fracture. IMPRESSION: 1. Cardiomegaly with mild interstitial pulmonary edema. 2.  Aortic Atherosclerosis (ICD10-I70.0). Electronically Signed   By: Michaelle Birks M.D.   On: 06/13/2022 07:49    Microbiology: Results for orders placed or performed during the hospital encounter of 06/13/22  Resp Panel by RT-PCR (Flu A&B, Covid) Anterior Nasal Swab     Status: None   Collection Time: 06/13/22  7:49 AM   Specimen: Anterior Nasal Swab  Result Value Ref Range  Status   SARS Coronavirus 2 by RT PCR NEGATIVE NEGATIVE Final    Comment: (NOTE) SARS-CoV-2 target nucleic acids are NOT DETECTED.  The SARS-CoV-2 RNA is generally detectable in upper respiratory specimens during the acute phase of infection. The lowest concentration of SARS-CoV-2 viral copies this assay can detect is 138 copies/mL. A negative result does not preclude SARS-Cov-2 infection and should not be used as the sole basis for treatment or other patient management decisions. A negative result may occur with  improper specimen collection/handling, submission of specimen other than nasopharyngeal swab, presence of viral mutation(s) within the areas targeted by this assay, and inadequate number of viral copies(<138 copies/mL). A negative result must be combined with clinical observations, patient history, and epidemiological information. The expected result is Negative.  Fact Sheet for Patients:  EntrepreneurPulse.com.au  Fact Sheet for Healthcare Providers:  IncredibleEmployment.be  This test is no t yet approved or cleared by the [place] FDA and  has been authorized for detection and/or diagnosis of SARS-CoV-2 by FDA under an Emergency Use Authorization (EUA). This EUA will remain  in effect (meaning this test can be used) for the duration of the COVID-19 declaration under Section 564(b)(1) of the Act, 21 U.S.C.section 360bbb-3(b)(1), unless the authorization is terminated  or revoked sooner.       Influenza A by PCR NEGATIVE NEGATIVE Final   Influenza B by PCR NEGATIVE NEGATIVE Final    Comment: (NOTE) The Xpert Xpress SARS-CoV-2/FLU/RSV plus assay is intended as an aid in the diagnosis of influenza from Nasopharyngeal swab specimens and should not be used as a sole basis for treatment. Nasal washings and aspirates are unacceptable for Xpert Xpress SARS-CoV-2/FLU/RSV testing.  Fact Sheet for  Patients: EntrepreneurPulse.com.au  Fact Sheet for Healthcare Providers: IncredibleEmployment.be  This test is not yet approved or cleared by the [place] FDA and has been authorized for detection and/or diagnosis of SARS-CoV-2 by FDA under an Emergency Use Authorization (EUA). This EUA will remain in effect (meaning this test can be used) for the duration of the COVID-19 declaration under Section 564(b)(1) of the Act, 21 U.S.C. section 360bbb-3(b)(1), unless the authorization is terminated or revoked.  Performed at [facility], [address] [place], [place] [address]     Labs: CBC: Recent Labs  Lab 06/12/22 1321 06/13/22 0720 06/14/22 0615 06/15/22 1037  WBC 6.2 6.5 5.6 5.9  NEUTROABS 4.8 4.8  --   --   HGB 7.8* 7.9* 7.2* 9.8*  HCT 24.9* 25.1* 22.4* 29.7*  MCV 96.9 94.0 93.3 90.3  PLT 223 249 240 829   Basic Metabolic Panel: Recent Labs  Lab 06/13/22 0720 06/14/22 0615 06/15/22 1037  NA 139 138 139  K 4.1 4.2 4.1  CL 106 103 103  CO2 23 28 26   GLUCOSE 158* 119* 142*  BUN 34* 31* 36*  CREATININE 1.44*  1.61* 2.31*  CALCIUM 9.6 9.4 9.3  MG  --  1.7  --    Liver Function Tests: Recent Labs  Lab 06/13/22 0720  AST 21  ALT 14  ALKPHOS 60  BILITOT 0.5  PROT 7.2  ALBUMIN 3.7   CBG: Recent Labs  Lab 06/14/22 0731 06/14/22 1207 06/14/22 1716 06/14/22 2056 06/15/22 0813  GLUCAP 122* 206* 125* 187* 132*    Discharge time spent: greater than 30 minutes.  This record has been created using Systems analyst. Errors have been sought and corrected,but may not always be located. Such creation errors do not reflect on the standard of care.   Signed: Lorella Nimrod, MD [facility] 06/15/2022

## 2022-06-15 NOTE — Progress Notes (Signed)
SATURATION QUALIFICATIONS: (This note is used to comply with regulatory documentation for home oxygen)  Patient Saturations on Room Air at Rest = 92%  Patient Saturations on Room Air while Ambulating = 95%  Patient Saturations on n/a Liters of oxygen while Ambulating = n/a%  Please briefly explain why patient needs home oxygen:

## 2022-06-15 NOTE — TOC Transition Note (Signed)
Transition of Care [facility]) - CM/SW Discharge Note   Patient Details  Name: Adrine Hayworth MRN: [id number] Date of Birth: 01-23-1928  Transition of Care [facility]) CM/SW Contact:  Izola Price, RN Phone Number: 06/15/2022, 11:56 AM   Clinical Narrative: 11/5: Patient has discharge orders with [facility] orders. Spoke with patient and she is declining [employer] services as she is "going back to work Wednesday" and does not feel she needs it. Oxygen ambulation saturation test did not indicate need for home oxygen and one was ordered. Discussed with patient about obtaining an oxygen saturation finger probe to monitor herself at home/work and to contact her PCP if she changes her mind on [facility] or has other needs arise post acute care discharge. Patient verbalized understanding and no other questions or concerns regarding discharge. Updated provider on [place] declination. Simmie Davies RN CM       Final next level of care: Home/Self Care ([place] ordered [facility].) Barriers to Discharge: Barriers Resolved   Patient Goals and CMS Choice        Discharge Placement                       Discharge Plan and Services                DME Arranged: N/A DME Agency: NA       HH Arranged: Patient Refused [place] Agency: NA        Social Determinants of Health (SDOH) Interventions     Readmission Risk Interventions     No data to display

## 2022-06-15 NOTE — Progress Notes (Signed)
[facility] Cardiology    SUBJECTIVE: Patient states he feels reasonably well no chest pain or shortness of breath he feels fine ambulating in the room well feels well enough to go home would like to go back to work   Vitals:   06/15/22 0433 06/15/22 0814 06/15/22 1109 06/15/22 1217  BP: (!) 131/47 (!) 120/48 (!) 124/46 (!) 97/39  Pulse: 60 65 84 (!) 57  Resp:  18 18 17   Temp: 97.6 F (36.4 C)  97.6 F (36.4 C)   TempSrc: Oral     SpO2: 94% 95% 94% 91%  Weight:      Height:         Intake/Output Summary (Last 24 hours) at 06/15/2022 1328 Last data filed at 06/15/2022 0700 Gross per 24 hour  Intake 488.5 ml  Output 600 ml  Net -111.5 ml      PHYSICAL EXAM  General: Well developed, well nourished, in no acute distress HEENT:  Normocephalic and atramatic Neck:  No JVD.  Lungs: Clear bilaterally to auscultation and percussion. Heart: HRRR . Normal S1 and S2 without gallops or murmurs.  Abdomen: Bowel sounds are positive, abdomen soft and non-tender  Msk:  Back normal, normal gait. Normal strength and tone for age. Extremities: No clubbing, cyanosis or edema.   Neuro: Alert and oriented X 3. Psych:  Good affect, responds appropriately   LABS: Basic Metabolic Panel: Recent Labs    06/14/22 0615 06/15/22 1037  NA 138 139  K 4.2 4.1  CL 103 103  CO2 28 26  GLUCOSE 119* 142*  BUN 31* 36*  CREATININE 1.61* 2.31*  CALCIUM 9.4 9.3  MG 1.7  --    Liver Function Tests: Recent Labs    06/13/22 0720  AST 21  ALT 14  ALKPHOS 60  BILITOT 0.5  PROT 7.2  ALBUMIN 3.7   No results for input(s): "LIPASE", "AMYLASE" in the last 72 hours. CBC: Recent Labs    06/13/22 0720 06/14/22 0615 06/15/22 1037  WBC 6.5 5.6 5.9  NEUTROABS 4.8  --   --   HGB 7.9* 7.2* 9.8*  HCT 25.1* 22.4* 29.7*  MCV 94.0 93.3 90.3  PLT 249 240 255   Cardiac Enzymes: No results for input(s): "CKTOTAL", "CKMB", "CKMBINDEX", "TROPONINI" in the last 72 hours. BNP: Invalid input(s):  "POCBNP" D-Dimer: No results for input(s): "DDIMER" in the last 72 hours. Hemoglobin A1C: No results for input(s): "HGBA1C" in the last 72 hours. Fasting Lipid Panel: Recent Labs    06/14/22 0615  CHOL 101  HDL 37*  LDLCALC 47  TRIG 86  CHOLHDL 2.7   Thyroid Function Tests: No results for input(s): "TSH", "T4TOTAL", "T3FREE", "THYROIDAB" in the last 72 hours.  Invalid input(s): "FREET3" Anemia Panel: No results for input(s): "VITAMINB12", "FOLATE", "FERRITIN", "TIBC", "IRON", "RETICCTPCT" in the last 72 hours.  No results found.   Echo preserved overall left ventricular function EF of 60%  TELEMETRY: Normal sinus rhythm rate of 75 nonspecific ST-T wave changes:  ASSESSMENT AND PLAN:  Principal Problem:   Acute on chronic diastolic CHF (congestive heart failure) (HCC) Active Problems:   Iron deficiency anemia   Essential hypertension   NSTEMI (non-ST elevated myocardial infarction) (HCC)   HLD (hyperlipidemia)   Type II diabetes mellitus with renal manifestations (HCC)   Chronic kidney disease, stage 3b (HCC)    Plan Elevated troponin possible non-STEMI no further symptoms of chest pain or shortness of breath would recommend continued medical therapy aspirin Plavix beta-blockers Renal insufficiency will  avoid nephrotoxic drugs ACE ARB or Entresto Continue diuretic therapy for heart failure shortness of breath Recommend for the patient to nephrology for renal insufficiency Continue hypertension control amlodipine or hydralazine beta-blockers Imdur Consider functional study will defer cardiac cath for now because of renal insufficiency Have the patient discharged home follow-up with cardiology early   Yolonda Kida, MD 06/15/2022 1:28 PM

## 2022-06-15 NOTE — Plan of Care (Signed)
  Problem: Education: Goal: Ability to describe self-care measures that may prevent or decrease complications (Diabetes Survival Skills Education) will improve Outcome: Progressing   Problem: Coping: Goal: Ability to adjust to condition or change in health will improve Outcome: Progressing   Problem: Fluid Volume: Goal: Ability to maintain a balanced intake and output will improve Outcome: Progressing   

## 2022-06-15 NOTE — Progress Notes (Signed)
Lisa Mcneil to be D/C'd Home per MD order.  Discussed prescriptions and follow up appointments with the patient. Prescriptions given to patient, medication list explained in detail. Pt verbalized understanding.  Allergies as of 06/15/2022       Reactions   Atorvastatin Other (See Comments)   Codeine    Demerol [meperidine]    Polysaccharide Iron Complex Other (See Comments)   Caused constipation   Naproxen Rash   Caused rash on mouth        Medication List     TAKE these medications    amLODipine 10 MG tablet Commonly known as: NORVASC Take 10 mg by mouth daily.   aspirin EC 81 MG tablet Take 1 tablet (81 mg total) by mouth daily. Swallow whole.   Caltrate 600+D Plus Minerals 600-800 MG-UNIT Chew Chew 1 tablet by mouth as directed.   clopidogrel 75 MG tablet Commonly known as: PLAVIX Take 1 tablet (75 mg total) by mouth daily. Start taking on: June 16, 2022   estradiol 0.1 MG/GM vaginal cream Commonly known as: ESTRACE SMARTSIG:Gram(s) Vaginal Twice a Week   ferrous sulfate 325 (65 FE) MG tablet Take 325 mg by mouth daily.   furosemide 20 MG tablet Commonly known as: Lasix Take 1 tablet (20 mg total) by mouth daily as needed for fluid or edema.   gabapentin 100 MG capsule Commonly known as: NEURONTIN Take 100 mg by mouth at bedtime.   isosorbide mononitrate 30 MG 24 hr tablet Commonly known as: IMDUR Take 1 tablet (30 mg total) by mouth daily. Start taking on: June 16, 2022   lisinopril-hydrochlorothiazide 20-12.5 MG tablet Commonly known as: ZESTORETIC Take 1 tablet by mouth daily. Hold until you sees your doctor. What changed: additional instructions   metFORMIN 500 MG tablet Commonly known as: GLUCOPHAGE Take 500 mg by mouth 2 (two) times daily with a meal.   metoprolol tartrate 25 MG tablet Commonly known as: LOPRESSOR Take 0.5 tablets (12.5 mg total) by mouth 2 (two) times daily.   multivitamin with minerals tablet Take 1 tablet  by mouth daily.   nitroGLYCERIN 0.4 MG SL tablet Commonly known as: NITROSTAT Place 1 tablet (0.4 mg total) under the tongue every 5 (five) minutes as needed for chest pain.   omeprazole 20 MG capsule Commonly known as: PRILOSEC Take 20 mg by mouth 2 (two) times daily.   rosuvastatin 5 MG tablet Commonly known as: CRESTOR Take 5 mg by mouth at bedtime.   traZODone 50 MG tablet Commonly known as: DESYREL Take 50 mg by mouth at bedtime.        Vitals:   06/15/22 1109 06/15/22 1217  BP: (!) 124/46 (!) 97/39  Pulse: 84 (!) 57  Resp: 18 17  Temp: 97.6 F (36.4 C)   SpO2: 94% 91%    Tele box removed and returned. Skin clean, dry and intact without evidence of skin break down, no evidence of skin tears noted. IV catheter discontinued intact. Site without signs and symptoms of complications. Dressing and pressure applied. Pt denies pain at this time. No complaints noted.  An After Visit Summary was printed and given to the patient. Patient escorted via [place], and D/C home via private auto.  Rolley Sims

## 2022-06-15 NOTE — Progress Notes (Incomplete)
Echocardiogram 2D Echocardiogram has been performed.  Lisa Mcneil 06/15/2022, 2:15 PM

## 2022-06-17 DIAGNOSIS — N183 Chronic kidney disease, stage 3 unspecified: Secondary | ICD-10-CM | POA: Diagnosis not present

## 2022-06-17 DIAGNOSIS — I5032 Chronic diastolic (congestive) heart failure: Secondary | ICD-10-CM | POA: Diagnosis not present

## 2022-06-18 ENCOUNTER — Inpatient Hospital Stay: Payer: Medicare HMO

## 2022-06-18 VITALS — BP 132/40 | HR 62 | Temp 97.8°F | Resp 18

## 2022-06-18 DIAGNOSIS — I252 Old myocardial infarction: Secondary | ICD-10-CM | POA: Diagnosis not present

## 2022-06-18 DIAGNOSIS — I5032 Chronic diastolic (congestive) heart failure: Secondary | ICD-10-CM | POA: Diagnosis not present

## 2022-06-18 DIAGNOSIS — N184 Chronic kidney disease, stage 4 (severe): Secondary | ICD-10-CM | POA: Diagnosis not present

## 2022-06-18 DIAGNOSIS — Z87891 Personal history of nicotine dependence: Secondary | ICD-10-CM | POA: Diagnosis not present

## 2022-06-18 DIAGNOSIS — D508 Other iron deficiency anemias: Secondary | ICD-10-CM

## 2022-06-18 DIAGNOSIS — E1122 Type 2 diabetes mellitus with diabetic chronic kidney disease: Secondary | ICD-10-CM | POA: Diagnosis not present

## 2022-06-18 DIAGNOSIS — E1169 Type 2 diabetes mellitus with other specified complication: Secondary | ICD-10-CM | POA: Diagnosis not present

## 2022-06-18 DIAGNOSIS — R011 Cardiac murmur, unspecified: Secondary | ICD-10-CM | POA: Diagnosis not present

## 2022-06-18 DIAGNOSIS — I130 Hypertensive heart and chronic kidney disease with heart failure and stage 1 through stage 4 chronic kidney disease, or unspecified chronic kidney disease: Secondary | ICD-10-CM | POA: Diagnosis not present

## 2022-06-18 DIAGNOSIS — D509 Iron deficiency anemia, unspecified: Secondary | ICD-10-CM | POA: Diagnosis not present

## 2022-06-18 DIAGNOSIS — E785 Hyperlipidemia, unspecified: Secondary | ICD-10-CM | POA: Diagnosis not present

## 2022-06-18 MED ORDER — SODIUM CHLORIDE 0.9 % IV SOLN
INTRAVENOUS | Status: DC
  Filled 2022-06-18: qty 250

## 2022-06-18 MED ORDER — SODIUM CHLORIDE 0.9 % IV SOLN
200.0000 mg | Freq: Once | INTRAVENOUS | Status: AC
  Administered 2022-06-18: 200 mg via INTRAVENOUS
  Filled 2022-06-18: qty 200

## 2022-06-19 DIAGNOSIS — I350 Nonrheumatic aortic (valve) stenosis: Secondary | ICD-10-CM | POA: Diagnosis not present

## 2022-06-19 DIAGNOSIS — N2581 Secondary hyperparathyroidism of renal origin: Secondary | ICD-10-CM | POA: Diagnosis not present

## 2022-06-19 DIAGNOSIS — I10 Essential (primary) hypertension: Secondary | ICD-10-CM | POA: Diagnosis not present

## 2022-06-19 DIAGNOSIS — N183 Chronic kidney disease, stage 3 unspecified: Secondary | ICD-10-CM | POA: Diagnosis not present

## 2022-06-19 DIAGNOSIS — K219 Gastro-esophageal reflux disease without esophagitis: Secondary | ICD-10-CM | POA: Diagnosis not present

## 2022-06-19 DIAGNOSIS — E782 Mixed hyperlipidemia: Secondary | ICD-10-CM | POA: Diagnosis not present

## 2022-06-19 DIAGNOSIS — N184 Chronic kidney disease, stage 4 (severe): Secondary | ICD-10-CM | POA: Diagnosis not present

## 2022-06-19 DIAGNOSIS — E119 Type 2 diabetes mellitus without complications: Secondary | ICD-10-CM | POA: Diagnosis not present

## 2022-06-19 DIAGNOSIS — I5032 Chronic diastolic (congestive) heart failure: Secondary | ICD-10-CM | POA: Diagnosis not present

## 2022-06-19 DIAGNOSIS — E1122 Type 2 diabetes mellitus with diabetic chronic kidney disease: Secondary | ICD-10-CM | POA: Diagnosis not present

## 2022-06-19 DIAGNOSIS — N1832 Chronic kidney disease, stage 3b: Secondary | ICD-10-CM | POA: Diagnosis not present

## 2022-06-19 MED FILL — Iron Sucrose Inj 20 MG/ML (Fe Equiv): INTRAVENOUS | Qty: 10 | Status: AC

## 2022-06-20 ENCOUNTER — Inpatient Hospital Stay: Payer: Medicare HMO

## 2022-06-20 VITALS — BP 135/45 | HR 68

## 2022-06-20 DIAGNOSIS — D508 Other iron deficiency anemias: Secondary | ICD-10-CM

## 2022-06-20 DIAGNOSIS — D509 Iron deficiency anemia, unspecified: Secondary | ICD-10-CM | POA: Diagnosis not present

## 2022-06-20 DIAGNOSIS — Z87891 Personal history of nicotine dependence: Secondary | ICD-10-CM | POA: Diagnosis not present

## 2022-06-20 MED ORDER — SODIUM CHLORIDE 0.9 % IV SOLN
200.0000 mg | Freq: Once | INTRAVENOUS | Status: AC
  Administered 2022-06-20: 200 mg via INTRAVENOUS
  Filled 2022-06-20: qty 200

## 2022-06-20 MED ORDER — SODIUM CHLORIDE 0.9 % IV SOLN
INTRAVENOUS | Status: DC
  Filled 2022-06-20: qty 250

## 2022-06-25 ENCOUNTER — Inpatient Hospital Stay: Payer: Medicare HMO

## 2022-06-25 VITALS — BP 109/39 | HR 75 | Resp 17

## 2022-06-25 DIAGNOSIS — D509 Iron deficiency anemia, unspecified: Secondary | ICD-10-CM | POA: Diagnosis not present

## 2022-06-25 DIAGNOSIS — Z87891 Personal history of nicotine dependence: Secondary | ICD-10-CM | POA: Diagnosis not present

## 2022-06-25 DIAGNOSIS — D508 Other iron deficiency anemias: Secondary | ICD-10-CM

## 2022-06-25 MED ORDER — SODIUM CHLORIDE 0.9% FLUSH
10.0000 mL | Freq: Once | INTRAVENOUS | Status: AC | PRN
  Administered 2022-06-25: 10 mL
  Filled 2022-06-25: qty 10

## 2022-06-25 MED ORDER — SODIUM CHLORIDE 0.9 % IV SOLN
200.0000 mg | Freq: Once | INTRAVENOUS | Status: AC
  Administered 2022-06-25: 200 mg via INTRAVENOUS
  Filled 2022-06-25: qty 10

## 2022-06-25 MED ORDER — SODIUM CHLORIDE 0.9 % IV SOLN
Freq: Once | INTRAVENOUS | Status: AC
  Filled 2022-06-25: qty 250

## 2022-06-25 MED ORDER — SODIUM CHLORIDE 0.9 % IV SOLN
INTRAVENOUS | Status: DC
  Filled 2022-06-25: qty 250

## 2022-06-25 NOTE — Progress Notes (Signed)
Patient tolerated Venofer infusion well. Explained recommendation of 30 min post monitoring. Patient refused to wait post monitoring. Educated on what signs to watch for & to call with any concerns. No question, discharged. Stable  

## 2022-06-26 ENCOUNTER — Other Ambulatory Visit: Payer: Self-pay

## 2022-06-26 ENCOUNTER — Inpatient Hospital Stay
Admission: EM | Admit: 2022-06-26 | Discharge: 2022-06-28 | DRG: 280 | Disposition: A | Discharge status: Home / Self Care | Payer: Medicare HMO | Attending: Internal Medicine | Admitting: Internal Medicine

## 2022-06-26 ENCOUNTER — Encounter: Payer: Self-pay | Admitting: Internal Medicine

## 2022-06-26 ENCOUNTER — Emergency Department: Payer: Medicare HMO

## 2022-06-26 DIAGNOSIS — N1832 Chronic kidney disease, stage 3b: Secondary | ICD-10-CM | POA: Diagnosis not present

## 2022-06-26 DIAGNOSIS — J9601 Acute respiratory failure with hypoxia: Secondary | ICD-10-CM | POA: Diagnosis present

## 2022-06-26 DIAGNOSIS — D631 Anemia in chronic kidney disease: Secondary | ICD-10-CM | POA: Diagnosis not present

## 2022-06-26 DIAGNOSIS — E1122 Type 2 diabetes mellitus with diabetic chronic kidney disease: Secondary | ICD-10-CM | POA: Diagnosis present

## 2022-06-26 DIAGNOSIS — I110 Hypertensive heart disease with heart failure: Secondary | ICD-10-CM | POA: Diagnosis not present

## 2022-06-26 DIAGNOSIS — J90 Pleural effusion, not elsewhere classified: Secondary | ICD-10-CM | POA: Diagnosis not present

## 2022-06-26 DIAGNOSIS — Z66 Do not resuscitate: Secondary | ICD-10-CM | POA: Diagnosis not present

## 2022-06-26 DIAGNOSIS — G2581 Restless legs syndrome: Secondary | ICD-10-CM | POA: Diagnosis present

## 2022-06-26 DIAGNOSIS — I5033 Acute on chronic diastolic (congestive) heart failure: Secondary | ICD-10-CM | POA: Diagnosis not present

## 2022-06-26 DIAGNOSIS — Z9842 Cataract extraction status, left eye: Secondary | ICD-10-CM

## 2022-06-26 DIAGNOSIS — E785 Hyperlipidemia, unspecified: Secondary | ICD-10-CM | POA: Diagnosis present

## 2022-06-26 DIAGNOSIS — N184 Chronic kidney disease, stage 4 (severe): Secondary | ICD-10-CM | POA: Diagnosis present

## 2022-06-26 DIAGNOSIS — I214 Non-ST elevation (NSTEMI) myocardial infarction: Secondary | ICD-10-CM | POA: Diagnosis not present

## 2022-06-26 DIAGNOSIS — I2510 Atherosclerotic heart disease of native coronary artery without angina pectoris: Secondary | ICD-10-CM | POA: Diagnosis present

## 2022-06-26 DIAGNOSIS — R0789 Other chest pain: Secondary | ICD-10-CM | POA: Diagnosis not present

## 2022-06-26 DIAGNOSIS — Z87891 Personal history of nicotine dependence: Secondary | ICD-10-CM | POA: Diagnosis not present

## 2022-06-26 DIAGNOSIS — J811 Chronic pulmonary edema: Secondary | ICD-10-CM | POA: Diagnosis not present

## 2022-06-26 DIAGNOSIS — E1169 Type 2 diabetes mellitus with other specified complication: Secondary | ICD-10-CM | POA: Diagnosis present

## 2022-06-26 DIAGNOSIS — Z961 Presence of intraocular lens: Secondary | ICD-10-CM | POA: Diagnosis present

## 2022-06-26 DIAGNOSIS — D509 Iron deficiency anemia, unspecified: Secondary | ICD-10-CM | POA: Diagnosis not present

## 2022-06-26 DIAGNOSIS — I130 Hypertensive heart and chronic kidney disease with heart failure and stage 1 through stage 4 chronic kidney disease, or unspecified chronic kidney disease: Secondary | ICD-10-CM | POA: Diagnosis not present

## 2022-06-26 DIAGNOSIS — Z888 Allergy status to other drugs, medicaments and biological substances status: Secondary | ICD-10-CM | POA: Diagnosis not present

## 2022-06-26 DIAGNOSIS — Z7982 Long term (current) use of aspirin: Secondary | ICD-10-CM

## 2022-06-26 DIAGNOSIS — D649 Anemia, unspecified: Secondary | ICD-10-CM | POA: Diagnosis present

## 2022-06-26 DIAGNOSIS — I350 Nonrheumatic aortic (valve) stenosis: Secondary | ICD-10-CM | POA: Diagnosis present

## 2022-06-26 DIAGNOSIS — Z79899 Other long term (current) drug therapy: Secondary | ICD-10-CM

## 2022-06-26 DIAGNOSIS — K219 Gastro-esophageal reflux disease without esophagitis: Secondary | ICD-10-CM | POA: Diagnosis present

## 2022-06-26 DIAGNOSIS — Z885 Allergy status to narcotic agent status: Secondary | ICD-10-CM

## 2022-06-26 DIAGNOSIS — Z9841 Cataract extraction status, right eye: Secondary | ICD-10-CM | POA: Diagnosis not present

## 2022-06-26 DIAGNOSIS — D500 Iron deficiency anemia secondary to blood loss (chronic): Secondary | ICD-10-CM | POA: Diagnosis present

## 2022-06-26 DIAGNOSIS — I959 Hypotension, unspecified: Secondary | ICD-10-CM | POA: Diagnosis not present

## 2022-06-26 DIAGNOSIS — G4700 Insomnia, unspecified: Secondary | ICD-10-CM | POA: Diagnosis present

## 2022-06-26 DIAGNOSIS — R0902 Hypoxemia: Secondary | ICD-10-CM | POA: Diagnosis not present

## 2022-06-26 DIAGNOSIS — Z20822 Contact with and (suspected) exposure to covid-19: Secondary | ICD-10-CM | POA: Diagnosis not present

## 2022-06-26 DIAGNOSIS — I509 Heart failure, unspecified: Secondary | ICD-10-CM | POA: Diagnosis not present

## 2022-06-26 DIAGNOSIS — I10 Essential (primary) hypertension: Secondary | ICD-10-CM | POA: Diagnosis present

## 2022-06-26 DIAGNOSIS — J80 Acute respiratory distress syndrome: Secondary | ICD-10-CM | POA: Diagnosis not present

## 2022-06-26 DIAGNOSIS — I5A Non-ischemic myocardial injury (non-traumatic): Secondary | ICD-10-CM | POA: Diagnosis not present

## 2022-06-26 DIAGNOSIS — R0602 Shortness of breath: Secondary | ICD-10-CM | POA: Diagnosis not present

## 2022-06-26 DIAGNOSIS — R079 Chest pain, unspecified: Secondary | ICD-10-CM | POA: Diagnosis not present

## 2022-06-26 DIAGNOSIS — E1129 Type 2 diabetes mellitus with other diabetic kidney complication: Secondary | ICD-10-CM | POA: Diagnosis present

## 2022-06-26 DIAGNOSIS — Z7984 Long term (current) use of oral hypoglycemic drugs: Secondary | ICD-10-CM | POA: Diagnosis not present

## 2022-06-26 DIAGNOSIS — I2489 Other forms of acute ischemic heart disease: Secondary | ICD-10-CM | POA: Diagnosis not present

## 2022-06-26 LAB — BASIC METABOLIC PANEL WITH GFR
Anion gap: 10 (ref 5–15)
BUN: 28 mg/dL — ABNORMAL HIGH (ref 8–23)
CO2: 18 mmol/L — ABNORMAL LOW (ref 22–32)
Calcium: 9.1 mg/dL (ref 8.9–10.3)
Chloride: 108 mmol/L (ref 98–111)
Creatinine, Ser: 1.77 mg/dL — ABNORMAL HIGH (ref 0.44–1.00)
GFR, Estimated: 27 mL/min — ABNORMAL LOW
Glucose, Bld: 180 mg/dL — ABNORMAL HIGH (ref 70–99)
Potassium: 4.1 mmol/L (ref 3.5–5.1)
Sodium: 136 mmol/L (ref 135–145)

## 2022-06-26 LAB — CBC
HCT: 22 % — ABNORMAL LOW (ref 36.0–46.0)
HCT: 22.4 % — ABNORMAL LOW (ref 36.0–46.0)
HCT: 24.9 % — ABNORMAL LOW (ref 36.0–46.0)
HCT: 24 % — ABNORMAL LOW (ref 36.0–46.0)
Hemoglobin: 6.9 g/dL — ABNORMAL LOW (ref 12.0–15.0)
Hemoglobin: 7 g/dL — ABNORMAL LOW (ref 12.0–15.0)
Hemoglobin: 8 g/dL — ABNORMAL LOW (ref 12.0–15.0)
Hemoglobin: 7.8 g/dL — ABNORMAL LOW (ref 12.0–15.0)
MCH: 31.2 pg (ref 26.0–34.0)
MCH: 31 pg (ref 26.0–34.0)
MCH: 30.4 pg (ref 26.0–34.0)
MCH: 30.6 pg (ref 26.0–34.0)
MCHC: 31.4 g/dL (ref 30.0–36.0)
MCHC: 31.3 g/dL (ref 30.0–36.0)
MCHC: 32.1 g/dL (ref 30.0–36.0)
MCHC: 32.5 g/dL (ref 30.0–36.0)
MCV: 99.5 fL (ref 80.0–100.0)
MCV: 99.1 fL (ref 80.0–100.0)
MCV: 94.7 fL (ref 80.0–100.0)
MCV: 94.1 fL (ref 80.0–100.0)
Platelets: 258 10*3/uL (ref 150–400)
Platelets: 267 10*3/uL (ref 150–400)
Platelets: 249 10*3/uL (ref 150–400)
Platelets: 241 10*3/uL (ref 150–400)
RBC: 2.21 MIL/uL — ABNORMAL LOW (ref 3.87–5.11)
RBC: 2.26 MIL/uL — ABNORMAL LOW (ref 3.87–5.11)
RBC: 2.63 MIL/uL — ABNORMAL LOW (ref 3.87–5.11)
RBC: 2.55 MIL/uL — ABNORMAL LOW (ref 3.87–5.11)
RDW: 17.7 % — ABNORMAL HIGH (ref 11.5–15.5)
RDW: 17.5 % — ABNORMAL HIGH (ref 11.5–15.5)
RDW: 17.8 % — ABNORMAL HIGH (ref 11.5–15.5)
RDW: 17.8 % — ABNORMAL HIGH (ref 11.5–15.5)
WBC: 7.9 10*3/uL (ref 4.0–10.5)
WBC: 7.7 10*3/uL (ref 4.0–10.5)
WBC: 7.6 10*3/uL (ref 4.0–10.5)
WBC: 6.5 10*3/uL (ref 4.0–10.5)
nRBC: 0.4 % — ABNORMAL HIGH (ref 0.0–0.2)
nRBC: 0.5 % — ABNORMAL HIGH (ref 0.0–0.2)
nRBC: 0.5 % — ABNORMAL HIGH (ref 0.0–0.2)
nRBC: 0 % (ref 0.0–0.2)

## 2022-06-26 LAB — PROTIME-INR
INR: 1.1 (ref 0.8–1.2)
Prothrombin Time: 13.6 seconds (ref 11.4–15.2)

## 2022-06-26 LAB — GLUCOSE, CAPILLARY: Glucose-Capillary: 192 mg/dL — ABNORMAL HIGH (ref 70–99)

## 2022-06-26 LAB — FERRITIN: Ferritin: 216 ng/mL (ref 11–307)

## 2022-06-26 LAB — CBG MONITORING, ED
Glucose-Capillary: 214 mg/dL — ABNORMAL HIGH (ref 70–99)
Glucose-Capillary: 154 mg/dL — ABNORMAL HIGH (ref 70–99)

## 2022-06-26 LAB — VITAMIN B12: Vitamin B-12: 285 pg/mL (ref 180–914)

## 2022-06-26 LAB — HEMOGLOBIN A1C
Hgb A1c MFr Bld: 4.8 % (ref 4.8–5.6)
Mean Plasma Glucose: 91.06 mg/dL

## 2022-06-26 LAB — IRON AND TIBC
Iron: 137 ug/dL (ref 28–170)
Saturation Ratios: 42 % — ABNORMAL HIGH (ref 10.4–31.8)
TIBC: 329 ug/dL (ref 250–450)
UIBC: 192 ug/dL

## 2022-06-26 LAB — FOLATE: Folate: 20.9 ng/mL

## 2022-06-26 LAB — RETICULOCYTES
Immature Retic Fract: 33.5 % — ABNORMAL HIGH (ref 2.3–15.9)
RBC.: 2.25 MIL/uL — ABNORMAL LOW (ref 3.87–5.11)
Retic Count, Absolute: 202.7 10*3/uL — ABNORMAL HIGH (ref 19.0–186.0)
Retic Ct Pct: 9 % — ABNORMAL HIGH (ref 0.4–3.1)

## 2022-06-26 LAB — APTT: aPTT: 33 seconds (ref 24–36)

## 2022-06-26 LAB — PREPARE RBC (CROSSMATCH)

## 2022-06-26 LAB — TROPONIN I (HIGH SENSITIVITY)
Troponin I (High Sensitivity): 15 ng/L
Troponin I (High Sensitivity): 75 ng/L — ABNORMAL HIGH (ref <18)
Troponin I (High Sensitivity): 413 ng/L (ref <18)

## 2022-06-26 LAB — BRAIN NATRIURETIC PEPTIDE: B Natriuretic Peptide: 480.3 pg/mL — ABNORMAL HIGH (ref 0.0–100.0)

## 2022-06-26 LAB — SARS CORONAVIRUS 2 BY RT PCR: SARS Coronavirus 2 by RT PCR: NEGATIVE

## 2022-06-26 MED ORDER — METOPROLOL TARTRATE 25 MG PO TABS
12.5000 mg | ORAL_TABLET | Freq: Two times a day (BID) | ORAL | Status: DC
  Administered 2022-06-26 – 2022-06-27 (×3): 12.5 mg via ORAL
  Filled 2022-06-26 (×3): qty 1

## 2022-06-26 MED ORDER — ROSUVASTATIN CALCIUM 5 MG PO TABS
5.0000 mg | ORAL_TABLET | Freq: Every day | ORAL | Status: DC
  Administered 2022-06-27 – 2022-06-28 (×2): 5 mg via ORAL
  Filled 2022-06-26 (×2): qty 1

## 2022-06-26 MED ORDER — ALBUTEROL SULFATE HFA 108 (90 BASE) MCG/ACT IN AERS: 2.0000 | INHALATION_SPRAY | RESPIRATORY_TRACT | Status: DC | PRN

## 2022-06-26 MED ORDER — FUROSEMIDE 10 MG/ML IJ SOLN
40.0000 mg | Freq: Two times a day (BID) | INTRAMUSCULAR | Status: DC
  Administered 2022-06-26 – 2022-06-28 (×4): 40 mg via INTRAVENOUS
  Filled 2022-06-26 (×4): qty 4

## 2022-06-26 MED ORDER — BENZOCAINE-MENTHOL 20-0.5 % EX AERO
1.0000 | INHALATION_SPRAY | Freq: Four times a day (QID) | CUTANEOUS | Status: DC | PRN
  Filled 2022-06-26: qty 56

## 2022-06-26 MED ORDER — ASPIRIN 81 MG PO TBEC
81.0000 mg | DELAYED_RELEASE_TABLET | Freq: Every day | ORAL | Status: DC
  Administered 2022-06-26 – 2022-06-28 (×3): 81 mg via ORAL
  Filled 2022-06-26 (×3): qty 1

## 2022-06-26 MED ORDER — FERROUS SULFATE 325 (65 FE) MG PO TABS
325.0000 mg | ORAL_TABLET | Freq: Every day | ORAL | Status: DC
  Administered 2022-06-27: 325 mg via ORAL
  Filled 2022-06-26: qty 1

## 2022-06-26 MED ORDER — ADULT MULTIVITAMIN W/MINERALS CH
1.0000 | ORAL_TABLET | Freq: Every day | ORAL | Status: DC
  Administered 2022-06-27 – 2022-06-28 (×2): 1 via ORAL
  Filled 2022-06-26 (×2): qty 1

## 2022-06-26 MED ORDER — TRAZODONE HCL 50 MG PO TABS
50.0000 mg | ORAL_TABLET | Freq: Every day | ORAL | Status: DC
  Administered 2022-06-26 – 2022-06-27 (×2): 50 mg via ORAL
  Filled 2022-06-26 (×3): qty 1

## 2022-06-26 MED ORDER — MELATONIN 5 MG PO TABS
10.0000 mg | ORAL_TABLET | Freq: Every day | ORAL | Status: DC
  Filled 2022-06-26 (×3): qty 2

## 2022-06-26 MED ORDER — AMLODIPINE BESYLATE 10 MG PO TABS
10.0000 mg | ORAL_TABLET | Freq: Every day | ORAL | Status: DC
  Administered 2022-06-26 – 2022-06-28 (×3): 10 mg via ORAL
  Filled 2022-06-26: qty 2
  Filled 2022-06-26 (×2): qty 1

## 2022-06-26 MED ORDER — SODIUM CHLORIDE 0.9 % IV SOLN: 10.0000 mL/h | Freq: Once | INTRAVENOUS | Status: DC

## 2022-06-26 MED ORDER — ACETAMINOPHEN 325 MG PO TABS
650.0000 mg | ORAL_TABLET | Freq: Four times a day (QID) | ORAL | Status: DC | PRN
  Administered 2022-06-27 (×2): 650 mg via ORAL
  Filled 2022-06-26 (×2): qty 2

## 2022-06-26 MED ORDER — HYDRALAZINE HCL 20 MG/ML IJ SOLN: 5.0000 mg | INTRAMUSCULAR | Status: DC | PRN

## 2022-06-26 MED ORDER — BENZOCAINE-MENTHOL 20-0.5 % EX AERO: 1.0000 | INHALATION_SPRAY | Freq: Four times a day (QID) | CUTANEOUS | Status: DC | PRN

## 2022-06-26 MED ORDER — DM-GUAIFENESIN ER 30-600 MG PO TB12: 1.0000 | ORAL_TABLET | Freq: Two times a day (BID) | ORAL | Status: DC | PRN

## 2022-06-26 MED ORDER — INSULIN ASPART 100 UNIT/ML IJ SOLN: 0.0000 [IU] | Freq: Every day | INTRAMUSCULAR | Status: DC

## 2022-06-26 MED ORDER — PANTOPRAZOLE SODIUM 40 MG PO TBEC
40.0000 mg | DELAYED_RELEASE_TABLET | Freq: Two times a day (BID) | ORAL | Status: DC
  Administered 2022-06-26 – 2022-06-28 (×5): 40 mg via ORAL
  Filled 2022-06-26 (×5): qty 1

## 2022-06-26 MED ORDER — PRAMIPEXOLE DIHYDROCHLORIDE 0.25 MG PO TABS
0.2500 mg | ORAL_TABLET | Freq: Two times a day (BID) | ORAL | Status: DC
  Administered 2022-06-27 – 2022-06-28 (×3): 0.25 mg via ORAL
  Filled 2022-06-26 (×5): qty 1

## 2022-06-26 MED ORDER — ONDANSETRON HCL 4 MG/2ML IJ SOLN: 4.0000 mg | Freq: Three times a day (TID) | INTRAMUSCULAR | Status: DC | PRN

## 2022-06-26 MED ORDER — NITROGLYCERIN 0.4 MG SL SUBL: 0.4000 mg | SUBLINGUAL_TABLET | SUBLINGUAL | Status: DC | PRN

## 2022-06-26 MED ORDER — FUROSEMIDE 10 MG/ML IJ SOLN
40.0000 mg | Freq: Once | INTRAMUSCULAR | Status: AC
  Administered 2022-06-26: 40 mg via INTRAVENOUS
  Filled 2022-06-26: qty 4

## 2022-06-26 MED ORDER — ISOSORBIDE MONONITRATE ER 30 MG PO TB24
30.0000 mg | ORAL_TABLET | Freq: Every day | ORAL | Status: DC
  Administered 2022-06-26 – 2022-06-27 (×2): 30 mg via ORAL
  Filled 2022-06-26 (×2): qty 1

## 2022-06-26 MED ORDER — GABAPENTIN 100 MG PO CAPS
100.0000 mg | ORAL_CAPSULE | Freq: Every day | ORAL | Status: DC
  Administered 2022-06-26 – 2022-06-27 (×2): 100 mg via ORAL
  Filled 2022-06-26 (×2): qty 1

## 2022-06-26 MED ORDER — INSULIN ASPART 100 UNIT/ML IJ SOLN
0.0000 [IU] | Freq: Three times a day (TID) | INTRAMUSCULAR | Status: DC
  Administered 2022-06-26: 3 [IU] via SUBCUTANEOUS
  Administered 2022-06-26: 5 [IU] via SUBCUTANEOUS
  Administered 2022-06-27: 2 [IU] via SUBCUTANEOUS
  Administered 2022-06-27: 1 [IU] via SUBCUTANEOUS
  Administered 2022-06-27: 2 [IU] via SUBCUTANEOUS
  Filled 2022-06-26 (×5): qty 1

## 2022-06-26 NOTE — ED Notes (Signed)
Pt assisted to restroom, declined to use bed pan or purewick, labored breathing noted, desat to 83%.

## 2022-06-26 NOTE — ED Notes (Signed)
MD Blaine Hamper notified of critical troponin 413.

## 2022-06-26 NOTE — ED Notes (Signed)
This RN assisted the pt to stand at bedside to change bed linen which was saturated in urine. While standing the pt had incontinence of urine. Pt is currently in bed and clean with new purewick

## 2022-06-26 NOTE — H&P (Signed)
History and Physical    Lisa Mcneil PPJ:[id number] DOB: [birth year] DOA: 06/26/2022  Referring MD/NP/PA:   PCP: Dion Body, MD   Patient coming from:  The patient is coming from home.   Chief Complaint: SOB  HPI: Lisa Mcneil is a 88 y.o. female with medical history significant of dCHF, HTN, HLD, DM, CAD, NSTEMI, CKD-3b, GERD, RLS, iron deficiency anemia, who presents with shortness of breath.  Patient was recently hospitalized from 11/3 - 11/5 due to NSTEMI, dCHF exacerbation and worsening anemia.  Patient received 1 unit of blood transfusion.  Hemoglobin increased from 7.2 to 9.8 on 11/5.  Patient states she started having shortness breath in the early morning, which has been progressively worsening.  Patient has mild dry cough, no fever or chills.  She reports that she had chest pain initially, which has resolved.  Her chest pain was located in the substernal area, pressure-like, mild to moderate, not radiating.  Currently denies any chest pain.  Patient does not have nausea, vomiting or abdominal pain.  Patient states that her stool has always been dark since she started taking iron supplement.  She did not notice any change in her stool.  Patient had oxygen desaturation to 76% initially, initially started on 5 L oxygen, then improved to 95% on 3.5 L oxygen.  Patient does not have respiratory distress when I saw patient in ED.  Data reviewed independently and ED Course: pt was found to have hemoglobin 6.9 (9.8 on 06/15/2022), WBC 7.9, BNP 480, troponin level 15, stable renal function, temperature 99.2, blood pressure 144/50, heart rate 90, RR 28.  X-ray showed cardiomegaly and interstitial pulmonary edema with bilateral small pleural effusion.  Patient is admitted to PCU as inpatient  EKG: I have personally reviewed.  Sinus rhythm, QTc 453, LAE, ST depression in inferior leads and V3-V6 which has no significant change from previous EKG.     Review of Systems:    General: no fevers, chills, no body weight gain, has fatigue HEENT: no blurry vision, hearing changes or sore throat Respiratory: has dyspnea, coughing, no wheezing CV: had chest pain, no palpitations GI: no nausea, vomiting, abdominal pain, diarrhea, constipation GU: no dysuria, burning on urination, increased urinary frequency, hematuria  Ext: has trace leg edema Neuro: no unilateral weakness, numbness, or tingling, no vision change or hearing loss Skin: no rash, no skin tear. MSK: No muscle spasm, no deformity, no limitation of range of movement in spin Heme: No easy bruising.  Travel history: No recent long distant travel.   Allergy:  Allergies  Allergen Reactions   Atorvastatin Other (See Comments)   Codeine    Demerol [Meperidine]    Polysaccharide Iron Complex Other (See Comments)    Caused constipation   Naproxen Rash    Caused rash on mouth    Past Medical History:  Diagnosis Date   CHF (congestive heart failure) (HCC)    Diabetes mellitus without complication (HCC)    GERD (gastroesophageal reflux disease)    Hypertension    Insomnia    Iron deficiency anemia    RLS (restless legs syndrome)     Past Surgical History:  Procedure Laterality Date   BLADDER REPAIR     CATARACT EXTRACTION W/PHACO Right 05/13/2016   Procedure: CATARACT EXTRACTION PHACO AND INTRAOCULAR LENS PLACEMENT ([place]);  Surgeon: Birder Robson, MD;  Location: [facility] ORS;  Service: Ophthalmology;  Laterality: Right;  [place] 1.09 AP% 21.0 CDE 14.60 Fluid Pack Lot # [id number]   CATARACT EXTRACTION W/PHACO Left 07/15/2016  Procedure: CATARACT EXTRACTION PHACO AND INTRAOCULAR LENS PLACEMENT ([facility]);  Surgeon: Birder Robson, MD;  Location: [facility] ORS;  Service: Ophthalmology;  Laterality: Left;  Lot# [id number] H [place]: 01:17.9 AP%:27.8 CDE: 21.67     Social History:  reports that she has quit smoking. She has never used smokeless tobacco. She reports that she does not drink alcohol and does not use  drugs.  Family History: I have tried to review with patient about family medical history, patient states that she does not know the details family medical history.    Prior to Admission medications   Medication Sig Start Date End Date Taking? Authorizing Provider  amLODipine (NORVASC) 10 MG tablet Take 10 mg by mouth daily.    [provider]  aspirin EC 81 MG tablet Take 1 tablet (81 mg total) by mouth daily. Swallow whole. 06/15/22   Lorella Nimrod, MD  Calcium Carbonate-Vit D-Min (CALTRATE 600+D PLUS MINERALS) 600-800 MG-UNIT CHEW Chew 1 tablet by mouth as directed.    [provider]  clopidogrel (PLAVIX) 75 MG tablet Take 1 tablet (75 mg total) by mouth daily. 06/16/22   Lorella Nimrod, MD  estradiol (ESTRACE) 0.1 MG/GM vaginal cream SMARTSIG:Gram(s) Vaginal Twice a Week 05/09/22   [provider]  ferrous sulfate 325 (65 FE) MG tablet Take 325 mg by mouth daily.    [provider]  furosemide (LASIX) 20 MG tablet Take 1 tablet (20 mg total) by mouth daily as needed for fluid or edema. 06/15/22 06/15/23  Lorella Nimrod, MD  gabapentin (NEURONTIN) 100 MG capsule Take 100 mg by mouth at bedtime.    [provider]  isosorbide mononitrate (IMDUR) 30 MG 24 hr tablet Take 1 tablet (30 mg total) by mouth daily. 06/16/22   Lorella Nimrod, MD  lisinopril-hydrochlorothiazide (ZESTORETIC) 20-12.5 MG tablet Take 1 tablet by mouth daily. Hold until you sees your doctor. 06/15/22   Lorella Nimrod, MD  metFORMIN (GLUCOPHAGE) 500 MG tablet Take 500 mg by mouth 2 (two) times daily with a meal.    [provider]  metoprolol tartrate (LOPRESSOR) 25 MG tablet Take 0.5 tablets (12.5 mg total) by mouth 2 (two) times daily. 06/15/22   Lorella Nimrod, MD  Multiple Vitamins-Minerals (MULTIVITAMIN WITH MINERALS) tablet Take 1 tablet by mouth daily.    [provider]  nitroGLYCERIN (NITROSTAT) 0.4 MG SL tablet Place 1 tablet (0.4 mg total) under the tongue every 5  (five) minutes as needed for chest pain. 06/15/22   Lorella Nimrod, MD  omeprazole (PRILOSEC) 20 MG capsule Take 20 mg by mouth 2 (two) times daily.    [provider]  rosuvastatin (CRESTOR) 5 MG tablet Take 5 mg by mouth at bedtime.    [provider]  traZODone (DESYREL) 50 MG tablet Take 50 mg by mouth at bedtime.    [provider]    Physical Exam: Vitals:   06/26/22 0930 06/26/22 1400 06/26/22 1404 06/26/22 1420  BP: (!) 144/50  (!) 163/58 (!) 165/57  Pulse: 71 74 76 80  Resp: 19 (!) 24 14 19   Temp:   (!) 97.5 F (36.4 C) 98.3 F (36.8 C)  TempSrc:   Oral Oral  SpO2: 95% 99% 97% 97%  Height:       General: Not in acute distress HEENT:       Eyes: PERRL, EOMI, no scleral icterus.       ENT: No discharge from the ears and nose, no pharynx injection, no tonsillar enlargement.  Neck: positive JVD, no bruit, no mass felt. Heme: No neck lymph node enlargement. Cardiac: V6/H6, RRR, 4/6 systolic murmurs, No gallops or rubs. Respiratory: No rales, wheezing, rhonchi or rubs. GI: Soft, nondistended, nontender, no rebound pain, no organomegaly, BS present. GU: No hematuria Ext: has trace leg edema bilaterally. 1+DP/PT pulse bilaterally. Musculoskeletal: No joint deformities, No joint redness or warmth, no limitation of ROM in spin. Skin: No rashes.  Neuro: Alert, oriented X3, cranial nerves II-XII grossly intact, moves all extremities normally.  Psych: Patient is not psychotic, no suicidal or hemocidal ideation.  Labs on Admission: I have personally reviewed following labs and imaging studies  CBC: Recent Labs  Lab 06/26/22 0829 06/26/22 1017  WBC 7.9 7.7  HGB 6.9* 7.0*  HCT 22.0* 22.4*  MCV 99.5 99.1  PLT 258 073   Basic Metabolic Panel: Recent Labs  Lab 06/26/22 0829  NA 136  K 4.1  CL 108  CO2 18*  GLUCOSE 180*  BUN 28*  CREATININE 1.77*  CALCIUM 9.1   GFR: Estimated Creatinine Clearance: 17.1 mL/min (A) (by C-G formula based  on SCr of 1.77 mg/dL (H)). Liver Function Tests: No results for input(s): "AST", "ALT", "ALKPHOS", "BILITOT", "PROT", "ALBUMIN" in the last 168 hours. No results for input(s): "LIPASE", "AMYLASE" in the last 168 hours. No results for input(s): "AMMONIA" in the last 168 hours. Coagulation Profile: No results for input(s): "INR", "PROTIME" in the last 168 hours. Cardiac Enzymes: No results for input(s): "CKTOTAL", "CKMB", "CKMBINDEX", "TROPONINI" in the last 168 hours. BNP (last 3 results) No results for input(s): "PROBNP" in the last 8760 hours. HbA1C: No results for input(s): "HGBA1C" in the last 72 hours. CBG: Recent Labs  Lab 06/26/22 1134  GLUCAP 214*   Lipid Profile: No results for input(s): "CHOL", "HDL", "LDLCALC", "TRIG", "CHOLHDL", "LDLDIRECT" in the last 72 hours. Thyroid Function Tests: No results for input(s): "TSH", "T4TOTAL", "FREET4", "T3FREE", "THYROIDAB" in the last 72 hours. Anemia Panel: Recent Labs    06/26/22 1017 06/26/22 1129  FOLATE 20.9  --   FERRITIN 216  --   TIBC 329  --   IRON 137  --   RETICCTPCT  --  9.0*   Urine analysis:    Component Value Date/Time   COLORURINE STRAW (A) 06/13/2022 1425   APPEARANCEUR CLEAR (A) 06/13/2022 1425   APPEARANCEUR Cloudy 06/23/2014 2320   LABSPEC 1.010 06/13/2022 1425   LABSPEC 1.015 06/23/2014 2320   PHURINE 5.0 06/13/2022 1425   GLUCOSEU NEGATIVE 06/13/2022 1425   GLUCOSEU Negative 06/23/2014 2320   HGBUR NEGATIVE 06/13/2022 1425   BILIRUBINUR NEGATIVE 06/13/2022 1425   BILIRUBINUR Negative 06/23/2014 2320   KETONESUR NEGATIVE 06/13/2022 1425   PROTEINUR NEGATIVE 06/13/2022 1425   NITRITE NEGATIVE 06/13/2022 1425   LEUKOCYTESUR SMALL (A) 06/13/2022 1425   LEUKOCYTESUR 3+ 06/23/2014 2320   Sepsis Labs: @LABRCNTIP (procalcitonin:4,lacticidven:4) )No results found for this or any previous visit (from the past 240 hour(s)).   Radiological Exams on Admission: DG Chest Port 1 View  Result Date:  06/26/2022 CLINICAL DATA:  Shortness of breath EXAM: PORTABLE CHEST 1 VIEW COMPARISON:  Previous studies including the examination of 06/13/2022 FINDINGS: Transverse diameter of heart is increased. Central pulmonary vessels are prominent. There is diffuse increase in interstitial markings in both lungs, more so in the lower lung fields with interval worsening. There is no focal pulmonary consolidation. Small bilateral pleural effusions are seen. IMPRESSION: Cardiomegaly. There is interval worsening of signs of pulmonary edema. Small bilateral pleural effusions, more so on  the right side. Electronically Signed   By: Elmer Picker M.D.   On: 06/26/2022 09:00      Assessment/Plan Principal Problem:   Acute on chronic diastolic CHF (congestive heart failure) (HCC) Active Problems:   CAD (coronary artery disease)   Myocardial injury   Symptomatic anemia   Iron deficiency anemia   Essential hypertension   Type II diabetes mellitus with renal manifestations (HCC)   Chronic kidney disease, stage 3b (HCC)   HLD (hyperlipidemia)   GERD without esophagitis   Assessment and Plan:  Acute on chronic diastolic CHF (congestive heart failure) ([place]): 2D echo on 06/15/2022 showed EF of 60 to 65% with grade 1 diastolic dysfunction.  BNP is elevated at 480.  -Will admit to progressive unit as inpatient -Lasix 40 mg bid by IV -Daily weights -strict I/O's -Low salt diet -Fluid restriction -Obtain REDs Vest reading -consulted Dr. Estrella Myrtle of card  CAD (coronary artery disease) and myocardial injury: trpp 15 --> 75 --> 413. Pt recently had elevated troponin up to 741. -Trend troponin -Check A1c, FLP -Hold Plavix due to worsening anemia -Continue aspirin 81 mg daily -Crestor -Will not start IV heparin due to worsening anemia  Symptomatic anemia and iron deficiency anemia:  Hgb 9.8 --> 6.9 --> 7.0. -check anemia panel -Transfuse 1 unit of blood -Continue iron supplement  Essential  hypertension -Continue amlodipine, Imdur, metoprolol -hold Zestoretic since patient is on IV Lasix  Type II diabetes mellitus with renal manifestations ([place]): Recent A1c 5.6, well controlled.  Patient is taking metformin at home -Sliding scale insulin  Chronic kidney disease, stage 3b ([place]): Stable -Follow-up with BMI of  HLD (hyperlipidemia) -Crestor  GERD without esophagitis -Protonix       DVT ppx: SCD  Code Status: DNR per pt and her son  Family Communication:   Yes, patient's son   at bed side.       Disposition Plan:  Anticipate discharge back to previous environment  Consults called:  Dr. Saralyn Pilar of card  Admission status and Level of care: Progressive: as inpt        Dispo: The patient is from: Home              Anticipated d/c is to: Home              Anticipated d/c date is: 2 days              Patient currently is not medically stable to d/c.    Severity of Illness:  The appropriate patient status for this patient is INPATIENT. Inpatient status is judged to be reasonable and necessary in order to provide the required intensity of service to ensure the patient's safety. The patient's presenting symptoms, physical exam findings, and initial radiographic and laboratory data in the context of their chronic comorbidities is felt to place them at high risk for further clinical deterioration. Furthermore, it is not anticipated that the patient will be medically stable for discharge from the hospital within 2 midnights of admission.   * I certify that at the point of admission it is my clinical judgment that the patient will require inpatient hospital care spanning beyond 2 midnights from the point of admission due to high intensity of service, high risk for further deterioration and high frequency of surveillance required.*       Date of Service 06/26/2022    [facility]   If 7PM-7AM, please contact  night-coverage [email or web address] 06/26/2022, 2:44 PM

## 2022-06-26 NOTE — ED Provider Notes (Signed)
[facility] Provider Note    Event Date/Time   First MD Initiated Contact with Patient 06/26/22 [phone]     (approximate)   History   Chief Complaint: No chief complaint on file.   HPI  Lisa Mcneil is a 88 y.o. female with a history of hypertension diabetes GERD CKD CHF who comes ED complaining of shortness of breath and centralized chest pressure that started this morning at 6:00 AM, constant, no aggravating or alleviating factors.  Chest pressure is nonradiating.  No vomiting or diaphoresis.  Patient found to have an oxygen saturation of 76% on room air.  She does not use oxygen at home.  She tried nitroglycerin x2 prior to arrival in the ED without improvement.  Placing the patient on 5 L nasal cannula brings oxygen saturation up to 89% and nearly resolves her symptoms.     Physical Exam   Triage Vital Signs: ED Triage Vitals  Enc Vitals Group     BP 06/26/22 0826 (!) 145/42     Pulse Rate 06/26/22 0826 90     Resp 06/26/22 0826 (!) 28     Temp 06/26/22 0832 99.2 F (37.3 C)     Temp Source 06/26/22 0832 Oral     SpO2 06/26/22 0826 (!) 76 %     Weight --      Height 06/26/22 0826 5\' 3"  (1.6 m)     Head Circumference --      Peak Flow --      Pain Score --      Pain Loc --      Pain Edu? --      Excl. in [place]? --     Most recent vital signs: Vitals:   06/26/22 0900 06/26/22 0930  BP: (!) 133/46 (!) 144/50  Pulse: 73 71  Resp: 19 19  Temp:    SpO2: 93% 95%    General: Awake, no distress.  CV:  Good peripheral perfusion.  Regular rate and rhythm.  Normal distal pulses Resp:  Normal effort.  Prominent crackles bilateral lower lung fields. Abd:  No distention.  Soft nontender Other:  No appreciable lower extremity edema or calf tenderness   ED Results / Procedures / Treatments   Labs (all labs ordered are listed, but only abnormal results are displayed) Labs Reviewed  CBC - Abnormal; Notable for the following components:       Result Value   RBC 2.21 (*)    Hemoglobin 6.9 (*)    HCT 22.0 (*)    RDW 17.7 (*)    nRBC 0.4 (*)    All other components within normal limits  BASIC METABOLIC PANEL - Abnormal; Notable for the following components:   CO2 18 (*)    Glucose, Bld 180 (*)    BUN 28 (*)    Creatinine, Ser 1.77 (*)    GFR, Estimated 27 (*)    All other components within normal limits  BRAIN NATRIURETIC PEPTIDE - Abnormal; Notable for the following components:   B Natriuretic Peptide 480.3 (*)    All other components within normal limits  PREPARE RBC (CROSSMATCH)  TYPE AND SCREEN  TROPONIN I (HIGH SENSITIVITY)     EKG Interpreted by me Sinus rhythm rate of 86.  Normal axis and intervals.  Normal QRS ST segments and T waves.   RADIOLOGY Chest x-ray interpreted by me, shows bilateral pulmonary edema.  Radiology report reviewed   PROCEDURES:  .Critical Care  Performed by: Carrie Mew,  MD Authorized by: Carrie Mew, MD   Critical care provider statement:    Critical care time (minutes):  35   Critical care time was exclusive of:  Separately billable procedures and treating other patients   Critical care was necessary to treat or prevent imminent or life-threatening deterioration of the following conditions:  Respiratory failure and cardiac failure   Critical care was time spent personally by me on the following activities:  Development of treatment plan with patient or surrogate, discussions with consultants, evaluation of patient's response to treatment, examination of patient, obtaining history from patient or surrogate, ordering and performing treatments and interventions, ordering and review of laboratory studies, ordering and review of radiographic studies, pulse oximetry, re-evaluation of patient's condition and review of old charts   Care discussed with: admitting provider   Comments:           MEDICATIONS ORDERED IN ED: Medications  0.9 %  sodium chloride infusion (has  no administration in time range)  furosemide (LASIX) injection 40 mg (40 mg Intravenous Given 06/26/22 0943)     IMPRESSION / MDM / [place] / ED COURSE  I reviewed the triage vital signs and the nursing notes.                              Differential diagnosis includes, but is not limited to, pulmonary edema/CHF exacerbation, pleural effusion, pneumonia, symptomatic anemia, electrolyte abnormality, non-STEMI  Patient's presentation is most consistent with acute presentation with potential threat to life or bodily function.  Patient presents with shortness of breath and hypoxia with a room air oxygen saturation of 76%.  No previous oxygen requirement.  Stabilized on 5 or 6 L nasal cannula.  Work of breathing is okay, not in distress.  Chest x-ray consistent with pulmonary edema.  Labs show hemoglobin of 6.9 which I think is partially delusional from volume overload, and partially due to her chronic anemia.  I think this is also worsening her symptoms and her ability to diurese due to low oncotic pressure, so we will transfuse 1 unit of red cells.  Albumin level was normal 2 weeks ago at last lab check.  Patient given IV Lasix to initiate diuresis in the ED.  Creatinine is at chronic baseline.  We will need to admit for hypoxic respiratory failure and CHF exacerbation.        FINAL CLINICAL IMPRESSION(S) / ED DIAGNOSES   Final diagnoses:  Acute respiratory failure with hypoxia (HCC)  Acute on chronic congestive heart failure, unspecified heart failure type ([place])  Symptomatic anemia     Rx / DC Orders   ED Discharge Orders     None        Note:  This document was prepared using Dragon voice recognition software and may include unintentional dictation errors.   Carrie Mew, MD 06/26/22 1002

## 2022-06-26 NOTE — ED Notes (Signed)
Pt given orange juice, ok per Dr Joni Fears.

## 2022-06-26 NOTE — ED Notes (Signed)
Pt placed on purewick 

## 2022-06-26 NOTE — ED Triage Notes (Signed)
Pt to ED [facility] from home for shob and centralized chest pressure that started this am. Recently diagnosed with CHF. Pt with RA spo2 76% on arrival. Placed on 5 L [place] with improvement to 89%. Took nitro x2 with no relief.

## 2022-06-26 NOTE — Consult Note (Signed)
Lisa Mcneil NOTE       Patient ID: Lisa Mcneil MRN: [id number] DOB/AGE: May 26, 1928 88 y.o.  Admit date: 06/26/2022 Referring Physician Dr. Blaine Hamper Primary Physician Dr. Netty Starring Primary Cardiologist Dr. Clayborn Bigness Reason for Consultation AoCHF  HPI: Lisa Mcneil is a 88yoF with a PMH of HFpEF (EF 60-65%, mild LVH, g1dd 06/15/22), mod-severe AS (peak grad 42.9 mmHg), DM2, HTN, IDA HLD, CKD 3 who presented to [facility] ED 06/26/2022 with shortness of breath and generalized central chest pressure.  She was hypoxic to 76% on room air in the emergency department she was also anemic with an Hgb of 6.9.  Cardiology is consulted for further assistance with her heart failure.  She presents with her son who contributes to the history.  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  She states she woke up this morning with 10/10 central substernal chest pain not relieved by sublingual nitroglycerin x2.  She said the pain was constant, unrelenting, but did not radiate, was not associated with nausea, vomiting, diaphoresis, or shortness of breath.  The pain went away once EMTs arrived and put her on supplemental oxygen.  Notably, she says this pain was different than the right-sided discomfort she had earlier this month when she presented to [facility] and was treated medically for an NSTEMI and a heart failure exacerbation.  She denies any significant change in her weight, eating salty foods or anything unusual for her, or drinking more fluid than usual.  She denies sick contacts, no fever or chills.  She has not noticed any peripheral edema, palpitations, heart racing.  Her chest pain is not reproducible on exam.  When the EMTs came to her home, she was hypoxic to 76% on room air.  She is stable on 4 L of oxygen at my time of evaluation.  She was given 40 mg of IV Lasix and has been urinating well.  At home, she has not taken any of her as  needed Lasix because she had not really noticed any weight gain or leg swelling.  She takes iron supplements so her stools are typically dark.   Review of systems complete and found to be negative unless listed above     Past Medical History:  Diagnosis Date   CHF (congestive heart failure) (HCC)    Diabetes mellitus without complication (HCC)    GERD (gastroesophageal reflux disease)    Hypertension    Insomnia    Iron deficiency anemia    RLS (restless legs syndrome)     Past Surgical History:  Procedure Laterality Date   BLADDER REPAIR     CATARACT EXTRACTION W/PHACO Right 05/13/2016   Procedure: CATARACT EXTRACTION PHACO AND INTRAOCULAR LENS PLACEMENT ([place]);  Surgeon: Birder Robson, MD;  Location: [facility] ORS;  Service: Ophthalmology;  Laterality: Right;  [place] 1.09 AP% 21.0 CDE 14.60 Fluid Pack Lot # [id number]   CATARACT EXTRACTION W/PHACO Left 07/15/2016   Procedure: CATARACT EXTRACTION PHACO AND INTRAOCULAR LENS PLACEMENT ([facility]);  Surgeon: Birder Robson, MD;  Location: [facility] ORS;  Service: Ophthalmology;  Laterality: Left;  Lot# [id number] H [place]: 01:17.9 AP%:27.8 CDE: 21.67     (Not in a hospital admission)  Social History   Socioeconomic History   Marital status: Single    Spouse name: Not on file   Number of children: Not on file   Years of education: Not on file   Highest education level: Not on file  Occupational History  Not on file  Tobacco Use   Smoking status: Former   Smokeless tobacco: Never  Vaping Use   Vaping Use: Never used  Substance and Sexual Activity   Alcohol use: No   Drug use: No   Sexual activity: Not on file  Other Topics Concern   Not on file  Social History Narrative   Not on file   Social Determinants of Health   Financial Resource Strain: Not on file  Food Insecurity: Not on file  Transportation Needs: Not on file  Physical Activity: Not on file  Stress: Not on file  Social Connections: Not on file  Intimate Partner Violence:  Not on file    No family history on file.   Vitals:   06/26/22 0930 06/26/22 1400 06/26/22 1404 06/26/22 1420  BP: (!) 144/50  (!) 163/58 (!) 165/57  Pulse: 71 74 76 80  Resp: 19 (!) 24 14 19   Temp:   (!) 97.5 F (36.4 C) 98.3 F (36.8 C)  TempSrc:   Oral Oral  SpO2: 95% 99% 97% 97%  Height:        PHYSICAL EXAM General: Pleasant conversational elderly Caucasian female, well nourished, in no acute distress.  Sitting upright in hospital bed, son at bedside HEENT:  Normocephalic and atraumatic. Neck:  No JVD.  Lungs: Normal respiratory effort on 4L [place]. Bibasilar crackles.  Heart: HRRR . Normal S1 and S2 without gallops or murmurs.  Abdomen: Non-distended appearing.  Msk: Normal strength and tone for age. Extremities: Warm and well perfused. No clubbing, cyanosis. Nosignificant peripheral edema.  Neuro: Alert and oriented X 3. Psych:  Answers questions appropriately.   Labs: Basic Metabolic Panel: Recent Labs    06/26/22 0829  NA 136  K 4.1  CL 108  CO2 18*  GLUCOSE 180*  BUN 28*  CREATININE 1.77*  CALCIUM 9.1   Liver Function Tests: No results for input(s): "AST", "ALT", "ALKPHOS", "BILITOT", "PROT", "ALBUMIN" in the last 72 hours. No results for input(s): "LIPASE", "AMYLASE" in the last 72 hours. CBC: Recent Labs    06/26/22 0829 06/26/22 1017  WBC 7.9 7.7  HGB 6.9* 7.0*  HCT 22.0* 22.4*  MCV 99.5 99.1  PLT 258 267   Cardiac Enzymes: Recent Labs    06/26/22 0829 06/26/22 1017  TROPONINIHS 15 75*   BNP: Recent Labs    06/26/22 0836  BNP 480.3*   D-Dimer: No results for input(s): "DDIMER" in the last 72 hours. Hemoglobin A1C: No results for input(s): "HGBA1C" in the last 72 hours. Fasting Lipid Panel: No results for input(s): "CHOL", "HDL", "LDLCALC", "TRIG", "CHOLHDL", "LDLDIRECT" in the last 72 hours. Thyroid Function Tests: No results for input(s): "TSH", "T4TOTAL", "T3FREE", "THYROIDAB" in the last 72 hours.  Invalid input(s):  "FREET3" Anemia Panel: Recent Labs    06/26/22 1017 06/26/22 1129  FOLATE 20.9  --   FERRITIN 216  --   TIBC 329  --   IRON 137  --   RETICCTPCT  --  9.0*     Radiology: DG Chest Port 1 View  Result Date: 06/26/2022 CLINICAL DATA:  Shortness of breath EXAM: PORTABLE CHEST 1 VIEW COMPARISON:  Previous studies including the examination of 06/13/2022 FINDINGS: Transverse diameter of heart is increased. Central pulmonary vessels are prominent. There is diffuse increase in interstitial markings in both lungs, more so in the lower lung fields with interval worsening. There is no focal pulmonary consolidation. Small bilateral pleural effusions are seen. IMPRESSION: Cardiomegaly. There is interval worsening of  signs of pulmonary edema. Small bilateral pleural effusions, more so on the right side. Electronically Signed   By: Elmer Picker M.D.   On: 06/26/2022 09:00   ECHOCARDIOGRAM COMPLETE  Result Date: 06/15/2022    ECHOCARDIOGRAM REPORT   Patient Name:   Lisa Mcneil Date of Exam: 06/15/2022 Medical Rec #:  [id number]          Height:       63.0 in Accession #:    [id number]         Weight:       123.9 lb Date of Birth:  [birth year]         BSA:          1.578 m Patient Age:    51 years           BP:           131/47 mmHg Patient Gender: F                  HR:           68 bpm. Exam Location:  [facility] Procedure: 2D Echo, Cardiac Doppler and Color Doppler Indications:    CHF- Acute Diastolic N39.76  History:        Patient has prior history of Echocardiogram examinations, most                 recent 11/27/2020.  Sonographer:    Ronny Flurry Referring Phys: [id number] [place]  1. Left ventricular ejection fraction, by estimation, is 60 to 65%. The left ventricle has normal function. The left ventricle has no regional wall motion abnormalities. There is mild left ventricular hypertrophy. Left ventricular diastolic parameters are consistent with Grade I diastolic dysfunction  (impaired relaxation).  2. Right ventricular systolic function is normal. The right ventricular size is normal.  3. The mitral valve is normal in structure. Mild to moderate mitral valve regurgitation. Moderate to severe mitral annular calcification.  4. Mild tricuspid stenosis.  5. The aortic valve is calcified. There is moderate calcification of the aortic valve. There is moderate thickening of the aortic valve. Aortic valve regurgitation is not visualized. Moderate to severe aortic valve stenosis. FINDINGS  Left Ventricle: Left ventricular ejection fraction, by estimation, is 60 to 65%. The left ventricle has normal function. The left ventricle has no regional wall motion abnormalities. The left ventricular internal cavity size was normal in size. There is  mild left ventricular hypertrophy. Left ventricular diastolic parameters are consistent with Grade I diastolic dysfunction (impaired relaxation). Right Ventricle: The right ventricular size is normal. No increase in right ventricular wall thickness. Right ventricular systolic function is normal. Left Atrium: Left atrial size was normal in size. Right Atrium: Right atrial size was normal in size. Pericardium: There is no evidence of pericardial effusion. Mitral Valve: The mitral valve is normal in structure. There is moderate thickening of the mitral valve leaflet(s). There is mild calcification of the mitral valve leaflet(s). Normal mobility of the mitral valve leaflets. Moderate to severe mitral annular calcification. Mild to moderate mitral valve regurgitation. Tricuspid Valve: The tricuspid valve is normal in structure. Tricuspid valve regurgitation is not demonstrated. Mild tricuspid stenosis. Aortic Valve: The aortic valve is calcified. There is moderate calcification of the aortic valve. There is moderate thickening of the aortic valve. There is moderate aortic valve annular calcification. Aortic valve regurgitation is not visualized. Moderate to severe  aortic stenosis is present. Aortic valve mean gradient measures 27.0 mmHg. Aortic valve  peak gradient measures 42.9 mmHg. Aortic valve area, by VTI measures 0.89 cm. Pulmonic Valve: The pulmonic valve was normal in structure. Pulmonic valve regurgitation is not visualized. Aorta: The ascending aorta was not well visualized. IAS/Shunts: No atrial level shunt detected by color flow Doppler.  LEFT VENTRICLE PLAX 2D LVIDd:         4.10 cm      Diastology LVIDs:         2.70 cm      LV e' medial:    5.33 cm/s LV PW:         1.30 cm      LV E/e' medial:  21.8 LV IVS:        1.20 cm      LV e' lateral:   4.24 cm/s LVOT diam:     1.80 cm      LV E/e' lateral: 27.4 LV SV:         72 LV SV Index:   46 LVOT Area:     2.54 cm  LV Volumes (MOD) LV vol d, MOD A2C: 71.4 ml LV vol d, MOD A4C: 107.0 ml LV vol s, MOD A2C: 24.6 ml LV vol s, MOD A4C: 36.3 ml LV SV MOD A2C:     46.8 ml LV SV MOD A4C:     107.0 ml LV SV MOD BP:      56.6 ml RIGHT VENTRICLE             IVC RV S prime:     12.70 cm/s  IVC diam: 1.80 cm TAPSE (M-mode): 2.0 cm LEFT ATRIUM             Index        RIGHT ATRIUM           Index LA diam:        3.40 cm 2.16 cm/m   RA Area:     12.50 cm LA Vol (A2C):   31.2 ml 19.78 ml/m  RA Volume:   30.10 ml  19.08 ml/m LA Vol (A4C):   71.9 ml 45.58 ml/m LA Biplane Vol: 51.4 ml 32.58 ml/m  AORTIC VALVE AV Area (Vmax):    0.78 cm AV Area (Vmean):   0.80 cm AV Area (VTI):     0.89 cm AV Vmax:           327.50 cm/s AV Vmean:          247.000 cm/s AV VTI:            0.808 m AV Peak Grad:      42.9 mmHg AV Mean Grad:      27.0 mmHg LVOT Vmax:         101.00 cm/s LVOT Vmean:        77.300 cm/s LVOT VTI:          0.284 m LVOT/AV VTI ratio: 0.35  AORTA Ao Root diam: 3.10 cm Ao Asc diam:  3.10 cm MITRAL VALVE MV Area (PHT): 2.95 cm     SHUNTS MV Decel Time: 257 msec     Systemic VTI:  0.28 m MV E velocity: 116.00 cm/s  Systemic Diam: 1.80 cm MV A velocity: 142.00 cm/s MV E/A ratio:  0.82 Lisa D Callwood MD Electronically  signed by Lisa Kida MD Signature Date/Time: 06/15/2022/2:26:07 PM    Final    CT Angio Chest PE W and/or Wo Contrast  Result Date: 06/13/2022 CLINICAL DATA:  Pulmonary embolism (PE) suspected, high prob  EXAM: CT ANGIOGRAPHY CHEST WITH CONTRAST TECHNIQUE: Multidetector CT imaging of the chest was performed using the standard protocol during bolus administration of intravenous contrast. Multiplanar CT image reconstructions and MIPs were obtained to evaluate the vascular anatomy. RADIATION DOSE REDUCTION: This exam was performed according to the departmental dose-optimization program which includes automated exposure control, adjustment of the mA and/or kV according to patient size and/or use of iterative reconstruction technique. CONTRAST:  56mL OMNIPAQUE IOHEXOL 350 MG/ML SOLN COMPARISON:  CT a chest 11/27/2020. FINDINGS: Cardiovascular: Satisfactory opacification of the pulmonary arteries to the segmental level. No evidence of pulmonary embolism. Mild cardiomegaly.No pericardial disease. Coronary artery atherosclerosis. Moderate atherosclerosis of the thoracic aorta. Mediastinum/Nodes: Mildly prominent mediastinal lymph nodes, likely reactive. The thyroid is unremarkable. Esophagus is unremarkable. Lungs/Pleura: Moderate centrilobular and paraseptal emphysema. Mild diffuse bronchial wall thickening, lower lung predominant. There is diffuse interlobular septal thickening and ground-glass opacities bilaterally. There are more confluent airspace consolidations in the lung bases, likely atelectasis. There are trace pleural effusions, right greater than left. No evidence of pneumothorax. Upper Abdomen: No acute findings. Calcified granulomas in the spleen. Musculoskeletal: Unchanged mild chronic T2 superior endplate compression deformity. No acute osseous abnormality. No suspicious osseous lesion. Review of the MIP images confirms the above findings. IMPRESSION: Cardiomegaly with moderate pulmonary edema and  trace pleural effusions. Bibasilar airspace consolidations favored to be atelectasis. No evidence of pulmonary embolism. Emphysema. Electronically Signed   By: Lisa Mcneil M.D.   On: 06/13/2022 10:04   DG Chest Port 1 View  Result Date: 06/13/2022 CLINICAL DATA:  RIGHT-sided chest pain. EXAM: PORTABLE CHEST 1 VIEW COMPARISON:  Chest XR and CTA PE, 11/27/2020. FINDINGS: Cardiac silhouette is mildly enlarged. Aortic arch calcifications. Lungs are hypoinflated. Basilar and perihilar-predominant interstitial thickening. No discrete consolidation. Trivial RIGHT pleural effusion. No pneumothorax. No acute displaced fracture. IMPRESSION: 1. Cardiomegaly with mild interstitial pulmonary edema. 2.  Aortic Atherosclerosis (ICD10-I70.0). Electronically Signed   By: Michaelle Birks M.D.   On: 06/13/2022 07:49    ECHO 06/15/2022  1. Left ventricular ejection fraction, by estimation, is 60 to 65%. The  left ventricle has normal function. The left ventricle has no regional  wall motion abnormalities. There is mild left ventricular hypertrophy.  Left ventricular diastolic parameters  are consistent with Grade I diastolic dysfunction (impaired relaxation).   2. Right ventricular systolic function is normal. The right ventricular  size is normal.   3. The mitral valve is normal in structure. Mild to moderate mitral valve  regurgitation. Moderate to severe mitral annular calcification.   4. Mild tricuspid stenosis.   5. The aortic valve is calcified. There is moderate calcification of the  aortic valve. There is moderate thickening of the aortic valve. Aortic  valve regurgitation is not visualized. Moderate to severe aortic valve  stenosis.   TELEMETRY reviewed by me (LT) 06/26/2022 : None available for review  EKG reviewed by me: Sinus rhythm 86 LVH, nonspecific ST changes similar to last two prior EKGs.  Data reviewed by me (LT) 06/26/2022: ED note last cardiology note CBC BMP troponins vitals chest x-ray  BNP  Principal Problem:   Acute on chronic diastolic CHF (congestive heart failure) (HCC) Active Problems:   Iron deficiency anemia   Essential hypertension   GERD without esophagitis   HLD (hyperlipidemia)   Type II diabetes mellitus with renal manifestations (HCC)   Chronic kidney disease, stage 3b (HCC)   Symptomatic anemia   CAD (coronary artery disease)   Myocardial injury  ASSESSMENT AND PLAN:  Westyn Keatley is a 66yoF with a PMH of HFpEF (EF 60-65%, mild LVH, g1dd 06/15/22), mod-severe AS (peak grad 42.9 mmHg), DM2, HTN, IDA HLD, CKD 3 who presented to [facility] ED 06/26/2022 with shortness of breath and generalized central chest pressure.  She was hypoxic to 76% on room air in the emergency department she was also anemic with an Hgb of 6.9.  Cardiology is consulted for further assistance with her heart failure.  #Acute hypoxic respiratory failure #Acute on chronic HFpEF #Moderate to severe aortic stenosis BNP elevated at 480 on admission, she does not appear significantly volume overloaded on exam, but has bibasilar crackles with a 4 L oxygen requirement with not at baseline.  Unclear what led to her decompensation, denies dietary indiscretions.  She was not taking Lasix daily, as this was only ordered for as needed for weight gain. -Continue IV Lasix, metoprolol 12.5 mg twice daily, amlodipine -Strict I&O, daily weights  #Normocytic anemia Hemoglobin on admission 6.9, on 11/5 it was 9.8.  She is receiving a unit of PRBCs this afternoon during interview. -Management per primary team  #Elevated troponin #Atypical chest pain With recent hospitalization where troponin peaked at 700, in the setting of nonspecific chest discomfort and volume overload and anemia.  Troponins this admission have trended 15-75 with repeats pending.  Her chest pain was relieved with oxygen application, and not relieved with nitroglycerin.  This is most consistent with demand/supply mismatch and not ACS    This patient's plan of care was discussed and created with Dr. Saralyn Pilar and he is in agreement.  Signed: Tristan Mcneil , Lisa Mcneil 06/26/2022, 2:57 PM [facility] Cardiology

## 2022-06-26 NOTE — ED Notes (Signed)
Pt signed paper copy for blood consent, placed in chart.  Pt eating breakfast tray

## 2022-06-27 ENCOUNTER — Inpatient Hospital Stay: Payer: Medicare HMO

## 2022-06-27 DIAGNOSIS — D649 Anemia, unspecified: Secondary | ICD-10-CM | POA: Diagnosis not present

## 2022-06-27 DIAGNOSIS — I10 Essential (primary) hypertension: Secondary | ICD-10-CM | POA: Diagnosis not present

## 2022-06-27 DIAGNOSIS — I5033 Acute on chronic diastolic (congestive) heart failure: Secondary | ICD-10-CM | POA: Diagnosis not present

## 2022-06-27 LAB — LIPID PANEL
Cholesterol: 112 mg/dL (ref 0–200)
HDL: 38 mg/dL — ABNORMAL LOW (ref >40)
LDL Cholesterol: 58 mg/dL (ref 0–99)
Total CHOL/HDL Ratio: 2.9 RATIO
Triglycerides: 80 mg/dL (ref <150)
VLDL: 16 mg/dL (ref 0–40)

## 2022-06-27 LAB — TYPE AND SCREEN
ABO/RH(D): A POS
Antibody Screen: NEGATIVE
Unit division: 0

## 2022-06-27 LAB — CBC
HCT: 24.7 % — ABNORMAL LOW (ref 36.0–46.0)
HCT: 27 % — ABNORMAL LOW (ref 36.0–46.0)
Hemoglobin: 7.8 g/dL — ABNORMAL LOW (ref 12.0–15.0)
Hemoglobin: 8.6 g/dL — ABNORMAL LOW (ref 12.0–15.0)
MCH: 29.9 pg (ref 26.0–34.0)
MCH: 30.1 pg (ref 26.0–34.0)
MCHC: 31.6 g/dL (ref 30.0–36.0)
MCHC: 31.9 g/dL (ref 30.0–36.0)
MCV: 94.6 fL (ref 80.0–100.0)
MCV: 94.4 fL (ref 80.0–100.0)
Platelets: 248 10*3/uL (ref 150–400)
Platelets: 278 10*3/uL (ref 150–400)
RBC: 2.61 MIL/uL — ABNORMAL LOW (ref 3.87–5.11)
RBC: 2.86 MIL/uL — ABNORMAL LOW (ref 3.87–5.11)
RDW: 17.9 % — ABNORMAL HIGH (ref 11.5–15.5)
RDW: 17.9 % — ABNORMAL HIGH (ref 11.5–15.5)
WBC: 5.8 10*3/uL (ref 4.0–10.5)
WBC: 9.1 10*3/uL (ref 4.0–10.5)
nRBC: 0.5 % — ABNORMAL HIGH (ref 0.0–0.2)
nRBC: 0.2 % (ref 0.0–0.2)

## 2022-06-27 LAB — GLUCOSE, CAPILLARY
Glucose-Capillary: 125 mg/dL — ABNORMAL HIGH (ref 70–99)
Glucose-Capillary: 153 mg/dL — ABNORMAL HIGH (ref 70–99)
Glucose-Capillary: 164 mg/dL — ABNORMAL HIGH (ref 70–99)
Glucose-Capillary: 178 mg/dL — ABNORMAL HIGH (ref 70–99)

## 2022-06-27 LAB — BASIC METABOLIC PANEL
Anion gap: 7 (ref 5–15)
BUN: 28 mg/dL — ABNORMAL HIGH (ref 8–23)
CO2: 27 mmol/L (ref 22–32)
Calcium: 8.9 mg/dL (ref 8.9–10.3)
Chloride: 105 mmol/L (ref 98–111)
Creatinine, Ser: 1.76 mg/dL — ABNORMAL HIGH (ref 0.44–1.00)
GFR, Estimated: 27 mL/min — ABNORMAL LOW (ref >60)
Glucose, Bld: 110 mg/dL — ABNORMAL HIGH (ref 70–99)
Potassium: 4.3 mmol/L (ref 3.5–5.1)
Sodium: 139 mmol/L (ref 135–145)

## 2022-06-27 LAB — BPAM RBC
Blood Product Expiration Date: 202312152359
ISSUE DATE / TIME: 202311161350
Unit Type and Rh: 6200

## 2022-06-27 LAB — MAGNESIUM: Magnesium: 1.5 mg/dL — ABNORMAL LOW (ref 1.7–2.4)

## 2022-06-27 LAB — TROPONIN I (HIGH SENSITIVITY)
Troponin I (High Sensitivity): 455 ng/L (ref <18)
Troponin I (High Sensitivity): 383 ng/L (ref <18)

## 2022-06-27 MED ORDER — CYCLOBENZAPRINE HCL 10 MG PO TABS
5.0000 mg | ORAL_TABLET | Freq: Two times a day (BID) | ORAL | Status: DC | PRN
  Administered 2022-06-27: 5 mg via ORAL
  Filled 2022-06-27: qty 1

## 2022-06-27 MED ORDER — METOPROLOL TARTRATE 25 MG PO TABS
25.0000 mg | ORAL_TABLET | Freq: Two times a day (BID) | ORAL | Status: DC
  Administered 2022-06-27 – 2022-06-28 (×2): 25 mg via ORAL
  Filled 2022-06-27 (×2): qty 1

## 2022-06-27 MED ORDER — ISOSORBIDE MONONITRATE ER 60 MG PO TB24
60.0000 mg | ORAL_TABLET | Freq: Every day | ORAL | Status: DC
  Administered 2022-06-28: 60 mg via ORAL
  Filled 2022-06-27: qty 1

## 2022-06-27 NOTE — Progress Notes (Signed)
.  vest=37%

## 2022-06-27 NOTE — Progress Notes (Signed)
[facility] Cardiology    SUBJECTIVE: Patient states he felt reasonably well no further chest pain no further shortness of breath resting comfortably in bed eager to ambulate in the halls and exercise   Vitals:   06/26/22 2109 06/27/22 0016 06/27/22 0444 06/27/22 0746  BP: (!) 151/52 (!) 138/48 (!) 118/45 (!) 145/45  Pulse: 71 66 (!) 57 62  Resp: 20 18 18 18   Temp: 98 F (36.7 C) 98 F (36.7 C) (!) 97.5 F (36.4 C) 98 F (36.7 C)  TempSrc:    Oral  SpO2: 96% 96% 99% 99%  Weight: 59.1 kg     Height: 5\' 3"  (1.6 m)        Intake/Output Summary (Last 24 hours) at 06/27/2022 0840 Last data filed at 06/27/2022 0749 Gross per 24 hour  Intake 290 ml  Output 3200 ml  Net -2910 ml      PHYSICAL EXAM  General: Well developed, well nourished, in no acute distress HEENT:  Normocephalic and atramatic Neck:  No JVD.  Lungs: Clear bilaterally to auscultation and percussion. Heart: HRRR . Normal S1 and S2 without gallops or 2/6 sem murmurs.  Abdomen: Bowel sounds are positive, abdomen soft and non-tender  Msk:  Back normal, normal gait. Normal strength and tone for age. Extremities: No clubbing, cyanosis or edema.   Neuro: Alert and oriented X 3. Psych:  Good affect, responds appropriately   LABS: Basic Metabolic Panel: Recent Labs    06/26/22 0829 06/27/22 0518  NA 136 139  K 4.1 4.3  CL 108 105  CO2 18* 27  GLUCOSE 180* 110*  BUN 28* 28*  CREATININE 1.77* 1.76*  CALCIUM 9.1 8.9  MG  --  1.5*   Liver Function Tests: No results for input(s): "AST", "ALT", "ALKPHOS", "BILITOT", "PROT", "ALBUMIN" in the last 72 hours. No results for input(s): "LIPASE", "AMYLASE" in the last 72 hours. CBC: Recent Labs    06/26/22 2235 06/27/22 0518  WBC 6.5 5.8  HGB 7.8* 7.8*  HCT 24.0* 24.7*  MCV 94.1 94.6  PLT 241 248   Cardiac Enzymes: No results for input(s): "CKTOTAL", "CKMB", "CKMBINDEX", "TROPONINI" in the last 72 hours. BNP: Invalid input(s): "POCBNP" D-Dimer: No results  for input(s): "DDIMER" in the last 72 hours. Hemoglobin A1C: Recent Labs    06/26/22 1040  HGBA1C 4.8   Fasting Lipid Panel: Recent Labs    06/27/22 0518  CHOL 112  HDL 38*  LDLCALC 58  TRIG 80  CHOLHDL 2.9   Thyroid Function Tests: No results for input(s): "TSH", "T4TOTAL", "T3FREE", "THYROIDAB" in the last 72 hours.  Invalid input(s): "FREET3" Anemia Panel: Recent Labs    06/26/22 1017 06/26/22 1129  VITAMINB12  --  285  FOLATE 20.9  --   FERRITIN 216  --   TIBC 329  --   IRON 137  --   RETICCTPCT  --  9.0*    DG Chest Port 1 View  Result Date: 06/26/2022 CLINICAL DATA:  Shortness of breath EXAM: PORTABLE CHEST 1 VIEW COMPARISON:  Previous studies including the examination of 06/13/2022 FINDINGS: Transverse diameter of heart is increased. Central pulmonary vessels are prominent. There is diffuse increase in interstitial markings in both lungs, more so in the lower lung fields with interval worsening. There is no focal pulmonary consolidation. Small bilateral pleural effusions are seen. IMPRESSION: Cardiomegaly. There is interval worsening of signs of pulmonary edema. Small bilateral pleural effusions, more so on the right side. Electronically Signed   By: Elmer Picker  M.D.   On: 06/26/2022 09:00     Echo preserved left ventricular function from previous echo  TELEMETRY: Normal sinus rhythm rate of 60 nonspecific ST-T wave changes  ASSESSMENT AND PLAN:  Principal Problem:   Acute on chronic diastolic CHF (congestive heart failure) (HCC) Active Problems:   Iron deficiency anemia   Essential hypertension   GERD without esophagitis   HLD (hyperlipidemia)   Type II diabetes mellitus with renal manifestations (HCC)   Chronic kidney disease, stage 3b (HCC)   Symptomatic anemia   CAD (coronary artery disease)   Myocardial injury    PLan Further troponins possibly demand ischemia versus non-STEMI continue medical therapy Significant renal insufficiency  slightly improved from previously recommend continue outpatient follow-up with nephrology Diastolic congestive heart failure would recommend continued aggressive medical therapy diuretics metoprolol amlodipine Diabetes type 2 continue diabetes management and care as per primary physician Anemia significant would recommend transfusion to hemoglobin of 8-9.  This will improve the patient's overall condition and symptomatology Recommend PPI therapy for GERD symptoms Continue statin therapy for hyperlipidemia management Do not recommend any invasive procedures at this stage Increase Imdur to 60 mg daily and metoprolol 25 mg twice a day Consider early discharge follow-up with cardiology 1 to 2 weeks   Yolonda Kida, MD 06/27/2022 8:40 AM

## 2022-06-27 NOTE — Consult Note (Addendum)
Heart Failure Nurse Navigator Note  She presented to the emergency room by way of EMS from home for complaints of chest discomfort not relieved with nitroglycerin, shortness of breath, mild cough.  BNP 480.  Chest x-ray revealed pulmonary edema.  HFpEF 60 to 65%.  Mild LVH.  Grade 1 diastolic dysfunction.  Mild to moderate mitral regurgitation.  Moderate to severe aortic stenosis.  Comorbidities:  Hypertension Hyperlipidemia Diabetes Coronary artery disease Chronic kidney disease stage IIIb GERD Iron deficiency anemia  She was last hospitalized November 3 through 5 for heart failure and NSTEMI.  BNP at that time was 513.  Chest  x-ray revealed cardiomegaly and interstitial pulmonary edema.  Medications:  Amlodipine 1 mg daily Enteric-coated aspirin 81 mg daily Ferrous sulfate 325 mg daily Furosemide 40 mg IV every 12 hours  isosorbide mononitrate 30 mg daily Metoprolol tartrate 12.5 mg 2 times a day Crestor 5 mg daily  Labs:  Sodium 139, potassium 4.3, chloride 105, CO2 27, BUN 28, creatinine 1.76, estimated GFR 27, magnesium 1.5, hemoglobin 7.8, hematocrit 24.7. Weight is 59.1 kg Blood pressure 145/45 Intake 290 mL Output 2100 mL  Initial meeting with patient, she was lying in bed currently on room air in no acute distress.  States that she continues to live at home by herself.  She works at [employer] Monday through Friday from 11-1 30.  She drives herself to work and  to all of her appointments.  She states her daughter is a Marine scientist and helps her with her medication list and setting up her weekly pillbox.  From previous hospitalization patient did not remember to be limiting her sodium intake along with her fluid intake.  Discussed her diet.  She states that she likes to eat potato chips, corn puffs and mentioned a Chinese soup that she makes which she adds the seasoning packet.  Made her aware that she needs to be reading labels and not taking in any more total  than 2000 mg a day.  Suggested if she wanted potato chips to look at what the serving size was and then to see what the sodium content was.  She voices understanding.  Also discussed her fluid restriction.  She states that she drinks 8 ounces of coffee in the morning, a 8 ounce juice, she feels as tumbler with water to take to work and she is unaware of how much that contains.  Then she drinks later in the afternoon and a half of a 12 ounce bottle of water.  Discussed  measuring how much that black tumbler hold so she knows exactly what she is taking in.  Patient patient states that she does not weigh herself on a daily basis.  Suggested that she is get into the habit of weighing herself daily and reporting a 2 to 3 pound weight gain overnight or 5 pounds within the week.  She voices understanding.  She states that she is compliant with her medications and has not missed any.  But she feels that the Lasix was taken away from her regimen.  Discussed follow-up in the outpatient heart failure clinic for which she has an appointment on December 4 at 2:30 in the afternoon.  Reds vest clip reading was 37%.  She had no further questions.  Was given the living with heart failure teaching booklet, zone magnet, follow on low-sodium and heart failure along with weight chart.  Discussed [facility], pt does not email, text etc with her phone and is not  interested.  Pricilla Riffle RN CHRN

## 2022-06-27 NOTE — Progress Notes (Signed)
PROGRESS NOTE   HPI was taken from Dr. Blaine Hamper: Lisa Mcneil is a 88 y.o. female with medical history significant of dCHF, HTN, HLD, DM, CAD, NSTEMI, CKD-3b, GERD, RLS, iron deficiency anemia, who presents with shortness of breath.   Patient was recently hospitalized from 11/3 - 11/5 due to NSTEMI, dCHF exacerbation and worsening anemia.  Patient received 1 unit of blood transfusion.  Hemoglobin increased from 7.2 to 9.8 on 11/5.   Patient states she started having shortness breath in the early morning, which has been progressively worsening.  Patient has mild dry cough, no fever or chills.  She reports that she had chest pain initially, which has resolved.  Her chest pain was located in the substernal area, pressure-like, mild to moderate, not radiating.  Currently denies any chest pain.  Patient does not have nausea, vomiting or abdominal pain.  Patient states that her stool has always been dark since she started taking iron supplement.  She did not notice any change in her stool.   Patient had oxygen desaturation to 76% initially, initially started on 5 L oxygen, then improved to 95% on 3.5 L oxygen.  Patient does not have respiratory distress when I saw patient in ED.   Data reviewed independently and ED Course: pt was found to have hemoglobin 6.9 (9.8 on 06/15/2022), WBC 7.9, BNP 480, troponin level 15, stable renal function, temperature 99.2, blood pressure 144/50, heart rate 90, RR 28.  X-ray showed cardiomegaly and interstitial pulmonary edema with bilateral small pleural effusion.  Patient is admitted to PCU as inpatient    Cintia Gleed  WFU:[id number] DOB: [birth year] DOA: 06/26/2022 PCP: Dion Body, MD   Assessment & Plan:   Principal Problem:   Acute on chronic diastolic CHF (congestive heart failure) ([place]) Active Problems:   CAD (coronary artery disease)   Myocardial injury   Symptomatic anemia   Iron deficiency anemia   Essential hypertension   Type II  diabetes mellitus with renal manifestations (H[facility])   Chronic kidney disease, stage 3b ([place])   HLD (hyperlipidemia)   GERD without esophagitis  Assessment and Plan: Acute on chronic diastolic CHF: echo on 20/09/5425 showed EF of 60 to 65% with grade 1 diastolic dysfunction.  BNP is elevated at 480. Continue on IV lasix. Monitor I/Os and daily weights. Needs CHF education. Cardio following and recs apprec   CAD: w/ elevated troponins. Elevated troponins secondary to demand ischemia   Normocytic anemia: likely secondary to CKD. H&H are stable. S/p 1 unit of pRBCs transfusion on admission. Will d/c iron supplement as iron level 137   HTN: continue on imdur, metoprolol, amlodipine. Holding home dose of lisinopril-HCTZ   DM2:  HbA1c 5.6, well controlled. Continue on SSI w/ accuchecks    CKDIIIb: Cr is labile. Avoid nephrotoxic meds    HLD: continue on statin    GERD: continue on PPI       DVT prophylaxis: SCDs Code Status: full  Family Communication: discussed pt's care w/ pt's son, Delfino Lovett, and answered his questions  Disposition Plan:  likely d/c back home.  Level of care: Progressive  Status is: Inpatient Remains inpatient appropriate because: severity of illness    Consultants:  Cardio   Procedures:   Antimicrobials:   Subjective: Pt c/o fatigue   Objective: Vitals:   06/26/22 2109 06/27/22 0016 06/27/22 0444 06/27/22 0746  BP: (!) 151/52 (!) 138/48 (!) 118/45 (!) 145/45  Pulse: 71 66 (!) 57 62  Resp: 20 18 18 18   Temp: 98 F (  36.7 C) 98 F (36.7 C) (!) 97.5 F (36.4 C) 98 F (36.7 C)  TempSrc:    Oral  SpO2: 96% 96% 99% 99%  Weight: 59.1 kg     Height: 5\' 3"  (1.6 m)       Intake/Output Summary (Last 24 hours) at 06/27/2022 0758 Last data filed at 06/27/2022 0749 Gross per 24 hour  Intake 290 ml  Output 3200 ml  Net -2910 ml   Filed Weights   06/26/22 2109  Weight: 59.1 kg    Examination:  General exam: Appears calm and comfortable   Respiratory system: Clear to auscultation. Respiratory effort normal. Cardiovascular system: S1 & S2 +. No rubs, gallops or clicks. No pedal edema. Gastrointestinal system: Abdomen is nondistended, soft and nontender.  Normal bowel sounds heard. Central nervous system: Alert and oriented. Moves all extremities  Psychiatry: Judgement and insight appear normal. Mood & affect appropriate.     Data Reviewed: I have personally reviewed following labs and imaging studies  CBC: Recent Labs  Lab 06/26/22 0829 06/26/22 1017 06/26/22 1719 06/26/22 2235 06/27/22 0518  WBC 7.9 7.7 7.6 6.5 5.8  HGB 6.9* 7.0* 8.0* 7.8* 7.8*  HCT 22.0* 22.4* 24.9* 24.0* 24.7*  MCV 99.5 99.1 94.7 94.1 94.6  PLT 258 267 249 241 329   Basic Metabolic Panel: Recent Labs  Lab 06/26/22 0829 06/27/22 0518  NA 136 139  K 4.1 4.3  CL 108 105  CO2 18* 27  GLUCOSE 180* 110*  BUN 28* 28*  CREATININE 1.77* 1.76*  CALCIUM 9.1 8.9  MG  --  1.5*   GFR: Estimated Creatinine Clearance: 17.2 mL/min (A) (by C-G formula based on SCr of 1.76 mg/dL (H)). Liver Function Tests: No results for input(s): "AST", "ALT", "ALKPHOS", "BILITOT", "PROT", "ALBUMIN" in the last 168 hours. No results for input(s): "LIPASE", "AMYLASE" in the last 168 hours. No results for input(s): "AMMONIA" in the last 168 hours. Coagulation Profile: Recent Labs  Lab 06/26/22 1719  INR 1.1   Cardiac Enzymes: No results for input(s): "CKTOTAL", "CKMB", "CKMBINDEX", "TROPONINI" in the last 168 hours. BNP (last 3 results) No results for input(s): "PROBNP" in the last 8760 hours. HbA1C: Recent Labs    06/26/22 1040  HGBA1C 4.8   CBG: Recent Labs  Lab 06/26/22 1134 06/26/22 1701 06/26/22 2108 06/27/22 0746  GLUCAP 214* 154* 192* 125*   Lipid Profile: Recent Labs    06/27/22 0518  CHOL 112  HDL 38*  LDLCALC 58  TRIG 80  CHOLHDL 2.9   Thyroid Function Tests: No results for input(s): "TSH", "T4TOTAL", "FREET4", "T3FREE",  "THYROIDAB" in the last 72 hours. Anemia Panel: Recent Labs    06/26/22 1017 06/26/22 1129  VITAMINB12  --  285  FOLATE 20.9  --   FERRITIN 216  --   TIBC 329  --   IRON 137  --   RETICCTPCT  --  9.0*   Sepsis Labs: No results for input(s): "PROCALCITON", "LATICACIDVEN" in the last 168 hours.  Recent Results (from the past 240 hour(s))  SARS Coronavirus 2 by RT PCR (hospital order, performed in [facility] hospital lab) *cepheid single result test* Anterior Nasal Swab     Status: None   Collection Time: 06/26/22 11:29 AM   Specimen: Anterior Nasal Swab  Result Value Ref Range Status   SARS Coronavirus 2 by RT PCR NEGATIVE NEGATIVE Final    Comment: (NOTE) SARS-CoV-2 target nucleic acids are NOT DETECTED.  The SARS-CoV-2 RNA is generally detectable in upper  and lower respiratory specimens during the acute phase of infection. The lowest concentration of SARS-CoV-2 viral copies this assay can detect is 250 copies / mL. A negative result does not preclude SARS-CoV-2 infection and should not be used as the sole basis for treatment or other patient management decisions.  A negative result may occur with improper specimen collection / handling, submission of specimen other than nasopharyngeal swab, presence of viral mutation(s) within the areas targeted by this assay, and inadequate number of viral copies (<250 copies / mL). A negative result must be combined with clinical observations, patient history, and epidemiological information.  Fact Sheet for Patients:   https://www.patel.info/  Fact Sheet for Healthcare Providers: https://hall.com/  This test is not yet approved or  cleared by the [place] FDA and has been authorized for detection and/or diagnosis of SARS-CoV-2 by FDA under an Emergency Use Authorization (EUA).  This EUA will remain in effect (meaning this test can be used) for the duration of the COVID-19 declaration  under Section 564(b)(1) of the Act, 21 U.S.C. section 360bbb-3(b)(1), unless the authorization is terminated or revoked sooner.  Performed at [facility], [address] [place], [place] [address]          Radiology Studies: DG Chest [place] 1 View  Result Date: 06/26/2022 CLINICAL DATA:  Shortness of breath EXAM: PORTABLE CHEST 1 VIEW COMPARISON:  Previous studies including the examination of 06/13/2022 FINDINGS: Transverse diameter of heart is increased. Central pulmonary vessels are prominent. There is diffuse increase in interstitial markings in both lungs, more so in the lower lung fields with interval worsening. There is no focal pulmonary consolidation. Small bilateral pleural effusions are seen. IMPRESSION: Cardiomegaly. There is interval worsening of signs of pulmonary edema. Small bilateral pleural effusions, more so on the right side. Electronically Signed   By: Elmer Picker M.D.   On: 06/26/2022 09:00        Scheduled Meds:  amLODipine  10 mg Oral Daily   aspirin EC  81 mg Oral Daily   ferrous sulfate  325 mg Oral Daily   furosemide  40 mg Intravenous Q12H   gabapentin  100 mg Oral QHS   insulin aspart  0-5 Units Subcutaneous QHS   insulin aspart  0-9 Units Subcutaneous TID WC   isosorbide mononitrate  30 mg Oral Daily   melatonin  10 mg Oral QHS   metoprolol tartrate  12.5 mg Oral BID   multivitamin with minerals  1 tablet Oral Daily   pantoprazole  40 mg Oral BID   pramipexole  0.25 mg Oral BID   rosuvastatin  5 mg Oral Daily   traZODone  50 mg Oral QHS   Continuous Infusions:  sodium chloride Stopped (06/26/22 2024)     LOS: 1 day    Time spent: 35 mins     Wyvonnia Dusky, MD [facility] Pager [phone]  If 7PM-7AM, please contact night-coverage [email or web address] 06/27/2022, 7:58 AM

## 2022-06-28 DIAGNOSIS — N184 Chronic kidney disease, stage 4 (severe): Secondary | ICD-10-CM

## 2022-06-28 DIAGNOSIS — I5033 Acute on chronic diastolic (congestive) heart failure: Secondary | ICD-10-CM | POA: Diagnosis not present

## 2022-06-28 DIAGNOSIS — I10 Essential (primary) hypertension: Secondary | ICD-10-CM | POA: Diagnosis not present

## 2022-06-28 DIAGNOSIS — I5A Non-ischemic myocardial injury (non-traumatic): Secondary | ICD-10-CM

## 2022-06-28 DIAGNOSIS — D509 Iron deficiency anemia, unspecified: Secondary | ICD-10-CM | POA: Diagnosis not present

## 2022-06-28 DIAGNOSIS — E1122 Type 2 diabetes mellitus with diabetic chronic kidney disease: Secondary | ICD-10-CM

## 2022-06-28 LAB — CBC
HCT: 27.3 % — ABNORMAL LOW (ref 36.0–46.0)
Hemoglobin: 8.8 g/dL — ABNORMAL LOW (ref 12.0–15.0)
MCH: 30.7 pg (ref 26.0–34.0)
MCHC: 32.2 g/dL (ref 30.0–36.0)
MCV: 95.1 fL (ref 80.0–100.0)
Platelets: 270 10*3/uL (ref 150–400)
RBC: 2.87 MIL/uL — ABNORMAL LOW (ref 3.87–5.11)
RDW: 17.8 % — ABNORMAL HIGH (ref 11.5–15.5)
WBC: 6.3 10*3/uL (ref 4.0–10.5)
nRBC: 0 % (ref 0.0–0.2)

## 2022-06-28 LAB — BASIC METABOLIC PANEL
Anion gap: 8 (ref 5–15)
BUN: 35 mg/dL — ABNORMAL HIGH (ref 8–23)
CO2: 26 mmol/L (ref 22–32)
Calcium: 9 mg/dL (ref 8.9–10.3)
Chloride: 104 mmol/L (ref 98–111)
Creatinine, Ser: 1.93 mg/dL — ABNORMAL HIGH (ref 0.44–1.00)
GFR, Estimated: 24 mL/min — ABNORMAL LOW (ref >60)
Glucose, Bld: 117 mg/dL — ABNORMAL HIGH (ref 70–99)
Potassium: 4.2 mmol/L (ref 3.5–5.1)
Sodium: 138 mmol/L (ref 135–145)

## 2022-06-28 LAB — GLUCOSE, CAPILLARY: Glucose-Capillary: 116 mg/dL — ABNORMAL HIGH (ref 70–99)

## 2022-06-28 MED ORDER — METOPROLOL TARTRATE 25 MG PO TABS: 25.0000 mg | ORAL_TABLET | Freq: Two times a day (BID) | ORAL | 0 refills | Status: DC

## 2022-06-28 MED ORDER — FUROSEMIDE 40 MG PO TABS: 40.0000 mg | ORAL_TABLET | Freq: Two times a day (BID) | ORAL | 0 refills | Status: DC

## 2022-06-28 MED ORDER — ISOSORBIDE MONONITRATE ER 60 MG PO TB24: 60.0000 mg | ORAL_TABLET | Freq: Every day | ORAL | 0 refills | Status: DC

## 2022-06-28 NOTE — Assessment & Plan Note (Signed)
Hemoglobin 12.8.  Can discontinue Glucophage.  Patient with chronic kidney disease stage IV.  Creatinine 1.93 upon discharge with a GFR of 24

## 2022-06-28 NOTE — Assessment & Plan Note (Addendum)
Secondary to congestive heart failure.  With chronic kidney disease, cardiology recommended medical management.  Continue metoprolol, Imdur, Crestor and aspirin.

## 2022-06-28 NOTE — Assessment & Plan Note (Addendum)
Patient diuresed well with Lasix 40 mg IV twice daily.  She will be switched over to Lasix orally 40 mg twice daily upon discharge.  Follow-up with CHF clinic cardiology and medical team.  Recommend checking BMP and follow-up appointment.  Patient on metoprolol.

## 2022-06-28 NOTE — Progress Notes (Signed)
SUBJECTIVE: No chest pain or shortness of breath   Vitals:   06/27/22 2012 06/28/22 0007 06/28/22 0443 06/28/22 0742  BP: (!) 123/46 (!) 131/49 (!) 124/57 (!) 122/98  Pulse: 66 61 68 (!) 56  Resp:    17  Temp:  97.9 F (36.6 C) 97.6 F (36.4 C) 97.8 F (36.6 C)  TempSrc:  Oral Oral   SpO2: 94% 91% 93% 97%  Weight:      Height:        Intake/Output Summary (Last 24 hours) at 06/28/2022 1002 Last data filed at 06/28/2022 0906 Gross per 24 hour  Intake 480 ml  Output 1975 ml  Net -1495 ml    LABS: Basic Metabolic Panel: Recent Labs    06/27/22 0518 06/28/22 0437  NA 139 138  K 4.3 4.2  CL 105 104  CO2 27 26  GLUCOSE 110* 117*  BUN 28* 35*  CREATININE 1.76* 1.93*  CALCIUM 8.9 9.0  MG 1.5*  --    Liver Function Tests: No results for input(s): "AST", "ALT", "ALKPHOS", "BILITOT", "PROT", "ALBUMIN" in the last 72 hours. No results for input(s): "LIPASE", "AMYLASE" in the last 72 hours. CBC: Recent Labs    06/27/22 1249 06/28/22 0437  WBC 9.1 6.3  HGB 8.6* 8.8*  HCT 27.0* 27.3*  MCV 94.4 95.1  PLT 278 270   Cardiac Enzymes: No results for input(s): "CKTOTAL", "CKMB", "CKMBINDEX", "TROPONINI" in the last 72 hours. BNP: Invalid input(s): "POCBNP" D-Dimer: No results for input(s): "DDIMER" in the last 72 hours. Hemoglobin A1C: Recent Labs    06/26/22 1040  HGBA1C 4.8   Fasting Lipid Panel: Recent Labs    06/27/22 0518  CHOL 112  HDL 38*  LDLCALC 58  TRIG 80  CHOLHDL 2.9   Thyroid Function Tests: No results for input(s): "TSH", "T4TOTAL", "T3FREE", "THYROIDAB" in the last 72 hours.  Invalid input(s): "FREET3" Anemia Panel: Recent Labs    06/26/22 1017 06/26/22 1129  VITAMINB12  --  285  FOLATE 20.9  --   FERRITIN 216  --   TIBC 329  --   IRON 137  --   RETICCTPCT  --  9.0*     PHYSICAL EXAM General: Well developed, well nourished, in no acute distress HEENT:  Normocephalic and atramatic Neck:  No JVD.  Lungs: Clear bilaterally to  auscultation and percussion. Heart: HRRR . Normal S1 and S2 without gallops or murmurs.  Abdomen: Bowel sounds are positive, abdomen soft and non-tender  Msk:  Back normal, normal gait. Normal strength and tone for age. Extremities: No clubbing, cyanosis or edema.   Neuro: Alert and oriented X 3. Psych:  Good affect, responds appropriately  TELEMETRY: Sinus rhythm  ASSESSMENT AND PLAN: Congestive heart failure/elevated troponin due to demand ischemia.  Patient is feeling much better this morning and can be discharged with follow-up with Dr. Clayborn Bigness in 1 to 2 weeks.  Principal Problem:   Acute on chronic diastolic CHF (congestive heart failure) (HCC) Active Problems:   Iron deficiency anemia   Essential hypertension   GERD without esophagitis   HLD (hyperlipidemia)   Type II diabetes mellitus with renal manifestations (HCC)   Chronic kidney disease, stage 3b (HCC)   Symptomatic anemia   CAD (coronary artery disease)   Myocardial injury    Lisa Mcneil A, MD, [facility] 06/28/2022 10:02 AM

## 2022-06-28 NOTE — Discharge Summary (Signed)
Physician Discharge Summary   [name] MRN: [id number] DOB: September 14, 1927  Admit date:     06/26/2022  Discharge date: 06/28/22  Discharge Physician: Loletha Grayer   PCP: Dion Body, MD   Recommendations at discharge:   Follow-up PCP 5 days Follow-up cardiology 1 week Follow-up CHF clinic Check BMP and CBC and follow-up appointment. Can consider referral to hematology to watch blood counts closely.  Discharge Diagnoses: Principal Problem:   Acute on chronic diastolic CHF (congestive heart failure) (HCC) Active Problems:   CAD (coronary artery disease)   Myocardial injury   Symptomatic anemia   Iron deficiency anemia   Essential hypertension   CKD (chronic kidney disease) stage 4, GFR 15-29 ml/min (HCC)   Type II diabetes mellitus with renal manifestations ([place])   HLD (hyperlipidemia)   GERD without esophagitis    Hospital Course: The patient was admitted to the hospital on 06/26/2022 and discharged on 06/28/2022.  Patient initially came in with chest pain and was diagnosed with acute on chronic diastolic congestive heart failure and was diuresed with IV Lasix 40 mg IV twice daily.  The patient's troponins went up to 455 and then came down to 383.  Because of her chronic kidney disease, cardiology recommended medical management.  Patient was no longer having any further chest pain.  She was diuresed well.  And she wanted to go home.  She had no shortness of breath upon discharge.  Recommend daily Lasix.  Assessment and Plan: * Acute on chronic diastolic CHF (congestive heart failure) ([place]) Patient diuresed well with Lasix 40 mg IV twice daily.  She will be switched over to Lasix orally 40 mg twice daily upon discharge.  Follow-up with CHF clinic cardiology and medical team.  Recommend checking BMP and follow-up appointment.  Patient on metoprolol.  Myocardial injury Secondary to congestive heart failure.  With chronic kidney disease, cardiology  recommended medical management.  Continue metoprolol, Imdur, Crestor and aspirin.  Iron deficiency anemia Patient given a unit of packed red blood cells during the hospital course.  Last hemoglobin 8.8.  Plavix discontinued with iron deficiency anemia.  Follow-up with PCP for close follow-up of blood counts.  Essential hypertension Currently on Lasix, metoprolol and Norvasc.  Type II diabetes mellitus with renal manifestations (HCC) Hemoglobin 12.8.  Can discontinue Glucophage.  Patient with chronic kidney disease stage IV.  Creatinine 1.93 upon discharge with a GFR of 24  CKD (chronic kidney disease) stage 4, GFR 15-29 ml/min (HCC) Creatinine 1.93 with a GFR of 24 making this chronic kidney disease stage IV.  HLD (hyperlipidemia) Continue Crestor  GERD without esophagitis Continue PPI         Consultants: Cardiology Procedures performed: None Disposition: Home Diet recommendation:  Cardiac and Carb modified diet DISCHARGE MEDICATION: Allergies as of 06/28/2022       Reactions   Atorvastatin Other (See Comments)   Codeine    Demerol [meperidine]    Polysaccharide Iron Complex Other (See Comments)   Caused constipation   Naproxen Rash   Caused rash on mouth        Medication List     STOP taking these medications    Caltrate 600+D Plus Minerals 600-800 MG-UNIT Chew   clopidogrel 75 MG tablet Commonly known as: PLAVIX   lisinopril-hydrochlorothiazide 20-12.5 MG tablet Commonly known as: ZESTORETIC   metFORMIN 500 MG tablet Commonly known as: GLUCOPHAGE       TAKE these medications    amLODipine 10 MG tablet Commonly known as: NORVASC  Take 10 mg by mouth daily.   aspirin EC 81 MG tablet Take 1 tablet (81 mg total) by mouth daily. Swallow whole.   estradiol 0.1 MG/GM vaginal cream Commonly known as: ESTRACE SMARTSIG:Gram(s) Vaginal Twice a Week   ferrous sulfate 325 (65 FE) MG tablet Take 325 mg by mouth daily.   furosemide 40 MG  tablet Commonly known as: Lasix Take 1 tablet (40 mg total) by mouth 2 (two) times daily. What changed:  medication strength how much to take when to take this reasons to take this   gabapentin 100 MG capsule Commonly known as: NEURONTIN Take 100 mg by mouth at bedtime.   isosorbide mononitrate 60 MG 24 hr tablet Commonly known as: IMDUR Take 1 tablet (60 mg total) by mouth daily. Start taking on: June 29, 2022 What changed:  medication strength how much to take   Melatonin 10 MG Tabs Take 10 mg by mouth at bedtime.   metoprolol tartrate 25 MG tablet Commonly known as: LOPRESSOR Take 1 tablet (25 mg total) by mouth 2 (two) times daily. What changed: how much to take   multivitamin with minerals tablet Take 1 tablet by mouth daily.   nitroGLYCERIN 0.4 MG SL tablet Commonly known as: NITROSTAT Place 1 tablet (0.4 mg total) under the tongue every 5 (five) minutes as needed for chest pain.   omeprazole 20 MG capsule Commonly known as: PRILOSEC Take 20 mg by mouth 2 (two) times daily.   pramipexole 0.25 MG tablet Commonly known as: MIRAPEX Take 0.25 mg by mouth in the morning and at bedtime.   rosuvastatin 5 MG tablet Commonly known as: CRESTOR Take 5 mg by mouth at bedtime.   traZODone 50 MG tablet Commonly known as: DESYREL Take 50 mg by mouth at bedtime.        Follow-up Information     Callwood, Loran Senters, MD. Go in 7 day(s).   Specialties: Cardiology, Internal Medicine Contact information: [place] [address] [phone]         Dion Body, MD Follow up in 5 day(s).   Specialty: Family Medicine Contact information: [place] [place] [address] [phone]                Discharge Exam: Danley Danker Weights   06/26/22 2109  Weight: 59.1 kg   Physical Exam HENT:     Head: Normocephalic.     Mouth/Throat:     Pharynx: No oropharyngeal exudate.  Eyes:      General: Lids are normal.     Conjunctiva/sclera: Conjunctivae normal.  Cardiovascular:     Rate and Rhythm: Normal rate and regular rhythm.     Heart sounds: Normal heart sounds, S1 normal and S2 normal.  Pulmonary:     Breath sounds: Examination of the right-lower field reveals decreased breath sounds. Examination of the left-lower field reveals decreased breath sounds. Decreased breath sounds present. No wheezing, rhonchi or rales.  Abdominal:     Palpations: Abdomen is soft.     Tenderness: There is no abdominal tenderness.  Musculoskeletal:     Right lower leg: No swelling.     Left lower leg: No swelling.  Skin:    General: Skin is warm.     Findings: No rash.  Neurological:     Mental Status: She is alert and oriented to person, place, and time.      Condition at discharge: stable  The results of significant diagnostics from this  hospitalization (including imaging, microbiology, ancillary and laboratory) are listed below for reference.   Imaging Studies: DG Chest Port 1 View  Result Date: 06/26/2022 CLINICAL DATA:  Shortness of breath EXAM: PORTABLE CHEST 1 VIEW COMPARISON:  Previous studies including the examination of 06/13/2022 FINDINGS: Transverse diameter of heart is increased. Central pulmonary vessels are prominent. There is diffuse increase in interstitial markings in both lungs, more so in the lower lung fields with interval worsening. There is no focal pulmonary consolidation. Small bilateral pleural effusions are seen. IMPRESSION: Cardiomegaly. There is interval worsening of signs of pulmonary edema. Small bilateral pleural effusions, more so on the right side. Electronically Signed   By: Elmer Picker M.D.   On: 06/26/2022 09:00   ECHOCARDIOGRAM COMPLETE  Result Date: 06/15/2022    ECHOCARDIOGRAM REPORT   Patient Name:   LAIYA WISBY Date of Exam: 06/15/2022 Medical Rec #:  [id number]          Height:       63.0 in Accession #:    [id number]          Weight:       123.9 lb Date of Birth:  11/12/1927         BSA:          1.578 m Patient Age:    38 years           BP:           131/47 mmHg Patient Gender: F                  HR:           68 bpm. Exam Location:  [facility] Procedure: 2D Echo, Cardiac Doppler and Color Doppler Indications:    CHF- Acute Diastolic X73.53  History:        Patient has prior history of Echocardiogram examinations, most                 recent 11/27/2020.  Sonographer:    Ronny Flurry Referring Phys: [id number] [place]  1. Left ventricular ejection fraction, by estimation, is 60 to 65%. The left ventricle has normal function. The left ventricle has no regional wall motion abnormalities. There is mild left ventricular hypertrophy. Left ventricular diastolic parameters are consistent with Grade I diastolic dysfunction (impaired relaxation).  2. Right ventricular systolic function is normal. The right ventricular size is normal.  3. The mitral valve is normal in structure. Mild to moderate mitral valve regurgitation. Moderate to severe mitral annular calcification.  4. Mild tricuspid stenosis.  5. The aortic valve is calcified. There is moderate calcification of the aortic valve. There is moderate thickening of the aortic valve. Aortic valve regurgitation is not visualized. Moderate to severe aortic valve stenosis. FINDINGS  Left Ventricle: Left ventricular ejection fraction, by estimation, is 60 to 65%. The left ventricle has normal function. The left ventricle has no regional wall motion abnormalities. The left ventricular internal cavity size was normal in size. There is  mild left ventricular hypertrophy. Left ventricular diastolic parameters are consistent with Grade I diastolic dysfunction (impaired relaxation). Right Ventricle: The right ventricular size is normal. No increase in right ventricular wall thickness. Right ventricular systolic function is normal. Left Atrium: Left atrial size was normal in size. Right Atrium:  Right atrial size was normal in size. Pericardium: There is no evidence of pericardial effusion. Mitral Valve: The mitral valve is normal in structure. There is moderate thickening of the mitral valve leaflet(s). There is  mild calcification of the mitral valve leaflet(s). Normal mobility of the mitral valve leaflets. Moderate to severe mitral annular calcification. Mild to moderate mitral valve regurgitation. Tricuspid Valve: The tricuspid valve is normal in structure. Tricuspid valve regurgitation is not demonstrated. Mild tricuspid stenosis. Aortic Valve: The aortic valve is calcified. There is moderate calcification of the aortic valve. There is moderate thickening of the aortic valve. There is moderate aortic valve annular calcification. Aortic valve regurgitation is not visualized. Moderate to severe aortic stenosis is present. Aortic valve mean gradient measures 27.0 mmHg. Aortic valve peak gradient measures 42.9 mmHg. Aortic valve area, by VTI measures 0.89 cm. Pulmonic Valve: The pulmonic valve was normal in structure. Pulmonic valve regurgitation is not visualized. Aorta: The ascending aorta was not well visualized. IAS/Shunts: No atrial level shunt detected by color flow Doppler.  LEFT VENTRICLE PLAX 2D LVIDd:         4.10 cm      Diastology LVIDs:         2.70 cm      LV e' medial:    5.33 cm/s LV PW:         1.30 cm      LV E/e' medial:  21.8 LV IVS:        1.20 cm      LV e' lateral:   4.24 cm/s LVOT diam:     1.80 cm      LV E/e' lateral: 27.4 LV SV:         72 LV SV Index:   46 LVOT Area:     2.54 cm  LV Volumes (MOD) LV vol d, MOD A2C: 71.4 ml LV vol d, MOD A4C: 107.0 ml LV vol s, MOD A2C: 24.6 ml LV vol s, MOD A4C: 36.3 ml LV SV MOD A2C:     46.8 ml LV SV MOD A4C:     107.0 ml LV SV MOD BP:      56.6 ml RIGHT VENTRICLE             IVC RV S prime:     12.70 cm/s  IVC diam: 1.80 cm TAPSE (M-mode): 2.0 cm LEFT ATRIUM             Index        RIGHT ATRIUM           Index LA diam:        3.40 cm 2.16  cm/m   RA Area:     12.50 cm LA Vol (A2C):   31.2 ml 19.78 ml/m  RA Volume:   30.10 ml  19.08 ml/m LA Vol (A4C):   71.9 ml 45.58 ml/m LA Biplane Vol: 51.4 ml 32.58 ml/m  AORTIC VALVE AV Area (Vmax):    0.78 cm AV Area (Vmean):   0.80 cm AV Area (VTI):     0.89 cm AV Vmax:           327.50 cm/s AV Vmean:          247.000 cm/s AV VTI:            0.808 m AV Peak Grad:      42.9 mmHg AV Mean Grad:      27.0 mmHg LVOT Vmax:         101.00 cm/s LVOT Vmean:        77.300 cm/s LVOT VTI:          0.284 m LVOT/AV VTI ratio: 0.35  AORTA Ao Root diam: 3.10  cm Ao Asc diam:  3.10 cm MITRAL VALVE MV Area (PHT): 2.95 cm     SHUNTS MV Decel Time: 257 msec     Systemic VTI:  0.28 m MV E velocity: 116.00 cm/s  Systemic Diam: 1.80 cm MV A velocity: 142.00 cm/s MV E/A ratio:  0.82 Dwayne D Callwood MD Electronically signed by Yolonda Kida MD Signature Date/Time: 06/15/2022/2:26:07 PM    Final    CT Angio Chest PE W and/or Wo Contrast  Result Date: 06/13/2022 CLINICAL DATA:  Pulmonary embolism (PE) suspected, high prob EXAM: CT ANGIOGRAPHY CHEST WITH CONTRAST TECHNIQUE: Multidetector CT imaging of the chest was performed using the standard protocol during bolus administration of intravenous contrast. Multiplanar CT image reconstructions and MIPs were obtained to evaluate the vascular anatomy. RADIATION DOSE REDUCTION: This exam was performed according to the departmental dose-optimization program which includes automated exposure control, adjustment of the mA and/or kV according to patient size and/or use of iterative reconstruction technique. CONTRAST:  40mL OMNIPAQUE IOHEXOL 350 MG/ML SOLN COMPARISON:  CT a chest 11/27/2020. FINDINGS: Cardiovascular: Satisfactory opacification of the pulmonary arteries to the segmental level. No evidence of pulmonary embolism. Mild cardiomegaly.No pericardial disease. Coronary artery atherosclerosis. Moderate atherosclerosis of the thoracic aorta. Mediastinum/Nodes: Mildly prominent  mediastinal lymph nodes, likely reactive. The thyroid is unremarkable. Esophagus is unremarkable. Lungs/Pleura: Moderate centrilobular and paraseptal emphysema. Mild diffuse bronchial wall thickening, lower lung predominant. There is diffuse interlobular septal thickening and ground-glass opacities bilaterally. There are more confluent airspace consolidations in the lung bases, likely atelectasis. There are trace pleural effusions, right greater than left. No evidence of pneumothorax. Upper Abdomen: No acute findings. Calcified granulomas in the spleen. Musculoskeletal: Unchanged mild chronic T2 superior endplate compression deformity. No acute osseous abnormality. No suspicious osseous lesion. Review of the MIP images confirms the above findings. IMPRESSION: Cardiomegaly with moderate pulmonary edema and trace pleural effusions. Bibasilar airspace consolidations favored to be atelectasis. No evidence of pulmonary embolism. Emphysema. Electronically Signed   By: Maurine Simmering M.D.   On: 06/13/2022 10:04   DG Chest Port 1 View  Result Date: 06/13/2022 CLINICAL DATA:  RIGHT-sided chest pain. EXAM: PORTABLE CHEST 1 VIEW COMPARISON:  Chest XR and CTA PE, 11/27/2020. FINDINGS: Cardiac silhouette is mildly enlarged. Aortic arch calcifications. Lungs are hypoinflated. Basilar and perihilar-predominant interstitial thickening. No discrete consolidation. Trivial RIGHT pleural effusion. No pneumothorax. No acute displaced fracture. IMPRESSION: 1. Cardiomegaly with mild interstitial pulmonary edema. 2.  Aortic Atherosclerosis (ICD10-I70.0). Electronically Signed   By: Michaelle Birks M.D.   On: 06/13/2022 07:49    Microbiology: Results for orders placed or performed during the hospital encounter of 06/26/22  SARS Coronavirus 2 by RT PCR (hospital order, performed in [facility] hospital lab) *cepheid single result test* Anterior Nasal Swab     Status: None   Collection Time: 06/26/22 11:29 AM   Specimen: Anterior Nasal  Swab  Result Value Ref Range Status   SARS Coronavirus 2 by RT PCR NEGATIVE NEGATIVE Final    Comment: (NOTE) SARS-CoV-2 target nucleic acids are NOT DETECTED.  The SARS-CoV-2 RNA is generally detectable in upper and lower respiratory specimens during the acute phase of infection. The lowest concentration of SARS-CoV-2 viral copies this assay can detect is 250 copies / mL. A negative result does not preclude SARS-CoV-2 infection and should not be used as the sole basis for treatment or other patient management decisions.  A negative result may occur with improper specimen collection / handling, submission of specimen other than  nasopharyngeal swab, presence of viral mutation(s) within the areas targeted by this assay, and inadequate number of viral copies (<250 copies / mL). A negative result must be combined with clinical observations, patient history, and epidemiological information.  Fact Sheet for Patients:   https://www.patel.info/  Fact Sheet for Healthcare Providers: https://hall.com/  This test is not yet approved or  cleared by the [place] FDA and has been authorized for detection and/or diagnosis of SARS-CoV-2 by FDA under an Emergency Use Authorization (EUA).  This EUA will remain in effect (meaning this test can be used) for the duration of the COVID-19 declaration under Section 564(b)(1) of the Act, 21 U.S.C. section 360bbb-3(b)(1), unless the authorization is terminated or revoked sooner.  Performed at [facility], [address] [place], [place] [address]     Labs: CBC: Recent Labs  Lab 06/26/22 1719 06/26/22 2235 06/27/22 0518 06/27/22 1249 06/28/22 0437  WBC 7.6 6.5 5.8 9.1 6.3  HGB 8.0* 7.8* 7.8* 8.6* 8.8*  HCT 24.9* 24.0* 24.7* 27.0* 27.3*  MCV 94.7 94.1 94.6 94.4 95.1  PLT 249 241 248 278 381   Basic Metabolic Panel: Recent Labs  Lab 06/26/22 0829 06/27/22 0518 06/28/22 0437  NA  136 139 138  K 4.1 4.3 4.2  CL 108 105 104  CO2 18* 27 26  GLUCOSE 180* 110* 117*  BUN 28* 28* 35*  CREATININE 1.77* 1.76* 1.93*  CALCIUM 9.1 8.9 9.0  MG  --  1.5*  --    Liver Function Tests: No results for input(s): "AST", "ALT", "ALKPHOS", "BILITOT", "PROT", "ALBUMIN" in the last 168 hours. CBG: Recent Labs  Lab 06/27/22 0746 06/27/22 1133 06/27/22 1654 06/27/22 2206 06/28/22 0741  GLUCAP 125* 153* 164* 178* 116*    Discharge time spent: greater than 30 minutes.  Signed: Loletha Grayer, MD [facility] 06/28/2022

## 2022-06-28 NOTE — Assessment & Plan Note (Signed)
Continue PPI ?

## 2022-06-28 NOTE — Assessment & Plan Note (Signed)
Currently on Lasix, metoprolol and Norvasc.

## 2022-06-28 NOTE — Assessment & Plan Note (Signed)
Continue Crestor 

## 2022-06-28 NOTE — Assessment & Plan Note (Signed)
Patient given a unit of packed red blood cells during the hospital course.  Last hemoglobin 8.8.  Plavix discontinued with iron deficiency anemia.  Follow-up with PCP for close follow-up of blood counts.

## 2022-06-28 NOTE — Assessment & Plan Note (Signed)
Creatinine 1.93 with a GFR of 24 making this chronic kidney disease stage IV.

## 2022-06-28 NOTE — Progress Notes (Signed)
Mobility Specialist - Progress Note     06/28/22 0900  Mobility  Activity Ambulated independently in hallway  Level of Assistance Independent  Assistive Device None  Distance Ambulated (ft) 320 ft  Activity Response Tolerated well  Mobility Referral Yes  $Mobility charge 1 Mobility   Pt aroused to voice and knock in bed on RA upon entry. Pt STS and ambulates in hallway indep. Pt returned recliner and left with needs in reach.   Loma Sender Mobility Specialist 06/28/22, 9:58 AM

## 2022-07-02 ENCOUNTER — Inpatient Hospital Stay: Payer: Medicare HMO

## 2022-07-02 DIAGNOSIS — D509 Iron deficiency anemia, unspecified: Secondary | ICD-10-CM | POA: Diagnosis not present

## 2022-07-02 DIAGNOSIS — Z87891 Personal history of nicotine dependence: Secondary | ICD-10-CM | POA: Diagnosis not present

## 2022-07-02 DIAGNOSIS — D508 Other iron deficiency anemias: Secondary | ICD-10-CM

## 2022-07-02 MED ORDER — SODIUM CHLORIDE 0.9 % IV SOLN
INTRAVENOUS | Status: AC | PRN
  Filled 2022-07-02: qty 250

## 2022-07-02 MED ORDER — SODIUM CHLORIDE 0.9 % IV SOLN
200.0000 mg | Freq: Once | INTRAVENOUS | Status: AC
  Administered 2022-07-02: 200 mg via INTRAVENOUS
  Filled 2022-07-02: qty 200

## 2022-07-09 DIAGNOSIS — I5032 Chronic diastolic (congestive) heart failure: Secondary | ICD-10-CM | POA: Diagnosis not present

## 2022-07-09 DIAGNOSIS — N184 Chronic kidney disease, stage 4 (severe): Secondary | ICD-10-CM | POA: Diagnosis not present

## 2022-07-11 ENCOUNTER — Encounter: Payer: Medicare HMO | Admitting: Family

## 2022-07-13 NOTE — Progress Notes (Deleted)
Patient ID: Lisa Mcneil, female    DOB: [birth year], 88 y.o.   MRN: [id number]  HPI  [name] is a 88 y/o female with a history of DM, HTN, anemia, GERD and chronic heart failure.   Echo report from 06/15/22 showed an EF of 60-65% along with mild LVH, mild/moderate MR and moderate/severe AR.  Echo report from 11/27/20 reviewed and showed an EF of 60-65% along with mild LVH, trivial MR and moderate AS.   Admitted 06/26/22 due to acute on chronic heart failure. Initially given IV lasix with transition to oral diuretics. Cardiology consult obtained. 1 unit PRBC's given due to anemia. Discharged after 2 days.   She presents today for a follow-up visit although hasn't been seen since 12/2020. She presents with a chief complaint of   Past Medical History:  Diagnosis Date   CHF (congestive heart failure) (Lisa Mcneil)    Diabetes mellitus without complication (HCC)    GERD (gastroesophageal reflux disease)    Hypertension    Insomnia    Iron deficiency anemia    RLS (restless legs syndrome)    Past Surgical History:  Procedure Laterality Date   BLADDER REPAIR     CATARACT EXTRACTION W/PHACO Right 05/13/2016   Procedure: CATARACT EXTRACTION PHACO AND INTRAOCULAR LENS PLACEMENT (Lisa Mcneil);  Surgeon: Lisa Robson, MD;  Location: [facility] ORS;  Service: Ophthalmology;  Laterality: Right;  Lisa Mcneil 1.09 AP% 21.0 CDE 14.60 Fluid Pack Lot # [id number]   CATARACT EXTRACTION W/PHACO Left 07/15/2016   Procedure: CATARACT EXTRACTION PHACO AND INTRAOCULAR LENS PLACEMENT ([facility]);  Surgeon: Lisa Robson, MD;  Location: [facility] ORS;  Service: Ophthalmology;  Laterality: Left;  Lot# [id number] H Lisa Mcneil: 01:17.9 AP%:27.8 CDE: 21.67    No family history on file. Social History   Tobacco Use   Smoking status: Former   Smokeless tobacco: Never  Substance Use Topics   Alcohol use: No   Allergies  Allergen Reactions   Atorvastatin Other (See Comments)   Codeine    Demerol [Meperidine]    Polysaccharide Iron Complex  Other (See Comments)    Caused constipation   Naproxen Rash    Caused rash on mouth    Review of Systems  Constitutional:  Negative for appetite change and fatigue.  HENT:  Negative for congestion, postnasal drip and sore throat.   Eyes: Negative.   Respiratory:  Negative for cough and shortness of breath.   Cardiovascular:  Negative for chest pain, palpitations and leg swelling.  Gastrointestinal:  Negative for abdominal distention and abdominal pain.  Endocrine: Negative.   Genitourinary: Negative.   Musculoskeletal:  Negative for back pain and neck pain.  Skin: Negative.   Allergic/Immunologic: Negative.   Neurological:  Negative for dizziness and light-headedness.  Hematological:  Negative for adenopathy. Does not bruise/bleed easily.  Psychiatric/Behavioral:  Negative for dysphoric mood and sleep disturbance. The patient is not nervous/anxious.       Physical Exam Vitals and nursing note reviewed.  Constitutional:      Appearance: Normal appearance.  HENT:     Head: Normocephalic and atraumatic.  Cardiovascular:     Rate and Rhythm: Normal rate and regular rhythm.  Pulmonary:     Effort: Pulmonary effort is normal. No respiratory distress.     Breath sounds: No wheezing or rales.  Abdominal:     General: There is no distension.     Palpations: Abdomen is soft.  Musculoskeletal:        General: No tenderness.     Cervical back: Normal range  of motion and neck supple.     Right lower leg: No edema.     Left lower leg: No edema.  Skin:    General: Skin is warm and dry.  Neurological:     General: No focal deficit present.     Mental Status: She is alert and oriented to person, place, and time.  Psychiatric:        Mood and Affect: Mood normal.        Behavior: Behavior normal.        Thought Content: Thought content normal.    Assessment & Plan:  1: Chronic heart failure with preserved ejection fraction with structural changes (LVH)- - NYHA class I -  euvolemic today - weighing daily and says that her weight has been stable; reminded to call for an overnight weight gain of > 2 pounds or a weekly weight gain of > 5 pounds - not adding salt and has been diligent in reading food labels for sodium content - on GDMT of  - saw cardiology Clayborn Bigness) 06/19/22 - BNP 06/26/22 was 480.3  2: HTN- - BP - saw PCP (Lisa Mcneil) 07/09/22 - BMP 06/28/22 reviewed and showed sodium 138, potassium 4.2, creatinine 1.93 and GFR 24  3: DM with CKD- - A1c 06/26/22 was 4.8% - glucose at home this morning was  - saw nephrology Lisa Mcneil) 06/19/22   Medication list reviewed.

## 2022-07-14 ENCOUNTER — Ambulatory Visit: Payer: Medicare HMO | Admitting: Family

## 2022-07-14 ENCOUNTER — Telehealth: Payer: Self-pay | Admitting: Family

## 2022-07-14 NOTE — Telephone Encounter (Signed)
Patient did not show for her Heart Failure Clinic appointment on 07/14/22. Will attempt to reschedule.   

## 2022-07-28 DIAGNOSIS — N1832 Chronic kidney disease, stage 3b: Secondary | ICD-10-CM | POA: Diagnosis not present

## 2022-07-28 DIAGNOSIS — E782 Mixed hyperlipidemia: Secondary | ICD-10-CM | POA: Diagnosis not present

## 2022-07-28 DIAGNOSIS — I10 Essential (primary) hypertension: Secondary | ICD-10-CM | POA: Diagnosis not present

## 2022-07-28 DIAGNOSIS — N183 Chronic kidney disease, stage 3 unspecified: Secondary | ICD-10-CM | POA: Diagnosis not present

## 2022-07-28 DIAGNOSIS — I5032 Chronic diastolic (congestive) heart failure: Secondary | ICD-10-CM | POA: Diagnosis not present

## 2022-07-28 DIAGNOSIS — E119 Type 2 diabetes mellitus without complications: Secondary | ICD-10-CM | POA: Diagnosis not present

## 2022-07-28 DIAGNOSIS — I350 Nonrheumatic aortic (valve) stenosis: Secondary | ICD-10-CM | POA: Diagnosis not present

## 2022-08-06 DIAGNOSIS — N814 Uterovaginal prolapse, unspecified: Secondary | ICD-10-CM | POA: Diagnosis not present

## 2022-08-06 DIAGNOSIS — Z4689 Encounter for fitting and adjustment of other specified devices: Secondary | ICD-10-CM | POA: Diagnosis not present

## 2022-09-12 ENCOUNTER — Emergency Department: Payer: Medicare HMO

## 2022-09-12 ENCOUNTER — Inpatient Hospital Stay
Admission: EM | Admit: 2022-09-12 | Discharge: 2022-09-16 | DRG: 280 | Disposition: A | Discharge status: Home / Self Care | Payer: Medicare HMO | Attending: Internal Medicine | Admitting: Internal Medicine

## 2022-09-12 ENCOUNTER — Encounter: Payer: Self-pay | Admitting: Internal Medicine

## 2022-09-12 ENCOUNTER — Other Ambulatory Visit: Payer: Self-pay

## 2022-09-12 DIAGNOSIS — R0902 Hypoxemia: Secondary | ICD-10-CM | POA: Diagnosis not present

## 2022-09-12 DIAGNOSIS — N184 Chronic kidney disease, stage 4 (severe): Secondary | ICD-10-CM | POA: Diagnosis not present

## 2022-09-12 DIAGNOSIS — Z7982 Long term (current) use of aspirin: Secondary | ICD-10-CM

## 2022-09-12 DIAGNOSIS — Z79890 Hormone replacement therapy (postmenopausal): Secondary | ICD-10-CM

## 2022-09-12 DIAGNOSIS — J9601 Acute respiratory failure with hypoxia: Secondary | ICD-10-CM | POA: Diagnosis not present

## 2022-09-12 DIAGNOSIS — G4700 Insomnia, unspecified: Secondary | ICD-10-CM | POA: Diagnosis present

## 2022-09-12 DIAGNOSIS — Z885 Allergy status to narcotic agent status: Secondary | ICD-10-CM

## 2022-09-12 DIAGNOSIS — D500 Iron deficiency anemia secondary to blood loss (chronic): Secondary | ICD-10-CM | POA: Diagnosis present

## 2022-09-12 DIAGNOSIS — Z66 Do not resuscitate: Secondary | ICD-10-CM | POA: Diagnosis present

## 2022-09-12 DIAGNOSIS — I2510 Atherosclerotic heart disease of native coronary artery without angina pectoris: Secondary | ICD-10-CM | POA: Diagnosis present

## 2022-09-12 DIAGNOSIS — I340 Nonrheumatic mitral (valve) insufficiency: Secondary | ICD-10-CM | POA: Diagnosis present

## 2022-09-12 DIAGNOSIS — E1122 Type 2 diabetes mellitus with diabetic chronic kidney disease: Secondary | ICD-10-CM | POA: Diagnosis not present

## 2022-09-12 DIAGNOSIS — I350 Nonrheumatic aortic (valve) stenosis: Secondary | ICD-10-CM | POA: Diagnosis not present

## 2022-09-12 DIAGNOSIS — I5033 Acute on chronic diastolic (congestive) heart failure: Secondary | ICD-10-CM

## 2022-09-12 DIAGNOSIS — I10 Essential (primary) hypertension: Secondary | ICD-10-CM | POA: Diagnosis present

## 2022-09-12 DIAGNOSIS — Z87891 Personal history of nicotine dependence: Secondary | ICD-10-CM | POA: Diagnosis not present

## 2022-09-12 DIAGNOSIS — E785 Hyperlipidemia, unspecified: Secondary | ICD-10-CM | POA: Diagnosis present

## 2022-09-12 DIAGNOSIS — I214 Non-ST elevation (NSTEMI) myocardial infarction: Secondary | ICD-10-CM | POA: Diagnosis present

## 2022-09-12 DIAGNOSIS — Z1152 Encounter for screening for COVID-19: Secondary | ICD-10-CM

## 2022-09-12 DIAGNOSIS — E871 Hypo-osmolality and hyponatremia: Secondary | ICD-10-CM | POA: Diagnosis not present

## 2022-09-12 DIAGNOSIS — M109 Gout, unspecified: Secondary | ICD-10-CM | POA: Diagnosis present

## 2022-09-12 DIAGNOSIS — I360 Nonrheumatic tricuspid (valve) stenosis: Secondary | ICD-10-CM | POA: Diagnosis present

## 2022-09-12 DIAGNOSIS — R0602 Shortness of breath: Secondary | ICD-10-CM | POA: Diagnosis not present

## 2022-09-12 DIAGNOSIS — D62 Acute posthemorrhagic anemia: Secondary | ICD-10-CM | POA: Diagnosis not present

## 2022-09-12 DIAGNOSIS — Z886 Allergy status to analgesic agent status: Secondary | ICD-10-CM | POA: Diagnosis not present

## 2022-09-12 DIAGNOSIS — Z79899 Other long term (current) drug therapy: Secondary | ICD-10-CM

## 2022-09-12 DIAGNOSIS — I509 Heart failure, unspecified: Secondary | ICD-10-CM | POA: Diagnosis not present

## 2022-09-12 DIAGNOSIS — Z888 Allergy status to other drugs, medicaments and biological substances status: Secondary | ICD-10-CM

## 2022-09-12 DIAGNOSIS — Z961 Presence of intraocular lens: Secondary | ICD-10-CM | POA: Diagnosis present

## 2022-09-12 DIAGNOSIS — G2581 Restless legs syndrome: Secondary | ICD-10-CM | POA: Diagnosis present

## 2022-09-12 DIAGNOSIS — R059 Cough, unspecified: Secondary | ICD-10-CM | POA: Diagnosis not present

## 2022-09-12 DIAGNOSIS — I5030 Unspecified diastolic (congestive) heart failure: Secondary | ICD-10-CM | POA: Diagnosis not present

## 2022-09-12 DIAGNOSIS — R069 Unspecified abnormalities of breathing: Secondary | ICD-10-CM | POA: Diagnosis not present

## 2022-09-12 DIAGNOSIS — J40 Bronchitis, not specified as acute or chronic: Secondary | ICD-10-CM

## 2022-09-12 DIAGNOSIS — K31819 Angiodysplasia of stomach and duodenum without bleeding: Secondary | ICD-10-CM | POA: Diagnosis not present

## 2022-09-12 DIAGNOSIS — I130 Hypertensive heart and chronic kidney disease with heart failure and stage 1 through stage 4 chronic kidney disease, or unspecified chronic kidney disease: Principal | ICD-10-CM | POA: Diagnosis present

## 2022-09-12 DIAGNOSIS — K219 Gastro-esophageal reflux disease without esophagitis: Secondary | ICD-10-CM | POA: Diagnosis present

## 2022-09-12 DIAGNOSIS — J90 Pleural effusion, not elsewhere classified: Secondary | ICD-10-CM | POA: Diagnosis not present

## 2022-09-12 DIAGNOSIS — M10272 Drug-induced gout, left ankle and foot: Secondary | ICD-10-CM | POA: Diagnosis not present

## 2022-09-12 DIAGNOSIS — J449 Chronic obstructive pulmonary disease, unspecified: Secondary | ICD-10-CM | POA: Diagnosis present

## 2022-09-12 DIAGNOSIS — I110 Hypertensive heart disease with heart failure: Secondary | ICD-10-CM | POA: Diagnosis not present

## 2022-09-12 LAB — CBC WITH DIFFERENTIAL/PLATELET
Abs Immature Granulocytes: 0.05 10*3/uL (ref 0.00–0.07)
Basophils Absolute: 0.1 10*3/uL (ref 0.0–0.1)
Basophils Relative: 1 %
Eosinophils Absolute: 0.1 10*3/uL (ref 0.0–0.5)
Eosinophils Relative: 1 %
HCT: 26 % — ABNORMAL LOW (ref 36.0–46.0)
Hemoglobin: 7.9 g/dL — ABNORMAL LOW (ref 12.0–15.0)
Immature Granulocytes: 1 %
Lymphocytes Relative: 8 %
Lymphs Abs: 0.8 10*3/uL (ref 0.7–4.0)
MCH: 32.6 pg (ref 26.0–34.0)
MCHC: 30.4 g/dL (ref 30.0–36.0)
MCV: 107.4 fL — ABNORMAL HIGH (ref 80.0–100.0)
Monocytes Absolute: 0.5 10*3/uL (ref 0.1–1.0)
Monocytes Relative: 5 %
Neutro Abs: 8.4 10*3/uL — ABNORMAL HIGH (ref 1.7–7.7)
Neutrophils Relative %: 84 %
Platelets: 221 10*3/uL (ref 150–400)
RBC: 2.42 MIL/uL — ABNORMAL LOW (ref 3.87–5.11)
RDW: 13.6 % (ref 11.5–15.5)
WBC: 10 10*3/uL (ref 4.0–10.5)
nRBC: 0 % (ref 0.0–0.2)

## 2022-09-12 LAB — COMPREHENSIVE METABOLIC PANEL
ALT: 16 U/L (ref 0–44)
AST: 20 U/L (ref 15–41)
Albumin: 3.7 g/dL (ref 3.5–5.0)
Alkaline Phosphatase: 50 U/L (ref 38–126)
Anion gap: 12 (ref 5–15)
BUN: 44 mg/dL — ABNORMAL HIGH (ref 8–23)
CO2: 24 mmol/L (ref 22–32)
Calcium: 9 mg/dL (ref 8.9–10.3)
Chloride: 98 mmol/L (ref 98–111)
Creatinine, Ser: 2.27 mg/dL — ABNORMAL HIGH (ref 0.44–1.00)
GFR, Estimated: 20 mL/min — ABNORMAL LOW (ref >60)
Glucose, Bld: 188 mg/dL — ABNORMAL HIGH (ref 70–99)
Potassium: 3.8 mmol/L (ref 3.5–5.1)
Sodium: 134 mmol/L — ABNORMAL LOW (ref 135–145)
Total Bilirubin: 0.7 mg/dL (ref 0.3–1.2)
Total Protein: 7 g/dL (ref 6.5–8.1)

## 2022-09-12 LAB — PROTIME-INR
INR: 1.1 (ref 0.8–1.2)
Prothrombin Time: 14 seconds (ref 11.4–15.2)

## 2022-09-12 LAB — BLOOD GAS, VENOUS
Acid-Base Excess: 1 mmol/L (ref 0.0–2.0)
Bicarbonate: 26 mmol/L (ref 20.0–28.0)
O2 Saturation: 60.7 %
Patient temperature: 37
pCO2, Ven: 42 mmHg — ABNORMAL LOW (ref 44–60)
pH, Ven: 7.4 (ref 7.25–7.43)
pO2, Ven: 39 mmHg (ref 32–45)

## 2022-09-12 LAB — TROPONIN I (HIGH SENSITIVITY)
Troponin I (High Sensitivity): 40 ng/L — ABNORMAL HIGH (ref <18)
Troponin I (High Sensitivity): 344 ng/L (ref <18)
Troponin I (High Sensitivity): 1993 ng/L (ref <18)

## 2022-09-12 LAB — APTT: aPTT: >200 seconds (ref 24–36)

## 2022-09-12 LAB — RESP PANEL BY RT-PCR (RSV, FLU A&B, COVID)  RVPGX2
Influenza A by PCR: NEGATIVE
Influenza B by PCR: NEGATIVE
Resp Syncytial Virus by PCR: NEGATIVE
SARS Coronavirus 2 by RT PCR: NEGATIVE

## 2022-09-12 LAB — BRAIN NATRIURETIC PEPTIDE: B Natriuretic Peptide: 825.8 pg/mL — ABNORMAL HIGH (ref 0.0–100.0)

## 2022-09-12 LAB — IRON AND TIBC
Iron: 17 ug/dL — ABNORMAL LOW (ref 28–170)
Saturation Ratios: 4 % — ABNORMAL LOW (ref 10.4–31.8)
TIBC: 385 ug/dL (ref 250–450)
UIBC: 368 ug/dL

## 2022-09-12 LAB — VITAMIN B12: Vitamin B-12: 289 pg/mL (ref 180–914)

## 2022-09-12 LAB — LACTIC ACID, PLASMA
Lactic Acid, Venous: 1.3 mmol/L (ref 0.5–1.9)
Lactic Acid, Venous: 0.8 mmol/L (ref 0.5–1.9)

## 2022-09-12 LAB — LIPASE, BLOOD: Lipase: 35 U/L (ref 11–51)

## 2022-09-12 MED ORDER — DOXYCYCLINE HYCLATE 100 MG PO TABS
100.0000 mg | ORAL_TABLET | Freq: Two times a day (BID) | ORAL | Status: DC
  Administered 2022-09-12 – 2022-09-16 (×8): 100 mg via ORAL
  Filled 2022-09-12 (×8): qty 1

## 2022-09-12 MED ORDER — ASPIRIN 81 MG PO TBEC
81.0000 mg | DELAYED_RELEASE_TABLET | Freq: Every day | ORAL | Status: DC
  Administered 2022-09-12 – 2022-09-16 (×5): 81 mg via ORAL
  Filled 2022-09-12 (×5): qty 1

## 2022-09-12 MED ORDER — FUROSEMIDE 10 MG/ML IJ SOLN
40.0000 mg | Freq: Once | INTRAMUSCULAR | Status: AC
  Administered 2022-09-12: 40 mg via INTRAVENOUS
  Filled 2022-09-12: qty 4

## 2022-09-12 MED ORDER — HEPARIN SODIUM (PORCINE) 5000 UNIT/ML IJ SOLN
5000.0000 [IU] | Freq: Two times a day (BID) | INTRAMUSCULAR | Status: DC
  Administered 2022-09-12: 5000 [IU] via SUBCUTANEOUS
  Filled 2022-09-12: qty 1

## 2022-09-12 MED ORDER — SODIUM CHLORIDE 0.9 % IV SOLN
500.0000 mg | Freq: Once | INTRAVENOUS | Status: AC
  Administered 2022-09-12: 500 mg via INTRAVENOUS
  Filled 2022-09-12: qty 5

## 2022-09-12 MED ORDER — ACETAMINOPHEN 325 MG PO TABS
650.0000 mg | ORAL_TABLET | ORAL | Status: DC | PRN
  Administered 2022-09-14 – 2022-09-15 (×2): 650 mg via ORAL
  Filled 2022-09-12 (×2): qty 2

## 2022-09-12 MED ORDER — ROSUVASTATIN CALCIUM 5 MG PO TABS
5.0000 mg | ORAL_TABLET | Freq: Every day | ORAL | Status: DC
  Administered 2022-09-12 – 2022-09-14 (×3): 5 mg via ORAL
  Filled 2022-09-12 (×3): qty 1

## 2022-09-12 MED ORDER — SODIUM CHLORIDE 0.9 % IV SOLN
1.0000 g | Freq: Once | INTRAVENOUS | Status: AC
  Administered 2022-09-12: 1 g via INTRAVENOUS
  Filled 2022-09-12: qty 10

## 2022-09-12 MED ORDER — HEPARIN (PORCINE) 25000 UT/250ML-% IV SOLN
1050.0000 [IU]/h | INTRAVENOUS | Status: AC
  Administered 2022-09-12: 700 [IU]/h via INTRAVENOUS
  Administered 2022-09-13: 850 [IU]/h via INTRAVENOUS
  Filled 2022-09-12 (×2): qty 250

## 2022-09-12 MED ORDER — PANTOPRAZOLE SODIUM 40 MG PO TBEC
40.0000 mg | DELAYED_RELEASE_TABLET | Freq: Every day | ORAL | Status: DC
  Administered 2022-09-12 – 2022-09-16 (×5): 40 mg via ORAL
  Filled 2022-09-12 (×5): qty 1

## 2022-09-12 MED ORDER — FERROUS SULFATE 325 (65 FE) MG PO TABS
325.0000 mg | ORAL_TABLET | Freq: Every day | ORAL | Status: DC
  Administered 2022-09-12 – 2022-09-15 (×4): 325 mg via ORAL
  Filled 2022-09-12 (×4): qty 1

## 2022-09-12 MED ORDER — CARVEDILOL 3.125 MG PO TABS
3.1250 mg | ORAL_TABLET | Freq: Two times a day (BID) | ORAL | Status: DC
  Administered 2022-09-12 – 2022-09-16 (×9): 3.125 mg via ORAL
  Filled 2022-09-12 (×9): qty 1

## 2022-09-12 MED ORDER — ONDANSETRON HCL 4 MG/2ML IJ SOLN: 4.0000 mg | Freq: Four times a day (QID) | INTRAMUSCULAR | Status: DC | PRN

## 2022-09-12 MED ORDER — HEPARIN BOLUS VIA INFUSION
3500.0000 [IU] | Freq: Once | INTRAVENOUS | Status: AC
  Administered 2022-09-12: 3500 [IU] via INTRAVENOUS
  Filled 2022-09-12: qty 3500

## 2022-09-12 MED ORDER — MELATONIN 5 MG PO TABS
10.0000 mg | ORAL_TABLET | Freq: Every day | ORAL | Status: DC
  Administered 2022-09-12 – 2022-09-15 (×4): 10 mg via ORAL
  Filled 2022-09-12 (×4): qty 2

## 2022-09-12 MED ORDER — TRAZODONE HCL 50 MG PO TABS
50.0000 mg | ORAL_TABLET | Freq: Every day | ORAL | Status: DC
  Administered 2022-09-12 – 2022-09-15 (×4): 50 mg via ORAL
  Filled 2022-09-12 (×4): qty 1

## 2022-09-12 MED ORDER — SODIUM CHLORIDE 0.9% FLUSH
3.0000 mL | Freq: Two times a day (BID) | INTRAVENOUS | Status: DC
  Administered 2022-09-13 – 2022-09-16 (×7): 3 mL via INTRAVENOUS

## 2022-09-12 MED ORDER — SODIUM CHLORIDE 0.9 % IV SOLN: 250.0000 mL | INTRAVENOUS | Status: DC | PRN

## 2022-09-12 MED ORDER — SODIUM CHLORIDE 0.9% FLUSH: 3.0000 mL | INTRAVENOUS | Status: DC | PRN

## 2022-09-12 MED ORDER — ISOSORBIDE MONONITRATE ER 60 MG PO TB24
60.0000 mg | ORAL_TABLET | Freq: Every day | ORAL | Status: DC
  Administered 2022-09-12 – 2022-09-16 (×5): 60 mg via ORAL
  Filled 2022-09-12 (×5): qty 1

## 2022-09-12 MED ORDER — HYDRALAZINE HCL 10 MG PO TABS: 10.0000 mg | ORAL_TABLET | Freq: Four times a day (QID) | ORAL | Status: DC | PRN

## 2022-09-12 MED ORDER — FUROSEMIDE 10 MG/ML IJ SOLN
40.0000 mg | Freq: Two times a day (BID) | INTRAMUSCULAR | Status: DC
  Administered 2022-09-12 – 2022-09-16 (×8): 40 mg via INTRAVENOUS
  Filled 2022-09-12 (×8): qty 4

## 2022-09-12 NOTE — ED Notes (Signed)
Pt is satting at 85% with a good pleth wave. Writer has her sitting upright with a non rebreather and she is now satting at 93-95%. This was after I had her cough and deep breathe. MD messaged about this. Resp. Are nonlabored and rate is normal.

## 2022-09-12 NOTE — ED Provider Notes (Addendum)
[facility] Provider Note    Event Date/Time   First MD Initiated Contact with Patient 09/12/22 [phone]     (approximate)   History   Shortness of Breath   HPI  Lisa Mcneil is a 89 y.o. female   Past medical history of CHF, diabetes, GERD, hypertension, iron deficiency anemia who presents to the emergency department with 1 week of dry cough and then last night developed shortness of breath while sleeping.  Denies chest pain, denies chest tightness, denies abdominal pain nausea vomiting.  No other acute medical complaints.  She does not wear home O2.  Room air saturations went as low as 73%, improved on nasal cannula oxygen at liters weaned down to 4 L  Dry cough for 1 week preceding without fevers or chills.  No nasal congestion sore throat or other URI symptoms.  Independent Historian contributed to assessment above: Husband  External Medical Documents Reviewed: Discharge summary from 06/28/2022 admitted to the hospital with chest pain and diagnosed with acute on chronic diastolic congestive heart failure underwent diuresis and had elevated troponins that were medically managed.      Physical Exam   Triage Vital Signs: ED Triage Vitals  Enc Vitals Group     BP 09/12/22 0646 (!) 134/42     Pulse Rate 09/12/22 0646 87     Resp 09/12/22 0646 (!) 26     Temp 09/12/22 0646 97.6 F (36.4 C)     Temp Source 09/12/22 0646 Oral     SpO2 09/12/22 0646 (!) 73 %     Weight 09/12/22 0650 130 lb (59 kg)     Height 09/12/22 0650 5\' 1"  (1.549 m)     Head Circumference --      Peak Flow --      Pain Score --      Pain Loc --      Pain Edu? --      Excl. in [place]? --     Most recent vital signs: Vitals:   09/12/22 0700 09/12/22 0715  BP: (!) 116/37   Pulse: 76 76  Resp: (!) 21 (!) 26  Temp:    SpO2: 95% 95%    General: Awake, no distress.  CV:  Good peripheral perfusion.  Resp:  Normal effort.  Abd:  No distention.  Other:  Hypoxemia 73% on  room air initially started at 6 L with improvement to 99%, weaned down to 4 L nasal cannula maintaining saturations in the mid 90%.  No respiratory distress, mild tachypnea in the 20s, rales to mid lungs bilaterally with no focality, oropharynx appears normal, neck is supple with full range of motion abdomen soft and nontender and skin appears warm well-perfused.  She has no obvious pitting edema to her legs   ED Results / Procedures / Treatments   Labs (all labs ordered are listed, but only abnormal results are displayed) Labs Reviewed  COMPREHENSIVE METABOLIC PANEL - Abnormal; Notable for the following components:      Result Value   Sodium 134 (*)    Glucose, Bld 188 (*)    BUN 44 (*)    Creatinine, Ser 2.27 (*)    GFR, Estimated 20 (*)    All other components within normal limits  CBC WITH DIFFERENTIAL/PLATELET - Abnormal; Notable for the following components:   RBC 2.42 (*)    Hemoglobin 7.9 (*)    HCT 26.0 (*)    MCV 107.4 (*)    Neutro Abs 8.4 (*)  All other components within normal limits  BRAIN NATRIURETIC PEPTIDE - Abnormal; Notable for the following components:   B Natriuretic Peptide 825.8 (*)    All other components within normal limits  BLOOD GAS, VENOUS - Abnormal; Notable for the following components:   pCO2, Ven 42 (*)    All other components within normal limits  TROPONIN I (HIGH SENSITIVITY) - Abnormal; Notable for the following components:   Troponin I (High Sensitivity) 40 (*)    All other components within normal limits  RESP PANEL BY RT-PCR (RSV, FLU A&B, COVID)  RVPGX2  CULTURE, BLOOD (SINGLE)  LACTIC ACID, PLASMA  PROTIME-INR  LIPASE, BLOOD  LACTIC ACID, PLASMA     I ordered and reviewed the above labs they are notable for she has a creatinine of 2.2 (baseline 1.7- low 2s in 2022-2023) and hemoglobin of 7.9 (baseline ranges from high sevens to high eights recently is microcytic today with an MCV of 107.4)  EKG  ED ECG REPORT I, Lucillie Garfinkel, the  attending physician, personally viewed and interpreted this ECG.   Date: 09/12/2022  EKG Time: 0648  Rate: 86  Rhythm: sinus   Axis: nl  Intervals:none  ST&T Change: STD inferolateral leads (apparent in ECG from Jul 09, 2023 but slightly more pronounced today)    RADIOLOGY I independently reviewed and interpreted chest x-ray and see diffuse opacifications consistent with pulmonary edema   PROCEDURES:  Critical Care performed: Yes, see critical care procedure note(s)  .Critical Care  Performed by: Lucillie Garfinkel, MD Authorized by: Lucillie Garfinkel, MD   Critical care provider statement:    Critical care time (minutes):  30   Critical care was necessary to treat or prevent imminent or life-threatening deterioration of the following conditions:  Respiratory failure   Critical care was time spent personally by me on the following activities:  Development of treatment plan with patient or surrogate, discussions with consultants, evaluation of patient's response to treatment, examination of patient, ordering and review of laboratory studies, ordering and review of radiographic studies, ordering and performing treatments and interventions, pulse oximetry, re-evaluation of patient's condition and review of old charts    MEDICATIONS ORDERED IN ED: Medications  furosemide (LASIX) injection 40 mg (has no administration in time range)  cefTRIAXone (ROCEPHIN) 1 g in sodium chloride 0.9 % 100 mL IVPB (has no administration in time range)  azithromycin (ZITHROMAX) 500 mg in sodium chloride 0.9 % 250 mL IVPB (has no administration in time range)    External physician / consultants:  I spoke with hospitalist for admission and regarding care plan for this patient.   IMPRESSION / MDM / ASSESSMENT AND PLAN / ED COURSE  I reviewed the triage vital signs and the nursing notes.                                Patient's presentation is most consistent with acute presentation with potential threat to life or  bodily function.  Differential diagnosis includes, but is not limited to, heart failure exacerbation, viral infection, bacterial pneumonia, ACS, PE, bronchitis, sepsis   The patient is on the cardiac monitor to evaluate for evidence of arrhythmia and/or significant heart rate changes.  MDM: Patient with heart failure with shortness of breath starting last night and a preceding cough concern for infection versus CHF exacerbation.  Has rales and an elevated BNP with pulmonary edema appearing infiltrates on chest x-ray will give IV Lasix 40 mg.  Check viral swabs.  White blood cell count is normal.  Questionable focal consolidation on chest x-ray noted by radiologist as well, will cover with community-acquired pneumonia coverage as well.    She has new oxygen requirement, admission.  Initial trop 40 will get serial; ecg no stemi but w std inferolateral (not new) and no chest pain.          FINAL CLINICAL IMPRESSION(S) / ED DIAGNOSES   Final diagnoses:  None     Rx / DC Orders   ED Discharge Orders     None        Note:  This document was prepared using Dragon voice recognition software and may include unintentional dictation errors.    Lucillie Garfinkel, MD 09/12/22 5110    Lucillie Garfinkel, MD 09/12/22 [phone]

## 2022-09-12 NOTE — Progress Notes (Signed)
Nurse reported that patient has desat and I went to see the patient. Patient appears to be lethargic compared to this morning, continue to have B/L carckles, though she put out about 700 ml urine after the one dose of IV Lasix.  Decided to switch her to BIPAP, and upgrade to PCU for close monitoring  Trop trending 40>340, but denied chest pain and repeat EKG showed no new ST-T change. Continue BIPAP and will repeat Trop at 16:00.

## 2022-09-12 NOTE — Consult Note (Addendum)
ANTICOAGULATION CONSULT NOTE - Initial Consult  Pharmacy Consult for heparin Indication: chest pain/ACS  Allergies  Allergen Reactions   Atorvastatin Other (See Comments)   Codeine    Demerol [Meperidine]    Polysaccharide Iron Complex Other (See Comments)    Caused constipation   Naproxen Rash    Caused rash on mouth    Patient Measurements: Height: 5\' 1"  (154.9 cm) Weight: 66 kg (145 lb 8.1 oz) IBW/kg (Calculated) : 47.8 Heparin Dosing Weight: 59 kg  Vital Signs: Temp: 95.7 F (35.4 C) (02/02 1358) Temp Source: Oral (02/02 1358) BP: 115/51 (02/02 1651) Pulse Rate: 72 (02/02 1651)  Labs: Recent Labs    09/12/22 0656 09/12/22 1124 09/12/22 1500  HGB 7.9*  --   --   HCT 26.0*  --   --   PLT 221  --   --   LABPROT 14.0  --   --   INR 1.1  --   --   CREATININE 2.27*  --   --   TROPONINIHS 40* 344* 1,993*    Estimated Creatinine Clearance: 13.8 mL/min (A) (by C-G formula based on SCr of 2.27 mg/dL (H)).   Medical History: Past Medical History:  Diagnosis Date   CHF (congestive heart failure) (HCC)    Diabetes mellitus without complication (HCC)    GERD (gastroesophageal reflux disease)    Hypertension    Insomnia    Iron deficiency anemia    RLS (restless legs syndrome)     Medications:  No evidence of anticoagulant use PTA Pt was receiving heparin 5000u SQ Q12H for DVT ppx (last dose 2/2 @ 8887)  Assessment: Pharmacy consulted to dose heparin in this 89 yo F with troponin elevated from 40 >> 1993.  CrCl 13.8.    Baseline labs: baseline aPTT >200 (bolus/drip hung @1700  and aPTT drawn at 1736) INR 1.1, Hgb 7.9, Plts 221  Goal of Therapy:  Heparin level 0.3-0.7 units/ml Monitor platelets by anticoagulation protocol: Yes   Plan:  Give 3500 units bolus x 1 Start heparin infusion at 700 units/hr Check anti-Xa level in 8 hours and daily while on heparin Continue to monitor H&H and platelets  Alison Murray 09/12/2022,4:56 PM

## 2022-09-12 NOTE — Progress Notes (Signed)
Patient  on Heparin drip at 7 cc/hr . Her aptt is  came >200 . Notified Dr. Damita Dunnings and the Pharmacist. Per Pharmacist, no intervention needed at this time and will redraw aptt later.

## 2022-09-12 NOTE — H&P (Signed)
History and Physical    Lisa Mcneil:[id number] DOB: [birth year] DOA: 09/12/2022  PCP: Dion Body, MD (Confirm with patient/family/NH records and if not entered, this has to be entered at [facility] point of entry) Patient coming from: Home  I have personally briefly reviewed patient's old medical records in [place]  Chief Complaint: cough, SOB  HPI: Lisa Mcneil is a 89 y.o. female with medical history significant of history of chronic HFpEF secondary to moderate-severe AS and moderate MR, CAD, chronic iron deficiency anemia, HTN, CKD stage IV, IIDM, came for worsening of cough and shortness of breath.  Symptoms started 5 days ago, with initial exertional dyspnea progressively getting worse, in addition, she also has had productive cough with vith white thin phlegm for the last 3 to 4 days, denies any chest pain no fever chills no significant peripheral swelling.  Last night she also developed orthopnea and could not lie flat.  ED Course: Patient was found to be in respiratory distress and hypoxia 73% O2 saturation on room air, blood pressure stable, chest x-ray showed bilateral pulmonary congestion with suspected left upper infiltrates.  And patient was started on antibiotics and IV Lasix in the ED. Review of Systems: As per HPI otherwise 14 point review of systems negative.   Past Medical History:  Diagnosis Date   CHF (congestive heart failure) (HCC)    Diabetes mellitus without complication (HCC)    GERD (gastroesophageal reflux disease)    Hypertension    Insomnia    Iron deficiency anemia    RLS (restless legs syndrome)     Past Surgical History:  Procedure Laterality Date   BLADDER REPAIR     CATARACT EXTRACTION W/PHACO Right 05/13/2016   Procedure: CATARACT EXTRACTION PHACO AND INTRAOCULAR LENS PLACEMENT ([place]);  Surgeon: Birder Robson, MD;  Location: [facility] ORS;  Service: Ophthalmology;  Laterality: Right;  [place] 1.09 AP% 21.0 CDE 14.60 Fluid Pack  Lot # [id number]   CATARACT EXTRACTION W/PHACO Left 07/15/2016   Procedure: CATARACT EXTRACTION PHACO AND INTRAOCULAR LENS PLACEMENT (I[facility]);  Surgeon: Birder Robson, MD;  Location: [facility] ORS;  Service: Ophthalmology;  Laterality: Left;  Lot# [id number] H [place]: 01:17.9 AP%:27.8 CDE: 21.67      reports that she has quit smoking. She has never used smokeless tobacco. She reports that she does not drink alcohol and does not use drugs.  Allergies  Allergen Reactions   Atorvastatin Other (See Comments)   Codeine    Demerol [Meperidine]    Polysaccharide Iron Complex Other (See Comments)    Caused constipation   Naproxen Rash    Caused rash on mouth    No family history on file.   Prior to Admission medications   Medication Sig Start Date End Date Taking? Authorizing Provider  amLODipine (NORVASC) 10 MG tablet Take 10 mg by mouth daily.    [provider]  aspirin EC 81 MG tablet Take 1 tablet (81 mg total) by mouth daily. Swallow whole. 06/15/22   Lorella Nimrod, MD  estradiol (ESTRACE) 0.1 MG/GM vaginal cream SMARTSIG:Gram(s) Vaginal Twice a Week 05/09/22   [provider]  ferrous sulfate 325 (65 FE) MG tablet Take 325 mg by mouth daily.    [provider]  furosemide (LASIX) 40 MG tablet Take 1 tablet (40 mg total) by mouth 2 (two) times daily. 06/28/22 06/28/23  Loletha Grayer, MD  gabapentin (NEURONTIN) 100 MG capsule Take 100 mg by mouth at bedtime.    [provider]  isosorbide mononitrate (IMDUR) 60 MG  24 hr tablet Take 1 tablet (60 mg total) by mouth daily. 06/29/22   Loletha Grayer, MD  Melatonin 10 MG TABS Take 10 mg by mouth at bedtime.    [provider]  metoprolol tartrate (LOPRESSOR) 25 MG tablet Take 1 tablet (25 mg total) by mouth 2 (two) times daily. 06/28/22   Loletha Grayer, MD  Multiple Vitamins-Minerals (MULTIVITAMIN WITH MINERALS) tablet Take 1 tablet by mouth daily.    [provider]  nitroGLYCERIN (NITROSTAT)  0.4 MG SL tablet Place 1 tablet (0.4 mg total) under the tongue every 5 (five) minutes as needed for chest pain. 06/15/22   Lorella Nimrod, MD  omeprazole (PRILOSEC) 20 MG capsule Take 20 mg by mouth 2 (two) times daily.    [provider]  pramipexole (MIRAPEX) 0.25 MG tablet Take 0.25 mg by mouth in the morning and at bedtime. 06/16/22   [provider]  rosuvastatin (CRESTOR) 5 MG tablet Take 5 mg by mouth at bedtime.    [provider]  traZODone (DESYREL) 50 MG tablet Take 50 mg by mouth at bedtime.    [provider]    Physical Exam: Vitals:   09/12/22 0715 09/12/22 0730 09/12/22 0800 09/12/22 0830  BP:  (!) 116/46 (!) 123/35 (!) 125/40  Pulse: 76 74 75 72  Resp: (!) 26 (!) 31 (!) 22 18  Temp:      TempSrc:      SpO2: 95% 99% 95% 92%  Weight:      Height:        Constitutional: NAD, calm, comfortable Vitals:   09/12/22 0715 09/12/22 0730 09/12/22 0800 09/12/22 0830  BP:  (!) 116/46 (!) 123/35 (!) 125/40  Pulse: 76 74 75 72  Resp: (!) 26 (!) 31 (!) 22 18  Temp:      TempSrc:      SpO2: 95% 99% 95% 92%  Weight:      Height:       Eyes: PERRL, lids and conjunctivae normal ENMT: Mucous membranes are moist. Posterior pharynx clear of any exudate or lesions.Normal dentition.  Neck: normal, supple, no masses, no thyromegaly Respiratory: clear to auscultation bilaterally, no wheezing, B/L crackles fine crackles to mid levels. Increasing respiratory effort. No accessory muscle use.  Cardiovascular: Regular rate and rhythm, loud systolic murmur on heart base, diminished S2. No extremity edema. 2+ pedal pulses. No carotid bruits.  Abdomen: no tenderness, no masses palpated. No hepatosplenomegaly. Bowel sounds positive.  Musculoskeletal: no clubbing / cyanosis. No joint deformity upper and lower extremities. Good ROM, no contractures. Normal muscle tone.  Skin: no rashes, lesions, ulcers. No induration Neurologic: CN 2-12 grossly intact. Sensation  intact, DTR normal. Strength 5/5 in all 4.  Psychiatric: Normal judgment and insight. Alert and oriented x 3. Normal mood.    Labs on Admission: I have personally reviewed following labs and imaging studies  CBC: Recent Labs  Lab 09/12/22 0656  WBC 10.0  NEUTROABS 8.4*  HGB 7.9*  HCT 26.0*  MCV 107.4*  PLT 786   Basic Metabolic Panel: Recent Labs  Lab 09/12/22 0656  NA 134*  K 3.8  CL 98  CO2 24  GLUCOSE 188*  BUN 44*  CREATININE 2.27*  CALCIUM 9.0   GFR: Estimated Creatinine Clearance: 13.1 mL/min (A) (by C-G formula based on SCr of 2.27 mg/dL (H)). Liver Function Tests: Recent Labs  Lab 09/12/22 0656  AST 20  ALT 16  ALKPHOS 50  BILITOT 0.7  PROT 7.0  ALBUMIN 3.7  Recent Labs  Lab 09/12/22 0656  LIPASE 35   No results for input(s): "AMMONIA" in the last 168 hours. Coagulation Profile: Recent Labs  Lab 09/12/22 0656  INR 1.1   Cardiac Enzymes: No results for input(s): "CKTOTAL", "CKMB", "CKMBINDEX", "TROPONINI" in the last 168 hours. BNP (last 3 results) No results for input(s): "PROBNP" in the last 8760 hours. HbA1C: No results for input(s): "HGBA1C" in the last 72 hours. CBG: No results for input(s): "GLUCAP" in the last 168 hours. Lipid Profile: No results for input(s): "CHOL", "HDL", "LDLCALC", "TRIG", "CHOLHDL", "LDLDIRECT" in the last 72 hours. Thyroid Function Tests: No results for input(s): "TSH", "T4TOTAL", "FREET4", "T3FREE", "THYROIDAB" in the last 72 hours. Anemia Panel: No results for input(s): "VITAMINB12", "FOLATE", "FERRITIN", "TIBC", "IRON", "RETICCTPCT" in the last 72 hours. Urine analysis:    Component Value Date/Time   COLORURINE STRAW (A) 06/13/2022 1425   APPEARANCEUR CLEAR (A) 06/13/2022 1425   APPEARANCEUR Cloudy 06/23/2014 2320   LABSPEC 1.010 06/13/2022 1425   LABSPEC 1.015 06/23/2014 2320   PHURINE 5.0 06/13/2022 1425   GLUCOSEU NEGATIVE 06/13/2022 1425   GLUCOSEU Negative 06/23/2014 2320   HGBUR NEGATIVE  06/13/2022 1425   BILIRUBINUR NEGATIVE 06/13/2022 1425   BILIRUBINUR Negative 06/23/2014 2320   KETONESUR NEGATIVE 06/13/2022 1425   PROTEINUR NEGATIVE 06/13/2022 1425   NITRITE NEGATIVE 06/13/2022 1425   LEUKOCYTESUR SMALL (A) 06/13/2022 1425   LEUKOCYTESUR 3+ 06/23/2014 2320    Radiological Exams on Admission: DG Chest Port 1 View  Result Date: 09/12/2022 CLINICAL DATA:  Cough and hypoxia.  Shortness of breath. EXAM: PORTABLE CHEST 1 VIEW COMPARISON:  06/26/2022 FINDINGS: The cardio pericardial silhouette is enlarged. Diffuse interstitial opacity again noted suggesting edema. There is somewhat focal airspace disease in the right parahilar lung with a nodular opacity in the left mid lung. Tiny bilateral pleural effusions. Bones are diffusely demineralized. Telemetry leads overlie the chest. IMPRESSION: 1. Persistent interstitial opacity suggesting edema. 2. Somewhat focal airspace disease in the right parahilar lung with a nodular opacity in the left mid lung. These findings may reflect areas of asymmetric edema or multifocal infection. Given the nodular character to the left-sided opacity, follow-up is recommended to ensure resolution. Reviewing chest CTA 06/13/2022, no left nodule or mass in this region on that study. Electronically Signed   By: Misty Stanley M.D.   On: 09/12/2022 07:21    EKG: Independently reviewed.  Sinus, similar ST depressions on 2 and 3, and leads V4-6 and ST elevation on aVR similar to previous EKGs in December last year.  Assessment/Plan Principal Problem:   CHF (congestive heart failure) (HCC) Active Problems:   Acute on chronic diastolic (congestive) heart failure (HCC)   Essential hypertension   CKD (chronic kidney disease) stage 4, GFR 15-29 ml/min (HCC)   Acute CHF (congestive heart failure) ([place])  (please populate well all problems here in Problem List. (For example, if patient is on BP meds at home and you resume or decide to hold them, it is a problem that  needs to be her. Same for CAD, COPD, HLD and so on)  Acute hypoxic respiratory failure -Secondary to acute on chronic HFpEF/moderate-severe aortic stenosis. -Fluid overload, received IV Lasix x 1 in the ED, continue IV Lasix twice daily, monitor chest x-ray and kidney function. -Long-term prognosis poor given the patient has repeated hospitalization for CHF decompensation secondary to severe AS in last 3 months, patient confirmed that she prefers conservative management, and patient is DNR.  According to her outpatient cardiologist  recent note, further cardiocath workup to characterize AS was postponed due to concerns about worsening of CKD.  CAP -Cannot rule out at this point, will give a short course of p.o. doxycycline.  Check atypical study and sputum culture  CKD stage IV -Significant fluid overload on physical exam and chest x-ray, diuresis as above. -Monitor kidney function daily  Chronic iron deficiency anemia -Hemoglobin 7.9 compared to 8.8 on last admission.  Denies any overt bleeding or black tarry stool.  Check iron study and B12 follow-up and reticulocyte count  HTN  -Hold off Norvasc as patient is in CHF decompensation -On IV Lasix, change metoprolol to Coreg for better BP control  GERD -On PPI    DVT prophylaxis: Heparin subcu Code Status: DNR Family Communication: None at bedside Disposition Plan: Patient is sick with CHF decompensation requiring inpatient IV diuresis while monitoring kidney function for her baseline CKD stage IV, expect more than 2 midnight hospital stay. Consults called: None Admission status: Tele admit   Lequita Halt MD [facility] Pager [phone]  09/12/2022, 9:29 AM

## 2022-09-12 NOTE — ED Notes (Signed)
EKG completed and given to Dr. Roosevelt Locks. Resp. Therapy here at bedside placed pt on BiPAP

## 2022-09-12 NOTE — ED Notes (Signed)
Pts 2nd trop was sent

## 2022-09-12 NOTE — ED Notes (Signed)
Dr. Roosevelt Locks messaged about trop of [phone]

## 2022-09-12 NOTE — ED Triage Notes (Signed)
Pt BIB EMS for cc of SOB. On scene Fire stated pt was 73 on room air. Pt placed on NRB and returned to high 90-100 o2 sats. Pt does not wear o2 at home. Pt in no distress on arrival.

## 2022-09-12 NOTE — Progress Notes (Signed)
Trending up of Trop 40>340>1900. Repeat EKG pending  D/W [facility] cardiology Dr. Saralyn Pilar, recommend heparin drip for 48 hours, continue Lasix BID. No cath as per cardiology. Echo ordered.  Family updated. Confirmed that patient is DNR.

## 2022-09-12 NOTE — ED Provider Triage Note (Signed)
Emergency Medicine Provider Triage Evaluation Note  Lisa Mcneil , a 89 y.o. female  was evaluated in triage.  Pt complains of productive cough and dyspnea.  Found to have spO2 in the 70s by first responders.  Lisa Mcneil O2 brought her up to 80s. Near 100% on NRB.  No pain, no fever.  Review of Systems  Positive: Cough, hypoxia, dyspnea  Negative: Fever, chest pain, abdominal pain, N/V/D  Physical Exam  There were no vitals taken for this visit. Gen:   Awake, no obvious respiratory distress on NRB. Resp:  Normal effort , no accessory muscle usage. MSK:   Moves extremities without difficulty .  No focal neuro deficits.  Medical Decision Making  Medically screening exam initiated at 6:45 AM.  Appropriate orders placed.  Lisa Mcneil was informed that the remainder of the evaluation will be completed by another provider, this initial triage assessment does not replace that evaluation, and the importance of remaining in the ED until their evaluation is complete.     Hinda Kehr, MD 09/12/22 [phone]

## 2022-09-12 NOTE — Discharge Instructions (Signed)

## 2022-09-12 NOTE — ED Notes (Signed)
Dr. Roosevelt Locks notified of pts troponin of 1993

## 2022-09-12 NOTE — Consult Note (Signed)
   Heart Failure Nurse Navigator Note'  HFpEF 60 to 65%.  Mild left ventricular hypertrophy.  Grade 1 diastolic dysfunction.  Normal right ventricular systolic function.  Mild to moderate mitral regurgitation.  Mild tricuspid stenosis.  Moderate to severe aortic stenosis.   She presented to the emergency room with complaints of dyspnea on exertion, productive cough and orthopnea.  Chest x-ray revealed bilateral pulmonary congestion.  BNP 825  Comorbidities:  Diabetes GERD Hypertension Iron deficiency anemia  Medications:  Aspirin 81 mg daily Carvedilol 3.125 mg 2 times a day with meals Isosorbide mononitrate 60 mg daily Crestor 5 mg daily Furosemide 40 mg IV 2 times a day  Labs:  Sodium 134, potassium 3.8, chloride 98, CO2 24, BUN 44, creatinine 2.27 up from 1.93 of 2 months ago, estimated GFR 20, hemoglobin 7.9 and hematocrit 26.  Initial meeting with patient in the emergency room, she states that she is feeling somewhat better.  Discussed  how she takes care of herself at home.  She states that she knows she should not drink over 64 ounces of fluids daily.  She does not add salt at the table.  States that she weighs herself daily and had not noted a weight gain at home.  Discussed reporting 2 pound weight gain overnight or a total of 5 pounds within the week.  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  Made aware that she had not shown up to her outpatient heart failure clinic appointments.  She now has 1 for February 7 at 2 PM.  She had no further questions.  Pricilla Riffle RN CHFN

## 2022-09-12 NOTE — Progress Notes (Signed)
Please inform patient that this is a shared room

## 2022-09-12 NOTE — Consult Note (Signed)
[facility] Cardiology  CARDIOLOGY CONSULT NOTE  Patient ID: Lisa Mcneil MRN: [id number] DOB/AGE: 87-Apr-1929 87 y.o.  Admit date: 09/12/2022 Referring Physician [place] Primary Physician [place] Primary Cardiologist [facility] Reason for Consultation NSTEMI  HPI: 89 year old female referred for evaluation of elevated troponin consistent with NSTEMI.  Patient has known history of chronic diastolic congestive heart failure, HFpEF, with moderate to severe aortic stenosis, and chronic kidney disease stage IV.  She presents with a day history of worsening shortness of breath, productive cough.  Patient noted to be hypoxic with oxygen saturation of 72% on room air.  X-ray revealed bilateral pulmonary congestion with suspected left upper infiltrates.  ECG reveals normal sinus rhythm at 91 bpm.  Admission labs notable for elevated troponin (40, 344, 1993).  The patient denies chest pain.  She reports her breathing has improved following admission and treatment with furosemide 40 mg IV twice daily with prompt diuresis.  She was started on heparin bolus with infusion.  Review of systems complete and found to be negative unless listed above     Past Medical History:  Diagnosis Date   CHF (congestive heart failure) (HCC)    Diabetes mellitus without complication (HCC)    GERD (gastroesophageal reflux disease)    Hypertension    Insomnia    Iron deficiency anemia    RLS (restless legs syndrome)     Past Surgical History:  Procedure Laterality Date   BLADDER REPAIR     CATARACT EXTRACTION W/PHACO Right 05/13/2016   Procedure: CATARACT EXTRACTION PHACO AND INTRAOCULAR LENS PLACEMENT ([place]);  Surgeon: Birder Robson, MD;  Location: [facility] ORS;  Service: Ophthalmology;  Laterality: Right;  [place] 1.09 AP% 21.0 CDE 14.60 Fluid Pack Lot # [id number]   CATARACT EXTRACTION W/PHACO Left 07/15/2016   Procedure: CATARACT EXTRACTION PHACO AND INTRAOCULAR LENS PLACEMENT (IOC);  Surgeon: Birder Robson, MD;   Location: [facility] ORS;  Service: Ophthalmology;  Laterality: Left;  Lot# [id number] H [place]: 01:17.9 AP%:27.8 CDE: 21.67     (Not in a hospital admission)  Social History   Socioeconomic History   Marital status: Single    Spouse name: Not on file   Number of children: Not on file   Years of education: Not on file   Highest education level: Not on file  Occupational History   Not on file  Tobacco Use   Smoking status: Former   Smokeless tobacco: Never  Vaping Use   Vaping Use: Never used  Substance and Sexual Activity   Alcohol use: No   Drug use: No   Sexual activity: Not on file  Other Topics Concern   Not on file  Social History Narrative   Not on file   Social Determinants of Health   Financial Resource Strain: Not on file  Food Insecurity: No Food Insecurity (09/12/2022)   Hunger Vital Sign    Worried About Running Out of Food in the Last Year: Never true    [place] in the Last Year: Never true  Transportation Needs: No Transportation Needs (09/12/2022)   PRAPARE - Hydrologist (Medical): No    Lack of Transportation (Non-Medical): No  Physical Activity: Not on file  Stress: Not on file  Social Connections: Not on file  Intimate Partner Violence: Not At Risk (09/12/2022)   Humiliation, Afraid, Rape, and Kick questionnaire    Fear of Current or Ex-Partner: No    Emotionally Abused: No    Physically Abused: No    Sexually Abused: No  History reviewed. No pertinent family history.    Review of systems complete and found to be negative unless listed above      PHYSICAL EXAM  General: Well developed, well nourished, in no acute distress HEENT:  Normocephalic and atramatic Neck:  No JVD.  Lungs: Clear bilaterally to auscultation and percussion. Heart: HRRR . Normal S1 and S2 without gallops or murmurs.  Abdomen: Bowel sounds are positive, abdomen soft and non-tender  Msk:  Back normal, normal gait. Normal strength and tone for  age. Extremities: No clubbing, cyanosis or edema.   Neuro: Alert and oriented X 3. Psych:  Good affect, responds appropriately  Labs:   Lab Results  Component Value Date   WBC 10.0 09/12/2022   HGB 7.9 (L) 09/12/2022   HCT 26.0 (L) 09/12/2022   MCV 107.4 (H) 09/12/2022   PLT 221 09/12/2022    Recent Labs  Lab 09/12/22 0656  NA 134*  K 3.8  CL 98  CO2 24  BUN 44*  CREATININE 2.27*  CALCIUM 9.0  PROT 7.0  BILITOT 0.7  ALKPHOS 50  ALT 16  AST 20  GLUCOSE 188*   No results found for: "CKTOTAL", "CKMB", "CKMBINDEX", "TROPONINI"  Lab Results  Component Value Date   CHOL 112 06/27/2022   CHOL 101 06/14/2022   Lab Results  Component Value Date   HDL 38 (L) 06/27/2022   HDL 37 (L) 06/14/2022   Lab Results  Component Value Date   LDLCALC 58 06/27/2022   LDLCALC 47 06/14/2022   Lab Results  Component Value Date   TRIG 80 06/27/2022   TRIG 86 06/14/2022   Lab Results  Component Value Date   CHOLHDL 2.9 06/27/2022   CHOLHDL 2.7 06/14/2022   No results found for: "LDLDIRECT"    Radiology: [facility] Chest Port 1 View  Result Date: 09/12/2022 CLINICAL DATA:  Cough and hypoxia.  Shortness of breath. EXAM: PORTABLE CHEST 1 VIEW COMPARISON:  06/26/2022 FINDINGS: The cardio pericardial silhouette is enlarged. Diffuse interstitial opacity again noted suggesting edema. There is somewhat focal airspace disease in the right parahilar lung with a nodular opacity in the left mid lung. Tiny bilateral pleural effusions. Bones are diffusely demineralized. Telemetry leads overlie the chest. IMPRESSION: 1. Persistent interstitial opacity suggesting edema. 2. Somewhat focal airspace disease in the right parahilar lung with a nodular opacity in the left mid lung. These findings may reflect areas of asymmetric edema or multifocal infection. Given the nodular character to the left-sided opacity, follow-up is recommended to ensure resolution. Reviewing chest CTA 06/13/2022, no left nodule or mass  in this region on that study. Electronically Signed   By: Misty Stanley M.D.   On: 09/12/2022 07:21    EKG: Normal sinus rhythm 91 bpm  ASSESSMENT AND PLAN:   NSTEMI, in the setting of acute on chronic diastolic congestive heart failure, HFpEF, the absence of chest pain, with normal ECG, patient with known moderate to severe aortic stenosis, chronic kidney disease stage IV Acute on chronic diastolic congestive heart failure, HFpEF, EF 60-65% 06/18/2022, good initial clinical response to IV furosemide Moderate to severe aortic stenosis, AVA 0.89 with peak gradient 42.9 mmHg, mean gradient 27.0 mmHg on 06/18/2022 Chronic kidney disease stage IV, BUN and creatinine 44 and 2.7, respectively  Recommendations  1.  Agree with current therapy 2.  Continue IV heparin for 48-72 hours 3.  Continue diuresis 4.  Carefully monitor renal status 6.  In light of patient's advanced age, absence of chest pain, normal  ECG, would recommend conservative management, defer cardiac catheterization.  Patient is in agreement with conservative management.  Signed: Isaias Cowman MD,PhD, [facility] 09/12/2022, 4:53 PM

## 2022-09-13 ENCOUNTER — Inpatient Hospital Stay
Admit: 2022-09-13 | Discharge: 2022-09-13 | Disposition: A | Discharge status: Home / Self Care | Payer: Medicare HMO | Attending: Internal Medicine | Admitting: Internal Medicine

## 2022-09-13 DIAGNOSIS — I5030 Unspecified diastolic (congestive) heart failure: Secondary | ICD-10-CM

## 2022-09-13 DIAGNOSIS — D62 Acute posthemorrhagic anemia: Secondary | ICD-10-CM | POA: Diagnosis not present

## 2022-09-13 DIAGNOSIS — I214 Non-ST elevation (NSTEMI) myocardial infarction: Secondary | ICD-10-CM

## 2022-09-13 DIAGNOSIS — I5033 Acute on chronic diastolic (congestive) heart failure: Secondary | ICD-10-CM | POA: Diagnosis not present

## 2022-09-13 LAB — TROPONIN I (HIGH SENSITIVITY)
Troponin I (High Sensitivity): 2504 ng/L (ref <18)
Troponin I (High Sensitivity): 2271 ng/L (ref <18)
Troponin I (High Sensitivity): 2121 ng/L (ref <18)

## 2022-09-13 LAB — ECHOCARDIOGRAM COMPLETE
AR max vel: 0.7 cm2
AV Area VTI: 0.74 cm2
AV Area mean vel: 0.64 cm2
AV Mean grad: 35.2 mmHg
AV Peak grad: 65.6 mmHg
Ao pk vel: 4.05 m/s
Area-P 1/2: 3.74 cm2
Height: 61 in
S' Lateral: 3.1 cm
Weight: 2051.2 oz

## 2022-09-13 LAB — CBC
HCT: 20.6 % — ABNORMAL LOW (ref 36.0–46.0)
HCT: 26.5 % — ABNORMAL LOW (ref 36.0–46.0)
Hemoglobin: 6.4 g/dL — ABNORMAL LOW (ref 12.0–15.0)
Hemoglobin: 8.6 g/dL — ABNORMAL LOW (ref 12.0–15.0)
MCH: 33 pg (ref 26.0–34.0)
MCH: 32.5 pg (ref 26.0–34.0)
MCHC: 31.1 g/dL (ref 30.0–36.0)
MCHC: 32.5 g/dL (ref 30.0–36.0)
MCV: 106.2 fL — ABNORMAL HIGH (ref 80.0–100.0)
MCV: 100 fL (ref 80.0–100.0)
Platelets: 171 10*3/uL (ref 150–400)
Platelets: 183 K/uL (ref 150–400)
RBC: 1.94 MIL/uL — ABNORMAL LOW (ref 3.87–5.11)
RBC: 2.65 MIL/uL — ABNORMAL LOW (ref 3.87–5.11)
RDW: 13.5 % (ref 11.5–15.5)
RDW: 15.1 % (ref 11.5–15.5)
WBC: 8.6 10*3/uL (ref 4.0–10.5)
WBC: 7.4 K/uL (ref 4.0–10.5)
nRBC: 0 % (ref 0.0–0.2)
nRBC: 0 % (ref 0.0–0.2)

## 2022-09-13 LAB — BASIC METABOLIC PANEL
Anion gap: 9 (ref 5–15)
BUN: 43 mg/dL — ABNORMAL HIGH (ref 8–23)
CO2: 25 mmol/L (ref 22–32)
Calcium: 8.5 mg/dL — ABNORMAL LOW (ref 8.9–10.3)
Chloride: 95 mmol/L — ABNORMAL LOW (ref 98–111)
Creatinine, Ser: 2.27 mg/dL — ABNORMAL HIGH (ref 0.44–1.00)
GFR, Estimated: 20 mL/min — ABNORMAL LOW (ref >60)
Glucose, Bld: 169 mg/dL — ABNORMAL HIGH (ref 70–99)
Potassium: 3.8 mmol/L (ref 3.5–5.1)
Sodium: 129 mmol/L — ABNORMAL LOW (ref 135–145)

## 2022-09-13 LAB — RETICULOCYTES
Immature Retic Fract: 27 % — ABNORMAL HIGH (ref 2.3–15.9)
RBC.: 1.94 MIL/uL — ABNORMAL LOW (ref 3.87–5.11)
Retic Count, Absolute: 140.3 10*3/uL (ref 19.0–186.0)
Retic Ct Pct: 7.2 % — ABNORMAL HIGH (ref 0.4–3.1)

## 2022-09-13 LAB — HEPARIN LEVEL (UNFRACTIONATED)
Heparin Unfractionated: 0.23 IU/mL — ABNORMAL LOW (ref 0.30–0.70)
Heparin Unfractionated: 0.11 IU/mL — ABNORMAL LOW (ref 0.30–0.70)
Heparin Unfractionated: 0.1 IU/mL — ABNORMAL LOW (ref 0.30–0.70)

## 2022-09-13 LAB — HEMOGLOBIN
Hemoglobin: 6.6 g/dL — ABNORMAL LOW (ref 12.0–15.0)
Hemoglobin: 6.7 g/dL — ABNORMAL LOW (ref 12.0–15.0)

## 2022-09-13 LAB — PREPARE RBC (CROSSMATCH)

## 2022-09-13 MED ORDER — SODIUM CHLORIDE 0.9% IV SOLUTION: Freq: Once | INTRAVENOUS | Status: AC

## 2022-09-13 MED ORDER — HEPARIN BOLUS VIA INFUSION
900.0000 [IU] | Freq: Once | INTRAVENOUS | Status: DC
  Filled 2022-09-13: qty 900

## 2022-09-13 MED ORDER — GUAIFENESIN-DM 100-10 MG/5ML PO SYRP
5.0000 mL | ORAL_SOLUTION | ORAL | Status: DC | PRN
  Administered 2022-09-13 – 2022-09-15 (×9): 5 mL via ORAL
  Filled 2022-09-13 (×9): qty 10

## 2022-09-13 NOTE — Progress Notes (Signed)
Started continuous pulse ox and noted patient sating in the 80 s while on 6 L/[place]. RT notified  to switch to HF. Will wait for it and continue to monitor.

## 2022-09-13 NOTE — Consult Note (Addendum)
ANTICOAGULATION CONSULT NOTE - Initial Consult  Pharmacy Consult for heparin Indication: chest pain/ACS  Allergies  Allergen Reactions   Atorvastatin Other (See Comments)   Codeine    Demerol [Meperidine]    Polysaccharide Iron Complex Other (See Comments)    Caused constipation   Naproxen Rash    Caused rash on mouth    Patient Measurements: Height: 5\' 1"  (154.9 cm) Weight: 58.2 kg (128 lb 3.2 oz) IBW/kg (Calculated) : 47.8 Heparin Dosing Weight: 59 kg  Vital Signs: Temp: 98.4 F (36.9 C) (02/02 2337) Temp Source: Axillary (02/02 2337) BP: 104/46 (02/03 0040) Pulse Rate: 73 (02/02 1907)  Labs: Recent Labs    09/12/22 0656 09/12/22 1124 09/12/22 1500 09/12/22 1736 09/13/22 0100  HGB 7.9*  --   --   --  6.4*  HCT 26.0*  --   --   --  20.6*  PLT 221  --   --   --  171  APTT  --   --   --  >200*  --   LABPROT 14.0  --   --   --   --   INR 1.1  --   --   --   --   HEPARINUNFRC  --   --   --   --  0.23*  CREATININE 2.27*  --   --   --  2.27*  TROPONINIHS 40* 344* 1,993*  --   --      Estimated Creatinine Clearance: 13 mL/min (A) (by C-G formula based on SCr of 2.27 mg/dL (H)).   Medical History: Past Medical History:  Diagnosis Date   CHF (congestive heart failure) (HCC)    Diabetes mellitus without complication (HCC)    GERD (gastroesophageal reflux disease)    Hypertension    Insomnia    Iron deficiency anemia    RLS (restless legs syndrome)     Medications:  No evidence of anticoagulant use PTA Pt was receiving heparin 5000u SQ Q12H for DVT ppx (last dose 2/2 @ 6222)  Assessment: Pharmacy consulted to dose heparin in this 89 yo F with troponin elevated from 40 >> 1993.  CrCl 13.8.    Baseline labs: baseline aPTT >200 (bolus/drip hung @1700  and aPTT drawn at 1736) INR 1.1, Hgb 7.9, Plts 221  2/3 0100 HL 0.23, Subtherapeutic 700 > 850 units/hr  Goal of Therapy:  Heparin level 0.3-0.5 units/ml Monitor platelets by anticoagulation protocol:  Yes   Plan:  Heparin subtherapeutic; however hemoglobin down 7.9 > 6.4 After discussion with MD, will transition to lower range heparin goal of 0.3-0.5 and withhold boluses.  Repeat CBC Increase heparin infusion to 750 units/hr Check anti-Xa level in 8 hours following rate change Continue to monitor H&H and platelets  Dorothe Pea, PharmD, BCPS Clinical Pharmacist   09/13/2022,1:39 AM

## 2022-09-13 NOTE — Consult Note (Signed)
ANTICOAGULATION CONSULT NOTE  Pharmacy Consult for Heparin Infusion Indication: chest pain/ACS  Allergies  Allergen Reactions   Atorvastatin Other (See Comments)   Codeine    Demerol [Meperidine]    Polysaccharide Iron Complex Other (See Comments)    Caused constipation   Naproxen Rash    Caused rash on mouth    Patient Measurements: Height: 5\' 1"  (154.9 cm) Weight: 58.2 kg (128 lb 3.2 oz) IBW/kg (Calculated) : 47.8 Heparin Dosing Weight: 59 kg  Vital Signs: Temp: 97.7 F (36.5 C) (02/03 1002) Temp Source: Oral (02/03 1002) BP: 103/42 (02/03 1002) Pulse Rate: 66 (02/03 1002)  Labs: Recent Labs    09/12/22 0656 09/12/22 1124 09/12/22 1736 09/13/22 0100 09/13/22 0223 09/13/22 0354 09/13/22 0448 09/13/22 0716 09/13/22 1053  HGB 7.9*  --   --  6.4* 6.6*  --   --  6.7*  --   HCT 26.0*  --   --  20.6*  --   --   --   --   --   PLT 221  --   --  171  --   --   --   --   --   APTT  --   --  >200*  --   --   --   --   --   --   LABPROT 14.0  --   --   --   --   --   --   --   --   INR 1.1  --   --   --   --   --   --   --   --   HEPARINUNFRC  --   --   --  0.23*  --   --   --   --  0.11*  CREATININE 2.27*  --   --  2.27*  --   --   --   --   --   TROPONINIHS 40*   < >  --   --  2,504* 2,271* 2,121*  --   --    < > = values in this interval not displayed.     Estimated Creatinine Clearance: 13 mL/min (A) (by C-G formula based on SCr of 2.27 mg/dL (H)).   Medical History: Past Medical History:  Diagnosis Date   CHF (congestive heart failure) (HCC)    Diabetes mellitus without complication (HCC)    GERD (gastroesophageal reflux disease)    Hypertension    Insomnia    Iron deficiency anemia    RLS (restless legs syndrome)     Medications:  Scheduled:   aspirin EC  81 mg Oral Daily   carvedilol  3.125 mg Oral BID WC   doxycycline  100 mg Oral Q12H   ferrous sulfate  325 mg Oral Daily   furosemide  40 mg Intravenous BID   isosorbide mononitrate  60 mg Oral  Daily   melatonin  10 mg Oral QHS   pantoprazole  40 mg Oral Daily   rosuvastatin  5 mg Oral Daily   sodium chloride flush  3 mL Intravenous Q12H   traZODone  50 mg Oral QHS   Infusions:   sodium chloride     heparin 850 Units/hr (09/13/22 1207)   PRN: sodium chloride, acetaminophen, guaiFENesin-dextromethorphan, hydrALAZINE, ondansetron (ZOFRAN) IV, sodium chloride flush  Assessment: Lisa Mcneil is a 89 y.o. female presenting with SOB. PMH significant for HFpEF, CAD, chronic IDA, HTN, CKD4, T2DM. Patient was not on [facility] PTA per  chart review. Patient was receiving heparin 5000 units SQ Q12H for DVT ppx (last dose 2/2 @ 7169). Patient was started on heparin infusion for suspected NSTEMI and Hgb dropped from 7.9 to 6.4 in ~20 hours. Per discussion with MD, goal range was adjusted and will avoid boluses. Pharmacy has been consulted to initiate and manage heparin infusion.   Baseline Labs: PT 14.0, INR 1.1, Hgb 7.9, Hct 26.0, Plt 221   Goal of Therapy:  Heparin level 0.3-0.5 units/ml, no boluses Monitor platelets by anticoagulation protocol: Yes   Date Time HL Rate/Comment  2/3 0100 0.23 700/subtherapeutic 2/3 1053 0.11 750/subtherapeutic  Plan:  No boluses per MD Increase heparin infusion to 850 units/hr Check HL 8 hours after rate change  Continue to monitor H&H and platelets daily while on heparin infusion   Gretel Acre, PharmD PGY1 Pharmacy Resident 09/13/2022 12:35 PM

## 2022-09-13 NOTE — Progress Notes (Signed)
SUBJECTIVE: Patient is less short of breath but has some cough   Vitals:   09/13/22 0940 09/13/22 1000 09/13/22 1002 09/13/22 1136  BP: (!) 117/41 (!) 103/42 (!) 103/42 117/79  Pulse: 69 66 66 63  Resp: 20 20 20 14   Temp: 98 F (36.7 C) 97.7 F (36.5 C) 97.7 F (36.5 C) 97.8 F (36.6 C)  TempSrc: Oral Oral Oral Oral  SpO2: 95% 95% 95% 95%  Weight:      Height:        Intake/Output Summary (Last 24 hours) at 09/13/2022 1212 Last data filed at 09/13/2022 0725 Gross per 24 hour  Intake 613.57 ml  Output 825 ml  Net -211.43 ml    LABS: Basic Metabolic Panel: Recent Labs    09/12/22 0656 09/13/22 0100  NA 134* 129*  K 3.8 3.8  CL 98 95*  CO2 24 25  GLUCOSE 188* 169*  BUN 44* 43*  CREATININE 2.27* 2.27*  CALCIUM 9.0 8.5*   Liver Function Tests: Recent Labs    09/12/22 0656  AST 20  ALT 16  ALKPHOS 50  BILITOT 0.7  PROT 7.0  ALBUMIN 3.7   Recent Labs    09/12/22 0656  LIPASE 35   CBC: Recent Labs    09/12/22 0656 09/13/22 0100 09/13/22 0223 09/13/22 0716  WBC 10.0 8.6  --   --   NEUTROABS 8.4*  --   --   --   HGB 7.9* 6.4* 6.6* 6.7*  HCT 26.0* 20.6*  --   --   MCV 107.4* 106.2*  --   --   PLT 221 171  --   --    Cardiac Enzymes: No results for input(s): "CKTOTAL", "CKMB", "CKMBINDEX", "TROPONINI" in the last 72 hours. BNP: Invalid input(s): "POCBNP" D-Dimer: No results for input(s): "DDIMER" in the last 72 hours. Hemoglobin A1C: No results for input(s): "HGBA1C" in the last 72 hours. Fasting Lipid Panel: No results for input(s): "CHOL", "HDL", "LDLCALC", "TRIG", "CHOLHDL", "LDLDIRECT" in the last 72 hours. Thyroid Function Tests: No results for input(s): "TSH", "T4TOTAL", "T3FREE", "THYROIDAB" in the last 72 hours.  Invalid input(s): "FREET3" Anemia Panel: Recent Labs    09/12/22 0659 09/12/22 0859 09/13/22 0059  VITAMINB12  --  289  --   TIBC 385  --   --   IRON 17*  --   --   RETICCTPCT  --   --  7.2*     PHYSICAL  EXAM General: Well developed, well nourished, in no acute distress HEENT:  Normocephalic and atramatic Neck:  No JVD.  Lungs: Clear bilaterally to auscultation and percussion. Heart: HRRR . Normal S1 and S2 without gallops or murmurs.  Abdomen: Bowel sounds are positive, abdomen soft and non-tender  Msk:  Back normal, normal gait. Normal strength and tone for age. Extremities: No clubbing, cyanosis or edema.   Neuro: Alert and oriented X 3. Psych:  Good affect, responds appropriately  TELEMETRY: Normal sinus rhythm  ASSESSMENT AND PLAN: Non-STEMI HFpEF and severe aortic stenosis on echocardiogram.  Given patient's age and comorbid conditions patient is to be treated conservatively.  Advise continuing heparin for 48 hours and then can be discharged with follow-up with Dr. Clayborn Bigness.  Principal Problem:   CHF (congestive heart failure) (HCC) Active Problems:   Essential hypertension   Acute CHF (congestive heart failure) (HCC)   CKD (chronic kidney disease) stage 4, GFR 15-29 ml/min (HCC)   Acute on chronic diastolic (congestive) heart failure ([place])    Neoma Laming,  MD, [facility] 09/13/2022 12:12 PM

## 2022-09-13 NOTE — Progress Notes (Signed)
Hbg dropped to 6.4 this morning. Dr. Damita Dunnings aware of it without any new order.  No bleeding, hematoma or any new bruises noted. Will continue to monitor.

## 2022-09-13 NOTE — Consult Note (Signed)
ANTICOAGULATION CONSULT NOTE  Pharmacy Consult for Heparin Infusion Indication: chest pain/ACS  Allergies  Allergen Reactions   Atorvastatin Other (See Comments)   Codeine    Demerol [Meperidine]    Polysaccharide Iron Complex Other (See Comments)    Caused constipation   Naproxen Rash    Caused rash on mouth    Patient Measurements: Height: 5\' 1"  (154.9 cm) Weight: 58.2 kg (128 lb 3.2 oz) IBW/kg (Calculated) : 47.8 Heparin Dosing Weight: 59 kg  Vital Signs: Temp: 98.1 F (36.7 C) (02/03 1949) Temp Source: Oral (02/03 1949) BP: 127/50 (02/03 1949) Pulse Rate: 65 (02/03 1949)  Labs: Recent Labs    09/12/22 0656 09/12/22 1124 09/12/22 1736 09/13/22 0100 09/13/22 0223 09/13/22 0354 09/13/22 0448 09/13/22 0716 09/13/22 1053 09/13/22 1657 09/13/22 1952  HGB 7.9*  --   --  6.4* 6.6*  --   --  6.7*  --  8.6*  --   HCT 26.0*  --   --  20.6*  --   --   --   --   --  26.5*  --   PLT 221  --   --  171  --   --   --   --   --  183  --   APTT  --   --  >200*  --   --   --   --   --   --   --   --   LABPROT 14.0  --   --   --   --   --   --   --   --   --   --   INR 1.1  --   --   --   --   --   --   --   --   --   --   HEPARINUNFRC  --   --   --  0.23*  --   --   --   --  0.11*  --  0.10*  CREATININE 2.27*  --   --  2.27*  --   --   --   --   --   --   --   TROPONINIHS 40*   < >  --   --  2,504* 2,271* 2,121*  --   --   --   --    < > = values in this interval not displayed.     Estimated Creatinine Clearance: 13 mL/min (A) (by C-G formula based on SCr of 2.27 mg/dL (H)).   Medical History: Past Medical History:  Diagnosis Date   CHF (congestive heart failure) (HCC)    Diabetes mellitus without complication (HCC)    GERD (gastroesophageal reflux disease)    Hypertension    Insomnia    Iron deficiency anemia    RLS (restless legs syndrome)     Medications:  Scheduled:   aspirin EC  81 mg Oral Daily   carvedilol  3.125 mg Oral BID WC   doxycycline  100 mg  Oral Q12H   ferrous sulfate  325 mg Oral Daily   furosemide  40 mg Intravenous BID   isosorbide mononitrate  60 mg Oral Daily   melatonin  10 mg Oral QHS   pantoprazole  40 mg Oral Daily   rosuvastatin  5 mg Oral Daily   sodium chloride flush  3 mL Intravenous Q12H   traZODone  50 mg Oral QHS   Infusions:   sodium chloride  heparin 850 Units/hr (09/13/22 1912)   PRN: sodium chloride, acetaminophen, guaiFENesin-dextromethorphan, hydrALAZINE, ondansetron (ZOFRAN) IV, sodium chloride flush  Assessment: Lisa Mcneil is a 89 y.o. female presenting with SOB. PMH significant for HFpEF, CAD, chronic IDA, HTN, CKD4, T2DM. Patient was not on [facility] PTA per chart review. Patient was receiving heparin 5000 units SQ Q12H for DVT ppx (last dose 2/2 @ 5271). Patient was started on heparin infusion for suspected NSTEMI and Hgb dropped from 7.9 to 6.4 in ~20 hours. Per discussion with MD, goal range was adjusted and will avoid boluses. Pharmacy has been consulted to initiate and manage heparin infusion.   Baseline Labs: PT 14.0, INR 1.1, Hgb 7.9, Hct 26.0, Plt 221   Goal of Therapy:  Heparin level 0.3-0.5 units/ml, no boluses Monitor platelets by anticoagulation protocol: Yes   Date Time HL Rate/Comment  2/3 0100 0.23 700/subtherapeutic 2/3 1053 0.11 750/subtherapeutic 2/3 1952 0.10 850/subtherapeutic Plan:  No boluses per MD Increase heparin infusion to 1050 units/hr Check HL 8 hours after rate change  Continue to monitor H&H and platelets daily while on heparin infusion   Vick Frees, PharmD  09/13/2022 8:59 PM

## 2022-09-13 NOTE — Progress Notes (Signed)
Order rcd to transfuse 2 units PRBC.  Consent obtained by this RN.  Pt in agreement with POC

## 2022-09-13 NOTE — Progress Notes (Signed)
  Progress Note   [name] KDT:[id number] DOB: 20-Sep-1927 DOA: 09/12/2022     1 DOS: the patient was seen and examined on 09/13/2022   Brief hospital course:  Assessment and Plan: NSTEMI - IV heparin drip  - ASA 81 mg PO daily  - Crestor 5 mg PO daily  - Coreg 3.125 mg PO bid   Anemia . - 2 unit PRBC today (09/13/2022) - Spoke with GI on 09/13/2022, if there is not an appropriate response in the pt's Hgb will officially consult GI (pt has several co-morbidities which have to be taken into account prior to endoscopic evaluation)  - Ferrous sulfate 325 mg PO daily   Acute on chronic diastolic CHF - IV lasix 40 bid   HTN  - Coreg as above  - Imdur 60 mg PO daily   DVT prophylaxis:  GI prophylaxis: Protonix 40 mg PO daily       Subjective: Pt seen and examined at the bedside. Hgb this morning 6.7 and the pt was ordered for 2 unit PRBC. Continues on heparin drip per cardiology note. Monitor H&H and reach back out to GI if there is not an appropriate response in the pt's Hgb.    Physical Exam: Vitals:   09/13/22 1000 09/13/22 1002 09/13/22 1136 09/13/22 1310  BP: (!) 103/42 (!) 103/42 117/79 (!) 115/55  Pulse: 66 66 63 75  Resp: 20 20 14 18   Temp: 97.7 F (36.5 C) 97.7 F (36.5 C) 97.8 F (36.6 C) 97.7 F (36.5 C)  TempSrc: Oral Oral Oral Oral  SpO2: 95% 95% 95% 96%  Weight:      Height:       Physical Exam Constitutional:      Appearance: She is well-developed.  HENT:     Head: Normocephalic and atraumatic.  Cardiovascular:     Rate and Rhythm: Normal rate and regular rhythm.  Pulmonary:     Effort: Pulmonary effort is normal.  Abdominal:     Palpations: Abdomen is soft.  Skin:    General: Skin is warm.  Neurological:     Mental Status: She is alert.     Comments: At baseline, awake    Psychiatric:        Mood and Affect: Mood normal.    Data Reviewed:   Disposition: Status is: Inpatient  Planned Discharge Destination: Home    Time  spent: 35 minutes  Author: Lucienne Minks , MD 09/13/2022 2:07 PM  For on call review [email or web address].

## 2022-09-13 NOTE — Progress Notes (Signed)
  Echocardiogram 2D Echocardiogram has been performed.  Claretta Fraise 09/13/2022, 11:07 AM

## 2022-09-13 NOTE — Evaluation (Signed)
Physical Therapy Evaluation Patient Details Name: Lisa Mcneil MRN: [id number] DOB: [birth year] Today's Date: 09/13/2022  History of Present Illness  Past medical history of CHF, diabetes, GERD, hypertension, iron deficiency anemia who presents to the emergency department with 1 week of dry cough and then last night developed shortness of breath while sleeping.  Denies chest pain, denies chest tightness, denies abdominal pain nausea vomiting.  No other acute medical complaints.Does not wear home O2.  Clinical Impression  The pt presents this session is great spirits. She presents with cardiopulmonary status limiting her functional mobility. At this time the pt requires 7LO2 at baseline and during functional mobility.   The pt demonstrates good generalized strength, however gait distance greatly limited 2/2 line and lead management. It is expected that this patient will progress to d/c home with intermittent assistance once medically stable. PT will continue to follow.      Recommendations for follow up therapy are one component of a multi-disciplinary discharge planning process, led by the attending physician.  Recommendations may be updated based on patient status, additional functional criteria and insurance authorization.  Follow Up Recommendations No PT follow up      Assistance Recommended at Discharge None  Patient can return home with the following  A little help with walking and/or transfers;A little help with bathing/dressing/bathroom    Equipment Recommendations    Recommendations for Other Services       Functional Status Assessment Patient has had a recent decline in their functional status and demonstrates the ability to make significant improvements in function in a reasonable and predictable amount of time.     Precautions / Restrictions Precautions Precautions: Fall Restrictions Weight Bearing Restrictions: No      Mobility  Bed Mobility Overal bed mobility:  Needs Assistance Bed Mobility: Supine to Sit     Supine to sit: Min guard     General bed mobility comments: for line managment    Transfers Overall transfer level: Needs assistance Equipment used: None Transfers: Sit to/from Stand Sit to Stand: Min guard           General transfer comment: for line management    Ambulation/Gait Ambulation/Gait assistance: Supervision Gait Distance (Feet): 10 Feet Assistive device: None         General Gait Details: Pt's gait distances limited d/t line and lead management needs.  Stairs            Wheelchair Mobility    Modified Rankin (Stroke Patients Only)       Balance Overall balance assessment: Needs assistance   Sitting balance-Leahy Scale: Good       Standing balance-Leahy Scale: Good                               Pertinent Vitals/Pain Pain Assessment Pain Assessment: No/denies pain    Home Living Family/patient expects to be discharged to:: Private residence Living Arrangements: Alone Available Help at Discharge: Available PRN/intermittently Type of Home: Apartment Home Access: Stairs to enter Entrance Stairs-Rails: Left Entrance Stairs-Number of Steps: 6   Home Layout: One level Home Equipment: Grab bars - tub/shower      Prior Function Prior Level of Function : Independent/Modified Independent;Working/employed;Driving             Mobility Comments: works part time, drives, community level participation without AD. 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[employer]; cooks, cleans, drives, grocery shops     Hand Dominance   Dominant Hand: Right    Extremity/Trunk Assessment   Upper Extremity Assessment Upper Extremity Assessment: Overall WFL for tasks assessed    Lower Extremity Assessment Lower Extremity Assessment: Overall WFL for tasks assessed    Cervical / Trunk Assessment Cervical / Trunk Assessment: Normal   Communication   Communication: No difficulties  Cognition Arousal/Alertness: Awake/alert Behavior During Therapy: WFL for tasks assessed/performed Overall Cognitive Status: Within Functional Limits for tasks assessed                                          General Comments      Exercises     Assessment/Plan    PT Assessment Patient needs continued PT services  PT Problem List Cardiopulmonary status limiting activity       PT Treatment Interventions      PT Goals (Current goals can be found in the Care Plan section)  Acute Rehab PT Goals Patient Stated Goal: To return to work PT Goal Formulation: With patient Time For Goal Achievement: 09/27/22 Potential to Achieve Goals: Good    Frequency Min 2X/week     Co-evaluation               AM-PAC PT "6 Clicks" Mobility  Outcome Measure Help needed turning from your back to your side while in a flat bed without using bedrails?: A Little Help needed moving from lying on your back to sitting on the side of a flat bed without using bedrails?: A Little Help needed moving to and from a bed to a chair (including a wheelchair)?: A Little Help needed standing up from a chair using your arms (e.g., wheelchair or bedside chair)?: A Little Help needed to walk in hospital room?: A Little Help needed climbing 3-5 steps with a railing? : A Little 6 Click Score: 18    End of Session Equipment Utilized During Treatment: Oxygen Activity Tolerance: Patient tolerated treatment well Patient left: in chair;with nursing/sitter in room;with call bell/phone within reach Nurse Communication: Mobility status PT Visit Diagnosis: Difficulty in walking, not elsewhere classified (R26.2)    Time: 0881-1031 PT Time Calculation (min) (ACUTE ONLY): 19 min   Charges:   PT Evaluation $PT Eval Low Complexity: 1 Low PT Treatments $Therapeutic Activity: 8-22 mins        3:29 PM, 09/13/22 Lisa Mcneil A. Saverio Danker PT,  DPT Physical Therapist - [facility]   Lisa Mcneil 09/13/2022, 3:27 PM

## 2022-09-13 NOTE — Progress Notes (Signed)
Pt switched to 8l hfc due to O2 sats dropping into 80s on 6L

## 2022-09-13 NOTE — Progress Notes (Addendum)
  Event progress Note   [name] YFV:[id number] DOB: 05/07/1928 DOA: 09/12/2022     1 DOS: the patient was seen and examined on 09/13/2022   Event : Per pharmacy: "Patient on heparin infusion for NSTEMI, Hemoglobin just resulted at 6.4 (down from 7.9) wanted to confirm if able to see any signs of bleeding and what you wanted to do with the heparin infusion at this time"  Subjective: Patient evaluated at bedside.  She denied any bleeding.  Stated she had used the bathroom denies seeing blood in the commode or on wiping.  Denies chest pain.  Denies shortness of breath beyond what she was feeling earlier.  Denies palpitations.   Assessment and Plan: Possible acute blood loss anemia in the setting of heparin infusion for NSTEMI -Hemoglobin 6.4, down from 7.9.Marland KitchenPatient with no chest pain. - Repeat troponin continues to uptrend  [id number].  Still consistent with NSTEMI versus demand ischemia - Repeat hemoglobin 6.4-6.6 - Discussed with pharmacy to keep heparin infusion at the lower end of range - Given no overt bleeding and patient at baseline and hemoglobin essentially 1 point below baseline, will continue heparin infusion at lower range and continue to monitor hemoglobin closely with low threshold for discontinuation - This was discussed with patient who voiced understanding and agreement with this plan. - Stool for occult blood also ordered -Continue to trend H&H  Acute on chronic diastolic CHF - Continue prior management  Essential hypertension CKD 4 - Continue management   Physical Exam: Vitals:   09/13/22 0050 09/13/22 0200 09/13/22 0400 09/13/22 0404  BP:   (!) 118/40   Pulse: 64 67 64 64  Resp: 19 (!) 22 18 20   Temp:      TempSrc:      SpO2: 92% 92% 93% 93%  Weight:      Height:       Physical Exam Vitals and nursing note reviewed.  Constitutional:      General: She is not in acute distress. HENT:     Head: Normocephalic and atraumatic.   Cardiovascular:     Rate and Rhythm: Normal rate and regular rhythm.     Heart sounds: Normal heart sounds.  Pulmonary:     Effort: Pulmonary effort is normal.     Breath sounds: Normal breath sounds.  Abdominal:     Palpations: Abdomen is soft.     Tenderness: There is no abdominal tenderness.  Neurological:     Mental Status: Mental status is at baseline.     Data Reviewed: Recent progress notes, labs, cardiology consult notes reviewed   Family Communication: none  CRITICAL CARE Performed by: Athena Masse   Total critical care time: 60 minutes  Critical care time was exclusive of separately billable procedures and treating other patients.  Critical care was necessary to treat or prevent imminent or life-threatening deterioration.  Critical care was time spent personally by me on the following activities: development of treatment plan with patient and/or surrogate as well as nursing, discussions with consultants, evaluation of patient's response to treatment, examination of patient, obtaining history from patient or surrogate, ordering and performing treatments and interventions, ordering and review of laboratory studies, ordering and review of radiographic studies, pulse oximetry and re-evaluation of patient's condition.  Time spent: 90 minutes  Author: Athena Masse, MD 09/13/2022 4:42 AM  For on call review [email or web address].

## 2022-09-13 NOTE — Progress Notes (Signed)
New order for H & H and occult stool received from DR. Damita Dunnings.

## 2022-09-14 DIAGNOSIS — I10 Essential (primary) hypertension: Secondary | ICD-10-CM

## 2022-09-14 DIAGNOSIS — I5033 Acute on chronic diastolic (congestive) heart failure: Secondary | ICD-10-CM | POA: Diagnosis not present

## 2022-09-14 DIAGNOSIS — I350 Nonrheumatic aortic (valve) stenosis: Secondary | ICD-10-CM

## 2022-09-14 DIAGNOSIS — J9601 Acute respiratory failure with hypoxia: Secondary | ICD-10-CM

## 2022-09-14 DIAGNOSIS — N184 Chronic kidney disease, stage 4 (severe): Secondary | ICD-10-CM

## 2022-09-14 DIAGNOSIS — D500 Iron deficiency anemia secondary to blood loss (chronic): Secondary | ICD-10-CM

## 2022-09-14 DIAGNOSIS — I214 Non-ST elevation (NSTEMI) myocardial infarction: Secondary | ICD-10-CM | POA: Diagnosis not present

## 2022-09-14 DIAGNOSIS — E871 Hypo-osmolality and hyponatremia: Secondary | ICD-10-CM | POA: Insufficient documentation

## 2022-09-14 LAB — TYPE AND SCREEN
ABO/RH(D): A POS
Antibody Screen: NEGATIVE
Unit division: 0

## 2022-09-14 LAB — CBC
HCT: 25.7 % — ABNORMAL LOW (ref 36.0–46.0)
Hemoglobin: 8.3 g/dL — ABNORMAL LOW (ref 12.0–15.0)
MCH: 32 pg (ref 26.0–34.0)
MCHC: 32.3 g/dL (ref 30.0–36.0)
MCV: 99.2 fL (ref 80.0–100.0)
Platelets: 175 10*3/uL (ref 150–400)
RBC: 2.59 MIL/uL — ABNORMAL LOW (ref 3.87–5.11)
RDW: 14.6 % (ref 11.5–15.5)
WBC: 6.3 10*3/uL (ref 4.0–10.5)
nRBC: 0 % (ref 0.0–0.2)

## 2022-09-14 LAB — BASIC METABOLIC PANEL
Anion gap: 8 (ref 5–15)
BUN: 46 mg/dL — ABNORMAL HIGH (ref 8–23)
CO2: 27 mmol/L (ref 22–32)
Calcium: 8.7 mg/dL — ABNORMAL LOW (ref 8.9–10.3)
Chloride: 98 mmol/L (ref 98–111)
Creatinine, Ser: 2.03 mg/dL — ABNORMAL HIGH (ref 0.44–1.00)
GFR, Estimated: 23 mL/min — ABNORMAL LOW (ref >60)
Glucose, Bld: 173 mg/dL — ABNORMAL HIGH (ref 70–99)
Potassium: 3.7 mmol/L (ref 3.5–5.1)
Sodium: 133 mmol/L — ABNORMAL LOW (ref 135–145)

## 2022-09-14 LAB — BPAM RBC
Blood Product Expiration Date: 202403062359
ISSUE DATE / TIME: 202402030938
Unit Type and Rh: 6200

## 2022-09-14 LAB — HEPARIN LEVEL (UNFRACTIONATED): Heparin Unfractionated: 0.36 IU/mL (ref 0.30–0.70)

## 2022-09-14 NOTE — Consult Note (Signed)
ANTICOAGULATION CONSULT NOTE  Pharmacy Consult for Heparin Infusion Indication: chest pain/ACS  Allergies  Allergen Reactions   Atorvastatin Other (See Comments)   Codeine    Demerol [Meperidine]    Polysaccharide Iron Complex Other (See Comments)    Caused constipation   Naproxen Rash    Caused rash on mouth    Patient Measurements: Height: 5\' 1"  (154.9 cm) Weight: 58.2 kg (128 lb 3.2 oz) IBW/kg (Calculated) : 47.8 Heparin Dosing Weight: 59 kg  Vital Signs: Temp: 98 F (36.7 C) (02/04 0417) Temp Source: Oral (02/04 0417) BP: 120/38 (02/04 0417) Pulse Rate: 62 (02/04 0417)  Labs: Recent Labs    09/12/22 0656 09/12/22 0656 09/12/22 1124 09/12/22 1736 09/13/22 0100 09/13/22 0223 09/13/22 0354 09/13/22 0448 09/13/22 0716 09/13/22 1053 09/13/22 1657 09/13/22 1952 09/14/22 0614  HGB 7.9*  --   --   --  6.4* 6.6*  --   --    < >  --  8.6*  --  8.3*  HCT 26.0*  --   --   --  20.6*  --   --   --   --   --  26.5*  --  25.7*  PLT 221  --   --   --  171  --   --   --   --   --  183  --  175  APTT  --   --   --  >200*  --   --   --   --   --   --   --   --   --   LABPROT 14.0  --   --   --   --   --   --   --   --   --   --   --   --   INR 1.1  --   --   --   --   --   --   --   --   --   --   --   --   HEPARINUNFRC  --    < >  --   --  0.23*  --   --   --   --  0.11*  --  0.10* 0.36  CREATININE 2.27*  --   --   --  2.27*  --   --   --   --   --   --   --   --   TROPONINIHS 40*  --    < >  --   --  2,504* 2,271* 2,121*  --   --   --   --   --    < > = values in this interval not displayed.     Estimated Creatinine Clearance: 13 mL/min (A) (by C-G formula based on SCr of 2.27 mg/dL (H)).   Medical History: Past Medical History:  Diagnosis Date   CHF (congestive heart failure) (HCC)    Diabetes mellitus without complication (HCC)    GERD (gastroesophageal reflux disease)    Hypertension    Insomnia    Iron deficiency anemia    RLS (restless legs syndrome)      Medications:  Scheduled:   aspirin EC  81 mg Oral Daily   carvedilol  3.125 mg Oral BID WC   doxycycline  100 mg Oral Q12H   ferrous sulfate  325 mg Oral Daily   furosemide  40 mg Intravenous BID   isosorbide mononitrate  60  mg Oral Daily   melatonin  10 mg Oral QHS   pantoprazole  40 mg Oral Daily   rosuvastatin  5 mg Oral Daily   sodium chloride flush  3 mL Intravenous Q12H   traZODone  50 mg Oral QHS   Infusions:   sodium chloride     heparin 1,050 Units/hr (09/13/22 2112)   PRN: sodium chloride, acetaminophen, guaiFENesin-dextromethorphan, hydrALAZINE, ondansetron (ZOFRAN) IV, sodium chloride flush  Assessment: Lisa Mcneil is a 89 y.o. female presenting with SOB. PMH significant for HFpEF, CAD, chronic IDA, HTN, CKD4, T2DM. Patient was not on [facility] PTA per chart review. Patient was receiving heparin 5000 units SQ Q12H for DVT ppx (last dose 2/2 @ 6295). Patient was started on heparin infusion for suspected NSTEMI and Hgb dropped from 7.9 to 6.4 in ~20 hours. Per discussion with MD, goal range was adjusted and will avoid boluses. Pharmacy has been consulted to initiate and manage heparin infusion.   Baseline Labs: PT 14.0, INR 1.1, Hgb 7.9, Hct 26.0, Plt 221   Goal of Therapy:  Heparin level 0.3-0.5 units/ml, no boluses Monitor platelets by anticoagulation protocol: Yes   Date Time HL Rate/Comment  2/3 0100 0.23 700/subtherapeutic 2/3 1053 0.11 750/subtherapeutic 2/3 1952 0.10 850/subtherapeutic 2/4 0614 0.36 1050 units/therapeutic  Plan:  Heparin therapeutic  Continue heparin infusion at 1050 units/hr Check HL 8 hours to confirm Continue to monitor H&H and platelets daily while on heparin infusion   Dorothe Pea, PharmD, BCPS Clinical Pharmacist   09/14/2022 7:00 AM

## 2022-09-14 NOTE — Assessment & Plan Note (Signed)
Patient given a unit of blood on 2/3.

## 2022-09-14 NOTE — Assessment & Plan Note (Signed)
With severe aortic stenosis.  Received Lasix 40 mg IV twice daily here in the hospital.  Cardiology to set up for outpatient cardiac catheterization and referral for TAVR.  Continue Coreg.  Can go back on Lasix 40 mg p.o. twice daily at home.

## 2022-09-14 NOTE — Assessment & Plan Note (Signed)
Heparin drip will end today at 5 PM.  On aspirin and Crestor.  Continue Coreg.

## 2022-09-14 NOTE — Progress Notes (Signed)
Progress Note   [name] QPY:[id number] DOB: [birth year] DOA: 09/12/2022     2 DOS: the patient was seen and examined on 09/14/2022   Brief hospital course: 89 y.o. female with medical history significant of history of chronic HFpEF secondary to moderate-severe AS and moderate MR, CAD, chronic iron deficiency anemia, HTN, CKD stage IV, IIDM, came for worsening of cough and shortness of breath.   Symptoms started 5 days ago, with initial exertional dyspnea progressively getting worse, in addition, she also has had productive cough with vith white thin phlegm for the last 3 to 4 days, denies any chest pain no fever chills no significant peripheral swelling.  Last night she also developed orthopnea and could not lie flat.   ED Course: Patient was found to be in respiratory distress and hypoxia 73% O2 saturation on room air, blood pressure stable, chest x-ray showed bilateral pulmonary congestion with suspected left upper infiltrates.  And patient was started on antibiotics and IV Lasix in the ED  2/3 given a unit of blood for hemoglobin of 6.4. 2/4.  Patient on heparin drip till 5 PM which we completed 48 hours.  Patient was on high flow nasal cannula 6 L and I dialed her down to 4 L.  Hopefully can we can change to regular nasal cannula and titrate off before disposition.  Assessment and Plan: * Acute hypoxic respiratory failure (HCC) Pulse ox of 73% on presentation.  Secondary to CHF exacerbation.  Patient on high flow nasal cannula 6 L when I saw her this morning and I dialed her down to 4.  Asked nursing staff to continue to titrate off.  Acute on chronic diastolic (congestive) heart failure (HCC) With severe aortic stenosis.  Continue Lasix twice daily IV dosing.  NSTEMI (non-ST elevated myocardial infarction) (HCC) Heparin drip will end today at 5 PM.  On aspirin and Crestor.  Continue Coreg.  Iron deficiency anemia due to chronic blood loss Patient given a unit of blood  on 2/3.  Essential hypertension Continue low-dose Coreg  CKD (chronic kidney disease) stage 4, GFR 15-29 ml/min (HCC) Creatinine 2.03 with a GFR of 23 today  Hyponatremia Likely secondary to heart failure.        Subjective: Patient feeling better and interested in going home.  Still on high flow nasal cannula 6 L as of this morning.  Came in with acute CHF and NSTEMI.  Physical Exam: Vitals:   09/14/22 0008 09/14/22 0417 09/14/22 0811 09/14/22 1200  BP: (!) 123/44 (!) 120/38 (!) 144/41 (!) 114/44  Pulse: 68 62 64 60  Resp: 18  (!) 23 18  Temp: 98.1 F (36.7 C) 98 F (36.7 C) 98 F (36.7 C) 99.1 F (37.3 C)  TempSrc: Oral Oral  Oral  SpO2: 96% 95% 97% 95%  Weight:      Height:       Physical Exam HENT:     Head: Normocephalic.     Mouth/Throat:     Pharynx: No oropharyngeal exudate.  Eyes:     General: Lids are normal.     Conjunctiva/sclera: Conjunctivae normal.  Cardiovascular:     Rate and Rhythm: Normal rate and regular rhythm.     Heart sounds: S1 normal and S2 normal. Murmur heard.     Systolic murmur is present with a grade of 4/6.  Pulmonary:     Breath sounds: Examination of the right-lower field reveals decreased breath sounds and rales. Examination of the left-lower field reveals decreased breath  sounds and rales. Decreased breath sounds and rales present. No wheezing or rhonchi.  Abdominal:     Palpations: Abdomen is soft.     Tenderness: There is no abdominal tenderness.  Musculoskeletal:     Right ankle: No swelling.     Left ankle: No swelling.  Skin:    General: Skin is warm.     Findings: No rash.  Neurological:     Mental Status: She is alert and oriented to person, place, and time.     Data Reviewed: Creatinine 2.03, sodium 133, troponin peaked at 2504 came down to 2121, last hemoglobin 8.3, white blood cell count 6.3, platelet count 175  Family Communication: Spoke with daughter-in-law on the phone  Disposition: Status is:  Inpatient Remains inpatient appropriate because: On heparin drip till this evening.  On high flow nasal cannula as of this morning.  Trying to get patient off oxygen.  Planned Discharge Destination: Home    Time spent: 28 minutes  Author: Loletha Grayer, MD 09/14/2022 2:00 PM  For on call review [email or web address].

## 2022-09-14 NOTE — Assessment & Plan Note (Signed)
Pulse ox of 73% on presentation.  Secondary to CHF exacerbation.  Patient on high flow nasal cannula 6 L yesterday.  Today able to taper off oxygen at rest with good saturation but with walking did drop down to 87% with me and with nursing staff did drop back down into the 80s also.  Continue diuresis today.  Trying to get off oxygen.

## 2022-09-14 NOTE — Assessment & Plan Note (Signed)
Sodium up in the normal range.

## 2022-09-14 NOTE — Progress Notes (Signed)
SUBJECTIVE: Patient is feeling much better eating lunch without any chest pain shortness of breath orthopnea PND.   Vitals:   09/14/22 0008 09/14/22 0417 09/14/22 0811 09/14/22 1200  BP: (!) 123/44 (!) 120/38 (!) 144/41 (!) 114/44  Pulse: 68 62 64 60  Resp: 18  (!) 23 18  Temp: 98.1 F (36.7 C) 98 F (36.7 C) 98 F (36.7 C) 99.1 F (37.3 C)  TempSrc: Oral Oral  Oral  SpO2: 96% 95% 97% 95%  Weight:      Height:        Intake/Output Summary (Last 24 hours) at 09/14/2022 1306 Last data filed at 09/14/2022 1120 Gross per 24 hour  Intake 410 ml  Output 600 ml  Net -190 ml    LABS: Basic Metabolic Panel: Recent Labs    09/13/22 0100 09/14/22 0614  NA 129* 133*  K 3.8 3.7  CL 95* 98  CO2 25 27  GLUCOSE 169* 173*  BUN 43* 46*  CREATININE 2.27* 2.03*  CALCIUM 8.5* 8.7*   Liver Function Tests: Recent Labs    09/12/22 0656  AST 20  ALT 16  ALKPHOS 50  BILITOT 0.7  PROT 7.0  ALBUMIN 3.7   Recent Labs    09/12/22 0656  LIPASE 35   CBC: Recent Labs    09/12/22 0656 09/13/22 0100 09/13/22 1657 09/14/22 0614  WBC 10.0   < > 7.4 6.3  NEUTROABS 8.4*  --   --   --   HGB 7.9*   < > 8.6* 8.3*  HCT 26.0*   < > 26.5* 25.7*  MCV 107.4*   < > 100.0 99.2  PLT 221   < > 183 175   < > = values in this interval not displayed.   Cardiac Enzymes: No results for input(s): "CKTOTAL", "CKMB", "CKMBINDEX", "TROPONINI" in the last 72 hours. BNP: Invalid input(s): "POCBNP" D-Dimer: No results for input(s): "DDIMER" in the last 72 hours. Hemoglobin A1C: No results for input(s): "HGBA1C" in the last 72 hours. Fasting Lipid Panel: No results for input(s): "CHOL", "HDL", "LDLCALC", "TRIG", "CHOLHDL", "LDLDIRECT" in the last 72 hours. Thyroid Function Tests: No results for input(s): "TSH", "T4TOTAL", "T3FREE", "THYROIDAB" in the last 72 hours.  Invalid input(s): "FREET3" Anemia Panel: Recent Labs    09/12/22 0659 09/12/22 0859 09/13/22 0059  VITAMINB12  --  289  --    TIBC 385  --   --   IRON 17*  --   --   RETICCTPCT  --   --  7.2*     PHYSICAL EXAM General: Well developed, well nourished, in no acute distress HEENT:  Normocephalic and atramatic Neck:  No JVD.  Lungs: Clear bilaterally to auscultation and percussion. Heart: HRRR . Normal S1 and S2 without gallops or murmurs.  Abdomen: Bowel sounds are positive, abdomen soft and non-tender  Msk:  Back normal, normal gait. Normal strength and tone for age. Extremities: No clubbing, cyanosis or edema.   Neuro: Alert and oriented X 3. Psych:  Good affect, responds appropriately  TELEMETRY: Normal sinus rhythm about 80 bpm  ASSESSMENT AND PLAN: Severe aortic stenosis with preserved left ventricular systolic function/non-STEMI and HFpEF.  I discussed with the patient about TAVR versus SAVR/CABG and patient is not interested and wants to be treated medically.  Thus no indication for cardiac catheterization.  Patient can be discharged once GI clears the patient on Plavix and heparin can be discontinued after 48 hours are complete.  Already gave the order to discontinue  heparin at the end of 48 hours today.  Principal Problem:   CHF (congestive heart failure) (HCC) Active Problems:   Essential hypertension   Acute CHF (congestive heart failure) (HCC)   CKD (chronic kidney disease) stage 4, GFR 15-29 ml/min (HCC)   Acute on chronic diastolic (congestive) heart failure ([place])    Neoma Laming, MD, [facility] 09/14/2022 1:06 PM

## 2022-09-14 NOTE — Hospital Course (Signed)
89 y.o. female with medical history significant of history of chronic HFpEF secondary to moderate-severe AS and moderate MR, CAD, chronic iron deficiency anemia, HTN, CKD stage IV, IIDM, came for worsening of cough and shortness of breath.   Symptoms started 5 days ago, with initial exertional dyspnea progressively getting worse, in addition, she also has had productive cough with vith white thin phlegm for the last 3 to 4 days, denies any chest pain no fever chills no significant peripheral swelling.  Last night she also developed orthopnea and could not lie flat.   ED Course: Patient was found to be in respiratory distress and hypoxia 73% O2 saturation on room air, blood pressure stable, chest x-ray showed bilateral pulmonary congestion with suspected left upper infiltrates.  And patient was started on antibiotics and IV Lasix in the ED  2/3 given a unit of blood for hemoglobin of 6.4. 2/4.  Patient on heparin drip till 5 PM which we completed 48 hours.  Patient was on high flow nasal cannula 6 L and I dialed her down to 4 L.  Hopefully can we can change to regular nasal cannula and titrate off before disposition. 2/5.  Patient hypoxic down to 87% with ambulation with me in the hypoxic down in the 80s with the nursing staff with walking today.  Patient complains of some left foot pain and uric acid elevated at 13.3.

## 2022-09-14 NOTE — Assessment & Plan Note (Signed)
Continue low-dose Coreg

## 2022-09-14 NOTE — Assessment & Plan Note (Addendum)
Creatinine 1.88 with a GFR of 25 upon discharge.

## 2022-09-15 DIAGNOSIS — I214 Non-ST elevation (NSTEMI) myocardial infarction: Secondary | ICD-10-CM | POA: Diagnosis not present

## 2022-09-15 DIAGNOSIS — M10272 Drug-induced gout, left ankle and foot: Secondary | ICD-10-CM

## 2022-09-15 DIAGNOSIS — I350 Nonrheumatic aortic (valve) stenosis: Secondary | ICD-10-CM | POA: Diagnosis not present

## 2022-09-15 DIAGNOSIS — J9601 Acute respiratory failure with hypoxia: Secondary | ICD-10-CM | POA: Diagnosis not present

## 2022-09-15 DIAGNOSIS — I5033 Acute on chronic diastolic (congestive) heart failure: Secondary | ICD-10-CM | POA: Diagnosis not present

## 2022-09-15 DIAGNOSIS — M109 Gout, unspecified: Secondary | ICD-10-CM | POA: Insufficient documentation

## 2022-09-15 LAB — BASIC METABOLIC PANEL
Anion gap: 11 (ref 5–15)
BUN: 46 mg/dL — ABNORMAL HIGH (ref 8–23)
CO2: 28 mmol/L (ref 22–32)
Calcium: 9.2 mg/dL (ref 8.9–10.3)
Chloride: 96 mmol/L — ABNORMAL LOW (ref 98–111)
Creatinine, Ser: 1.93 mg/dL — ABNORMAL HIGH (ref 0.44–1.00)
GFR, Estimated: 24 mL/min — ABNORMAL LOW (ref >60)
Glucose, Bld: 168 mg/dL — ABNORMAL HIGH (ref 70–99)
Potassium: 3.7 mmol/L (ref 3.5–5.1)
Sodium: 135 mmol/L (ref 135–145)

## 2022-09-15 LAB — CBC
HCT: 26.9 % — ABNORMAL LOW (ref 36.0–46.0)
Hemoglobin: 8.7 g/dL — ABNORMAL LOW (ref 12.0–15.0)
MCH: 31.8 pg (ref 26.0–34.0)
MCHC: 32.3 g/dL (ref 30.0–36.0)
MCV: 98.2 fL (ref 80.0–100.0)
Platelets: 207 10*3/uL (ref 150–400)
RBC: 2.74 MIL/uL — ABNORMAL LOW (ref 3.87–5.11)
RDW: 14 % (ref 11.5–15.5)
WBC: 6.1 10*3/uL (ref 4.0–10.5)
nRBC: 0 % (ref 0.0–0.2)

## 2022-09-15 LAB — URIC ACID: Uric Acid, Serum: 13.3 mg/dL — ABNORMAL HIGH (ref 2.5–7.1)

## 2022-09-15 LAB — MYCOPLASMA PNEUMONIAE ANTIBODY, IGM: Mycoplasma pneumo IgM: <770 U/mL (ref 0–769)

## 2022-09-15 MED ORDER — COLCHICINE 0.6 MG PO TABS
0.6000 mg | ORAL_TABLET | Freq: Once | ORAL | Status: AC
  Administered 2022-09-15: 0.6 mg via ORAL
  Filled 2022-09-15: qty 1

## 2022-09-15 MED ORDER — METHYLPREDNISOLONE SODIUM SUCC 40 MG IJ SOLR
20.0000 mg | Freq: Once | INTRAMUSCULAR | Status: AC
  Administered 2022-09-15: 20 mg via INTRAVENOUS
  Filled 2022-09-15: qty 1

## 2022-09-15 MED ORDER — ROSUVASTATIN CALCIUM 10 MG PO TABS
10.0000 mg | ORAL_TABLET | Freq: Every day | ORAL | Status: DC
  Administered 2022-09-15 – 2022-09-16 (×2): 10 mg via ORAL
  Filled 2022-09-15 (×2): qty 1

## 2022-09-15 NOTE — Progress Notes (Signed)
[facility] NOTE       Patient ID: Lisa Mcneil MRN: [id number] DOB/AGE: [birth year] 89 y.o.  Admit date: 09/12/2022 Referring Physician Dr. Wynetta Fines Primary Physician Dr. Netty Starring Primary Cardiologist Dr. Clayborn Bigness Reason for Consultation AoCHF, elevated troponin  HPI: Lisa Mcneil is a 89yoF with a PMH of HFpEF (EF 60-65%, mild LVH, g2dd 09/2022), severe AS (peak grad 65.6 mmHg), mild MR, DM2, HTN, IDA, HLD, CKD 3 who presented to [facility] ED 09/12/22 with shortness of breath and cough 5 days and developed orthopnea the night prior to presentation. Hypoxic to 73% on room air requiring supplemental [place] O2, anemic with Hgb low 6.4 s/p 2 units PRBCs. Troponins peaked at 2500 and completed 48 hours of heparin on 2/4 as the patient preferred for conservative management. Echo this admission showed progressive worsening of AS, now severe. Ongoing diuresis for AoCHF.   Interval History :  - feels great today, ambulated to the restroom on room air - remains with occasional productive cough, although much improving.  - no chest pain or peripheral edema - eager to go home   Review of systems complete and found to be negative unless listed above     Past Medical History:  Diagnosis Date   CHF (congestive heart failure) (HCC)    Diabetes mellitus without complication (HCC)    GERD (gastroesophageal reflux disease)    Hypertension    Insomnia    Iron deficiency anemia    RLS (restless legs syndrome)     Past Surgical History:  Procedure Laterality Date   BLADDER REPAIR     CATARACT EXTRACTION W/PHACO Right 05/13/2016   Procedure: CATARACT EXTRACTION PHACO AND INTRAOCULAR LENS PLACEMENT ([place]);  Surgeon: Birder Robson, MD;  Location: [facility] ORS;  Service: Ophthalmology;  Laterality: Right;  [place] 1.09 AP% 21.0 CDE 14.60 Fluid Pack Lot # [id number]   CATARACT EXTRACTION W/PHACO Left 07/15/2016   Procedure: CATARACT EXTRACTION PHACO AND INTRAOCULAR LENS PLACEMENT  (IOC);  Surgeon: Birder Robson, MD;  Location: [facility] ORS;  Service: Ophthalmology;  Laterality: Left;  Lot# [id number] H [place]: 01:17.9 AP%:27.8 CDE: 21.67     Medications Prior to Admission  Medication Sig Dispense Refill Last Dose   amLODipine (NORVASC) 10 MG tablet Take 10 mg by mouth daily.   09/11/2022   aspirin EC 81 MG tablet Take 1 tablet (81 mg total) by mouth daily. Swallow whole. 30 tablet 12 09/11/2022   ferrous sulfate 325 (65 FE) MG tablet Take 325 mg by mouth daily.   09/11/2022   furosemide (LASIX) 40 MG tablet Take 1 tablet (40 mg total) by mouth 2 (two) times daily. 60 tablet 0 09/11/2022   gabapentin (NEURONTIN) 100 MG capsule Take 100 mg by mouth at bedtime.   09/11/2022   isosorbide mononitrate (IMDUR) 60 MG 24 hr tablet Take 1 tablet (60 mg total) by mouth daily. 30 tablet 0 09/11/2022   Melatonin 10 MG TABS Take 20 mg by mouth at bedtime.   09/11/2022   metoprolol tartrate (LOPRESSOR) 25 MG tablet Take 1 tablet (25 mg total) by mouth 2 (two) times daily. 60 tablet 0 09/11/2022 at 1900   Multiple Vitamins-Minerals (MULTIVITAMIN WITH MINERALS) tablet Take 1 tablet by mouth daily.   09/11/2022   nitroGLYCERIN (NITROSTAT) 0.4 MG SL tablet Place 1 tablet (0.4 mg total) under the tongue every 5 (five) minutes as needed for chest pain. 20 tablet 12 unk   omeprazole (PRILOSEC) 20 MG capsule Take 20 mg by mouth daily.   09/11/2022  pramipexole (MIRAPEX) 0.25 MG tablet Take 0.25 mg by mouth in the morning and at bedtime.   09/11/2022   rosuvastatin (CRESTOR) 5 MG tablet Take 5 mg by mouth at bedtime.   09/11/2022   traZODone (DESYREL) 50 MG tablet Take 50 mg by mouth at bedtime.   09/11/2022   estradiol (ESTRACE) 0.1 MG/GM vaginal cream SMARTSIG:Gram(s) Vaginal Twice a Week (Patient not taking: Reported on 09/12/2022)   Completed Course   Social History   Socioeconomic History   Marital status: Single    Spouse name: Not on file   Number of children: Not on file   Years of education: Not on file    Highest education level: Not on file  Occupational History   Not on file  Tobacco Use   Smoking status: Former   Smokeless tobacco: Never  Vaping Use   Vaping Use: Never used  Substance and Sexual Activity   Alcohol use: No   Drug use: No   Sexual activity: Not on file  Other Topics Concern   Not on file  Social History Narrative   Not on file   Social Determinants of Health   Financial Resource Strain: Not on file  Food Insecurity: No Food Insecurity (09/12/2022)   Hunger Vital Sign    Worried About Running Out of Food in the Last Year: Never true    Ran Out of Food in the Last Year: Never true  Transportation Needs: No Transportation Needs (09/12/2022)   PRAPARE - Hydrologist (Medical): No    Lack of Transportation (Non-Medical): No  Physical Activity: Not on file  Stress: Not on file  Social Connections: Not on file  Intimate Partner Violence: Not At Risk (09/12/2022)   Humiliation, Afraid, Rape, and Kick questionnaire    Fear of Current or Ex-Partner: No    Emotionally Abused: No    Physically Abused: No    Sexually Abused: No    History reviewed. No pertinent family history.    Intake/Output Summary (Last 24 hours) at 09/15/2022 0757 Last data filed at 09/14/2022 2246 Gross per 24 hour  Intake --  Output 1400 ml  Net -1400 ml    Vitals:   09/14/22 1600 09/14/22 2001 09/15/22 0000 09/15/22 0718  BP: (!) 135/99 (!) 132/44 (!) 127/48 (!) 114/43  Pulse: 72 65 61 66  Resp: 16 16 16 19   Temp: 98 F (36.7 C) 98.2 F (36.8 C) 98.4 F (36.9 C) 98.9 F (37.2 C)  TempSrc: Oral Oral Oral Axillary  SpO2: 94% 94% 95% 90%  Weight:      Height:        PHYSICAL EXAM General: pleasant elderly and thin caucasian female, in no acute distress. Sitting upright in recliner without family at bedside. HEENT:  Normocephalic and atraumatic. Neck:  No JVD.  Lungs: Normal respiratory effort on room air, [place] at bedside. Trace crackles R base, no  wheezing Heart: HRRR . Normal S1 and soft S2 . 4/6 systolic murmur heard throughout with radiation to R carotid Abdomen: Non-distended appearing.  Msk: Normal strength and tone for age. Extremities: Warm and well perfused. No clubbing, cyanosis. No peripheral edema.  Neuro: Alert and oriented X 3. Psych:  Answers questions appropriately.   Labs: Basic Metabolic Panel: Recent Labs    09/14/22 0614 09/15/22 0528  NA 133* 135  K 3.7 3.7  CL 98 96*  CO2 27 28  GLUCOSE 173* 168*  BUN 46* 46*  CREATININE 2.03* 1.93*  CALCIUM 8.7* 9.2   Liver Function Tests: No results for input(s): "AST", "ALT", "ALKPHOS", "BILITOT", "PROT", "ALBUMIN" in the last 72 hours. No results for input(s): "LIPASE", "AMYLASE" in the last 72 hours. CBC: Recent Labs    09/14/22 0614 09/15/22 0528  WBC 6.3 6.1  HGB 8.3* 8.7*  HCT 25.7* 26.9*  MCV 99.2 98.2  PLT 175 207   Cardiac Enzymes: Recent Labs    09/13/22 0223 09/13/22 0354 09/13/22 0448  TROPONINIHS 2,504* 2,271* 2,121*   BNP: No results for input(s): "BNP" in the last 72 hours. D-Dimer: No results for input(s): "DDIMER" in the last 72 hours. Hemoglobin A1C: No results for input(s): "HGBA1C" in the last 72 hours. Fasting Lipid Panel: No results for input(s): "CHOL", "HDL", "LDLCALC", "TRIG", "CHOLHDL", "LDLDIRECT" in the last 72 hours. Thyroid Function Tests: No results for input(s): "TSH", "T4TOTAL", "T3FREE", "THYROIDAB" in the last 72 hours.  Invalid input(s): "FREET3" Anemia Panel: Recent Labs    09/12/22 0859 09/13/22 0059  VITAMINB12 289  --   RETICCTPCT  --  7.2*     Radiology: ECHOCARDIOGRAM COMPLETE  Result Date: 09/13/2022    ECHOCARDIOGRAM REPORT   Patient Name:   Lisa Mcneil Date of Exam: 09/13/2022 Medical Rec #:  [id number]          Height:       61.0 in Accession #:    [id number]         Weight:       128.2 lb Date of Birth:  1928/07/06         BSA:          1.563 m Patient Age:    33 years           BP:            104/77 mmHg Patient Gender: F                  HR:           69 bpm. Exam Location:  [facility] Procedure: 2D Echo Indications:     NSTEMI I21.4  History:         Patient has prior history of Echocardiogram examinations, most                  recent 06/15/2022.  Sonographer:     Kathlen Brunswick RDCS Referring Phys:  [id number] Lequita Halt Diagnosing Phys: Neoma Laming  Sonographer Comments: Image acquisition challenging due to respiratory motion. This was a challenging study due to patient coughing the entire test. IMPRESSIONS  1. Left ventricular ejection fraction, by estimation, is 60 to 65%. The left ventricle has normal function. The left ventricle has no regional wall motion abnormalities. There is severe concentric left ventricular hypertrophy. Left ventricular diastolic  parameters are consistent with Grade II diastolic dysfunction (pseudonormalization).  2. Right ventricular systolic function is moderately reduced. The right ventricular size is moderately enlarged. Moderately increased right ventricular wall thickness. There is mildly elevated pulmonary artery systolic pressure.  3. Left atrial size was severely dilated.  4. Right atrial size was moderately dilated.  5. The mitral valve is normal in structure. Mild mitral valve regurgitation. No evidence of mitral stenosis.  6. Tricuspid valve regurgitation is moderate.  7. The aortic valve is calcified. Aortic valve regurgitation is mild. Severe aortic valve stenosis.  8. The inferior vena cava is normal in size with greater than 50% respiratory variability, suggesting right atrial pressure of 3 mmHg. FINDINGS  Left Ventricle: Left  ventricular ejection fraction, by estimation, is 60 to 65%. The left ventricle has normal function. The left ventricle has no regional wall motion abnormalities. The left ventricular internal cavity size was normal in size. There is  severe concentric left ventricular hypertrophy. Left ventricular diastolic parameters are  consistent with Grade II diastolic dysfunction (pseudonormalization). Right Ventricle: The right ventricular size is moderately enlarged. Moderately increased right ventricular wall thickness. Right ventricular systolic function is moderately reduced. There is mildly elevated pulmonary artery systolic pressure. Left Atrium: Left atrial size was severely dilated. Right Atrium: Right atrial size was moderately dilated. Pericardium: There is no evidence of pericardial effusion. Mitral Valve: The mitral valve is normal in structure. Mild to moderate mitral annular calcification. Mild mitral valve regurgitation. No evidence of mitral valve stenosis. Tricuspid Valve: The tricuspid valve is normal in structure. Tricuspid valve regurgitation is moderate . No evidence of tricuspid stenosis. Aortic Valve: The aortic valve is calcified. Aortic valve regurgitation is mild. Severe aortic stenosis is present. Aortic valve mean gradient measures 35.2 mmHg. Aortic valve peak gradient measures 65.6 mmHg. Aortic valve area, by VTI measures 0.74 cm. Pulmonic Valve: The pulmonic valve was normal in structure. Pulmonic valve regurgitation is mild. No evidence of pulmonic stenosis. Aorta: The aortic root is normal in size and structure. Venous: The inferior vena cava is normal in size with greater than 50% respiratory variability, suggesting right atrial pressure of 3 mmHg. IAS/Shunts: No atrial level shunt detected by color flow Doppler.  LEFT VENTRICLE PLAX 2D LVIDd:         4.60 cm   Diastology LVIDs:         3.10 cm   LV e' medial:    4.13 cm/s LV PW:         1.30 cm   LV E/e' medial:  34.4 LV IVS:        1.30 cm   LV e' lateral:   8.16 cm/s LVOT diam:     1.80 cm   LV E/e' lateral: 17.4 LV SV:         73 LV SV Index:   47 LVOT Area:     2.54 cm  RIGHT VENTRICLE RV Basal diam:  3.50 cm RV S prime:     14.10 cm/s TAPSE (M-mode): 3.0 cm LEFT ATRIUM             Index        RIGHT ATRIUM           Index LA diam:        3.70 cm 2.37  cm/m   RA Area:     12.60 cm LA Vol (A2C):   62.4 ml 39.91 ml/m  RA Volume:   32.90 ml  21.04 ml/m LA Vol (A4C):   87.1 ml 55.71 ml/m LA Biplane Vol: 73.0 ml 46.69 ml/m  AORTIC VALVE                     PULMONIC VALVE AV Area (Vmax):    0.70 cm      PV Vmax:       1.25 m/s AV Area (Vmean):   0.64 cm      PV Peak grad:  6.2 mmHg AV Area (VTI):     0.74 cm AV Vmax:           405.00 cm/s AV Vmean:          276.400 cm/s AV VTI:  0.987 m AV Peak Grad:      65.6 mmHg AV Mean Grad:      35.2 mmHg LVOT Vmax:         111.00 cm/s LVOT Vmean:        70.000 cm/s LVOT VTI:          0.286 m LVOT/AV VTI ratio: 0.29  AORTA Ao Root diam: 2.90 cm Ao Asc diam:  2.80 cm MITRAL VALVE                TRICUSPID VALVE MV Area (PHT): 3.74 cm     TV Peak grad:   37.1 mmHg MV Decel Time: 203 msec     TV Vmax:        3.04 m/s MV E velocity: 142.00 cm/s MV A velocity: 104.00 cm/s  SHUNTS MV E/A ratio:  1.37         Systemic VTI:  0.29 m                             Systemic Diam: 1.80 cm Neoma Laming Electronically signed by Neoma Laming Signature Date/Time: 09/13/2022/11:31:22 AM    Final    DG Chest Port 1 View  Result Date: 09/12/2022 CLINICAL DATA:  Cough and hypoxia.  Shortness of breath. EXAM: PORTABLE CHEST 1 VIEW COMPARISON:  06/26/2022 FINDINGS: The cardio pericardial silhouette is enlarged. Diffuse interstitial opacity again noted suggesting edema. There is somewhat focal airspace disease in the right parahilar lung with a nodular opacity in the left mid lung. Tiny bilateral pleural effusions. Bones are diffusely demineralized. Telemetry leads overlie the chest. IMPRESSION: 1. Persistent interstitial opacity suggesting edema. 2. Somewhat focal airspace disease in the right parahilar lung with a nodular opacity in the left mid lung. These findings may reflect areas of asymmetric edema or multifocal infection. Given the nodular character to the left-sided opacity, follow-up is recommended to ensure resolution.  Reviewing chest CTA 06/13/2022, no left nodule or mass in this region on that study. Electronically Signed   By: Misty Stanley M.D.   On: 09/12/2022 07:21    TELEMETRY reviewed by me (LT) 09/15/2022 : NSR rate 60s-90s  EKG reviewed by me: NSR lateral ST depressions  Data reviewed by me (LT) 09/15/2022: last cardiology note, hospitalist progress note, admission H&P, cbc, bmp, bnp troponins, echo report    Principal Problem:   Acute hypoxic respiratory failure ([place]) Active Problems:   Iron deficiency anemia due to chronic blood loss   Essential hypertension   CKD (chronic kidney disease) stage 4, GFR 15-29 ml/min (HCC)   NSTEMI (non-ST elevated myocardial infarction) (HCC)   Acute on chronic diastolic (congestive) heart failure (HCC)   Hyponatremia   Aortic valve stenosis    ASSESSMENT AND PLAN:    NSTEMI, in the setting of acute on chronic HFpEF, the absence of chest pain, with normal ECG, patient with known moderate to severe aortic stenosis, chronic kidney disease stage IV Acute on chronic diastolic congestive heart failure, HFpEF, EF 60-65% 09/2022, good clinical response to IV furosemide. Appears euvolemic on exam apart from trace L sided crackles .  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  Agree with current therapy 2.  s/p IV heparin for 48  hours 3.  Continue aspirin, coreg 3.125 BID, imdur 60mg  daily  3.  Continue IV diuresis this morning, change diuretics to PO torsemide 40mg  once daily at discharge  4.  Carefully monitor renal function 5.  Discussed rationale/consideration for TAVR, patient seemed open to this in conversation today. Will arrange for follow up with Dr. Clayborn Bigness in 1-2 weeks for follow  up and scheduling TAVR eval & appropriate referral following.  [place] for discharge today following AM IV diuresis, if remains off supplemental O2, ambulating well.   This patient's plan of care was discussed and created with Dr. Clayborn Bigness  and he is in agreement.  Signed: Tristan Schroeder , PA-C 09/15/2022, 7:57 AM [facility]

## 2022-09-15 NOTE — Assessment & Plan Note (Addendum)
Not sure if this was acute gout in her foot because it responded very quickly.  I did give her 1 dose of Solu-Medrol and 1 dose of colchicine.  Her uric acid was elevated at 13.3.  Low purine diet recommended and will start low-dose allopurinol as outpatient in about 4 days.

## 2022-09-15 NOTE — Progress Notes (Signed)
Progress Note   [name] GMW:[id number] DOB: 09/29/1927 DOA: 09/12/2022     3 DOS: the patient was seen and examined on 09/15/2022   Brief hospital course: 89 y.o. female with medical history significant of history of chronic HFpEF secondary to moderate-severe AS and moderate MR, CAD, chronic iron deficiency anemia, HTN, CKD stage IV, IIDM, came for worsening of cough and shortness of breath.   Symptoms started 5 days ago, with initial exertional dyspnea progressively getting worse, in addition, she also has had productive cough with vith white thin phlegm for the last 3 to 4 days, denies any chest pain no fever chills no significant peripheral swelling.  Last night she also developed orthopnea and could not lie flat.   ED Course: Patient was found to be in respiratory distress and hypoxia 73% O2 saturation on room air, blood pressure stable, chest x-ray showed bilateral pulmonary congestion with suspected left upper infiltrates.  And patient was started on antibiotics and IV Lasix in the ED  2/3 given a unit of blood for hemoglobin of 6.4. 2/4.  Patient on heparin drip till 5 PM which we completed 48 hours.  Patient was on high flow nasal cannula 6 L and I dialed her down to 4 L.  Hopefully can we can change to regular nasal cannula and titrate off before disposition. 2/5.  Patient hypoxic down to 87% with ambulation with me in the hypoxic down in the 80s with the nursing staff with walking today.  Patient complains of some left foot pain and uric acid elevated at 13.3.  Assessment and Plan: * Acute hypoxic respiratory failure (HCC) Pulse ox of 73% on presentation.  Secondary to CHF exacerbation.  Patient on high flow nasal cannula 6 L yesterday.  Today able to taper off oxygen at rest with good saturation but with walking did drop down to 87% with me and with nursing staff did drop back down into the 80s also.  Continue diuresis today.  Trying to get off oxygen.  Acute on  chronic diastolic (congestive) heart failure (HCC) With severe aortic stenosis.  Continue Lasix twice daily IV dosing.  Cardiology to discuss TAVR and cardiac catheterization.  NSTEMI (non-ST elevated myocardial infarction) (HCC) Heparin drip ended yesterday.  On aspirin and Crestor.  Continue Coreg.  Iron deficiency anemia due to chronic blood loss Patient given a unit of blood on 2/3.  Essential hypertension Continue low-dose Coreg  CKD (chronic kidney disease) stage 4, GFR 15-29 ml/min (HCC) Creatinine 1.93 with a GFR of 24.  Acute gout Likely brought on with aggressive diuresis.  Will give a dose of colchicine and dose of Solu-Medrol today.  Uric acid elevated at 13.3.  Hyponatremia Sodium up in the normal range.        Subjective: Patient feeling okay with regards to her breathing.  No complaints of chest pain admitted with shortness of breath hypoxia and NSTEMI and heart failure with severe aortic stenosis.  Today complains of left foot pain.  Found to have elevated uric acid.  Physical Exam: Vitals:   09/15/22 1117 09/15/22 1355 09/15/22 1400 09/15/22 1405  BP: (!) 128/42     Pulse: 74     Resp: 20     Temp:      TempSrc:      SpO2: 93% 90% (!) 84% 90%  Weight:      Height:       Physical Exam HENT:     Head: Normocephalic.     Mouth/Throat:  Pharynx: No oropharyngeal exudate.  Eyes:     General: Lids are normal.     Conjunctiva/sclera: Conjunctivae normal.  Cardiovascular:     Rate and Rhythm: Normal rate and regular rhythm.     Heart sounds: S1 normal and S2 normal. Murmur heard.     Systolic murmur is present with a grade of 4/6.  Pulmonary:     Breath sounds: Examination of the right-lower field reveals decreased breath sounds. Examination of the left-lower field reveals decreased breath sounds. Decreased breath sounds present. No wheezing, rhonchi or rales.  Abdominal:     Palpations: Abdomen is soft.     Tenderness: There is no abdominal  tenderness.  Musculoskeletal:     Right ankle: No swelling.     Left ankle: No swelling.  Skin:    General: Skin is warm.     Findings: No rash.  Neurological:     Mental Status: She is alert and oriented to person, place, and time.     Data Reviewed: Uric acid 13.3, creatinine down to 1.93, sodium 135, hemoglobin 8.7 Family Communication: Spoke with daughter-in-law on the phone  Disposition: Status is: Inpatient Remains inpatient appropriate because: Unable to come off oxygen completely at this point still hypoxic with ambulation.  Continue diuresis today to see if we can get off oxygen.  Planned Discharge Destination: Home    Time spent: 29 minutes Case discussed with cardiology.  Author: Loletha Grayer, MD 09/15/2022 3:52 PM  For on call review [email or web address].

## 2022-09-15 NOTE — Care Management Important Message (Signed)
Important Message  Patient Details  Name: Lisa Mcneil MRN: [id number] Date of Birth: May 18, 1928   Medicare Important Message Given:  Yes     Dannette Barbara 09/15/2022, 3:38 PM

## 2022-09-15 NOTE — Progress Notes (Signed)
   Heart Failure Nurse Navigator Note  Met with patient today, she was sitting up in the chair at bedside.  She thinks that she is being discharged home and states that she feels 100% better.  She still has a congested sounding cough.  Discussed her diet, she states that she does not eat foods from the [employer] [employer] [facility] [employer] where she works.  She does have a neighbor that brings her an evening meal which may take her 3 days to eat.  Does not use salt feels that she sticks within her fluid restriction.  Discussed daily weights and what to report along with changes in symptoms.  She has follow-up in the outpatient heart failure clinic, on February 7 at 2 PM in the afternoon.  He states that she plans on taking this week off from work and returning next Monday the 12th.  Had no further questions.  Pricilla Riffle RN [facility]

## 2022-09-15 NOTE — Progress Notes (Signed)
Physical Therapy Treatment Patient Details Name: Lisa Mcneil MRN: [id number] DOB: March 04, 1928 Today's Date: 09/15/2022   History of Present Illness Past medical history of CHF, diabetes, GERD, hypertension, iron deficiency anemia who presents to the emergency department with 1 week of dry cough and then last night developed shortness of breath while sleeping.  Denies chest pain, denies chest tightness, denies abdominal pain nausea vomiting.  No other acute medical complaints.Does not wear home O2.    PT Comments    Nurse reports pt recently ambulated around nursing loop (on room air) and O2 sats decreased to 84% (pt 90% on room air at rest); nurse recommending walking pt on 2 L O2.  Pt agreeable to PT session and walking again.  Pt's O2 sats 90% at rest (on room air) beginning of session; O2 sats 93% or greater on 2 L O2 via nasal cannula during ambulation; and O2 sats 92% on room air end of session at rest.  Pt able to ambulate 200 feet (no AD use) with SBA (pt initially requiring vc's for pursed lip breathing in order to utilize supplemental O2); very minimal SOB noted.  No c/o pain.  Will continue to focus on strengthening, balance, and progressive functional mobility during hospitalization.   Recommendations for follow up therapy are one component of a multi-disciplinary discharge planning process, led by the attending physician.  Recommendations may be updated based on patient status, additional functional criteria and insurance authorization.  Follow Up Recommendations  No PT follow up     Assistance Recommended at Discharge None  Patient can return home with the following A little help with walking and/or transfers;A little help with bathing/dressing/bathroom   Equipment Recommendations  None recommended by PT    Recommendations for Other Services       Precautions / Restrictions Precautions Precautions: Fall Restrictions Weight Bearing Restrictions: No     Mobility   Bed Mobility Overal bed mobility: Independent Bed Mobility: Supine to Sit, Sit to Supine     Supine to sit: Independent Sit to supine: Independent   General bed mobility comments: no difficulties noted    Transfers Overall transfer level: Independent Equipment used: None Transfers: Sit to/from Stand Sit to Stand: Independent           General transfer comment: steady safe transfer from bed    Ambulation/Gait Ambulation/Gait assistance: Supervision Gait Distance (Feet): 200 Feet Assistive device: None Gait Pattern/deviations: Step-through pattern Gait velocity: mildly decreased     General Gait Details: steady ambulation   Stairs             Wheelchair Mobility    Modified Rankin (Stroke Patients Only)       Balance Overall balance assessment: Needs assistance Sitting-balance support: No upper extremity supported, Feet supported Sitting balance-Leahy Scale: Normal Sitting balance - Comments: steady sitting putting on shoes   Standing balance support: No upper extremity supported, During functional activity Standing balance-Leahy Scale: Good Standing balance comment: steady ambulating without UE support                            Cognition Arousal/Alertness: Awake/alert Behavior During Therapy: WFL for tasks assessed/performed Overall Cognitive Status: Within Functional Limits for tasks assessed                                          Exercises  General Comments  Nursing cleared pt for participation in physical therapy.  Pt agreeable to PT session.      Pertinent Vitals/Pain Pain Assessment Pain Assessment: No/denies pain HR WFL during sessions activities.    Home Living                          Prior Function            PT Goals (current goals can now be found in the care plan section) Acute Rehab PT Goals Patient Stated Goal: To return to work PT Goal Formulation: With patient Time  For Goal Achievement: 09/27/22 Potential to Achieve Goals: Good Progress towards PT goals: Progressing toward goals    Frequency    Min 2X/week      PT Plan Current plan remains appropriate    Co-evaluation              AM-PAC PT "6 Clicks" Mobility   Outcome Measure  Help needed turning from your back to your side while in a flat bed without using bedrails?: None Help needed moving from lying on your back to sitting on the side of a flat bed without using bedrails?: None Help needed moving to and from a bed to a chair (including a wheelchair)?: None Help needed standing up from a chair using your arms (e.g., wheelchair or bedside chair)?: None Help needed to walk in hospital room?: A Little Help needed climbing 3-5 steps with a railing? : A Little 6 Click Score: 22    End of Session Equipment Utilized During Treatment: Oxygen;Gait belt Activity Tolerance: Patient tolerated treatment well Patient left: in bed;with call bell/phone within reach Nurse Communication: Mobility status;Precautions PT Visit Diagnosis: Difficulty in walking, not elsewhere classified (R26.2)     Time: 1027-2536 PT Time Calculation (min) (ACUTE ONLY): 20 min  Charges:  $Therapeutic Exercise: 8-22 mins                     Leitha Bleak, PT 09/15/22, 3:20 PM

## 2022-09-15 NOTE — TOC Initial Note (Signed)
Transition of Care [facility]) - Initial/Assessment Note    Patient Details  Name: Lisa Mcneil MRN: [id number] Date of Birth: [birth year]  [facility] CM/SW Contact:    Candie Chroman, LCSW Phone Number: 09/15/2022, 1:09 PM  Clinical Narrative:    Readmission prevention screen complete. CSW met with patient. No supports at bedside. CSW introduced role and explained that discharge planning would be discussed. PCP is Dr. Netty Starring. Patient drives herself to appointments. She uses the [facility] on [address]. No issues obtaining medications. Patient lives home alone and works at a [employer]. No home health or DME use prior to admission. No further concerns. CSW encouraged patient to contact CSW as needed. CSW will continue to follow patient for support and facilitate return home once stable. Son will transport her home at discharge.              Expected Discharge Plan: Home/Self Care Barriers to Discharge: Continued Medical Work up   Patient Goals and CMS Choice            Expected Discharge Plan and Services     Post Acute Care Choice: NA Living arrangements for the past 2 months: Apartment                                      Prior Living Arrangements/Services Living arrangements for the past 2 months: Apartment Lives with:: Self Patient language and need for interpreter reviewed:: Yes Do you feel safe going back to the place where you live?: Yes      Need for Family Participation in Patient Care: Yes (Comment)     Criminal Activity/Legal Involvement Pertinent to Current Situation/Hospitalization: No - Comment as needed  Activities of Daily Living Home Assistive Devices/Equipment: Cane (specify quad or straight) ADL Screening (condition at time of admission) Patient's cognitive ability adequate to safely complete daily activities?: Yes Is the patient deaf or have difficulty hearing?: No Does the patient have  difficulty seeing, even when wearing glasses/contacts?: No Does the patient have difficulty concentrating, remembering, or making decisions?: No Patient able to express need for assistance with ADLs?: Yes Does the patient have difficulty dressing or bathing?: No Independently performs ADLs?: Yes (appropriate for developmental age) Does the patient have difficulty walking or climbing stairs?: Yes Weakness of Legs: Both Weakness of Arms/Hands: None  Permission Sought/Granted                  Emotional Assessment Appearance:: Appears stated age Attitude/Demeanor/Rapport: Engaged, Gracious Affect (typically observed): Accepting, Appropriate, Calm, Pleasant Orientation: : Oriented to Self, Oriented to Place, Oriented to Situation, Oriented to  Time Alcohol / Substance Use: Not Applicable Psych Involvement: No (comment)  Admission diagnosis:  CHF (congestive heart failure) (HCC) [I50.9] SOB (shortness of breath) [R06.02] Bronchitis [J40] Hypoxia [R09.02] Acute on chronic diastolic congestive heart failure (HCC) [I50.33] Patient Active Problem List   Diagnosis Date Noted   Acute hypoxic respiratory failure (HCC) 09/14/2022   Hyponatremia 09/14/2022   Aortic valve stenosis 09/14/2022   Acute on chronic diastolic (congestive) heart failure ([place]) 06/26/2022   Symptomatic anemia 06/26/2022   CAD (coronary artery disease) 06/26/2022   Myocardial injury 06/26/2022   Acute on chronic diastolic CHF (congestive heart failure) ([place]) 06/13/2022   NSTEMI (non-ST elevated myocardial infarction) ([place]) 06/13/2022   HLD (hyperlipidemia) 06/13/2022   Type II diabetes mellitus with renal manifestations ([place]) 06/13/2022  RLS (restless legs syndrome) 08/27/2021   Secondary hyperparathyroidism of renal origin ([place]) 07/25/2021   Anemia in chronic kidney disease 06/12/2021   Benign hypertensive kidney disease with chronic kidney disease 06/12/2021   Chronic diastolic CHF (congestive heart failure),  NYHA class 1 ([place]) 83/25/4982   Systolic murmur 64/15/8309   Localized osteoporosis without current pathological fracture 04/26/2019   CKD (chronic kidney disease) stage 3, GFR 30-59 ml/min (HCC) 04/20/2018   CKD (chronic kidney disease) stage 4, GFR 15-29 ml/min ([place]) 04/20/2018   Diabetes mellitus without complication ([place]) 40/76/8088   Essential hypertension 11/05/2017   Iron deficiency 11/05/2017   Mixed hyperlipidemia 11/05/2017   Primary insomnia 05/07/2017   GERD without esophagitis 08/22/2015   Iron deficiency anemia due to chronic blood loss 02/16/2015   PCP:  Dion Body, MD Pharmacy:   [facility] [address], [place] - [address] [address] Phone: [phone] Fax: [phone]  [place], [place] - [address] [place] [address] Phone: [phone] Fax: [phone]     Social Determinants of Health (SDOH) Social History: SDOH Screenings   Food Insecurity: No Food Insecurity (09/12/2022)  Housing: Low Risk  (09/12/2022)  Transportation Needs: No Transportation Needs (09/12/2022)  Utilities: Not At Risk (09/12/2022)  Tobacco Use: Medium Risk (09/12/2022)   SDOH Interventions:     Readmission Risk Interventions    09/15/2022    1:07 PM  Readmission Risk Prevention Plan  Transportation Screening Complete  PCP or Specialist Appt within 3-5 Days Complete  Social Work Consult for [place] Planning/Counseling Complete  Palliative Care Screening Not Applicable  Medication Review Press [employer]) Complete

## 2022-09-16 ENCOUNTER — Encounter: Payer: Self-pay | Admitting: Oncology

## 2022-09-16 DIAGNOSIS — I350 Nonrheumatic aortic (valve) stenosis: Secondary | ICD-10-CM | POA: Diagnosis not present

## 2022-09-16 DIAGNOSIS — J9601 Acute respiratory failure with hypoxia: Secondary | ICD-10-CM | POA: Diagnosis not present

## 2022-09-16 DIAGNOSIS — I214 Non-ST elevation (NSTEMI) myocardial infarction: Secondary | ICD-10-CM | POA: Diagnosis not present

## 2022-09-16 DIAGNOSIS — I5033 Acute on chronic diastolic (congestive) heart failure: Secondary | ICD-10-CM | POA: Diagnosis not present

## 2022-09-16 LAB — CBC
HCT: 28.3 % — ABNORMAL LOW (ref 36.0–46.0)
Hemoglobin: 9.2 g/dL — ABNORMAL LOW (ref 12.0–15.0)
MCH: 31.4 pg (ref 26.0–34.0)
MCHC: 32.5 g/dL (ref 30.0–36.0)
MCV: 96.6 fL (ref 80.0–100.0)
Platelets: 218 10*3/uL (ref 150–400)
RBC: 2.93 MIL/uL — ABNORMAL LOW (ref 3.87–5.11)
RDW: 13.6 % (ref 11.5–15.5)
WBC: 3.2 10*3/uL — ABNORMAL LOW (ref 4.0–10.5)
nRBC: 0 % (ref 0.0–0.2)

## 2022-09-16 LAB — BASIC METABOLIC PANEL
Anion gap: 11 (ref 5–15)
BUN: 50 mg/dL — ABNORMAL HIGH (ref 8–23)
CO2: 26 mmol/L (ref 22–32)
Calcium: 9.2 mg/dL (ref 8.9–10.3)
Chloride: 92 mmol/L — ABNORMAL LOW (ref 98–111)
Creatinine, Ser: 1.88 mg/dL — ABNORMAL HIGH (ref 0.44–1.00)
GFR, Estimated: 25 mL/min — ABNORMAL LOW (ref >60)
Glucose, Bld: 398 mg/dL — ABNORMAL HIGH (ref 70–99)
Potassium: 4.1 mmol/L (ref 3.5–5.1)
Sodium: 129 mmol/L — ABNORMAL LOW (ref 135–145)

## 2022-09-16 MED ORDER — FUROSEMIDE 40 MG PO TABS: 40.0000 mg | ORAL_TABLET | Freq: Two times a day (BID) | ORAL | 0 refills | Status: AC

## 2022-09-16 MED ORDER — ALLOPURINOL 100 MG PO TABS: 50.0000 mg | ORAL_TABLET | Freq: Every day | ORAL | 0 refills | Status: AC

## 2022-09-16 MED ORDER — CARVEDILOL 3.125 MG PO TABS: 3.1250 mg | ORAL_TABLET | Freq: Two times a day (BID) | ORAL | 0 refills | Status: AC

## 2022-09-16 NOTE — Progress Notes (Signed)
   Heart Failure Nurse Navigator Note  Met with patient today, she is sitting up in the chair on room air currently in no acute distress.  She states she is being discharged today.   Made aware of her heart failure clinic appointment tomorrow at 2 PM.  She was given the phone number and address of clinic.  She states she plans on being there.  By teach back method went over what to report as far as weight gain, changes in symptoms, fluid and dietary restrictions.  Needed very little re enforcement.  Lisa Mcneil CHFN.

## 2022-09-16 NOTE — Discharge Summary (Signed)
Physician Discharge Summary   [name] MRN: [id number] DOB: 07-Nov-1927  Admit date:     09/12/2022  Discharge date: 09/16/22  Discharge Physician: Loletha Grayer   PCP: Dion Body, MD   Recommendations at discharge:   Follow-up PCP 5 days Follow-up cardiology 1 week Follow-up CHF clinic and acute cardiac rehab  Discharge Diagnoses: Principal Problem:   Acute hypoxic respiratory failure ([place]) Active Problems:   Acute on chronic diastolic (congestive) heart failure (HCC)   NSTEMI (non-ST elevated myocardial infarction) (HCC)   Iron deficiency anemia due to chronic blood loss   Essential hypertension   CKD (chronic kidney disease) stage 4, GFR 15-29 ml/min (HCC)   Hyponatremia   Aortic valve stenosis   Acute gout    Hospital Course: 89 y.o. female with medical history significant of history of chronic HFpEF secondary to moderate-severe AS and moderate MR, CAD, chronic iron deficiency anemia, HTN, CKD stage IV, IIDM, came for worsening of cough and shortness of breath.   Symptoms started 5 days ago, with initial exertional dyspnea progressively getting worse, in addition, she also has had productive cough with vith white thin phlegm for the last 3 to 4 days, denies any chest pain no fever chills no significant peripheral swelling.  Last night she also developed orthopnea and could not lie flat.   ED Course: Patient was found to be in respiratory distress and hypoxia 73% O2 saturation on room air, blood pressure stable, chest x-ray showed bilateral pulmonary congestion with suspected left upper infiltrates.  And patient was started on antibiotics and IV Lasix in the ED  2/3 given a unit of blood for hemoglobin of 6.4. 2/4.  Patient on heparin drip till 5 PM which we completed 48 hours.  Patient was on high flow nasal cannula 6 L and I dialed her down to 4 L.  Hopefully can we can change to regular nasal cannula and titrate off before disposition. 2/5.   Patient hypoxic down to 87% with ambulation with me in the hypoxic down in the 80s with the nursing staff with walking today.  Patient complains of some left foot pain and uric acid elevated at 13.3.  Patient given a dose of colchicine and Solu-Medrol. 2/6.  Patient held her saturations with ambulating with me in the mid 90s.  Creatinine down to 1.88 with IV diuresis.  Case discussed with cardiology and they will set her up with an outpatient cardiac catheterization and evaluation for TAVR.  Assessment and Plan: * Acute hypoxic respiratory failure (HCC) Pulse ox of 73% on presentation.  Secondary to CHF exacerbation.  Patient on high flow nasal cannula 6 L 2 days ago.  Yesterday able to taper off oxygen at rest with good saturation but with walking did drop down to 87% with me and with nursing staff did drop back down into the 80s also.  Today able to hold her saturations in the mid 90s with ambulation with me.  Able to come off oxygen completely.  Acute on chronic diastolic (congestive) heart failure (HCC) With severe aortic stenosis.  Received Lasix 40 mg IV twice daily here in the hospital.  Cardiology to set up for outpatient cardiac catheterization and referral for TAVR.  Continue Coreg.  Can go back on Lasix 40 mg p.o. twice daily at home.  NSTEMI (non-ST elevated myocardial infarction) (HCC) Heparin drip for 48 hours.  On aspirin and Crestor.  Continue Coreg.  Iron deficiency anemia due to chronic blood loss Patient given a unit of  blood on 2/3.  Hemoglobin on discharge 9.2.  Essential hypertension Continue low-dose Coreg  CKD (chronic kidney disease) stage 4, GFR 15-29 ml/min (HCC) Creatinine 1.88 with a GFR of 25 upon discharge.  Acute gout Not sure if this was acute gout in her foot because it responded very quickly.  I did give her 1 dose of Solu-Medrol and 1 dose of colchicine.  Her uric acid was elevated at 13.3.  Low purine diet recommended and will start low-dose allopurinol as  outpatient in about 4 days.  Hyponatremia Sodium dipped down to 129.  Likely secondary to heart failure.  Continue to monitor as outpatient         Consultants: Cardiology Procedures performed: None Disposition: Home Diet recommendation:  Cardiac and Carb modified diet DISCHARGE MEDICATION: Allergies as of 09/16/2022       Reactions   Atorvastatin Other (See Comments)   Codeine    Demerol [meperidine]    Polysaccharide Iron Complex Other (See Comments)   Caused constipation   Naproxen Rash   Caused rash on mouth        Medication List     STOP taking these medications    amLODipine 10 MG tablet Commonly known as: NORVASC   estradiol 0.1 MG/GM vaginal cream Commonly known as: ESTRACE   metoprolol tartrate 25 MG tablet Commonly known as: LOPRESSOR       TAKE these medications    allopurinol 100 MG tablet Commonly known as: Zyloprim Take 0.5 tablets (50 mg total) by mouth daily. Start taking on: September 19, 2022   aspirin EC 81 MG tablet Take 1 tablet (81 mg total) by mouth daily. Swallow whole.   carvedilol 3.125 MG tablet Commonly known as: COREG Take 1 tablet (3.125 mg total) by mouth 2 (two) times daily with a meal.   ferrous sulfate 325 (65 FE) MG tablet Take 325 mg by mouth daily.   furosemide 40 MG tablet Commonly known as: Lasix Take 1 tablet (40 mg total) by mouth 2 (two) times daily.   gabapentin 100 MG capsule Commonly known as: NEURONTIN Take 100 mg by mouth at bedtime.   isosorbide mononitrate 60 MG 24 hr tablet Commonly known as: IMDUR Take 1 tablet (60 mg total) by mouth daily.   Melatonin 10 MG Tabs Take 20 mg by mouth at bedtime.   multivitamin with minerals tablet Take 1 tablet by mouth daily.   nitroGLYCERIN 0.4 MG SL tablet Commonly known as: NITROSTAT Place 1 tablet (0.4 mg total) under the tongue every 5 (five) minutes as needed for chest pain.   omeprazole 20 MG capsule Commonly known as: PRILOSEC Take 20 mg by  mouth daily.   pramipexole 0.25 MG tablet Commonly known as: MIRAPEX Take 0.25 mg by mouth in the morning and at bedtime.   rosuvastatin 5 MG tablet Commonly known as: CRESTOR Take 5 mg by mouth at bedtime.   traZODone 50 MG tablet Commonly known as: DESYREL Take 50 mg by mouth at bedtime.        Follow-up Information     Yolonda Kida, MD. Go in 1 week(s).   Specialties: Cardiology, Internal Medicine Why: Appointment on Tuesday, 09/23/2022 at 2:45pm. Contact information: [place] [address] [phone]         Dion Body, MD. Go in 5 day(s).   Specialty: Family Medicine Why: Appointment on Thursday, 09/18/2022 at 11:00am with Nani Gasser. Contact information: [place] [facility] [place] [address] [phone]  Discharge Exam: Filed Weights   09/12/22 1845 09/15/22 0900 09/16/22 0904  Weight: 58.2 kg 59.1 kg 57.7 kg   Physical Exam HENT:     Head: Normocephalic.     Mouth/Throat:     Pharynx: No oropharyngeal exudate.  Eyes:     General: Lids are normal.     Conjunctiva/sclera: Conjunctivae normal.  Cardiovascular:     Rate and Rhythm: Normal rate and regular rhythm.     Heart sounds: S1 normal and S2 normal. Murmur heard.     Systolic murmur is present with a grade of 4/6.  Pulmonary:     Breath sounds: Examination of the right-lower field reveals decreased breath sounds. Examination of the left-lower field reveals decreased breath sounds. Decreased breath sounds present. No wheezing, rhonchi or rales.  Abdominal:     Palpations: Abdomen is soft.     Tenderness: There is no abdominal tenderness.  Musculoskeletal:     Right ankle: No swelling.     Left ankle: No swelling.  Skin:    General: Skin is warm.     Findings: No rash.  Neurological:     Mental Status: She is alert and oriented to person, place, and time.      Condition at discharge: stable  The results  of significant diagnostics from this hospitalization (including imaging, microbiology, ancillary and laboratory) are listed below for reference.   Imaging Studies: ECHOCARDIOGRAM COMPLETE  Result Date: 09/13/2022    ECHOCARDIOGRAM REPORT   Patient Name:   ROSETTE BELLAVANCE Date of Exam: 09/13/2022 Medical Rec #:  [id number]          Height:       61.0 in Accession #:    [id number]         Weight:       128.2 lb Date of Birth:  August 18, 1927         BSA:          1.563 m Patient Age:    41 years           BP:           104/77 mmHg Patient Gender: F                  HR:           69 bpm. Exam Location:  [facility] Procedure: 2D Echo Indications:     NSTEMI I21.4  History:         Patient has prior history of Echocardiogram examinations, most                  recent 06/15/2022.  Sonographer:     Kathlen Brunswick RDCS Referring Phys:  [id number] Lequita Halt Diagnosing Phys: Neoma Laming  Sonographer Comments: Image acquisition challenging due to respiratory motion. This was a challenging study due to patient coughing the entire test. IMPRESSIONS  1. Left ventricular ejection fraction, by estimation, is 60 to 65%. The left ventricle has normal function. The left ventricle has no regional wall motion abnormalities. There is severe concentric left ventricular hypertrophy. Left ventricular diastolic  parameters are consistent with Grade II diastolic dysfunction (pseudonormalization).  2. Right ventricular systolic function is moderately reduced. The right ventricular size is moderately enlarged. Moderately increased right ventricular wall thickness. There is mildly elevated pulmonary artery systolic pressure.  3. Left atrial size was severely dilated.  4. Right atrial size was moderately dilated.  5. The mitral valve is normal in structure. Mild mitral valve regurgitation. No  evidence of mitral stenosis.  6. Tricuspid valve regurgitation is moderate.  7. The aortic valve is calcified. Aortic valve regurgitation is mild. Severe  aortic valve stenosis.  8. The inferior vena cava is normal in size with greater than 50% respiratory variability, suggesting right atrial pressure of 3 mmHg. FINDINGS  Left Ventricle: Left ventricular ejection fraction, by estimation, is 60 to 65%. The left ventricle has normal function. The left ventricle has no regional wall motion abnormalities. The left ventricular internal cavity size was normal in size. There is  severe concentric left ventricular hypertrophy. Left ventricular diastolic parameters are consistent with Grade II diastolic dysfunction (pseudonormalization). Right Ventricle: The right ventricular size is moderately enlarged. Moderately increased right ventricular wall thickness. Right ventricular systolic function is moderately reduced. There is mildly elevated pulmonary artery systolic pressure. Left Atrium: Left atrial size was severely dilated. Right Atrium: Right atrial size was moderately dilated. Pericardium: There is no evidence of pericardial effusion. Mitral Valve: The mitral valve is normal in structure. Mild to moderate mitral annular calcification. Mild mitral valve regurgitation. No evidence of mitral valve stenosis. Tricuspid Valve: The tricuspid valve is normal in structure. Tricuspid valve regurgitation is moderate . No evidence of tricuspid stenosis. Aortic Valve: The aortic valve is calcified. Aortic valve regurgitation is mild. Severe aortic stenosis is present. Aortic valve mean gradient measures 35.2 mmHg. Aortic valve peak gradient measures 65.6 mmHg. Aortic valve area, by VTI measures 0.74 cm. Pulmonic Valve: The pulmonic valve was normal in structure. Pulmonic valve regurgitation is mild. No evidence of pulmonic stenosis. Aorta: The aortic root is normal in size and structure. Venous: The inferior vena cava is normal in size with greater than 50% respiratory variability, suggesting right atrial pressure of 3 mmHg. IAS/Shunts: No atrial level shunt detected by color flow  Doppler.  LEFT VENTRICLE PLAX 2D LVIDd:         4.60 cm   Diastology LVIDs:         3.10 cm   LV e' medial:    4.13 cm/s LV PW:         1.30 cm   LV E/e' medial:  34.4 LV IVS:        1.30 cm   LV e' lateral:   8.16 cm/s LVOT diam:     1.80 cm   LV E/e' lateral: 17.4 LV SV:         73 LV SV Index:   47 LVOT Area:     2.54 cm  RIGHT VENTRICLE RV Basal diam:  3.50 cm RV S prime:     14.10 cm/s TAPSE (M-mode): 3.0 cm LEFT ATRIUM             Index        RIGHT ATRIUM           Index LA diam:        3.70 cm 2.37 cm/m   RA Area:     12.60 cm LA Vol (A2C):   62.4 ml 39.91 ml/m  RA Volume:   32.90 ml  21.04 ml/m LA Vol (A4C):   87.1 ml 55.71 ml/m LA Biplane Vol: 73.0 ml 46.69 ml/m  AORTIC VALVE                     PULMONIC VALVE AV Area (Vmax):    0.70 cm      PV Vmax:       1.25 m/s AV Area (Vmean):   0.64 cm  PV Peak grad:  6.2 mmHg AV Area (VTI):     0.74 cm AV Vmax:           405.00 cm/s AV Vmean:          276.400 cm/s AV VTI:            0.987 m AV Peak Grad:      65.6 mmHg AV Mean Grad:      35.2 mmHg LVOT Vmax:         111.00 cm/s LVOT Vmean:        70.000 cm/s LVOT VTI:          0.286 m LVOT/AV VTI ratio: 0.29  AORTA Ao Root diam: 2.90 cm Ao Asc diam:  2.80 cm MITRAL VALVE                TRICUSPID VALVE MV Area (PHT): 3.74 cm     TV Peak grad:   37.1 mmHg MV Decel Time: 203 msec     TV Vmax:        3.04 m/s MV E velocity: 142.00 cm/s MV A velocity: 104.00 cm/s  SHUNTS MV E/A ratio:  1.37         Systemic VTI:  0.29 m                             Systemic Diam: 1.80 cm Neoma Laming Electronically signed by Neoma Laming Signature Date/Time: 09/13/2022/11:31:22 AM    Final    DG Chest Port 1 View  Result Date: 09/12/2022 CLINICAL DATA:  Cough and hypoxia.  Shortness of breath. EXAM: PORTABLE CHEST 1 VIEW COMPARISON:  06/26/2022 FINDINGS: The cardio pericardial silhouette is enlarged. Diffuse interstitial opacity again noted suggesting edema. There is somewhat focal airspace disease in the right parahilar  lung with a nodular opacity in the left mid lung. Tiny bilateral pleural effusions. Bones are diffusely demineralized. Telemetry leads overlie the chest. IMPRESSION: 1. Persistent interstitial opacity suggesting edema. 2. Somewhat focal airspace disease in the right parahilar lung with a nodular opacity in the left mid lung. These findings may reflect areas of asymmetric edema or multifocal infection. Given the nodular character to the left-sided opacity, follow-up is recommended to ensure resolution. Reviewing chest CTA 06/13/2022, no left nodule or mass in this region on that study. Electronically Signed   By: Misty Stanley M.D.   On: 09/12/2022 07:21    Microbiology: Results for orders placed or performed during the hospital encounter of 09/12/22  Blood culture (routine single)     Status: None (Preliminary result)   Collection Time: 09/12/22  6:55 AM   Specimen: BLOOD RIGHT FOREARM  Result Value Ref Range Status   Specimen Description BLOOD RIGHT FOREARM  Final   Special Requests   Final    BOTTLES DRAWN AEROBIC AND ANAEROBIC Blood Culture adequate volume   Culture   Final    NO GROWTH 4 DAYS Performed at [facility], [address] [place], [place] [address]    Report Status PENDING  Incomplete  Resp panel by RT-PCR (RSV, Flu A&B, Covid) Anterior Nasal Swab     Status: None   Collection Time: 09/12/22  6:55 AM   Specimen: Anterior Nasal Swab  Result Value Ref Range Status   SARS Coronavirus 2 by RT PCR NEGATIVE NEGATIVE Final    Comment: (NOTE) SARS-CoV-2 target nucleic acids are NOT DETECTED.  The SARS-CoV-2 RNA is generally detectable in upper  respiratory specimens during the acute phase of infection. The lowest concentration of SARS-CoV-2 viral copies this assay can detect is 138 copies/mL. A negative result does not preclude SARS-Cov-2 infection and should not be used as the sole basis for treatment or other patient management decisions. A negative result may occur  with  improper specimen collection/handling, submission of specimen other than nasopharyngeal swab, presence of viral mutation(s) within the areas targeted by this assay, and inadequate number of viral copies(<138 copies/mL). A negative result must be combined with clinical observations, patient history, and epidemiological information. The expected result is Negative.  Fact Sheet for Patients:  EntrepreneurPulse.com.au  Fact Sheet for Healthcare Providers:  IncredibleEmployment.be  This test is no t yet approved or cleared by the [place] FDA and  has been authorized for detection and/or diagnosis of SARS-CoV-2 by FDA under an Emergency Use Authorization (EUA). This EUA will remain  in effect (meaning this test can be used) for the duration of the COVID-19 declaration under Section 564(b)(1) of the Act, 21 U.S.C.section 360bbb-3(b)(1), unless the authorization is terminated  or revoked sooner.       Influenza A by PCR NEGATIVE NEGATIVE Final   Influenza B by PCR NEGATIVE NEGATIVE Final    Comment: (NOTE) The Xpert Xpress SARS-CoV-2/FLU/RSV plus assay is intended as an aid in the diagnosis of influenza from Nasopharyngeal swab specimens and should not be used as a sole basis for treatment. Nasal washings and aspirates are unacceptable for Xpert Xpress SARS-CoV-2/FLU/RSV testing.  Fact Sheet for Patients: EntrepreneurPulse.com.au  Fact Sheet for Healthcare Providers: IncredibleEmployment.be  This test is not yet approved or cleared by the [place] FDA and has been authorized for detection and/or diagnosis of SARS-CoV-2 by FDA under an Emergency Use Authorization (EUA). This EUA will remain in effect (meaning this test can be used) for the duration of the COVID-19 declaration under Section 564(b)(1) of the Act, 21 U.S.C. section 360bbb-3(b)(1), unless the authorization is terminated  or revoked.     Resp Syncytial Virus by PCR NEGATIVE NEGATIVE Final    Comment: (NOTE) Fact Sheet for Patients: EntrepreneurPulse.com.au  Fact Sheet for Healthcare Providers: IncredibleEmployment.be  This test is not yet approved or cleared by the [place] FDA and has been authorized for detection and/or diagnosis of SARS-CoV-2 by FDA under an Emergency Use Authorization (EUA). This EUA will remain in effect (meaning this test can be used) for the duration of the COVID-19 declaration under Section 564(b)(1) of the Act, 21 U.S.C. section 360bbb-3(b)(1), unless the authorization is terminated or revoked.  Performed at [facility] [place] [facility], [address] [place], [place] [address]     Labs: CBC: Recent Labs  Lab 09/12/22 [phone] 09/13/22 0100 09/13/22 0223 09/13/22 0716 09/13/22 1657 09/14/22 0614 09/15/22 0528 09/16/22 0543  WBC 10.0 8.6  --   --  7.4 6.3 6.1 3.2*  NEUTROABS 8.4*  --   --   --   --   --   --   --   HGB 7.9* 6.4*   < > 6.7* 8.6* 8.3* 8.7* 9.2*  HCT 26.0* 20.6*  --   --  26.5* 25.7* 26.9* 28.3*  MCV 107.4* 106.2*  --   --  100.0 99.2 98.2 96.6  PLT 221 171  --   --  183 175 207 218   < > = values in this interval not displayed.   Basic Metabolic Panel: Recent Labs  Lab 09/12/22 0656 09/13/22 0100 09/14/22 7353 09/15/22 0528 09/16/22 0543  NA  134* 129* 133* 135 129*  K 3.8 3.8 3.7 3.7 4.1  CL 98 95* 98 96* 92*  CO2 24 25 27 28 26   GLUCOSE 188* 169* 173* 168* 398*  BUN 44* 43* 46* 46* 50*  CREATININE 2.27* 2.27* 2.03* 1.93* 1.88*  CALCIUM 9.0 8.5* 8.7* 9.2 9.2   Liver Function Tests: Recent Labs  Lab 09/12/22 0656  AST 20  ALT 16  ALKPHOS 50  BILITOT 0.7  PROT 7.0  ALBUMIN 3.7     Discharge time spent: greater than 30 minutes.  Signed: Loletha Grayer, MD [facility] 09/16/2022

## 2022-09-16 NOTE — TOC Transition Note (Signed)
Transition of Care [facility]) - CM/SW Discharge Note   Patient Details  Name: Lisa Mcneil MRN: [id number] Date of Birth: Jun 02, 1928  [facility] CM/SW Contact:  Candie Chroman, LCSW Phone Number: 09/16/2022, 10:18 AM   Clinical Narrative:   Patient has orders to discharge home today. Per MD, no need for home oxygen. No further concerns. CSW signing off.  Final next level of care: Home/Self Care Barriers to Discharge: Barriers Resolved   Patient Goals and CMS Choice      Discharge Placement                  Patient to be transferred to facility by: Son   Patient and family notified of of transfer: 09/16/22  Discharge Plan and Services Additional resources added to the After Visit Summary for       Post Acute Care Choice: NA                               Social Determinants of Health (SDOH) Interventions [place]: No Food Insecurity (09/12/2022)  Housing: Low Risk  (09/12/2022)  Transportation Needs: No Transportation Needs (09/12/2022)  Utilities: Not At Risk (09/12/2022)  Tobacco Use: Medium Risk (09/12/2022)     Readmission Risk Interventions    09/15/2022    1:07 PM  Readmission Risk Prevention Plan  Transportation Screening Complete  PCP or Specialist Appt within 3-5 Days Complete  Social Work Consult for [place] Planning/Counseling Complete  Palliative Care Screening Not Applicable  Medication Review Press photographer) Complete

## 2022-09-17 ENCOUNTER — Ambulatory Visit: Payer: Medicare HMO | Attending: Family | Admitting: Family

## 2022-09-17 ENCOUNTER — Encounter: Payer: Self-pay | Admitting: Family

## 2022-09-17 VITALS — BP 142/52 | HR 84 | Resp 16 | Wt 130.2 lb

## 2022-09-17 DIAGNOSIS — K219 Gastro-esophageal reflux disease without esophagitis: Secondary | ICD-10-CM | POA: Insufficient documentation

## 2022-09-17 DIAGNOSIS — I10 Essential (primary) hypertension: Secondary | ICD-10-CM | POA: Diagnosis not present

## 2022-09-17 DIAGNOSIS — I110 Hypertensive heart disease with heart failure: Secondary | ICD-10-CM | POA: Diagnosis not present

## 2022-09-17 DIAGNOSIS — E1122 Type 2 diabetes mellitus with diabetic chronic kidney disease: Secondary | ICD-10-CM | POA: Diagnosis not present

## 2022-09-17 DIAGNOSIS — N184 Chronic kidney disease, stage 4 (severe): Secondary | ICD-10-CM | POA: Diagnosis not present

## 2022-09-17 DIAGNOSIS — E119 Type 2 diabetes mellitus without complications: Secondary | ICD-10-CM | POA: Insufficient documentation

## 2022-09-17 DIAGNOSIS — I350 Nonrheumatic aortic (valve) stenosis: Secondary | ICD-10-CM

## 2022-09-17 DIAGNOSIS — R059 Cough, unspecified: Secondary | ICD-10-CM | POA: Insufficient documentation

## 2022-09-17 DIAGNOSIS — I5032 Chronic diastolic (congestive) heart failure: Secondary | ICD-10-CM | POA: Insufficient documentation

## 2022-09-17 DIAGNOSIS — D649 Anemia, unspecified: Secondary | ICD-10-CM | POA: Diagnosis not present

## 2022-09-17 LAB — CULTURE, BLOOD (SINGLE)
Culture: NO GROWTH
Special Requests: ADEQUATE

## 2022-09-17 NOTE — Progress Notes (Signed)
Patient ID: Lisa Mcneil, female    DOB: [birth year], 89 y.o.   MRN: [id number]  HPI  Ms Morash is a 89 y/o female with a history of DM, HTN, anemia, GERD and chronic heart failure.   Echo 09/13/22 showed an EF of 60-65% along with severe LVH, mildly elevated PA pressure, severe LAE, moderate RAE, mild MR, moderate TR and severe AS. Echo report from 11/27/20 reviewed and showed an EF of 60-65% along with mild LVH, trivial MR and moderate AS.   Admitted 09/12/22 due to cough and SOB due to a/c heart failure. Antibiotics and IV lasix given. Blood transfusion done. Admitted 06/26/22 due to chest pain and SOB due to heart failure exacerbation. Diuresed. Blood transfusion given.    She presents today with for a f/u visit although hasn't been seen since May 2022. She presents with a chief complaint of occasional cough. She has associated difficulty sleeping (since being admitted) along with this. Denies any dizziness, abdominal distention, palpitations, pedal edema, chest pain, SOB, fatigue or weight gain.   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   Past Medical History:  Diagnosis Date   CHF (congestive heart failure) (HCC)    Diabetes mellitus without complication (HCC)    GERD (gastroesophageal reflux disease)    Hypertension    Insomnia    Iron deficiency anemia    RLS (restless legs syndrome)    Past Surgical History:  Procedure Laterality Date   BLADDER REPAIR     CATARACT EXTRACTION W/PHACO Right 05/13/2016   Procedure: CATARACT EXTRACTION PHACO AND INTRAOCULAR LENS PLACEMENT ([place]);  Surgeon: Birder Robson, MD;  Location: [facility] ORS;  Service: Ophthalmology;  Laterality: Right;  [place] 1.09 AP% 21.0 CDE 14.60 Fluid Pack Lot # [id number]   CATARACT EXTRACTION W/PHACO Left 07/15/2016   Procedure: CATARACT EXTRACTION PHACO AND INTRAOCULAR LENS PLACEMENT ([facility]);  Surgeon: Birder Robson, MD;  Location: [facility]  ORS;  Service: Ophthalmology;  Laterality: Left;  Lot# [id number] H [place]: 01:17.9 AP%:27.8 CDE: 21.67    No family history on file. Social History   Tobacco Use   Smoking status: Former   Smokeless tobacco: Never  Substance Use Topics   Alcohol use: No   Allergies  Allergen Reactions   Atorvastatin Other (See Comments)   Codeine    Demerol [Meperidine]    Polysaccharide Iron Complex Other (See Comments)    Caused constipation   Naproxen Rash    Caused rash on mouth   Prior to Admission medications   Medication Sig Start Date End Date Taking? Authorizing Provider  allopurinol (ZYLOPRIM) 100 MG tablet Take 0.5 tablets (50 mg total) by mouth daily. 09/19/22  Yes Wieting, Richard, MD  aspirin EC 81 MG tablet Take 1 tablet (81 mg total) by mouth daily. Swallow whole. 06/15/22  Yes Lorella Nimrod, MD  carvedilol (COREG) 3.125 MG tablet Take 1 tablet (3.125 mg total) by mouth 2 (two) times daily with a meal. 09/16/22  Yes Wieting, Richard, MD  ferrous sulfate 325 (65 FE) MG tablet Take 325 mg by mouth daily.   Yes [provider]  furosemide (LASIX) 40 MG tablet Take 1 tablet (40 mg total) by mouth 2 (two) times daily. 09/16/22 10/16/22 Yes Wieting, Richard, MD  gabapentin (NEURONTIN) 100 MG capsule Take 100 mg by mouth at bedtime.   Yes [provider]  isosorbide mononitrate (IMDUR) 60 MG 24 hr tablet Take 1 tablet (60 mg total)  by mouth daily. 06/29/22  Yes Wieting, Richard, MD  Melatonin 10 MG TABS Take 20 mg by mouth at bedtime.   Yes [provider]  Multiple Vitamins-Minerals (MULTIVITAMIN WITH MINERALS) tablet Take 1 tablet by mouth daily.   Yes [provider]  nitroGLYCERIN (NITROSTAT) 0.4 MG SL tablet Place 1 tablet (0.4 mg total) under the tongue every 5 (five) minutes as needed for chest pain. 06/15/22  Yes Lorella Nimrod, MD  omeprazole (PRILOSEC) 20 MG capsule Take 20 mg by mouth daily.   Yes [provider]  pramipexole (MIRAPEX) 0.25 MG  tablet Take 0.25 mg by mouth in the morning and at bedtime. 06/16/22  Yes [provider]  rosuvastatin (CRESTOR) 5 MG tablet Take 5 mg by mouth at bedtime.   Yes [provider]  traZODone (DESYREL) 50 MG tablet Take 50 mg by mouth at bedtime.   Yes [provider]    Review of Systems  Constitutional:  Negative for appetite change and fatigue.  HENT:  Negative for congestion, postnasal drip and sore throat.   Eyes: Negative.   Respiratory:  Positive for cough (on occasion). Negative for shortness of breath.   Cardiovascular:  Negative for chest pain, palpitations and leg swelling.  Gastrointestinal:  Negative for abdominal distention and abdominal pain.  Endocrine: Negative.   Genitourinary: Negative.   Musculoskeletal:  Negative for back pain and neck pain.  Skin: Negative.   Allergic/Immunologic: Negative.   Neurological:  Negative for dizziness and light-headedness.  Hematological:  Negative for adenopathy. Does not bruise/bleed easily.  Psychiatric/Behavioral:  Positive for sleep disturbance (sleeping on 1 pillow). Negative for dysphoric mood. The patient is not nervous/anxious.    Vitals:   09/17/22 1330  BP: (!) 142/52  Pulse: 84  Resp: 16  SpO2: 95%  Weight: 130 lb 4 oz (59.1 kg)   Wt Readings from Last 3 Encounters:  09/17/22 130 lb 4 oz (59.1 kg)  09/16/22 127 lb 4.8 oz (57.7 kg)  06/26/22 130 lb 4.7 oz (59.1 kg)   Lab Results  Component Value Date   CREATININE 1.88 (H) 09/16/2022   CREATININE 1.93 (H) 09/15/2022   CREATININE 2.03 (H) 09/14/2022   Physical Exam Vitals and nursing note reviewed.  Constitutional:      Appearance: Normal appearance.  HENT:     Head: Normocephalic and atraumatic.  Cardiovascular:     Rate and Rhythm: Normal rate and regular rhythm.  Pulmonary:     Effort: Pulmonary effort is normal. No respiratory distress.     Breath sounds: No wheezing or rales.  Abdominal:     General: There is no distension.      Palpations: Abdomen is soft.  Musculoskeletal:        General: No tenderness.     Cervical back: Normal range of motion and neck supple.     Right lower leg: No edema.     Left lower leg: No edema.  Skin:    General: Skin is warm and dry.  Neurological:     General: No focal deficit present.     Mental Status: She is alert and oriented to person, place, and time.  Psychiatric:        Mood and Affect: Mood normal.        Behavior: Behavior normal.        Thought Content: Thought content normal.    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   Return in 6 weeks, sooner if needed.

## 2022-09-17 NOTE — Patient Instructions (Addendum)
Begin weighing daily and call for an overnight weight gain of 3 pounds or more or a weekly weight gain of more than 5 pounds.   If you have voicemail, please make sure your mailbox is cleaned out so that we may leave a message and please make sure to listen to any voicemails.     

## 2022-09-18 DIAGNOSIS — I5032 Chronic diastolic (congestive) heart failure: Secondary | ICD-10-CM | POA: Diagnosis not present

## 2022-09-18 DIAGNOSIS — N184 Chronic kidney disease, stage 4 (severe): Secondary | ICD-10-CM | POA: Diagnosis not present

## 2022-09-18 DIAGNOSIS — E611 Iron deficiency: Secondary | ICD-10-CM | POA: Diagnosis not present

## 2022-09-18 DIAGNOSIS — Z09 Encounter for follow-up examination after completed treatment for conditions other than malignant neoplasm: Secondary | ICD-10-CM | POA: Diagnosis not present

## 2022-09-23 DIAGNOSIS — N183 Chronic kidney disease, stage 3 unspecified: Secondary | ICD-10-CM | POA: Diagnosis not present

## 2022-09-23 DIAGNOSIS — K219 Gastro-esophageal reflux disease without esophagitis: Secondary | ICD-10-CM | POA: Diagnosis not present

## 2022-09-23 DIAGNOSIS — E119 Type 2 diabetes mellitus without complications: Secondary | ICD-10-CM | POA: Diagnosis not present

## 2022-09-23 DIAGNOSIS — I350 Nonrheumatic aortic (valve) stenosis: Secondary | ICD-10-CM | POA: Diagnosis not present

## 2022-09-23 DIAGNOSIS — I5032 Chronic diastolic (congestive) heart failure: Secondary | ICD-10-CM | POA: Diagnosis not present

## 2022-09-23 DIAGNOSIS — E782 Mixed hyperlipidemia: Secondary | ICD-10-CM | POA: Diagnosis not present

## 2022-09-23 DIAGNOSIS — I214 Non-ST elevation (NSTEMI) myocardial infarction: Secondary | ICD-10-CM | POA: Diagnosis not present

## 2022-09-23 DIAGNOSIS — N1832 Chronic kidney disease, stage 3b: Secondary | ICD-10-CM | POA: Diagnosis not present

## 2022-09-23 DIAGNOSIS — I10 Essential (primary) hypertension: Secondary | ICD-10-CM | POA: Diagnosis not present

## 2022-09-24 DIAGNOSIS — E113393 Type 2 diabetes mellitus with moderate nonproliferative diabetic retinopathy without macular edema, bilateral: Secondary | ICD-10-CM | POA: Diagnosis not present

## 2022-10-13 ENCOUNTER — Other Ambulatory Visit: Payer: Medicare HMO

## 2022-10-13 ENCOUNTER — Other Ambulatory Visit: Payer: Self-pay | Admitting: *Deleted

## 2022-10-13 ENCOUNTER — Inpatient Hospital Stay: Payer: Medicare HMO | Attending: Oncology

## 2022-10-13 DIAGNOSIS — D508 Other iron deficiency anemias: Secondary | ICD-10-CM

## 2022-10-13 MED FILL — Iron Sucrose Inj 20 MG/ML (Fe Equiv): INTRAVENOUS | Qty: 10 | Status: AC

## 2022-10-14 ENCOUNTER — Encounter: Payer: Self-pay | Admitting: Oncology

## 2022-10-14 ENCOUNTER — Inpatient Hospital Stay: Payer: Medicare HMO | Admitting: Oncology

## 2022-10-14 ENCOUNTER — Inpatient Hospital Stay: Payer: Medicare HMO

## 2022-10-29 ENCOUNTER — Encounter: Payer: Self-pay | Admitting: Family

## 2022-10-29 ENCOUNTER — Ambulatory Visit: Payer: Medicare HMO | Attending: Family | Admitting: Family

## 2022-10-29 VITALS — BP 127/64 | HR 84 | Wt 135.1 lb

## 2022-10-29 DIAGNOSIS — I110 Hypertensive heart disease with heart failure: Secondary | ICD-10-CM | POA: Diagnosis not present

## 2022-10-29 DIAGNOSIS — I10 Essential (primary) hypertension: Secondary | ICD-10-CM

## 2022-10-29 DIAGNOSIS — N184 Chronic kidney disease, stage 4 (severe): Secondary | ICD-10-CM | POA: Diagnosis not present

## 2022-10-29 DIAGNOSIS — I5032 Chronic diastolic (congestive) heart failure: Secondary | ICD-10-CM

## 2022-10-29 DIAGNOSIS — I350 Nonrheumatic aortic (valve) stenosis: Secondary | ICD-10-CM

## 2022-10-29 DIAGNOSIS — E1122 Type 2 diabetes mellitus with diabetic chronic kidney disease: Secondary | ICD-10-CM | POA: Diagnosis not present

## 2022-10-29 DIAGNOSIS — K219 Gastro-esophageal reflux disease without esophagitis: Secondary | ICD-10-CM | POA: Insufficient documentation

## 2022-10-29 DIAGNOSIS — E119 Type 2 diabetes mellitus without complications: Secondary | ICD-10-CM | POA: Diagnosis not present

## 2022-10-29 NOTE — Progress Notes (Signed)
Patient ID: Lisa Mcneil, female    DOB: 26-Aug-1927, 89 y.o.   MRN: [id number]  HPI  Lisa Mcneil is a 89 y/o female with a history of DM, HTN, anemia, GERD and chronic heart failure.   Echo 09/13/22: EF of 60-65% along with severe LVH, mildly elevated PA pressure, severe LAE, moderate RAE, mild MR, moderate TR and severe AS. Echo report from 11/27/20 reviewed and showed an EF of 60-65% along with mild LVH, trivial MR and moderate AS.   Admitted 09/12/22 due to cough and SOB due to a/c heart failure. Antibiotics and IV lasix given. Blood transfusion done. Admitted 06/26/22 due to chest pain and SOB due to heart failure exacerbation. Diuresed. Blood transfusion given.    She presents today with a chief complaint of a HF f/u visit. Currently has no complaints and specifically denies difficulty sleeping, dizziness, abdominal distention, palpitations, pedal edema, chest pain, SOB, cough, fatigue or weight gain.   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   Past Medical History:  Diagnosis Date   CHF (congestive heart failure) (HCC)    Diabetes mellitus without complication (HCC)    GERD (gastroesophageal reflux disease)    Hypertension    Insomnia    Iron deficiency anemia    RLS (restless legs syndrome)    Past Surgical History:  Procedure Laterality Date   BLADDER REPAIR     CATARACT EXTRACTION W/PHACO Right 05/13/2016   Procedure: CATARACT EXTRACTION PHACO AND INTRAOCULAR LENS PLACEMENT ([place]);  Surgeon: Birder Robson, MD;  Location: [facility] ORS;  Service: Ophthalmology;  Laterality: Right;  [place] 1.09 AP% 21.0 CDE 14.60 Fluid Pack Lot # [id number]   CATARACT EXTRACTION W/PHACO Left 07/15/2016   Procedure: CATARACT EXTRACTION PHACO AND INTRAOCULAR LENS PLACEMENT ([facility]);  Surgeon: Birder Robson, MD;  Location: [facility] ORS;  Service: Ophthalmology;  Laterality: Left;  Lot# [id number] H [place]: 01:17.9 AP%:27.8 CDE: 21.67     No family history on file. Social History   Tobacco Use   Smoking status: Former   Smokeless tobacco: Never  Substance Use Topics   Alcohol use: No   Allergies  Allergen Reactions   Atorvastatin Other (See Comments)   Codeine    Demerol [Meperidine]    Polysaccharide Iron Complex Other (See Comments)    Caused constipation   Naproxen Rash    Caused rash on mouth   Prior to Admission medications   Medication Sig Start Date End Date Taking? Authorizing Provider  allopurinol (ZYLOPRIM) 100 MG tablet Take 0.5 tablets (50 mg total) by mouth daily. 09/19/22  Yes Lisa Mcneil, Richard, MD  aspirin EC 81 MG tablet Take 1 tablet (81 mg total) by mouth daily. Swallow whole. 06/15/22  Yes Lisa Nimrod, MD  carvedilol (COREG) 3.125 MG tablet Take 1 tablet (3.125 mg total) by mouth 2 (two) times daily with a meal. 09/16/22  Yes Lisa Mcneil, Richard, MD  ferrous sulfate 325 (65 FE) MG tablet Take 325 mg by mouth daily.   Yes [provider]  furosemide (LASIX) 40 MG tablet Take 1 tablet (40 mg total) by mouth 2 (two) times daily. 09/16/22 10/29/22 Yes Lisa Mcneil, Richard, MD  gabapentin (NEURONTIN) 100 MG capsule Take 100 mg by mouth at bedtime.   Yes [provider]  isosorbide mononitrate (IMDUR) 60 MG 24 hr tablet Take 1 tablet (60 mg total) by mouth daily. 06/29/22  Yes Lisa Grayer, MD  Melatonin 10 MG TABS Take 20 mg  by mouth at bedtime.   Yes [provider]  Multiple Vitamins-Minerals (MULTIVITAMIN WITH MINERALS) tablet Take 1 tablet by mouth daily.   Yes [provider]  nitroGLYCERIN (NITROSTAT) 0.4 MG SL tablet Place 1 tablet (0.4 mg total) under the tongue every 5 (five) minutes as needed for chest pain. 06/15/22  Yes Lisa Nimrod, MD  omeprazole (PRILOSEC) 20 MG capsule Take 20 mg by mouth daily.   Yes [provider]  pramipexole (MIRAPEX) 0.25 MG tablet Take 0.25 mg by mouth in the morning and at bedtime. 06/16/22  Yes [provider]   rosuvastatin (CRESTOR) 5 MG tablet Take 5 mg by mouth at bedtime.   Yes [provider]  traZODone (DESYREL) 50 MG tablet Take 50 mg by mouth at bedtime.   Yes [provider]     Review of Systems  Constitutional:  Negative for appetite change and fatigue.  HENT:  Negative for congestion, postnasal drip and sore throat.   Eyes: Negative.   Respiratory:  Negative for cough and shortness of breath.   Cardiovascular:  Negative for chest pain, palpitations and leg swelling.  Gastrointestinal:  Negative for abdominal distention and abdominal pain.  Endocrine: Negative.   Genitourinary: Negative.   Musculoskeletal:  Negative for back pain and neck pain.  Skin: Negative.   Allergic/Immunologic: Negative.   Neurological:  Negative for dizziness and light-headedness.  Hematological:  Negative for adenopathy. Does not bruise/bleed easily.  Psychiatric/Behavioral:  Negative for dysphoric mood and sleep disturbance (sleeping on 1 pillow). The patient is not nervous/anxious.    Vitals:   10/29/22 1324  BP: 127/64  Pulse: 84  SpO2: 97%  Weight: 135 lb 2 oz (61.3 kg)   Wt Readings from Last 3 Encounters:  10/29/22 135 lb 2 oz (61.3 kg)  09/17/22 130 lb 4 oz (59.1 kg)  09/16/22 127 lb 4.8 oz (57.7 kg)   Lab Results  Component Value Date   CREATININE 1.88 (H) 09/16/2022   CREATININE 1.93 (H) 09/15/2022   CREATININE 2.03 (H) 09/14/2022   Physical Exam Vitals and nursing note reviewed.  Constitutional:      Appearance: Normal appearance.  HENT:     Head: Normocephalic and atraumatic.  Cardiovascular:     Rate and Rhythm: Normal rate and regular rhythm.  Pulmonary:     Effort: Pulmonary effort is normal. No respiratory distress.     Breath sounds: No wheezing or rales.  Abdominal:     General: There is no distension.     Palpations: Abdomen is soft.  Musculoskeletal:        General: No tenderness.     Cervical back: Normal range of motion and neck supple.      Right lower leg: No edema.     Left lower leg: No edema.  Skin:    General: Skin is warm and dry.  Neurological:     General: No focal deficit present.     Mental Status: She is alert and oriented to person, place, and time.  Psychiatric:        Mood and Affect: Mood normal.        Behavior: Behavior normal.        Thought Content: Thought content normal.    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 Advised her that she could call back at anytime for questions/ issues and she was comfortable with this.

## 2022-10-29 NOTE — Patient Instructions (Signed)
Call us in the future if you need [place] for anything.    Keep up the great work!

## 2022-11-17 ENCOUNTER — Telehealth: Payer: Self-pay | Admitting: Oncology

## 2022-11-17 ENCOUNTER — Telehealth: Payer: Self-pay | Admitting: *Deleted

## 2022-11-17 ENCOUNTER — Other Ambulatory Visit: Payer: Self-pay | Admitting: *Deleted

## 2022-11-17 ENCOUNTER — Inpatient Hospital Stay: Payer: Medicare HMO | Attending: Oncology

## 2022-11-17 ENCOUNTER — Other Ambulatory Visit: Payer: Self-pay

## 2022-11-17 ENCOUNTER — Other Ambulatory Visit: Payer: Self-pay | Admitting: Oncology

## 2022-11-17 DIAGNOSIS — D509 Iron deficiency anemia, unspecified: Secondary | ICD-10-CM

## 2022-11-17 DIAGNOSIS — Z87891 Personal history of nicotine dependence: Secondary | ICD-10-CM | POA: Insufficient documentation

## 2022-11-17 DIAGNOSIS — E782 Mixed hyperlipidemia: Secondary | ICD-10-CM | POA: Diagnosis not present

## 2022-11-17 DIAGNOSIS — E611 Iron deficiency: Secondary | ICD-10-CM | POA: Diagnosis not present

## 2022-11-17 DIAGNOSIS — D508 Other iron deficiency anemias: Secondary | ICD-10-CM

## 2022-11-17 DIAGNOSIS — E1169 Type 2 diabetes mellitus with other specified complication: Secondary | ICD-10-CM | POA: Diagnosis not present

## 2022-11-17 DIAGNOSIS — E785 Hyperlipidemia, unspecified: Secondary | ICD-10-CM | POA: Diagnosis not present

## 2022-11-17 LAB — CBC WITH DIFFERENTIAL/PLATELET
Abs Immature Granulocytes: 0.01 10*3/uL (ref 0.00–0.07)
Basophils Absolute: 0.1 10*3/uL (ref 0.0–0.1)
Basophils Relative: 1 %
Eosinophils Absolute: 0.2 10*3/uL (ref 0.0–0.5)
Eosinophils Relative: 3 %
HCT: 21.7 % — ABNORMAL LOW (ref 36.0–46.0)
Hemoglobin: 6.6 g/dL — CL (ref 12.0–15.0)
Immature Granulocytes: 0 %
Lymphocytes Relative: 14 %
Lymphs Abs: 0.7 10*3/uL (ref 0.7–4.0)
MCH: 31.9 pg (ref 26.0–34.0)
MCHC: 30.4 g/dL (ref 30.0–36.0)
MCV: 104.8 fL — ABNORMAL HIGH (ref 80.0–100.0)
Monocytes Absolute: 0.3 10*3/uL (ref 0.1–1.0)
Monocytes Relative: 6 %
Neutro Abs: 4 10*3/uL (ref 1.7–7.7)
Neutrophils Relative %: 76 %
Platelets: 201 10*3/uL (ref 150–400)
RBC: 2.07 MIL/uL — ABNORMAL LOW (ref 3.87–5.11)
RDW: 13.6 % (ref 11.5–15.5)
WBC: 5.2 10*3/uL (ref 4.0–10.5)
nRBC: 0 % (ref 0.0–0.2)

## 2022-11-17 LAB — IRON AND TIBC
Iron: 23 ug/dL — ABNORMAL LOW (ref 28–170)
Saturation Ratios: 6 % — ABNORMAL LOW (ref 10.4–31.8)
TIBC: 413 ug/dL (ref 250–450)
UIBC: 390 ug/dL

## 2022-11-17 LAB — FERRITIN: Ferritin: 13 ng/mL (ref 11–307)

## 2022-11-17 LAB — PREPARE RBC (CROSSMATCH)

## 2022-11-17 LAB — SAMPLE TO BLOOD BANK

## 2022-11-17 MED FILL — Iron Sucrose Inj 20 MG/ML (Fe Equiv): INTRAVENOUS | Qty: 10 | Status: AC

## 2022-11-17 NOTE — Telephone Encounter (Addendum)
Attempts x 2 to reach patient to make her aware of change in appointments. Unable to leave VM, mailbox is full.

## 2022-11-17 NOTE — Telephone Encounter (Signed)
Error

## 2022-11-17 NOTE — Telephone Encounter (Signed)
Patient went for lab check at [facility] and her hgb has dropped to 6.7. She has lab today with [place] and see doctor with [place] tomorrow, Dr L asking if we can see her today and transfuse her so that they do not have to send her to the ER. Please advise

## 2022-11-18 ENCOUNTER — Inpatient Hospital Stay: Payer: Medicare HMO

## 2022-11-18 ENCOUNTER — Encounter: Payer: Self-pay | Admitting: Oncology

## 2022-11-18 ENCOUNTER — Other Ambulatory Visit: Payer: Self-pay

## 2022-11-18 ENCOUNTER — Inpatient Hospital Stay: Payer: Medicare HMO | Admitting: Oncology

## 2022-11-18 DIAGNOSIS — D509 Iron deficiency anemia, unspecified: Secondary | ICD-10-CM

## 2022-11-18 LAB — BPAM RBC
Blood Product Expiration Date: 202405032359
Unit Type and Rh: 6200

## 2022-11-18 LAB — TYPE AND SCREEN: Antibody Screen: NEGATIVE

## 2022-11-19 ENCOUNTER — Inpatient Hospital Stay (HOSPITAL_BASED_OUTPATIENT_CLINIC_OR_DEPARTMENT_OTHER): Payer: Medicare HMO | Admitting: Oncology

## 2022-11-19 ENCOUNTER — Inpatient Hospital Stay: Payer: Medicare HMO

## 2022-11-19 VITALS — BP 134/30 | HR 79 | Temp 96.4°F | Ht 60.5 in | Wt 138.0 lb

## 2022-11-19 DIAGNOSIS — D509 Iron deficiency anemia, unspecified: Secondary | ICD-10-CM | POA: Diagnosis not present

## 2022-11-19 DIAGNOSIS — Z87891 Personal history of nicotine dependence: Secondary | ICD-10-CM | POA: Diagnosis not present

## 2022-11-19 LAB — BPAM RBC
Blood Product Expiration Date: 202405042359
Unit Type and Rh: 6200

## 2022-11-19 LAB — PREPARE RBC (CROSSMATCH)

## 2022-11-19 LAB — TYPE AND SCREEN

## 2022-11-19 MED ORDER — DIPHENHYDRAMINE HCL 50 MG/ML IJ SOLN
25.0000 mg | Freq: Once | INTRAMUSCULAR | Status: AC
  Administered 2022-11-19: 25 mg via INTRAVENOUS
  Filled 2022-11-19: qty 1

## 2022-11-19 MED ORDER — SODIUM CHLORIDE 0.9% IV SOLUTION
250.0000 mL | Freq: Once | INTRAVENOUS | Status: AC
  Administered 2022-11-19: 250 mL via INTRAVENOUS
  Filled 2022-11-19: qty 250

## 2022-11-19 MED ORDER — ACETAMINOPHEN 325 MG PO TABS
650.0000 mg | ORAL_TABLET | Freq: Once | ORAL | Status: AC
  Administered 2022-11-19: 650 mg via ORAL
  Filled 2022-11-19: qty 2

## 2022-11-19 NOTE — Patient Instructions (Signed)
Blood Transfusion, Adult A blood transfusion is a procedure in which you receive blood through an IV tube. You may need this procedure because of: A bleeding disorder. An illness. An injury. A surgery. The blood may come from someone else (a donor). You may also be able to donate blood for yourself before a surgery. The blood given in a transfusion may be made up of different types of cells. You may get: Red blood cells. These carry oxygen to the cells in the body. Platelets. These help your blood to clot. Plasma. This is the liquid part of your blood. It carries proteins and other substances through the body. White blood cells. These help you fight infections. If you have a clotting disorder, you may also get other types of blood products. Depending on the type of blood product, this procedure may take 1-4 hours to complete. Tell your doctor about: Any bleeding problems you have. Any reactions you have had during a blood transfusion in the past. Any allergies you have. All medicines you are taking, including vitamins, herbs, eye drops, creams, and over-the-counter medicines. Any surgeries you have had. Any medical conditions you have. Whether you are pregnant or may be pregnant. What are the risks? Talk with your health care provider about risks. The most common problems include: A mild allergic reaction. This includes red, swollen areas of skin (hives) and itching. Fever or chills. This may be the body's response to new blood cells received. This may happen during or up to 4 hours after the transfusion. More serious problems may include: A serious allergic reaction. This includes breathing trouble or swelling around the face and lips. Too much fluid in the lungs. This may cause breathing problems. Lung injury. This causes breathing trouble and low oxygen in the blood. This can happen within hours of the transfusion or days later. Too much iron. This can happen after getting many blood  transfusions over a period of time. An infection or virus passed through the blood. This is rare. Donated blood is carefully tested before it is given. Your body's defense system (immune system) trying to attack the new blood cells. This is rare. Symptoms may include fever, chills, nausea, low blood pressure, and low back or chest pain. Donated cells attacking healthy tissues. This is rare. What happens before the procedure? You will have a blood test to find out your blood type. The test also finds out what type of blood your body will accept and matches it to the donor type. If you are going to have a planned surgery, you may be able to donate your own blood. This may be done in case you need a transfusion. You will have your temperature, blood pressure, and pulse checked. You may receive medicine to help prevent an allergic reaction. This may be done if you have had a reaction to a transfusion before. This medicine may be given to you by mouth or through an IV tube. What happens during the procedure?  An IV tube will be put into one of your veins. The bag of blood will be attached to your IV tube. Then, the blood will enter through your vein. Your temperature, blood pressure, and pulse will be checked often. This is done to find early signs of a transfusion reaction. Tell your nurse right away if you have any of these symptoms: Shortness of breath or trouble breathing. Chest or back pain. Fever or chills. Red, swollen areas of skin or itching. If you have any signs   or symptoms of a reaction, your transfusion will be stopped. You may also be given medicine. When the transfusion is finished, your IV tube will be taken out. Pressure may be put on the IV site for a few minutes. A bandage (dressing) will be put on the IV site. The procedure may vary among doctors and hospitals. What happens after the procedure? You will be monitored until you leave the hospital or clinic. This includes  checking your temperature, blood pressure, pulse, breathing rate, and blood oxygen level. Your blood may be tested to see how you have responded to the transfusion. You may be warmed with fluids or blankets. This is done to keep the temperature of your body normal. If you have your procedure in an outpatient setting, you will be told whom to contact to report any reactions. Where to find more information Visit the [employer]: redcross.org Summary A blood transfusion is a procedure in which you receive blood through an IV tube. The blood you are given may be made up of different blood cells. You may receive red blood cells, platelets, plasma, or white blood cells. Your temperature, blood pressure, and pulse will be checked often. After the procedure, your blood may be tested to see how you have responded. This information is not intended to replace advice given to you by your health care provider. Make sure you discuss any questions you have with your health care provider. Document Revised: 10/25/2021 Document Reviewed: 10/25/2021 Elsevier Patient Education  2023 [employer]  

## 2022-11-19 NOTE — Progress Notes (Signed)
[facility]  Telephone:(336) [phone] Fax:(336) [phone]  ID: Erick AlleyMarianne Stecklein OB: 1927/09/17  MR#: [id number]  QIO#:[id number]#:[id number]  Patient Care Team: Marisue IvanLinthavong, Kanhka, MD as PCP - General (Family Medicine) Jeralyn RuthsFinnegan, Donne Baley J, MD as Consulting Physician (Hematology and Oncology)  CHIEF COMPLAINT:  Iron deficiency anemia.  INTERVAL HISTORY: Patient returns to clinic today as an add-on after recent laboratory work revealed hemoglobin less than 7.0.  She has increased weakness and fatigue, but remains active and works full-time. She has no neurologic complaints. She denies any recent fevers or illnesses. She has a good appetite and denies weight loss.  She denies any chest pain, shortness of breath, cough, or hemoptysis.  She denies any nausea, vomiting, consultation, or diarrhea. She has no melena or hematochezia.  She has no urinary complaints.  Patient offers no further specific complaints today.  REVIEW OF SYSTEMS:   Review of Systems  Constitutional:  Positive for malaise/fatigue. Negative for fever and weight loss.  Respiratory: Negative.  Negative for cough and shortness of breath.   Cardiovascular: Negative.  Negative for chest pain and leg swelling.  Gastrointestinal: Negative.  Negative for abdominal pain, blood in stool and melena.  Genitourinary: Negative.  Negative for hematuria.  Musculoskeletal: Negative.  Negative for back pain.  Skin: Negative.  Negative for rash.  Neurological:  Positive for weakness. Negative for sensory change and focal weakness.  Psychiatric/Behavioral: Negative.  The patient is not nervous/anxious.     As per HPI. Otherwise, a complete review of systems is negative.  PAST MEDICAL HISTORY: Past Medical History:  Diagnosis Date   CHF (congestive heart failure) (HCC)    Diabetes mellitus without complication (HCC)    GERD (gastroesophageal reflux disease)    Hypertension    Insomnia    Iron deficiency anemia    RLS (restless  legs syndrome)     PAST SURGICAL HISTORY: Past Surgical History:  Procedure Laterality Date   BLADDER REPAIR     CATARACT EXTRACTION W/PHACO Right 05/13/2016   Procedure: CATARACT EXTRACTION PHACO AND INTRAOCULAR LENS PLACEMENT (IOC);  Surgeon: Galen ManilaWilliam Porfilio, MD;  Location: [facility] ORS;  Service: Ophthalmology;  Laterality: Right;  US 1.09 AP% 21.0 CDE 14.60 Fluid Pack Lot # [id number]   CATARACT EXTRACTION W/PHACO Left 07/15/2016   Procedure: CATARACT EXTRACTION PHACO AND INTRAOCULAR LENS PLACEMENT (IOC);  Surgeon: Galen ManilaWilliam Porfilio, MD;  Location: [facility] ORS;  Service: Ophthalmology;  Laterality: Left;  Lot# [id number] H US: 01:17.9 AP%:27.8 CDE: 21.67     FAMILY HISTORY: Reviewed and unchanged. No reported history of malignancy or chronic disease.     ADVANCED DIRECTIVES:    HEALTH MAINTENANCE: Social History   Tobacco Use   Smoking status: Former   Smokeless tobacco: Never  Building services engineerVaping Use   Vaping Use: Never used  Substance Use Topics   Alcohol use: No   Drug use: No     Colonoscopy:  PAP:  Bone density:  Lipid panel:  Allergies  Allergen Reactions   Atorvastatin Other (See Comments)   Codeine    Demerol [Meperidine]    Polysaccharide Iron Complex Other (See Comments)    Caused constipation   Naproxen Rash    Caused rash on mouth    Current Outpatient Medications  Medication Sig Dispense Refill   allopurinol (ZYLOPRIM) 100 MG tablet Take 0.5 tablets (50 mg total) by mouth daily. 15 tablet 0   aspirin EC 81 MG tablet Take 1 tablet (81 mg total) by mouth daily. Swallow whole. 30 tablet 12   carvedilol (COREG)  3.125 MG tablet Take 1 tablet (3.125 mg total) by mouth 2 (two) times daily with a meal. 60 tablet 0   ferrous sulfate 325 (65 FE) MG tablet Take 325 mg by mouth daily.     furosemide (LASIX) 40 MG tablet Take 1 tablet (40 mg total) by mouth 2 (two) times daily. 60 tablet 0   gabapentin (NEURONTIN) 100 MG capsule Take 100 mg by mouth at bedtime.     isosorbide  mononitrate (IMDUR) 60 MG 24 hr tablet Take 1 tablet (60 mg total) by mouth daily. 30 tablet 0   Melatonin 10 MG TABS Take 20 mg by mouth at bedtime.     Multiple Vitamins-Minerals (MULTIVITAMIN WITH MINERALS) tablet Take 1 tablet by mouth daily.     nitroGLYCERIN (NITROSTAT) 0.4 MG SL tablet Place 1 tablet (0.4 mg total) under the tongue every 5 (five) minutes as needed for chest pain. 20 tablet 12   omeprazole (PRILOSEC) 20 MG capsule Take 20 mg by mouth daily.     pramipexole (MIRAPEX) 0.25 MG tablet Take 0.25 mg by mouth in the morning and at bedtime.     rosuvastatin (CRESTOR) 5 MG tablet Take 5 mg by mouth at bedtime.     traZODone (DESYREL) 50 MG tablet Take 50 mg by mouth at bedtime.     No current facility-administered medications for this visit.   Facility-Administered Medications Ordered in Other Visits  Medication Dose Route Frequency Provider Last Rate Last Admin   0.9 %  sodium chloride infusion   Intravenous PRN Rushie Chestnut, PA-C 10 mL/hr at 07/02/22 1351 New Bag at 07/02/22 1351    OBJECTIVE: Vitals:   11/19/22 0840  BP: (!) 134/30  Pulse: 79  Temp: (!) 96.4 F (35.8 C)  SpO2: 97%     Body mass index is 26.51 kg/m.    ECOG FS:0 - Asymptomatic  General: Well-developed, well-nourished, no acute distress. Eyes: Pink conjunctiva, anicteric sclera. HEENT: Normocephalic, moist mucous membranes. Lungs: No audible wheezing or coughing. Heart: Regular rate and rhythm. Abdomen: Soft, nontender, no obvious distention. Musculoskeletal: No edema, cyanosis, or clubbing. Neuro: Alert, answering all questions appropriately. Cranial nerves grossly intact. Skin: No rashes or petechiae noted. Psych: Normal affect.   LAB RESULTS:  Lab Results  Component Value Date   NA 129 (L) 09/16/2022   K 4.1 09/16/2022   CL 92 (L) 09/16/2022   CO2 26 09/16/2022   GLUCOSE 398 (H) 09/16/2022   BUN 50 (H) 09/16/2022   CREATININE 1.88 (H) 09/16/2022   CALCIUM 9.2 09/16/2022    PROT 7.0 09/12/2022   ALBUMIN 3.7 09/12/2022   AST 20 09/12/2022   ALT 16 09/12/2022   ALKPHOS 50 09/12/2022   BILITOT 0.7 09/12/2022   GFRNONAA 25 (L) 09/16/2022   GFRAA >60 02/17/2015    Lab Results  Component Value Date   WBC 5.2 11/17/2022   NEUTROABS 4.0 11/17/2022   HGB 6.6 (LL) 11/17/2022   HCT 21.7 (L) 11/17/2022   MCV 104.8 (H) 11/17/2022   PLT 201 11/17/2022   Lab Results  Component Value Date   IRON 23 (L) 11/17/2022   TIBC 413 11/17/2022   IRONPCTSAT 6 (L) 11/17/2022   Lab Results  Component Value Date   FERRITIN 13 11/17/2022     STUDIES: No results found.  ASSESSMENT: Iron deficiency anemia.  PLAN:    1. Iron deficiency anemia: Patient's hemoglobin has significantly dropped to 6.6 and she is symptomatic.  Proceed with 2 units packed red blood  cells today.  Patient will return to clinic in 1 week for laboratory work only further follow-up will be based on these results.    I spent a total of 30 minutes reviewing chart data, face-to-face evaluation with the patient, counseling and coordination of care as detailed above.   Patient expressed understanding and was in agreement with this plan. She also understands that She can call clinic at any time with any questions, concerns, or complaints.    Jeralyn Ruths, MD   11/19/2022 1:51 PM

## 2022-11-20 LAB — TYPE AND SCREEN
ABO/RH(D): A POS
Unit division: 0
Unit division: 0

## 2022-11-20 LAB — BPAM RBC
Blood Product Expiration Date: 202405032359
ISSUE DATE / TIME: 202404100952
ISSUE DATE / TIME: 202404101134

## 2022-11-24 DIAGNOSIS — D509 Iron deficiency anemia, unspecified: Secondary | ICD-10-CM | POA: Diagnosis not present

## 2022-11-24 DIAGNOSIS — E1122 Type 2 diabetes mellitus with diabetic chronic kidney disease: Secondary | ICD-10-CM | POA: Diagnosis not present

## 2022-11-24 DIAGNOSIS — R011 Cardiac murmur, unspecified: Secondary | ICD-10-CM | POA: Diagnosis not present

## 2022-11-24 DIAGNOSIS — E782 Mixed hyperlipidemia: Secondary | ICD-10-CM | POA: Diagnosis not present

## 2022-11-24 DIAGNOSIS — E1169 Type 2 diabetes mellitus with other specified complication: Secondary | ICD-10-CM | POA: Diagnosis not present

## 2022-11-24 DIAGNOSIS — I130 Hypertensive heart and chronic kidney disease with heart failure and stage 1 through stage 4 chronic kidney disease, or unspecified chronic kidney disease: Secondary | ICD-10-CM | POA: Diagnosis not present

## 2022-11-24 DIAGNOSIS — N184 Chronic kidney disease, stage 4 (severe): Secondary | ICD-10-CM | POA: Diagnosis not present

## 2022-11-24 DIAGNOSIS — I5032 Chronic diastolic (congestive) heart failure: Secondary | ICD-10-CM | POA: Diagnosis not present

## 2022-11-26 ENCOUNTER — Inpatient Hospital Stay: Payer: Medicare HMO

## 2022-12-12 DIAGNOSIS — Z4689 Encounter for fitting and adjustment of other specified devices: Secondary | ICD-10-CM | POA: Diagnosis not present

## 2022-12-12 DIAGNOSIS — N814 Uterovaginal prolapse, unspecified: Secondary | ICD-10-CM | POA: Diagnosis not present

## 2022-12-15 DIAGNOSIS — E119 Type 2 diabetes mellitus without complications: Secondary | ICD-10-CM | POA: Diagnosis not present

## 2022-12-15 DIAGNOSIS — K219 Gastro-esophageal reflux disease without esophagitis: Secondary | ICD-10-CM | POA: Diagnosis not present

## 2022-12-15 DIAGNOSIS — I214 Non-ST elevation (NSTEMI) myocardial infarction: Secondary | ICD-10-CM | POA: Diagnosis not present

## 2022-12-15 DIAGNOSIS — I5032 Chronic diastolic (congestive) heart failure: Secondary | ICD-10-CM | POA: Diagnosis not present

## 2022-12-15 DIAGNOSIS — I10 Essential (primary) hypertension: Secondary | ICD-10-CM | POA: Diagnosis not present

## 2022-12-15 DIAGNOSIS — I350 Nonrheumatic aortic (valve) stenosis: Secondary | ICD-10-CM | POA: Diagnosis not present

## 2022-12-15 DIAGNOSIS — E782 Mixed hyperlipidemia: Secondary | ICD-10-CM | POA: Diagnosis not present

## 2022-12-15 DIAGNOSIS — N183 Chronic kidney disease, stage 3 unspecified: Secondary | ICD-10-CM | POA: Diagnosis not present

## 2023-03-11 DIAGNOSIS — E113293 Type 2 diabetes mellitus with mild nonproliferative diabetic retinopathy without macular edema, bilateral: Secondary | ICD-10-CM | POA: Diagnosis not present

## 2023-03-22 ENCOUNTER — Other Ambulatory Visit: Payer: Self-pay

## 2023-03-22 ENCOUNTER — Inpatient Hospital Stay
Admission: EM | Admit: 2023-03-22 | Discharge: 2023-03-27 | DRG: 377 | Disposition: A | Discharge status: Home / Self Care | Payer: Medicare HMO | Attending: Student in an Organized Health Care Education/Training Program | Admitting: Student in an Organized Health Care Education/Training Program

## 2023-03-22 ENCOUNTER — Emergency Department: Payer: Medicare HMO

## 2023-03-22 DIAGNOSIS — I5032 Chronic diastolic (congestive) heart failure: Secondary | ICD-10-CM | POA: Diagnosis not present

## 2023-03-22 DIAGNOSIS — Z79899 Other long term (current) drug therapy: Secondary | ICD-10-CM | POA: Diagnosis not present

## 2023-03-22 DIAGNOSIS — J9601 Acute respiratory failure with hypoxia: Secondary | ICD-10-CM | POA: Diagnosis present

## 2023-03-22 DIAGNOSIS — J189 Pneumonia, unspecified organism: Secondary | ICD-10-CM | POA: Diagnosis not present

## 2023-03-22 DIAGNOSIS — F5101 Primary insomnia: Secondary | ICD-10-CM | POA: Diagnosis present

## 2023-03-22 DIAGNOSIS — I2489 Other forms of acute ischemic heart disease: Secondary | ICD-10-CM | POA: Diagnosis present

## 2023-03-22 DIAGNOSIS — Z888 Allergy status to other drugs, medicaments and biological substances status: Secondary | ICD-10-CM

## 2023-03-22 DIAGNOSIS — E785 Hyperlipidemia, unspecified: Secondary | ICD-10-CM | POA: Diagnosis present

## 2023-03-22 DIAGNOSIS — I130 Hypertensive heart and chronic kidney disease with heart failure and stage 1 through stage 4 chronic kidney disease, or unspecified chronic kidney disease: Secondary | ICD-10-CM | POA: Diagnosis not present

## 2023-03-22 DIAGNOSIS — Z87891 Personal history of nicotine dependence: Secondary | ICD-10-CM

## 2023-03-22 DIAGNOSIS — K31811 Angiodysplasia of stomach and duodenum with bleeding: Principal | ICD-10-CM | POA: Diagnosis present

## 2023-03-22 DIAGNOSIS — Z9842 Cataract extraction status, left eye: Secondary | ICD-10-CM

## 2023-03-22 DIAGNOSIS — D500 Iron deficiency anemia secondary to blood loss (chronic): Secondary | ICD-10-CM | POA: Diagnosis present

## 2023-03-22 DIAGNOSIS — R918 Other nonspecific abnormal finding of lung field: Secondary | ICD-10-CM | POA: Diagnosis not present

## 2023-03-22 DIAGNOSIS — R0989 Other specified symptoms and signs involving the circulatory and respiratory systems: Secondary | ICD-10-CM | POA: Diagnosis not present

## 2023-03-22 DIAGNOSIS — D649 Anemia, unspecified: Secondary | ICD-10-CM | POA: Diagnosis not present

## 2023-03-22 DIAGNOSIS — M79606 Pain in leg, unspecified: Secondary | ICD-10-CM | POA: Diagnosis present

## 2023-03-22 DIAGNOSIS — J811 Chronic pulmonary edema: Secondary | ICD-10-CM | POA: Diagnosis not present

## 2023-03-22 DIAGNOSIS — D62 Acute posthemorrhagic anemia: Secondary | ICD-10-CM | POA: Diagnosis not present

## 2023-03-22 DIAGNOSIS — Z885 Allergy status to narcotic agent status: Secondary | ICD-10-CM | POA: Diagnosis not present

## 2023-03-22 DIAGNOSIS — Z961 Presence of intraocular lens: Secondary | ICD-10-CM | POA: Diagnosis present

## 2023-03-22 DIAGNOSIS — Z9841 Cataract extraction status, right eye: Secondary | ICD-10-CM

## 2023-03-22 DIAGNOSIS — I700 Atherosclerosis of aorta: Secondary | ICD-10-CM | POA: Diagnosis not present

## 2023-03-22 DIAGNOSIS — K5900 Constipation, unspecified: Secondary | ICD-10-CM | POA: Diagnosis not present

## 2023-03-22 DIAGNOSIS — I2510 Atherosclerotic heart disease of native coronary artery without angina pectoris: Secondary | ICD-10-CM | POA: Diagnosis not present

## 2023-03-22 DIAGNOSIS — Z66 Do not resuscitate: Secondary | ICD-10-CM | POA: Diagnosis not present

## 2023-03-22 DIAGNOSIS — K922 Gastrointestinal hemorrhage, unspecified: Secondary | ICD-10-CM

## 2023-03-22 DIAGNOSIS — R54 Age-related physical debility: Secondary | ICD-10-CM | POA: Diagnosis present

## 2023-03-22 DIAGNOSIS — I5033 Acute on chronic diastolic (congestive) heart failure: Secondary | ICD-10-CM | POA: Diagnosis not present

## 2023-03-22 DIAGNOSIS — K219 Gastro-esophageal reflux disease without esophagitis: Secondary | ICD-10-CM | POA: Diagnosis present

## 2023-03-22 DIAGNOSIS — Z794 Long term (current) use of insulin: Secondary | ICD-10-CM | POA: Diagnosis not present

## 2023-03-22 DIAGNOSIS — E1122 Type 2 diabetes mellitus with diabetic chronic kidney disease: Secondary | ICD-10-CM | POA: Diagnosis not present

## 2023-03-22 DIAGNOSIS — Z8249 Family history of ischemic heart disease and other diseases of the circulatory system: Secondary | ICD-10-CM | POA: Diagnosis not present

## 2023-03-22 DIAGNOSIS — G2581 Restless legs syndrome: Secondary | ICD-10-CM | POA: Diagnosis present

## 2023-03-22 DIAGNOSIS — Z7982 Long term (current) use of aspirin: Secondary | ICD-10-CM

## 2023-03-22 DIAGNOSIS — N184 Chronic kidney disease, stage 4 (severe): Secondary | ICD-10-CM | POA: Diagnosis present

## 2023-03-22 DIAGNOSIS — D631 Anemia in chronic kidney disease: Secondary | ICD-10-CM | POA: Diagnosis not present

## 2023-03-22 DIAGNOSIS — R0902 Hypoxemia: Secondary | ICD-10-CM | POA: Diagnosis not present

## 2023-03-22 DIAGNOSIS — R0602 Shortness of breath: Secondary | ICD-10-CM | POA: Diagnosis not present

## 2023-03-22 DIAGNOSIS — E782 Mixed hyperlipidemia: Secondary | ICD-10-CM | POA: Diagnosis not present

## 2023-03-22 DIAGNOSIS — I350 Nonrheumatic aortic (valve) stenosis: Secondary | ICD-10-CM | POA: Diagnosis present

## 2023-03-22 DIAGNOSIS — Z01818 Encounter for other preprocedural examination: Secondary | ICD-10-CM | POA: Diagnosis not present

## 2023-03-22 DIAGNOSIS — R791 Abnormal coagulation profile: Secondary | ICD-10-CM | POA: Diagnosis not present

## 2023-03-22 DIAGNOSIS — K921 Melena: Secondary | ICD-10-CM | POA: Diagnosis not present

## 2023-03-22 DIAGNOSIS — I252 Old myocardial infarction: Secondary | ICD-10-CM

## 2023-03-22 DIAGNOSIS — R7989 Other specified abnormal findings of blood chemistry: Secondary | ICD-10-CM | POA: Diagnosis present

## 2023-03-22 DIAGNOSIS — E119 Type 2 diabetes mellitus without complications: Secondary | ICD-10-CM

## 2023-03-22 LAB — BASIC METABOLIC PANEL
Anion gap: 10 (ref 5–15)
BUN: 62 mg/dL — ABNORMAL HIGH (ref 8–23)
CO2: 24 mmol/L (ref 22–32)
Calcium: 8.6 mg/dL — ABNORMAL LOW (ref 8.9–10.3)
Chloride: 100 mmol/L (ref 98–111)
Creatinine, Ser: 2.4 mg/dL — ABNORMAL HIGH (ref 0.44–1.00)
GFR, Estimated: 18 mL/min — ABNORMAL LOW (ref >60)
Glucose, Bld: 202 mg/dL — ABNORMAL HIGH (ref 70–99)
Potassium: 4.4 mmol/L (ref 3.5–5.1)
Sodium: 134 mmol/L — ABNORMAL LOW (ref 135–145)

## 2023-03-22 LAB — TYPE AND SCREEN
ABO/RH(D): A POS
Antibody Screen: NEGATIVE
Unit division: 0

## 2023-03-22 LAB — TROPONIN I (HIGH SENSITIVITY)
Troponin I (High Sensitivity): 20 ng/L — ABNORMAL HIGH (ref <18)
Troponin I (High Sensitivity): 24 ng/L — ABNORMAL HIGH (ref <18)

## 2023-03-22 LAB — BPAM RBC
Blood Product Expiration Date: 202408312359
ISSUE DATE / TIME: 202408111737
Unit Type and Rh: 6200

## 2023-03-22 LAB — CBC
HCT: 22.9 % — ABNORMAL LOW (ref 36.0–46.0)
Hemoglobin: 6.7 g/dL — ABNORMAL LOW (ref 12.0–15.0)
MCH: 29.5 pg (ref 26.0–34.0)
MCHC: 29.3 g/dL — ABNORMAL LOW (ref 30.0–36.0)
MCV: 100.9 fL — ABNORMAL HIGH (ref 80.0–100.0)
Platelets: 241 10*3/uL (ref 150–400)
RBC: 2.27 MIL/uL — ABNORMAL LOW (ref 3.87–5.11)
RDW: 13.2 % (ref 11.5–15.5)
WBC: 5.9 10*3/uL (ref 4.0–10.5)
nRBC: 0 % (ref 0.0–0.2)

## 2023-03-22 LAB — CBG MONITORING, ED: Glucose-Capillary: 146 mg/dL — ABNORMAL HIGH (ref 70–99)

## 2023-03-22 LAB — PREPARE RBC (CROSSMATCH)

## 2023-03-22 LAB — GLUCOSE, CAPILLARY: Glucose-Capillary: 184 mg/dL — ABNORMAL HIGH (ref 70–99)

## 2023-03-22 LAB — PROCALCITONIN: Procalcitonin: <0.1 ng/mL

## 2023-03-22 MED ORDER — INSULIN ASPART 100 UNIT/ML IJ SOLN
0.0000 [IU] | Freq: Three times a day (TID) | INTRAMUSCULAR | Status: DC
  Administered 2023-03-22 – 2023-03-23 (×2): 1 [IU] via SUBCUTANEOUS
  Administered 2023-03-23: 3 [IU] via SUBCUTANEOUS
  Administered 2023-03-24: 2 [IU] via SUBCUTANEOUS
  Administered 2023-03-25: 3 [IU] via SUBCUTANEOUS
  Administered 2023-03-25 (×2): 1 [IU] via SUBCUTANEOUS
  Administered 2023-03-26 (×2): 2 [IU] via SUBCUTANEOUS
  Filled 2023-03-22 (×9): qty 1

## 2023-03-22 MED ORDER — PANTOPRAZOLE SODIUM 40 MG IV SOLR
40.0000 mg | Freq: Once | INTRAVENOUS | Status: AC
  Administered 2023-03-22: 40 mg via INTRAVENOUS
  Filled 2023-03-22: qty 10

## 2023-03-22 MED ORDER — ROSUVASTATIN CALCIUM 5 MG PO TABS
5.0000 mg | ORAL_TABLET | Freq: Every day | ORAL | Status: DC
  Administered 2023-03-22 – 2023-03-26 (×5): 5 mg via ORAL
  Filled 2023-03-22 (×6): qty 1

## 2023-03-22 MED ORDER — ACETAMINOPHEN 325 MG PO TABS
650.0000 mg | ORAL_TABLET | Freq: Four times a day (QID) | ORAL | Status: AC | PRN
  Administered 2023-03-22: 650 mg via ORAL
  Filled 2023-03-22: qty 2

## 2023-03-22 MED ORDER — GUAIFENESIN 100 MG/5ML PO LIQD
5.0000 mL | Freq: Four times a day (QID) | ORAL | Status: AC | PRN
  Administered 2023-03-22 – 2023-03-23 (×3): 5 mL via ORAL
  Filled 2023-03-22 (×3): qty 10

## 2023-03-22 MED ORDER — SODIUM CHLORIDE 0.9 % IV SOLN
500.0000 mg | INTRAVENOUS | Status: AC
  Administered 2023-03-23 – 2023-03-26 (×4): 500 mg via INTRAVENOUS
  Filled 2023-03-22 (×4): qty 5

## 2023-03-22 MED ORDER — SODIUM CHLORIDE 0.9 % IV BOLUS
1000.0000 mL | Freq: Once | INTRAVENOUS | Status: AC
  Administered 2023-03-22: 1000 mL via INTRAVENOUS

## 2023-03-22 MED ORDER — HYDROCOD POLI-CHLORPHE POLI ER 10-8 MG/5ML PO SUER
5.0000 mL | Freq: Every evening | ORAL | Status: AC | PRN
  Administered 2023-03-22: 5 mL via ORAL
  Filled 2023-03-22: qty 5

## 2023-03-22 MED ORDER — SODIUM CHLORIDE 0.9 % IV SOLN
1.0000 g | INTRAVENOUS | Status: AC
  Administered 2023-03-23 – 2023-03-26 (×4): 1 g via INTRAVENOUS
  Filled 2023-03-22 (×4): qty 10

## 2023-03-22 MED ORDER — ACETAMINOPHEN 650 MG RE SUPP: 650.0000 mg | Freq: Four times a day (QID) | RECTAL | Status: AC | PRN

## 2023-03-22 MED ORDER — SODIUM CHLORIDE 0.9 % IV SOLN: 10.0000 mL/h | Freq: Once | INTRAVENOUS | Status: DC

## 2023-03-22 MED ORDER — SODIUM CHLORIDE 0.9 % IV SOLN
1.0000 g | Freq: Once | INTRAVENOUS | Status: AC
  Administered 2023-03-22: 1 g via INTRAVENOUS
  Filled 2023-03-22: qty 10

## 2023-03-22 MED ORDER — PANTOPRAZOLE 80MG IVPB - SIMPLE MED
80.0000 mg | Freq: Two times a day (BID) | INTRAVENOUS | Status: DC
  Administered 2023-03-22 – 2023-03-23 (×2): 80 mg via INTRAVENOUS
  Filled 2023-03-22 (×2): qty 100

## 2023-03-22 MED ORDER — GUAIFENESIN ER 600 MG PO TB12
600.0000 mg | ORAL_TABLET | Freq: Two times a day (BID) | ORAL | Status: DC
  Administered 2023-03-22 – 2023-03-27 (×10): 600 mg via ORAL
  Filled 2023-03-22 (×10): qty 1

## 2023-03-22 MED ORDER — ONDANSETRON HCL 4 MG/2ML IJ SOLN
4.0000 mg | Freq: Four times a day (QID) | INTRAMUSCULAR | Status: AC | PRN
  Administered 2023-03-25: 4 mg via INTRAVENOUS
  Filled 2023-03-22: qty 2

## 2023-03-22 MED ORDER — PRAMIPEXOLE DIHYDROCHLORIDE 0.25 MG PO TABS
0.2500 mg | ORAL_TABLET | Freq: Two times a day (BID) | ORAL | Status: DC
  Administered 2023-03-22 – 2023-03-27 (×10): 0.25 mg via ORAL
  Filled 2023-03-22 (×10): qty 1

## 2023-03-22 MED ORDER — TRAZODONE HCL 50 MG PO TABS
50.0000 mg | ORAL_TABLET | Freq: Every day | ORAL | Status: DC
  Administered 2023-03-22 – 2023-03-26 (×5): 50 mg via ORAL
  Filled 2023-03-22 (×5): qty 1

## 2023-03-22 MED ORDER — ROPINIROLE HCL 1 MG PO TABS
0.5000 mg | ORAL_TABLET | Freq: Every day | ORAL | Status: AC
  Administered 2023-03-22 – 2023-03-23 (×2): 0.5 mg via ORAL
  Filled 2023-03-22 (×2): qty 1

## 2023-03-22 MED ORDER — NEOMYCIN-POLYMYXIN-DEXAMETH 3.5-10000-0.1 OP SUSP
1.0000 [drp] | Freq: Three times a day (TID) | OPHTHALMIC | Status: DC
  Administered 2023-03-23 – 2023-03-27 (×12): 1 [drp] via OPHTHALMIC
  Filled 2023-03-22: qty 5

## 2023-03-22 MED ORDER — ALLOPURINOL 100 MG PO TABS
50.0000 mg | ORAL_TABLET | Freq: Every day | ORAL | Status: DC
  Administered 2023-03-23 – 2023-03-27 (×5): 50 mg via ORAL
  Filled 2023-03-22 (×5): qty 1

## 2023-03-22 MED ORDER — MELATONIN 5 MG PO TABS
5.0000 mg | ORAL_TABLET | Freq: Every evening | ORAL | Status: DC | PRN
  Administered 2023-03-24: 5 mg via ORAL
  Filled 2023-03-22: qty 1

## 2023-03-22 MED ORDER — INSULIN ASPART 100 UNIT/ML IJ SOLN
0.0000 [IU] | Freq: Every day | INTRAMUSCULAR | Status: DC
  Administered 2023-03-26: 2 [IU] via SUBCUTANEOUS
  Filled 2023-03-22: qty 1

## 2023-03-22 MED ORDER — SODIUM CHLORIDE 0.9 % IV SOLN
500.0000 mg | Freq: Once | INTRAVENOUS | Status: AC
  Administered 2023-03-22: 500 mg via INTRAVENOUS
  Filled 2023-03-22: qty 5

## 2023-03-22 MED ORDER — FERROUS SULFATE 325 (65 FE) MG PO TABS
325.0000 mg | ORAL_TABLET | Freq: Every day | ORAL | Status: DC
  Administered 2023-03-23: 325 mg via ORAL
  Filled 2023-03-22: qty 1

## 2023-03-22 MED ORDER — GABAPENTIN 100 MG PO CAPS
100.0000 mg | ORAL_CAPSULE | Freq: Every day | ORAL | Status: DC
  Administered 2023-03-22 – 2023-03-26 (×5): 100 mg via ORAL
  Filled 2023-03-22 (×5): qty 1

## 2023-03-22 MED ORDER — ONDANSETRON HCL 4 MG PO TABS: 4.0000 mg | ORAL_TABLET | Freq: Four times a day (QID) | ORAL | Status: AC | PRN

## 2023-03-22 NOTE — H&P (Addendum)
History and Physical   Lisa Mcneil UEA:[id number] DOB: 1928-01-12 DOA: 03/22/2023  PCP: Marisue Ivan, MD  Outpatient Specialists: Dr. Orlie Dakin, oncology Patient coming from: Home  I have personally briefly reviewed patient's old medical records in [facility] EMR.  Chief Concern: Shortness of breath, fatigue, weakness  HPI: Lisa Mcneil is a 89 year old female with history of iron deficiency anemia, CKD stage IV, aortic stenosis, history of multiple blood transfusion, anemia of chronic disease, who presents to the emergency department for chief concerns of shortness of breath and hypoxia.  Per ED documentation, patient had SpO2 of 80% on room air at home.  Vitals in the ED showed temperature of 98.7, respiration rate of 22, heart rate of 61, blood pressure 134/36, SpO2 of 90% on room air.  Serum sodium is 134, potassium 4.4, chloride 100, bicarb 24, BUN of 62, serum creatinine of 2.40, EGFR of 18, nonfasting blood glucose 202, WBC 5.9, hemoglobin 6.7, platelets of 241.  High sensitive troponin was mildly elevated at 20.  Chest x-ray 2 view: Mild airspace opacity at right base may represent pneumonia.  ED treatment: 1 unit of PRBC have been ordered, azithromycin 500 mg IV one-time dose, ceftriaxone IV one-time dose, sodium chloride 1 L bolus. ------------------------------ At bedside, patient was able to tell me her name, age, location, current calendar year.  She does not appear to be in acute distress.  She was on her cell phone when I entered the room.  She reports that over the last week she has been feeling short of breath and weak.  She also endorses a cough with productive green sputum for about 1 week.  She denies any fever, chills, chest pain, abdominal pain, dysuria, hematuria, diarrhea, blood in her stool.  She reports she has chronically black stool, since being on iron tablets supplementation.  Patient has leg pain that onsets at night.  She reports  that she takes over the counter "All Day Pain Relief " for her leg discomfort. She reports that has been ongoing for 'a while', about 1-2 years.  Social history: She denies tobacco, EtOH, recreational drug use.  ROS: Constitutional: no weight change, no fever ENT/Mouth: no sore throat, no rhinorrhea Eyes: no eye pain, no vision changes Cardiovascular: no chest pain, + dyspnea,  no edema, no palpitations Respiratory: + cough, + sputum, no wheezing Gastrointestinal: no nausea, no vomiting, no diarrhea, no constipation Genitourinary: no urinary incontinence, no dysuria, no hematuria Musculoskeletal: no arthralgias, no myalgias Skin: no skin lesions, no pruritus, Neuro: + weakness, no loss of consciousness, no syncope Psych: no anxiety, no depression, no decrease appetite Heme/Lymph: no bruising, no bleeding  ED Course: Discussed with EDP, patient requiring hospitalization for chief concerns of acute on chronic anemia, symptomatic anemia.  Assessment/Plan  Principal Problem:   Acute on chronic anemia Active Problems:   Acute hypoxic respiratory failure (HCC)   CAD (coronary artery disease)   Iron deficiency anemia due to chronic blood loss   CKD (chronic kidney disease) stage 4, GFR 15-29 ml/min (HCC)   HLD (hyperlipidemia)   GERD without esophagitis   Diabetes mellitus without complication (HCC)   Primary insomnia   Anemia in chronic kidney disease   Elevated troponin   Leg pain   Assessment and Plan:  * Acute on chronic anemia With symptomatic anemia Baseline hemoglobin level is 8.3-9.2 Suspect secondary to upper GI bleed, in setting of long term daily naproxen use Patient states she has never had endoscopy evaluation (upper or colonoscopy). She states  in the past, she did not want this procedure. Patient has 1 unit of type and screen and transfusion ordered by EDP Time to recheck of hemoglobin/hematocrit for 8/12 at midnight Protonix 80 mg IV twice daily ordered PIV:  nursing to ensure and maintain 2 peripheral IV (prefer large-bore) Goal hemoglobin greater than 8 Admit to PCU, inpatient  Acute hypoxic respiratory failure (HCC) I suspect it is multifactorial secondary to community acquired pneumonia (given patient endorsing cough with green sputum production) versus acute on chronic anemia in setting of possible GI bleed Check procalcitonin Azithromycin and ceftriaxone IV ordered Guaifenesin 5 mL PO q6h prn to loosen phlegm, 3 days; tussionex at bedtime prn for cough, 2 days ordered  CAD (coronary artery disease) Holding home aspirin on admission, a.m. team to resume when the benefits outweigh the risk Rosuvastatin 5 mg nightly resumed  Iron deficiency anemia due to chronic blood loss Patient takes iron tablets daily and also receives iron infusion at outpatient oncology clinic  HLD (hyperlipidemia) Rosuvastatin 5 mg nightly resumed  GERD without esophagitis PPI  Leg pain Per patient starts at night, I suspect secondary to restless leg Patient reports she takes "All Day Pain Relief "which is on naproxen Trial of Requip 0.5 mg nightly daily ordered Avoid NSAIDs given acute on chronic anemia, symptomatic anemia, suspect secondary to GI bleed Check serum magnesium level in a.m., replace as appropriate  Elevated troponin Suspect secondary to demand ischemia in setting of acute on chronic anemia EKG was negative for ischemic changes Treat per above  Anemia in chronic kidney disease Baseline hemoglobin level is 8.3-9.2  Primary insomnia Melatonin 5 mg at bedtime prn sleep  Diabetes mellitus without complication (HCC) Insulin SSI with at bedtime coverage ordered, renal dosing ordered  Chart reviewed.   I counseled patient on cessation of NSAID use as this can cause bleeding in the stomach and upper GI tract.  Patient endorses understanding and compliance.  DVT prophylaxis: TED hose Code Status: DNR Diet: Clear liquid; n.p.o. after  midnight Family Communication: Updated son, Negar Pashia who is the healthcare power of attorney Disposition Plan: Pending clinical course Consults called: Gastroenterology Admission status: PCU, inpatient  Past Medical History:  Diagnosis Date   CHF (congestive heart failure) (HCC)    Diabetes mellitus without complication (HCC)    GERD (gastroesophageal reflux disease)    Hypertension    Insomnia    Iron deficiency anemia    RLS (restless legs syndrome)    Past Surgical History:  Procedure Laterality Date   BLADDER REPAIR     CATARACT EXTRACTION W/PHACO Right 05/13/2016   Procedure: CATARACT EXTRACTION PHACO AND INTRAOCULAR LENS PLACEMENT (IOC);  Surgeon: Galen Manila, MD;  Location: [facility] ORS;  Service: Ophthalmology;  Laterality: Right;  [place] 1.09 AP% 21.0 CDE 14.60 Fluid Pack Lot # [id number]   CATARACT EXTRACTION W/PHACO Left 07/15/2016   Procedure: CATARACT EXTRACTION PHACO AND INTRAOCULAR LENS PLACEMENT (IOC);  Surgeon: Galen Manila, MD;  Location: [facility] ORS;  Service: Ophthalmology;  Laterality: Left;  Lot# [id number] H [place]: 01:17.9 AP%:27.8 CDE: 21.67    Social History:  reports that she has quit smoking. She has never used smokeless tobacco. She reports that she does not drink alcohol and does not use drugs.  Allergies  Allergen Reactions   Atorvastatin Other (See Comments)   Codeine    Demerol [Meperidine]    Polysaccharide Iron Complex Other (See Comments)    Caused constipation   Naproxen Rash    Caused rash on mouth  Family History  Problem Relation Age of Onset   Hypertension Father    Heart disease Father    Family history: Family history reviewed and not pertinent.  Prior to Admission medications   Medication Sig Start Date End Date Taking? Authorizing Provider  allopurinol (ZYLOPRIM) 100 MG tablet Take 0.5 tablets (50 mg total) by mouth daily. 09/19/22  Yes Wieting, Richard, MD  amLODipine (NORVASC) 10 MG tablet Take 10 mg by mouth daily. 11/14/22   Yes [provider]  aspirin EC 81 MG tablet Take 1 tablet (81 mg total) by mouth daily. Swallow whole. 06/15/22  Yes Arnetha Courser, MD  carvedilol (COREG) 3.125 MG tablet Take 1 tablet (3.125 mg total) by mouth 2 (two) times daily with a meal. 09/16/22  Yes Wieting, Richard, MD  ferrous sulfate 325 (65 FE) MG tablet Take 325 mg by mouth daily.   Yes [provider]  furosemide (LASIX) 40 MG tablet Take 1 tablet (40 mg total) by mouth 2 (two) times daily. 09/16/22 03/22/23 Yes Wieting, Richard, MD  gabapentin (NEURONTIN) 100 MG capsule Take 100 mg by mouth at bedtime.   Yes [provider]  isosorbide mononitrate (IMDUR) 60 MG 24 hr tablet Take 1 tablet (60 mg total) by mouth daily. 06/29/22  Yes Wieting, Richard, MD  Melatonin 10 MG TABS Take 20 mg by mouth at bedtime.   Yes [provider]  Multiple Vitamins-Minerals (MULTIVITAMIN WITH MINERALS) tablet Take 1 tablet by mouth daily.   Yes [provider]  neomycin-polymyxin b-dexamethasone (MAXITROL) 3.5-10000-0.1 SUSP Place 1 drop into the right eye 3 (three) times daily. 03/11/23  Yes [provider]  nitroGLYCERIN (NITROSTAT) 0.4 MG SL tablet Place 1 tablet (0.4 mg total) under the tongue every 5 (five) minutes as needed for chest pain. 06/15/22  Yes Arnetha Courser, MD  omeprazole (PRILOSEC) 20 MG capsule Take 20 mg by mouth daily.   Yes [provider]  pramipexole (MIRAPEX) 0.25 MG tablet Take 0.25 mg by mouth in the morning and at bedtime. 06/16/22  Yes [provider]  rosuvastatin (CRESTOR) 5 MG tablet Take 5 mg by mouth at bedtime.   Yes [provider]  traZODone (DESYREL) 50 MG tablet Take 50 mg by mouth at bedtime.   Yes [provider]    Physical Exam: Vitals:   03/22/23 1600 03/22/23 1700 03/22/23 1730 03/22/23 1802  BP: (!) 127/42 127/61 (!) 120/46 (!) 121/45  Pulse: (!) 58 73 83 75  Resp: (!) 26 19 18 18   Temp:   (!) 97.5 F (36.4 C) 97.7 F  (36.5 C)  TempSrc:   Axillary Oral  SpO2: 99% 100% 90% 98%  Weight:      Height:       Constitutional: appears frail, age-appropriate, NAD, calm Eyes: PERRL, lids and conjunctivae normal ENMT: Mucous membranes are moist. Posterior pharynx clear of any exudate or lesions. Age-appropriate dentition. Hearing appropriate Neck: normal, supple, no masses, no thyromegaly Respiratory: clear to auscultation bilaterally, no wheezing, no crackles. Normal respiratory effort. No accessory muscle use.  Cardiovascular: Regular rate and rhythm, no murmurs / rubs / gallops. No extremity edema. 2+ pedal pulses. No carotid bruits.  Abdomen: no tenderness, no masses palpated, no hepatosplenomegaly. Bowel sounds positive.  Musculoskeletal: no clubbing / cyanosis. No joint deformity upper and lower extremities. Good ROM, no contractures, no atrophy. Normal muscle tone.  Skin: no rashes, lesions, ulcers. No induration.  Pale skinned Neurologic: Sensation intact. Strength 5/5 in all 4.  Psychiatric: Normal  judgment and insight. Alert and oriented x 3. Normal mood.   EKG: independently reviewed, showing sinus rhythm with rate of 64, QTc 447  Chest x-ray on Admission: I personally reviewed and I agree with radiologist reading as below.  DG Chest 2 View  Result Date: 03/22/2023 CLINICAL DATA:  Shortness of breath.  Hypoxia. EXAM: CHEST - 2 VIEW COMPARISON:  One-view chest x-ray 09/12/2022 FINDINGS: Heart size is normal. Atherosclerotic calcifications are present at the aortic arch. Chronic interstitial coarsening is present. Mild airspace opacities are present at the right base. No other superimposed disease is present. Mild degenerative changes are noted at the shoulders bilaterally. IMPRESSION: 1. Mild airspace opacities at the right base may represent pneumonia. This could be atelectasis in this patient with chronic interstitial coarsening. No other focal airspace disease is present. 2. Aortic atherosclerosis.  Electronically Signed   By: Marin Roberts M.D.   On: 03/22/2023 14:33    Labs on Admission: I have personally reviewed following labs  CBC: Recent Labs  Lab 03/22/23 1413  WBC 5.9  HGB 6.7*  HCT 22.9*  MCV 100.9*  PLT 241   Basic Metabolic Panel: Recent Labs  Lab 03/22/23 1413  NA 134*  K 4.4  CL 100  CO2 24  GLUCOSE 202*  BUN 62*  CREATININE 2.40*  CALCIUM 8.6*   GFR: Estimated Creatinine Clearance: 12.4 mL/min (A) (by C-G formula based on SCr of 2.4 mg/dL (H)).  Urine analysis:    Component Value Date/Time   COLORURINE STRAW (A) 06/13/2022 1425   APPEARANCEUR CLEAR (A) 06/13/2022 1425   APPEARANCEUR Cloudy 06/23/2014 2320   LABSPEC 1.010 06/13/2022 1425   LABSPEC 1.015 06/23/2014 2320   PHURINE 5.0 06/13/2022 1425   GLUCOSEU NEGATIVE 06/13/2022 1425   GLUCOSEU Negative 06/23/2014 2320   HGBUR NEGATIVE 06/13/2022 1425   BILIRUBINUR NEGATIVE 06/13/2022 1425   BILIRUBINUR Negative 06/23/2014 2320   KETONESUR NEGATIVE 06/13/2022 1425   PROTEINUR NEGATIVE 06/13/2022 1425   NITRITE NEGATIVE 06/13/2022 1425   LEUKOCYTESUR SMALL (A) 06/13/2022 1425   LEUKOCYTESUR 3+ 06/23/2014 2320   This document was prepared using Dragon Voice Recognition software and may include unintentional dictation errors.  Dr. Sedalia Muta [facility]  If 7PM-7AM, please contact overnight-coverage provider If 7AM-7PM, please contact day attending provider [email or web address]  03/22/2023, 6:24 PM

## 2023-03-22 NOTE — ED Notes (Signed)
MD Cox at bedside at this time to get informed consent for blood transfusion at this time.

## 2023-03-22 NOTE — Assessment & Plan Note (Addendum)
With symptomatic anemia Baseline hemoglobin level is 8.3-9.2 Suspect secondary to upper GI bleed, in setting of long term daily naproxen use Patient states she has never had endoscopy evaluation (upper or colonoscopy). She states in the past, she did not want this procedure. Patient has 1 unit of type and screen and transfusion ordered by EDP Time to recheck of hemoglobin/hematocrit for 8/12 at midnight Protonix 80 mg IV twice daily ordered PIV: nursing to ensure and maintain 2 peripheral IV (prefer large-bore) Goal hemoglobin greater than 8 Admit to PCU, inpatient

## 2023-03-22 NOTE — ED Triage Notes (Signed)
Pt to ED for shob x1 week. Denies chest pain. Family reports SpO2 in the 80s with hx of fluid on lungs. Neg covid test. Productive cough noted.  90% on RA on arrival  Placed on 2 L 

## 2023-03-22 NOTE — ED Notes (Signed)
Admitting MD at bedside at this time.

## 2023-03-22 NOTE — Assessment & Plan Note (Addendum)
I suspect it is multifactorial secondary to community acquired pneumonia (given patient endorsing cough with green sputum production) versus acute on chronic anemia in setting of possible GI bleed Check procalcitonin Azithromycin and ceftriaxone IV ordered Guaifenesin 5 mL PO q6h prn to loosen phlegm, 3 days; tussionex at bedtime prn for cough, 2 days ordered

## 2023-03-22 NOTE — Assessment & Plan Note (Signed)
PPI ?

## 2023-03-22 NOTE — Hospital Course (Addendum)
[name] is a 89 year old female with history of iron deficiency anemia, CKD stage IV, aortic stenosis, history of multiple blood transfusion, anemia of chronic disease, who presents to the emergency department for chief concerns of shortness of breath and hypoxia.  Per ED documentation, patient had SpO2 of 80% on room air at home.  Vitals in the ED showed temperature of 98.7, respiration rate of 22, heart rate of 61, blood pressure 134/36, SpO2 of 90% on room air.  Serum sodium is 134, potassium 4.4, chloride 100, bicarb 24, BUN of 62, serum creatinine of 2.40, EGFR of 18, nonfasting blood glucose 202, WBC 5.9, hemoglobin 6.7, platelets of 241.  High sensitive troponin was mildly elevated at 20.  Chest x-ray 2 view: Mild airspace opacity at right base may represent pneumonia.  ED treatment: 1 unit of PRBC have been ordered, azithromycin 500 mg IV one-time dose, ceftriaxone IV one-time dose, sodium chloride 1 L bolus.

## 2023-03-22 NOTE — Assessment & Plan Note (Signed)
-   Rosuvastatin 5 mg nightly resumed ?

## 2023-03-22 NOTE — ED Notes (Signed)
Paper blood consent obtained at this time from patient.

## 2023-03-22 NOTE — Plan of Care (Signed)
  Problem: Coping: Goal: Ability to adjust to condition or change in health will improve Outcome: Progressing   Problem: Fluid Volume: Goal: Ability to maintain a balanced intake and output will improve Outcome: Progressing   Problem: Metabolic: Goal: Ability to maintain appropriate glucose levels will improve Outcome: Progressing   Problem: Education: Goal: Knowledge of General Education information will improve Description: Including pain rating scale, medication(s)/side effects and non-pharmacologic comfort measures Outcome: Progressing

## 2023-03-22 NOTE — Assessment & Plan Note (Signed)
Baseline hemoglobin level is 8.3-9.2 See acute on chronic anemia

## 2023-03-22 NOTE — Assessment & Plan Note (Addendum)
Per patient starts at night, I suspect secondary to restless leg Patient reports she takes "All Day Pain Relief "which is on naproxen Trial of Requip 0.5 mg nightly daily ordered Avoid NSAIDs given acute on chronic anemia, symptomatic anemia, suspect secondary to GI bleed Check serum magnesium level in a.m., replace as appropriate

## 2023-03-22 NOTE — Assessment & Plan Note (Signed)
Holding home aspirin on admission, a.m. team to resume when the benefits outweigh the risk Rosuvastatin 5 mg nightly resumed

## 2023-03-22 NOTE — Assessment & Plan Note (Signed)
Melatonin PRN

## 2023-03-22 NOTE — Assessment & Plan Note (Signed)
Patient takes iron tablets daily and also receives iron infusion at outpatient oncology clinic --Anemia panel with AM labs

## 2023-03-22 NOTE — ED Provider Notes (Signed)
[facility] Provider Note   Event Date/Time   First MD Initiated Contact with Patient 03/22/23 1343     (approximate) History  Shortness of Breath  HPI Lisa Mcneil is a 89 y.o. female with a stated past medical history of type 2 diabetes, hypertension, chronic kidney disease, heart failure, and iron deficiency anemia secondary to chronic GI blood loss who presents complaining of worsening shortness of breath and dyspnea on exertion over the past week.  Patient states that she has had similar symptoms in the past when her hemoglobin is too low.  Patient states that she has been told that she has GI bleeding in the past with a hemoglobin of 6.6 and was offered colonoscopy however denied and only needed 1 unit of packed red blood cell transfusion.  [name] is continued intermittent dark tarry stools. ROS: Patient currently denies any vision changes, tinnitus, difficulty speaking, facial droop, sore throat, chest pain, shortness of breath, abdominal pain, nausea/vomiting/diarrhea, dysuria, or weakness/numbness/paresthesias in any extremity   Physical Exam  Triage Vital Signs: ED Triage Vitals  Encounter Vitals Group     BP 03/22/23 1342 (!) 134/36     Systolic BP Percentile --      Diastolic BP Percentile --      Pulse Rate 03/22/23 1342 61     Resp 03/22/23 1342 (!) 26     Temp 03/22/23 1342 98.7 F (37.1 C)     Temp Source 03/22/23 1500 Oral     SpO2 03/22/23 1342 90 %     Weight 03/22/23 1339 132 lb (59.9 kg)     Height 03/22/23 1339 5\' 1"  (1.549 m)     Head Circumference --      Peak Flow --      Pain Score 03/22/23 1339 0     Pain Loc --      Pain Education --      Exclude from Growth Chart --    Most recent vital signs: Vitals:   03/22/23 1500 03/22/23 1530  BP: (!) 111/36 92/73  Pulse: (!) 58 72  Resp: 18 18  Temp: 98.7 F (37.1 C)   SpO2: 99% 100%   General: Awake, oriented x4. CV:  Good peripheral perfusion.  Resp:  Normal  effort.  2 L nasal cannula in place Abd:  No distention.  Other:  Elderly well-developed, well-nourished Caucasian female laying in bed in no acute distress ED Results / Procedures / Treatments  Labs (all labs ordered are listed, but only abnormal results are displayed) Labs Reviewed  BASIC METABOLIC PANEL - Abnormal; Notable for the following components:      Result Value   Sodium 134 (*)    Glucose, Bld 202 (*)    BUN 62 (*)    Creatinine, Ser 2.40 (*)    Calcium 8.6 (*)    GFR, Estimated 18 (*)    All other components within normal limits  CBC - Abnormal; Notable for the following components:   RBC 2.27 (*)    Hemoglobin 6.7 (*)    HCT 22.9 (*)    MCV 100.9 (*)    MCHC 29.3 (*)    All other components within normal limits  TROPONIN I (HIGH SENSITIVITY) - Abnormal; Notable for the following components:   Troponin I (High Sensitivity) 20 (*)    All other components within normal limits  CULTURE, BLOOD (ROUTINE X 2)  CULTURE, BLOOD (ROUTINE X 2)  PROCALCITONIN  PREPARE RBC (CROSSMATCH)  TYPE  AND SCREEN  TROPONIN I (HIGH SENSITIVITY)   EKG ED ECG REPORT I, Merwyn Katos, the attending physician, personally viewed and interpreted this ECG. Date: 03/22/2023 EKG Time: 1345 Rate: 64 Rhythm: normal sinus rhythm QRS Axis: normal Intervals: normal ST/T Wave abnormalities: normal Narrative Interpretation: no evidence of acute ischemia RADIOLOGY ED MD interpretation: 2 view chest x-ray interpreted independently by me and shows mild airspace opacities at the right lung base possibly representing pneumonia -Agree with radiology assessment Official radiology report(s): DG Chest 2 View  Result Date: 03/22/2023 CLINICAL DATA:  Shortness of breath.  Hypoxia. EXAM: CHEST - 2 VIEW COMPARISON:  One-view chest x-ray 09/12/2022 FINDINGS: Heart size is normal. Atherosclerotic calcifications are present at the aortic arch. Chronic interstitial coarsening is present. Mild airspace  opacities are present at the right base. No other superimposed disease is present. Mild degenerative changes are noted at the shoulders bilaterally. IMPRESSION: 1. Mild airspace opacities at the right base may represent pneumonia. This could be atelectasis in this patient with chronic interstitial coarsening. No other focal airspace disease is present. 2. Aortic atherosclerosis. Electronically Signed   By: Marin Roberts M.D.   On: 03/22/2023 14:33   PROCEDURES: Critical Care performed: Yes, see critical care procedure note(s) .1-3 Lead EKG Interpretation  Performed by: Merwyn Katos, MD Authorized by: Merwyn Katos, MD     Interpretation: normal     ECG rate:  71   ECG rate assessment: normal     Rhythm: sinus rhythm     Ectopy: none     Conduction: normal   CRITICAL CARE Performed by: Merwyn Katos  Total critical care time: 31 minutes  Critical care time was exclusive of separately billable procedures and treating other patients.  Critical care was necessary to treat or prevent imminent or life-threatening deterioration.  Critical care was time spent personally by me on the following activities: development of treatment plan with patient and/or surrogate as well as nursing, discussions with consultants, evaluation of patient's response to treatment, examination of patient, obtaining history from patient or surrogate, ordering and performing treatments and interventions, ordering and review of laboratory studies, ordering and review of radiographic studies, pulse oximetry and re-evaluation of patient's condition.  MEDICATIONS ORDERED IN ED: Medications  cefTRIAXone (ROCEPHIN) 1 g in sodium chloride 0.9 % 100 mL IVPB (has no administration in time range)  azithromycin (ZITHROMAX) 500 mg in sodium chloride 0.9 % 250 mL IVPB (has no administration in time range)  0.9 %  sodium chloride infusion (has no administration in time range)  sodium chloride 0.9 % bolus 1,000 mL (has no  administration in time range)  pantoprazole (PROTONIX) injection 40 mg (has no administration in time range)  acetaminophen (TYLENOL) tablet 650 mg (has no administration in time range)    Or  acetaminophen (TYLENOL) suppository 650 mg (has no administration in time range)  ondansetron (ZOFRAN) tablet 4 mg (has no administration in time range)    Or  ondansetron (ZOFRAN) injection 4 mg (has no administration in time range)  azithromycin (ZITHROMAX) 500 mg in sodium chloride 0.9 % 250 mL IVPB (has no administration in time range)  cefTRIAXone (ROCEPHIN) 1 g in sodium chloride 0.9 % 100 mL IVPB (has no administration in time range)  melatonin tablet 5 mg (has no administration in time range)  insulin aspart (novoLOG) injection 0-5 Units (has no administration in time range)  insulin aspart (novoLOG) injection 0-9 Units (has no administration in time range)   IMPRESSION /  MDM / ASSESSMENT AND PLAN / ED COURSE  I reviewed the triage vital signs and the nursing notes.                             The patient is on the cardiac monitor to evaluate for evidence of arrhythmia and/or significant heart rate changes Patient's presentation is most consistent with acute presentation with potential threat to life or bodily function. + black stool per rectum Given history and exam patients presentation most consistent with upper GI bleed possibly secondary to peptic ulcer disease or variceal bleeding. I have low suspicion for aortoenteric fistula, ENT bleeding mimic, Boerhaaves, Pulmonary bleeding mimic.  Workup: CBC, BMP, LFTs, Lipase, PT/INR, Type and Screen  Interventions: Analgesia and antiemetic medications PRN Protonix 40mg  IVP PRBC transfusion  Findings: Hb: 6.7  Disposition: Admit for close monitoring.   FINAL CLINICAL IMPRESSION(S) / ED DIAGNOSES   Final diagnoses:  Gastrointestinal hemorrhage, unspecified gastrointestinal hemorrhage type  Symptomatic anemia   Rx / DC Orders    ED Discharge Orders     None      Note:  This document was prepared using Dragon voice recognition software and may include unintentional dictation errors.   Merwyn Katos, MD 03/22/23 [phone]

## 2023-03-22 NOTE — Assessment & Plan Note (Signed)
Sliding scale Novolog

## 2023-03-22 NOTE — Assessment & Plan Note (Addendum)
Suspect secondary to demand ischemia in setting of acute on chronic anemia EKG was negative for ischemic changes Treat per above

## 2023-03-22 NOTE — ED Notes (Signed)
Pt to XRAY at this time.

## 2023-03-23 ENCOUNTER — Inpatient Hospital Stay: Payer: Medicare HMO

## 2023-03-23 DIAGNOSIS — D500 Iron deficiency anemia secondary to blood loss (chronic): Secondary | ICD-10-CM | POA: Diagnosis not present

## 2023-03-23 DIAGNOSIS — D649 Anemia, unspecified: Secondary | ICD-10-CM | POA: Diagnosis not present

## 2023-03-23 LAB — GLUCOSE, CAPILLARY
Glucose-Capillary: 114 mg/dL — ABNORMAL HIGH (ref 70–99)
Glucose-Capillary: 128 mg/dL — ABNORMAL HIGH (ref 70–99)
Glucose-Capillary: 222 mg/dL — ABNORMAL HIGH (ref 70–99)
Glucose-Capillary: 158 mg/dL — ABNORMAL HIGH (ref 70–99)

## 2023-03-23 LAB — PREPARE RBC (CROSSMATCH)

## 2023-03-23 LAB — HEMOGLOBIN AND HEMATOCRIT, BLOOD
HCT: 30.5 % — ABNORMAL LOW (ref 36.0–46.0)
Hemoglobin: 9.6 g/dL — ABNORMAL LOW (ref 12.0–15.0)

## 2023-03-23 MED ORDER — BOOST / RESOURCE BREEZE PO LIQD CUSTOM
1.0000 | Freq: Three times a day (TID) | ORAL | Status: DC
  Administered 2023-03-23 – 2023-03-27 (×8): 1 via ORAL

## 2023-03-23 MED ORDER — ACETAMINOPHEN 325 MG PO TABS
650.0000 mg | ORAL_TABLET | Freq: Once | ORAL | Status: DC
  Filled 2023-03-23: qty 2

## 2023-03-23 MED ORDER — FUROSEMIDE 10 MG/ML IJ SOLN
40.0000 mg | Freq: Once | INTRAMUSCULAR | Status: AC
  Administered 2023-03-23: 40 mg via INTRAVENOUS
  Filled 2023-03-23: qty 4

## 2023-03-23 MED ORDER — VITAMIN B-12 1000 MCG PO TABS
500.0000 ug | ORAL_TABLET | Freq: Every day | ORAL | Status: DC
  Administered 2023-03-23 – 2023-03-27 (×5): 500 ug via ORAL
  Filled 2023-03-23 (×5): qty 1

## 2023-03-23 MED ORDER — IPRATROPIUM-ALBUTEROL 0.5-2.5 (3) MG/3ML IN SOLN
3.0000 mL | RESPIRATORY_TRACT | Status: DC | PRN
  Administered 2023-03-23: 3 mL via RESPIRATORY_TRACT
  Filled 2023-03-23: qty 3

## 2023-03-23 MED ORDER — DIPHENHYDRAMINE HCL 25 MG PO CAPS
25.0000 mg | ORAL_CAPSULE | Freq: Once | ORAL | Status: DC
  Filled 2023-03-23: qty 1

## 2023-03-23 MED ORDER — PANTOPRAZOLE SODIUM 40 MG IV SOLR
40.0000 mg | Freq: Two times a day (BID) | INTRAVENOUS | Status: DC
  Administered 2023-03-23 – 2023-03-27 (×8): 40 mg via INTRAVENOUS
  Filled 2023-03-23 (×8): qty 10

## 2023-03-23 MED ORDER — MAGNESIUM OXIDE -MG SUPPLEMENT 400 (240 MG) MG PO TABS
400.0000 mg | ORAL_TABLET | Freq: Two times a day (BID) | ORAL | Status: DC | PRN
  Administered 2023-03-26: 400 mg via ORAL
  Filled 2023-03-23: qty 1

## 2023-03-23 MED ORDER — SODIUM CHLORIDE 0.9% IV SOLUTION: Freq: Once | INTRAVENOUS | Status: AC

## 2023-03-23 MED ORDER — SODIUM CHLORIDE 0.9 % IV SOLN
300.0000 mg | Freq: Once | INTRAVENOUS | Status: AC
  Administered 2023-03-23: 300 mg via INTRAVENOUS
  Filled 2023-03-23: qty 300

## 2023-03-23 NOTE — Consult Note (Signed)
CARDIOLOGY CONSULT NOTE               Patient ID: Lisa Mcneil MRN: [id number] DOB/AGE: 01/12/1928 89 y.o.  Admit date: 03/22/2023 Referring Physician Dr. Esaw Grandchild hospitalist Primary Physician Dr. Burnadette Pop primary Primary Cardiologist [facility] Reason for Consultation preop EGD aortic stenosis coronary artery disease  HPI: Patient is a 89 year old female history of deficiency anemia renal insufficiency stage IV severe aortic stenosis patient required multiple transfusions including iron anemia complained of generalized weakness fatigue patient reportedly was found to have sats in the 80s generalized weakness and fatigue her hemoglobin down to 6.7.  Patient denies any chest pain no Baclospas or syncope feeling somewhat improved preop for EGD  Review of systems complete and found to be negative unless listed above     Past Medical History:  Diagnosis Date   CHF (congestive heart failure) (HCC)    Diabetes mellitus without complication (HCC)    GERD (gastroesophageal reflux disease)    Hypertension    Insomnia    Iron deficiency anemia    RLS (restless legs syndrome)     Past Surgical History:  Procedure Laterality Date   BLADDER REPAIR     CATARACT EXTRACTION W/PHACO Right 05/13/2016   Procedure: CATARACT EXTRACTION PHACO AND INTRAOCULAR LENS PLACEMENT (IOC);  Surgeon: Galen Manila, MD;  Location: [facility] ORS;  Service: Ophthalmology;  Laterality: Right;  [place] 1.09 AP% 21.0 CDE 14.60 Fluid Pack Lot # [id number]   CATARACT EXTRACTION W/PHACO Left 07/15/2016   Procedure: CATARACT EXTRACTION PHACO AND INTRAOCULAR LENS PLACEMENT (IOC);  Surgeon: Galen Manila, MD;  Location: [facility] ORS;  Service: Ophthalmology;  Laterality: Left;  Lot# [id number] H [place]: 01:17.9 AP%:27.8 CDE: 21.67     Medications Prior to Admission  Medication Sig Dispense Refill Last Dose   allopurinol (ZYLOPRIM) 100 MG tablet Take 0.5 tablets (50 mg total) by mouth daily. 15 tablet 0 03/22/2023    amLODipine (NORVASC) 10 MG tablet Take 10 mg by mouth daily.   03/22/2023   aspirin EC 81 MG tablet Take 1 tablet (81 mg total) by mouth daily. Swallow whole. 30 tablet 12 03/22/2023   carvedilol (COREG) 3.125 MG tablet Take 1 tablet (3.125 mg total) by mouth 2 (two) times daily with a meal. 60 tablet 0 03/22/2023   ferrous sulfate 325 (65 FE) MG tablet Take 325 mg by mouth daily.   03/22/2023   furosemide (LASIX) 40 MG tablet Take 1 tablet (40 mg total) by mouth 2 (two) times daily. 60 tablet 0 Past Week   gabapentin (NEURONTIN) 100 MG capsule Take 100 mg by mouth at bedtime.   03/21/2023   isosorbide mononitrate (IMDUR) 60 MG 24 hr tablet Take 1 tablet (60 mg total) by mouth daily. 30 tablet 0 03/22/2023   Melatonin 10 MG TABS Take 20 mg by mouth at bedtime.   03/21/2023   Multiple Vitamins-Minerals (MULTIVITAMIN WITH MINERALS) tablet Take 1 tablet by mouth daily.   03/22/2023   neomycin-polymyxin b-dexamethasone (MAXITROL) 3.5-10000-0.1 SUSP Place 1 drop into the right eye 3 (three) times daily.   03/22/2023   nitroGLYCERIN (NITROSTAT) 0.4 MG SL tablet Place 1 tablet (0.4 mg total) under the tongue every 5 (five) minutes as needed for chest pain. 20 tablet 12 unk   omeprazole (PRILOSEC) 20 MG capsule Take 20 mg by mouth daily.   03/22/2023   pramipexole (MIRAPEX) 0.25 MG tablet Take 0.25 mg by mouth in the morning and at bedtime.   03/22/2023   rosuvastatin (CRESTOR) 5 MG tablet  Take 5 mg by mouth at bedtime.   03/21/2023   traZODone (DESYREL) 50 MG tablet Take 50 mg by mouth at bedtime.   03/21/2023   Social History   Socioeconomic History   Marital status: Single    Spouse name: Not on file   Number of children: Not on file   Years of education: Not on file   Highest education level: Not on file  Occupational History   Not on file  Tobacco Use   Smoking status: Former   Smokeless tobacco: Never  Vaping Use   Vaping status: Never Used  Substance and Sexual Activity   Alcohol use: No   Drug  use: No   Sexual activity: Not Currently  Other Topics Concern   Not on file  Social History Narrative   Not on file   Social Determinants of Health   Financial Resource Strain: Not on file  Food Insecurity: No Food Insecurity (09/12/2022)   Hunger Vital Sign    Worried About Running Out of Food in the Last Year: Never true    Ran Out of Food in the Last Year: Never true  Transportation Needs: No Transportation Needs (09/12/2022)   PRAPARE - Administrator, Civil Service (Medical): No    Lack of Transportation (Non-Medical): No  Physical Activity: Not on file  Stress: Not on file  Social Connections: Not on file  Intimate Partner Violence: Not At Risk (09/12/2022)   Humiliation, Afraid, Rape, and Kick questionnaire    Fear of Current or Ex-Partner: No    Emotionally Abused: No    Physically Abused: No    Sexually Abused: No    Family History  Problem Relation Age of Onset   Hypertension Father    Heart disease Father       Review of systems complete and found to be negative unless listed above      PHYSICAL EXAM  General: Well developed, well nourished, in no acute distress HEENT:  Normocephalic and atramatic Neck:  No JVD.  Lungs: Clear bilaterally to auscultation and percussion. Heart: HRRR . Normal S1 and S2 without gallops or 3/6 sem murmurs.  Abdomen: Bowel sounds are positive, abdomen soft and non-tender  Msk:  Back normal, normal gait. Normal strength and tone for age. Extremities: No clubbing, cyanosis or edema.   Neuro: Alert and oriented X 3. Psych:  Good affect, responds appropriately  Labs:   Lab Results  Component Value Date   WBC 4.1 03/23/2023   HGB 7.0 (L) 03/23/2023   HCT 22.3 (L) 03/23/2023   MCV 97.0 03/23/2023   PLT 178 03/23/2023    Recent Labs  Lab 03/23/23 0456  NA 139  K 4.5  CL 107  CO2 24  BUN 49*  CREATININE 2.27*  CALCIUM 8.3*  GLUCOSE 102*   No results found for: "CKTOTAL", "CKMB", "CKMBINDEX", "TROPONINI"   Lab Results  Component Value Date   CHOL 112 06/27/2022   CHOL 101 06/14/2022   Lab Results  Component Value Date   HDL 38 (L) 06/27/2022   HDL 37 (L) 06/14/2022   Lab Results  Component Value Date   LDLCALC 58 06/27/2022   LDLCALC 47 06/14/2022   Lab Results  Component Value Date   TRIG 80 06/27/2022   TRIG 86 06/14/2022   Lab Results  Component Value Date   CHOLHDL 2.9 06/27/2022   CHOLHDL 2.7 06/14/2022   No results found for: "LDLDIRECT"    Radiology: [facility] Chest 2 View  Result Date: 03/22/2023 CLINICAL DATA:  Shortness of breath.  Hypoxia. EXAM: CHEST - 2 VIEW COMPARISON:  One-view chest x-ray 09/12/2022 FINDINGS: Heart size is normal. Atherosclerotic calcifications are present at the aortic arch. Chronic interstitial coarsening is present. Mild airspace opacities are present at the right base. No other superimposed disease is present. Mild degenerative changes are noted at the shoulders bilaterally. IMPRESSION: 1. Mild airspace opacities at the right base may represent pneumonia. This could be atelectasis in this patient with chronic interstitial coarsening. No other focal airspace disease is present. 2. Aortic atherosclerosis. Electronically Signed   By: Marin Roberts M.D.   On: 03/22/2023 14:33    EKG: Normal sinus rhythm rate of 64 nonspecific ST-T wave changes  ASSESSMENT AND PLAN:  Upper GI bleed Anemia Aortic stenosis Coronary artery disease Chronic renal insufficiency stage IV Hyperlipidemia GERD Borderline troponin . Plan Agree with admit to telemetry follow-up troponins EKGs Continue evaluation for hemoglobin anemia recommend transfusion as necessary 8 Patient appears to be acceptable risk for EGD procedure Severe aortic stenosis treated medically do not recommend any intervention Hyperlipidemia management for hyperlipidemia Adequate hydration for renal insufficiency Continue PPI therapy for hyperlipidemia   03/23/2023, 4:27 PM

## 2023-03-23 NOTE — Progress Notes (Addendum)
Progress Note   [name] JXB:[id number] DOB: 1927/12/17 DOA: 03/22/2023     1 DOS: the patient was seen and examined on 03/23/2023   Brief hospital course: HPI on admission per Dr. Sedalia Muta: "Lisa Mcneil is a 89 year old female with history of iron deficiency anemia, CKD stage IV, aortic stenosis, history of multiple blood transfusion, anemia of chronic disease, who presents to the emergency department for chief concerns of shortness of breath and hypoxia.  Per ED documentation, patient had SpO2 of 80% on room air at home...."  Labs in ED notable for -- Hbg 6.7, Cr 2.40, BUN 62, procal < 0.10, WBC normal 5.9k, hs-troponin 20>>24.  Chest x-ray showed mild airspace opacity at right base may represent pneumonia.  Pt was admitted to the hospital with GI consulted. She was ordered for blood transfusion/s, IV PPI, and started on IV antibiotics for pneumonia.   Assessment and Plan:  * Acute on chronic anemia Hx of Iron Deficiency Anemia Hbg on admission 6.7. Baseline hemoglobin level is 8.3-9.2 Suspect upper GI bleed, in setting of long term daily naproxen use vs AVM's due to aortic stenosis.   --GI consulted - recs pending, follow up --Transfuse pRBC's to keep Hbg > 8 --NPO for now --IV PPI BID --Maintain 2 PIV's --Diet per GI  Acute hypoxic respiratory failure (HCC) Community-acquired pneumonia Likely due to community acquired pneumonia (pt reported cough with green sputum) +/- acute on chronic anemia in setting of possible GI bleed --Continue Rocephin, Zithromax --Scheduled Mucinex, Robitussin PRN, Tussionex PRN --IS and flutter for pulmonary hygiene --Collect sputum culture, if able --Follow blood cultures  CAD (coronary artery disease) Aspirin on hold due to potential GI bleed. Continue home statin.  Iron deficiency anemia due to chronic blood loss Patient takes iron tablets daily and also receives iron infusion at outpatient oncology clinic  CKD  (chronic kidney disease) stage 4, GFR 15-29 ml/min (HCC) Cr on admission 2.40 >> 2.27 today. Baseline Cr appears ~1.9-2.2 --Monitor BMP --Renally dose meds --Avoid nephrotoxins, hypotension, IV contrast  HLD (hyperlipidemia) Continue home statin  GERD without esophagitis On IV PPI currently  Leg pain Bilateral.  Seems due to RLS.   Patient takes naproxen at home. --Trial of Requip 0.5 mg nightly --Mg-oxide BID PRN may help w symptoms, pt agreeable to try --Avoid NSAIDs given acute on chronic anemia possibly with GI bleed  Elevated troponin Mild demand ischemia in setting of acute on chronic anemia.  EKG was negative for ischemic changes.  Anemia in chronic kidney disease Baseline hemoglobin level is 8.3-9.2 See acute on chronic anemia  Primary insomnia Melatonin PRN  Diabetes mellitus without complication (HCC) Sliding scale Novolog        Subjective: Pt seen awake resting in bed.  She reports being very hungry, asking when she can eat.   She reports both legs hurting, typically at night but sometimes during the day and currently very uncomfortable.  She is agreeable to try magnesium to see if it helps.  Endorses ongoing cough and dyspnea.  No other acute complaints.  Physical Exam: Vitals:   03/23/23 0603 03/23/23 0617 03/23/23 0859 03/23/23 1144  BP: (!) 147/47 (!) 147/47 (!) 174/59 (!) 137/51  Pulse:  (!) 52 67 71  Resp:  17 20 20   Temp: 97.8 F (36.6 C) 98.8 F (37.1 C) 97.7 F (36.5 C) 97.6 F (36.4 C)  TempSrc: Oral     SpO2: 94%  94% 93%  Weight:      Height:  General exam: awake, alert, no acute distress HEENT: dry mucus membranes, hearing grossly normal  Respiratory system: diminished breath sounds, no wheezes, mildly increased respiratory effort at rest with accessory muscle use. Cardiovascular system: normal S1/S2, RRR, no JVD, murmurs, rubs, gallops, no pedal edema.   Gastrointestinal system: soft, NT, ND, no HSM felt, +bowel  sounds. Central nervous system: A&O x 4. no gross focal neurologic deficits, normal speech Extremities: moves all, no edema, normal tone Skin: dry, intact, normal temperature Psychiatry: normal mood, congruent affect, judgement and insight appear normal   Data Reviewed:  Notable labs ---- Hbg 6.7 >> 7.2 >> 7.0, glucose 102, BUN 49, Cr 2.27 improved   Family Communication: Updated son, Gerlene Burdock, by phone this afternoon  Disposition: Status is: Inpatient Remains inpatient appropriate because: ongoing GI evaluation, requiring blood transfusions and IV therapies as above   Planned Discharge Destination: Home    Time spent: 46 minutes  Author: Pennie Banter, DO 03/23/2023 3:05 PM  For on call review [email or web address].

## 2023-03-23 NOTE — Assessment & Plan Note (Signed)
Cr on admission 2.40 >> 2.27 today. Baseline Cr appears ~1.9-2.2 --Monitor BMP --Renally dose meds --Avoid nephrotoxins, hypotension, IV contrast

## 2023-03-23 NOTE — TOC Initial Note (Signed)
Transition of Care [facility]) - Initial/Assessment Note    Patient Details  Name: Lisa Mcneil MRN: [id number] Date of Birth: 08-10-1928  Transition of Care [facility]) CM/SW Contact:    Truddie Hidden, RN Phone Number: 03/23/2023, 4:18 PM  Clinical Narrative:                  Admitted from:Home alone. States she currently works at [employer] PCP:Dr. Burnadette Pop Pharmacy:[facility] via mail order  Current home health/prior home health/DME:No home DME needed Transportation: Patient drives herself. Her son will transport her home Patient previously independent does not have a preference of HH if needed.      Barriers to Discharge: Continued Medical Work up   Patient Goals and CMS Choice Patient states their goals for this hospitalization and ongoing recovery are:: Home          Expected Discharge Plan and Services       Living arrangements for the past 2 months: Single Family Home                                      Prior Living Arrangements/Services Living arrangements for the past 2 months: Single Family Home Lives with:: Self Patient language and need for interpreter reviewed:: Yes        Need for Family Participation in Patient Care: Yes (Comment) Care giver support system in place?: Yes (comment)   Criminal Activity/Legal Involvement Pertinent to Current Situation/Hospitalization: No - Comment as needed  Activities of Daily Living      Permission Sought/Granted                  Emotional Assessment Appearance:: Appears older than stated age, Silvestre Gunner stated age Attitude/Demeanor/Rapport: Gracious, Engaged Affect (typically observed): Accepting Orientation: : Oriented to Self, Oriented to Place, Oriented to  Time, Oriented to Situation Alcohol / Substance Use: Not Applicable Psych Involvement: No (comment)  Admission diagnosis:  Symptomatic anemia [D64.9] Gastrointestinal hemorrhage, unspecified gastrointestinal hemorrhage type [K92.2] Acute  on chronic anemia [D64.9] Patient Active Problem List   Diagnosis Date Noted   Acute on chronic anemia 03/22/2023   Elevated troponin 03/22/2023   Leg pain 03/22/2023   Acute gout 09/15/2022   Acute hypoxic respiratory failure (HCC) 09/14/2022   Hyponatremia 09/14/2022   Aortic valve stenosis 09/14/2022   Acute on chronic diastolic (congestive) heart failure (HCC) 06/26/2022   Symptomatic anemia 06/26/2022   CAD (coronary artery disease) 06/26/2022   Myocardial injury 06/26/2022   Acute on chronic diastolic CHF (congestive heart failure) (HCC) 06/13/2022   NSTEMI (non-ST elevated myocardial infarction) (HCC) 06/13/2022   HLD (hyperlipidemia) 06/13/2022   Type II diabetes mellitus with renal manifestations (HCC) 06/13/2022   RLS (restless legs syndrome) 08/27/2021   Secondary hyperparathyroidism of renal origin (HCC) 07/25/2021   Anemia in chronic kidney disease 06/12/2021   Benign hypertensive kidney disease with chronic kidney disease 06/12/2021   Chronic diastolic CHF (congestive heart failure), NYHA class 1 (HCC) 12/05/2020   Systolic murmur 05/03/2019   Localized osteoporosis without current pathological fracture 04/26/2019   CKD (chronic kidney disease) stage 3, GFR 30-59 ml/min (HCC) 04/20/2018   CKD (chronic kidney disease) stage 4, GFR 15-29 ml/min (HCC) 04/20/2018   Diabetes mellitus without complication (HCC) 11/05/2017   Essential hypertension 11/05/2017   Iron deficiency 11/05/2017   Mixed hyperlipidemia 11/05/2017   Primary insomnia 05/07/2017   GERD without esophagitis 08/22/2015   Iron deficiency  anemia due to chronic blood loss 02/16/2015   PCP:  Marisue Ivan, MD Pharmacy:   [facility] [address], [place] - [address] [place] [address] Phone: [phone] Fax: [phone]  [facility] - [place], [place] - [address] [place] [address] Phone: [phone] Fax: [phone]     Social  Determinants of Health (SDOH) Social History: SDOH Screenings   Food Insecurity: No Food Insecurity (09/12/2022)  Housing: Low Risk  (09/12/2022)  Transportation Needs: No Transportation Needs (09/12/2022)  Utilities: Not At Risk (09/12/2022)  Tobacco Use: Medium Risk (03/22/2023)   SDOH Interventions:     Readmission Risk Interventions    03/23/2023    4:17 PM 09/15/2022    1:07 PM  Readmission Risk Prevention Plan  Transportation Screening Complete Complete  PCP or Specialist Appt within 3-5 Days Complete Complete  HRI or Home Care Consult Complete   Social Work Consult for Recovery Care Planning/Counseling Not Complete Complete  Palliative Care Screening Not Applicable Not Applicable  Medication Review Oceanographer) Complete Complete

## 2023-03-23 NOTE — Consult Note (Signed)
Arlyss Repress, MD [address]  [address]  [place], [place] [address]  Main: [phone]  Fax: [phone] Pager: [phone]   Consultation  Referring Provider:     No ref. provider found Primary Care Physician:  Marisue Ivan, MD Primary Gastroenterologist: Gentry Fitz         Reason for Consultation: Acute on chronic iron deficiency anemia, intermittent melena  Date of Admission:  03/22/2023 Date of Consultation:  03/23/2023         HPI:   Lisa Mcneil is a 89 y.o. female with history of moderate to severe aortic stenosis, CHF with preserved EF, diabetes, GERD, chronic iron deficiency anemia is admitted with 1 week history of shortness of breath and productive cough, found to have hypoxia.  Found to have severe anemia and intermittent dark tarry stools.  She was seen by Dr. Orlie Dakin in 11/2022 as outpatient, received 2 units of PRBCs.  She cannot tolerate parenteral iron therapy.  She presented with severe anemia in 09/2022, endoscopic evaluation was offered at that time which was deferred.  She was evaluated for iron deficiency anemia in 02/2015 as inpatient, patient chose to undergo endoscopic procedures as outpatient which were not performed.  Patient received blood transfusion only.  She now presents with hemoglobin of 6.7, MCV 100.9, normal platelets, BUN/creatinine 62/2.4.  She received 2 units of PRBCs and responded appropriately.  When I interviewed the patient, she was lying comfortably in bed, denied any abdominal pain, nausea or vomiting and she wanted to eat   NSAIDs: None  Antiplts/Anticoagulants/Anti thrombotics: Aspirin 81  GI Procedures: None  Past Medical History:  Diagnosis Date   CHF (congestive heart failure) (HCC)    Diabetes mellitus without complication (HCC)    GERD (gastroesophageal reflux disease)    Hypertension    Insomnia    Iron deficiency anemia    RLS (restless legs syndrome)     Past Surgical History:  Procedure  Laterality Date   BLADDER REPAIR     CATARACT EXTRACTION W/PHACO Right 05/13/2016   Procedure: CATARACT EXTRACTION PHACO AND INTRAOCULAR LENS PLACEMENT (IOC);  Surgeon: Galen Manila, MD;  Location: [facility] ORS;  Service: Ophthalmology;  Laterality: Right;  [place] 1.09 AP% 21.0 CDE 14.60 Fluid Pack Lot # [id number]   CATARACT EXTRACTION W/PHACO Left 07/15/2016   Procedure: CATARACT EXTRACTION PHACO AND INTRAOCULAR LENS PLACEMENT (IOC);  Surgeon: Galen Manila, MD;  Location: [facility] ORS;  Service: Ophthalmology;  Laterality: Left;  Lot# [id number] [place]: 01:17.9 AP%:27.8 CDE: 21.67      Current Facility-Administered Medications:    0.9 %  sodium chloride infusion, 10 mL/hr, Intravenous, Once, Cox, Amy N, DO, Last Rate: 10 mL/hr at 03/23/23 0949, 10 mL/hr at 03/23/23 0949   acetaminophen (TYLENOL) tablet 650 mg, 650 mg, Oral, Q6H PRN, 650 mg at 03/22/23 1806 **OR** acetaminophen (TYLENOL) suppository 650 mg, 650 mg, Rectal, Q6H PRN, Cox, Amy N, DO   acetaminophen (TYLENOL) tablet 650 mg, 650 mg, Oral, Once, Mansy, Jan A, MD   allopurinol (ZYLOPRIM) tablet 50 mg, 50 mg, Oral, Daily, Cox, Amy N, DO, 50 mg at 03/23/23 0941   azithromycin (ZITHROMAX) 500 mg in sodium chloride 0.9 % 250 mL IVPB, 500 mg, Intravenous, Q24H, Cox, Amy N, DO, Last Rate: 250 mL/hr at 03/23/23 1034, 500 mg at 03/23/23 1034   cefTRIAXone (ROCEPHIN) 1 g in sodium chloride 0.9 % 100 mL IVPB, 1 g, Intravenous, Q24H, Cox, Amy N, DO, Last Rate: 200 mL/hr at 03/23/23 0959, 1 g at  03/23/23 0959   chlorpheniramine-HYDROcodone (TUSSIONEX) 10-8 MG/5ML suspension 5 mL, 5 mL, Oral, QHS PRN, Cox, Amy N, DO, 5 mL at 03/22/23 2039   diphenhydrAMINE (BENADRYL) capsule 25 mg, 25 mg, Oral, Once, Mansy, Jan A, MD   ferrous sulfate tablet 325 mg, 325 mg, Oral, Daily, Cox, Amy N, DO, 325 mg at 03/23/23 0943   gabapentin (NEURONTIN) capsule 100 mg, 100 mg, Oral, QHS, Cox, Amy N, DO, 100 mg at 03/22/23 2039   guaiFENesin (MUCINEX) 12 hr tablet 600 mg, 600  mg, Oral, BID, Mansy, Jan A, MD, 600 mg at 03/23/23 0940   guaiFENesin (ROBITUSSIN) 100 MG/5ML liquid 5 mL, 5 mL, Oral, Q6H PRN, Cox, Amy N, DO, 5 mL at 03/23/23 0129   insulin aspart (novoLOG) injection 0-5 Units, 0-5 Units, Subcutaneous, QHS, Cox, Amy N, DO   insulin aspart (novoLOG) injection 0-9 Units, 0-9 Units, Subcutaneous, TID WC, Cox, Amy N, DO, 1 Units at 03/22/23 1640   melatonin tablet 5 mg, 5 mg, Oral, QHS PRN, Cox, Amy N, DO   neomycin-polymyxin b-dexamethasone (MAXITROL) ophthalmic suspension 1 drop, 1 drop, Right Eye, TID, Cox, Amy N, DO, 1 drop at 03/23/23 0946   ondansetron (ZOFRAN) tablet 4 mg, 4 mg, Oral, Q6H PRN **OR** ondansetron (ZOFRAN) injection 4 mg, 4 mg, Intravenous, Q6H PRN, Cox, Amy N, DO   pantoprazole (PROTONIX) 80 mg /NS 100 mL IVPB, 80 mg, Intravenous, Q12H, Cox, Amy N, DO, Last Rate: 300 mL/hr at 03/23/23 1003, 80 mg at 03/23/23 1003   pramipexole (MIRAPEX) tablet 0.25 mg, 0.25 mg, Oral, BID, Cox, Amy N, DO, 0.25 mg at 03/23/23 0945   rOPINIRole (REQUIP) tablet 0.5 mg, 0.5 mg, Oral, QHS, Cox, Amy N, DO, 0.5 mg at 03/22/23 1819   rosuvastatin (CRESTOR) tablet 5 mg, 5 mg, Oral, QHS, Cox, Amy N, DO, 5 mg at 03/22/23 2040   traZODone (DESYREL) tablet 50 mg, 50 mg, Oral, QHS, Cox, Amy N, DO, 50 mg at 03/22/23 2039  Facility-Administered Medications Ordered in Other Encounters:    0.9 %  sodium chloride infusion, , Intravenous, PRN, Covington, Sarah M, PA-C, Last Rate: 10 mL/hr at 07/02/22 1351, New Bag at 07/02/22 1351   Family History  Problem Relation Age of Onset   Hypertension Father    Heart disease Father      Social History   Tobacco Use   Smoking status: Former   Smokeless tobacco: Never  Vaping Use   Vaping status: Never Used  Substance Use Topics   Alcohol use: No   Drug use: No    Allergies as of 03/22/2023 - Review Complete 03/22/2023  Allergen Reaction Noted   Atorvastatin Other (See Comments) 06/15/2020   Codeine  02/16/2015   Demerol  [meperidine]  02/16/2015   Polysaccharide iron complex Other (See Comments) 02/16/2015   Naproxen Rash 02/16/2015    Review of Systems:    All systems reviewed and negative except where noted in HPI.   Physical Exam:  Vital signs in last 24 hours: Temp:  [97.5 F (36.4 C)-98.8 F (37.1 C)] 98.8 F (37.1 C) (08/12 0617) Pulse Rate:  [52-83] 52 (08/12 0617) Resp:  [17-26] 17 (08/12 0617) BP: (92-147)/(36-77) 147/47 (08/12 0617) SpO2:  [90 %-100 %] 94 % (08/12 0603) Weight:  [59.9 kg] 59.9 kg (08/11 1339) Last BM Date : 03/21/23 General:   Pleasant, cooperative in NAD Head:  Normocephalic and atraumatic. Eyes:   No icterus.   Conjunctiva pink. PERRLA. Ears:  Normal auditory acuity. Neck:  Supple; no masses or thyroidomegaly Lungs: Respirations even and unlabored. Lungs clear to auscultation bilaterally.   No wheezes, crackles, or rhonchi.  Heart:  Regular rate and rhythm;  Without murmur, clicks, rubs or gallops Abdomen:  Soft, nondistended, nontender. Normal bowel sounds. No appreciable masses or hepatomegaly.  No rebound or guarding.  Rectal:  Not performed. Msk:  Symmetrical without gross deformities.  Strength generalized weakness Extremities:  Without edema, cyanosis or clubbing. Neurologic:  Alert and oriented x3;  grossly normal neurologically. Skin:  Intact without significant lesions or rashes. Psych:  Alert and cooperative. Normal affect.  LAB RESULTS:    Latest Ref Rng & Units 03/23/2023    4:56 AM 03/22/2023   11:58 PM 03/22/2023    2:13 PM  CBC  WBC 4.0 - 10.5 K/uL 4.1   5.9   Hemoglobin 12.0 - 15.0 g/dL 7.0  7.2  6.7   Hematocrit 36.0 - 46.0 % 22.3  23.2  22.9   Platelets 150 - 400 K/uL 178   241     BMET    Latest Ref Rng & Units 03/23/2023    4:56 AM 03/22/2023    2:13 PM 09/16/2022    5:43 AM  BMP  Glucose 70 - 99 mg/dL 161  096  045   BUN 8 - 23 mg/dL 49  62  50   Creatinine 0.44 - 1.00 mg/dL 4.09  8.11  9.14   Sodium 135 - 145 mmol/L 139  134  129    Potassium 3.5 - 5.1 mmol/L 4.5  4.4  4.1   Chloride 98 - 111 mmol/L 107  100  92   CO2 22 - 32 mmol/L 24  24  26    Calcium 8.9 - 10.3 mg/dL 8.3  8.6  9.2     LFT    Latest Ref Rng & Units 09/12/2022    6:56 AM 06/13/2022    7:20 AM 02/16/2015    3:54 PM  Hepatic Function  Total Protein 6.5 - 8.1 g/dL 7.0  7.2  7.8   Albumin 3.5 - 5.0 g/dL 3.7  3.7  4.0   AST 15 - 41 U/L 20  21  27    ALT 0 - 44 U/L 16  14  18    Alk Phosphatase 38 - 126 U/L 50  60  56   Total Bilirubin 0.3 - 1.2 mg/dL 0.7  0.5  0.3      STUDIES: DG Chest 2 View  Result Date: 03/22/2023 CLINICAL DATA:  Shortness of breath.  Hypoxia. EXAM: CHEST - 2 VIEW COMPARISON:  One-view chest x-ray 09/12/2022 FINDINGS: Heart size is normal. Atherosclerotic calcifications are present at the aortic arch. Chronic interstitial coarsening is present. Mild airspace opacities are present at the right base. No other superimposed disease is present. Mild degenerative changes are noted at the shoulders bilaterally. IMPRESSION: 1. Mild airspace opacities at the right base may represent pneumonia. This could be atelectasis in this patient with chronic interstitial coarsening. No other focal airspace disease is present. 2. Aortic atherosclerosis. Electronically Signed   By: Marin Roberts M.D.   On: 03/22/2023 14:33      Impression / Plan:   Lisa Mcneil is a 89 y.o. 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 gastrointestinal bleeding from angiodysplasia in the presence of aortic stenosis Patient responded appropriately to blood transfusion Discussed in length with the patient's son, Lisa Mcneil as well as  patient who wish to proceed with endoscopic evaluation Recommend cardiology clearance before proceeding with endoscopic evaluation If upper endoscopy does not reveal a source of iron deficiency anemia, recommend colonoscopy with possible video capsule endoscopy Patient developed constipation with iron polysaccharide complex, not a true allergy Therefore, recommend parenteral iron therapy Hold oral iron replacement as inpatient while undergoing GI workup Okay to start full liquid diet today  Thank you for involving me in the care of this patient.      LOS: 1 day   Lannette Donath, MD  03/23/2023, 7:39 AM    Note: This dictation was prepared with Dragon dictation along with smaller phrase technology. Any transcriptional errors that result from this process are unintentional.

## 2023-03-23 NOTE — Progress Notes (Signed)
Pt complained of shortness of breath at around 2130H. Upon assessment, noted expiratory wheezes and O2 sat at 88% on oxygen at 2 lpm. Dr. Arville Care made aware via secured chat. PRN neb ordered and given. Increased oxygen to 4lpm nasal cannula.  2255H: Pt is requesting for medication to help her sleep. I told her I already gave her due Trazodone. When asked about her breathing, pt said "still lousy". No more wheezing but I noted crackles this time. Pt also have a non productive cough. Dr. Arville Care made aware. Chest xray was ordered. Lasix 40mg  IV ordered and given.

## 2023-03-24 ENCOUNTER — Inpatient Hospital Stay: Payer: Medicare HMO

## 2023-03-24 DIAGNOSIS — D500 Iron deficiency anemia secondary to blood loss (chronic): Secondary | ICD-10-CM | POA: Diagnosis not present

## 2023-03-24 DIAGNOSIS — D649 Anemia, unspecified: Secondary | ICD-10-CM | POA: Diagnosis not present

## 2023-03-24 LAB — VITAMIN B12: Vitamin B-12: 905 pg/mL (ref 180–914)

## 2023-03-24 LAB — D-DIMER, QUANTITATIVE: D-Dimer, Quant: 2.63 ug/mL-FEU — ABNORMAL HIGH (ref 0.00–0.50)

## 2023-03-24 LAB — GLUCOSE, CAPILLARY
Glucose-Capillary: 115 mg/dL — ABNORMAL HIGH (ref 70–99)
Glucose-Capillary: 108 mg/dL — ABNORMAL HIGH (ref 70–99)
Glucose-Capillary: 155 mg/dL — ABNORMAL HIGH (ref 70–99)
Glucose-Capillary: 174 mg/dL — ABNORMAL HIGH (ref 70–99)

## 2023-03-24 LAB — BRAIN NATRIURETIC PEPTIDE: B Natriuretic Peptide: 1820.8 pg/mL — ABNORMAL HIGH (ref 0.0–100.0)

## 2023-03-24 LAB — PROCALCITONIN: Procalcitonin: <0.1 ng/mL

## 2023-03-24 MED ORDER — TECHNETIUM TO 99M ALBUMIN AGGREGATED
3.7300 | Freq: Once | INTRAVENOUS | Status: AC | PRN
  Administered 2023-03-24: 3.73 via INTRAVENOUS

## 2023-03-24 MED ORDER — FUROSEMIDE 10 MG/ML IJ SOLN
40.0000 mg | Freq: Two times a day (BID) | INTRAMUSCULAR | Status: DC
  Administered 2023-03-24 – 2023-03-27 (×7): 40 mg via INTRAVENOUS
  Filled 2023-03-24 (×7): qty 4

## 2023-03-24 NOTE — Progress Notes (Signed)
Lisa Repress, MD [address]  [address]  [place], [place] [address]  Main: [phone]  Fax: [phone] Pager: [phone]   Subjective: Patient had an acute episode of hypoxia last night, oxygen saturation was 88% on 2 L.  Chest x-ray revealed interstitial infiltrates, BNP is elevated.  Received 40 mg of IV Lasix last night and today afternoon.  She is currently on 4 L oxygen She is requesting to assist with walking.  She has been getting up and using the bedside commode to urinate without any support.  She denies any abdominal pain, nausea or vomiting.   Objective: Vital signs in last 24 hours: Vitals:   03/23/23 2014 03/23/23 2145 03/24/23 0022 03/24/23 0409  BP: (!) 155/53  (!) 150/44 (!) 131/40  Pulse: 81 79 76 70  Resp: 18  20 20   Temp: 98 F (36.7 C)  98.6 F (37 C) 99.4 F (37.4 C)  TempSrc:      SpO2: 92% 92% 90% 90%  Weight:      Height:       Weight change:   Intake/Output Summary (Last 24 hours) at 03/24/2023 1344 Last data filed at 03/24/2023 0819 Gross per 24 hour  Intake --  Output 1300 ml  Net -1300 ml     Exam: Heart: Regular rate and rhythm or S1S2 present Lungs: bibasilar rales Abdomen: soft, nontender, normal bowel sounds   Lab Results:    Latest Ref Rng & Units 03/24/2023    4:25 AM 03/23/2023    4:04 PM 03/23/2023    4:56 AM  CBC  WBC 4.0 - 10.5 K/uL 8.6   4.1   Hemoglobin 12.0 - 15.0 g/dL 9.5  9.6  7.0   Hematocrit 36.0 - 46.0 % 29.7  30.5  22.3   Platelets 150 - 400 K/uL 201   178       Latest Ref Rng & Units 03/24/2023    4:25 AM 03/23/2023    4:56 AM 03/22/2023    2:13 PM  CMP  Glucose 70 - 99 mg/dL 962  952  841   BUN 8 - 23 mg/dL 42  49  62   Creatinine 0.44 - 1.00 mg/dL 3.24  4.01  0.27   Sodium 135 - 145 mmol/L 136  139  134   Potassium 3.5 - 5.1 mmol/L 4.2  4.5  4.4   Chloride 98 - 111 mmol/L 103  107  100   CO2 22 - 32 mmol/L 25  24  24    Calcium 8.9 - 10.3 mg/dL 8.8  8.3  8.6     Micro  Results: Recent Results (from the past 240 hour(s))  Blood culture (routine x 2)     Status: None (Preliminary result)   Collection Time: 03/22/23  3:49 PM   Specimen: BLOOD  Result Value Ref Range Status   Specimen Description BLOOD BLOOD RIGHT WRIST  Final   Special Requests   Final    BOTTLES DRAWN AEROBIC AND ANAEROBIC Blood Culture adequate volume   Culture   Final    NO GROWTH 2 DAYS Performed at [facility], [address] [place], [place] [address]    Report Status PENDING  Incomplete  Blood culture (routine x 2)     Status: None (Preliminary result)   Collection Time: 03/22/23  3:49 PM   Specimen: BLOOD  Result Value Ref Range Status   Specimen Description BLOOD LEFT ANTECUBITAL  Final   Special Requests  Final    BOTTLES DRAWN AEROBIC AND ANAEROBIC Blood Culture adequate volume   Culture   Final    NO GROWTH 2 DAYS Performed at [facility], [address], [place], [place] [address]    Report Status PENDING  Incomplete   Studies/Results: DG Chest Port 1 View  Result Date: 03/23/2023 CLINICAL DATA:  Hypoxia.  Shortness of breath. EXAM: PORTABLE CHEST 1 VIEW COMPARISON:  03/22/2023 FINDINGS: Cardiac enlargement. Since previous study there is interval development of pulmonary vascular congestion with perihilar and interstitial infiltrates, likely representing edema. Multifocal pneumonia would be less likely consideration. No pleural effusions. No pneumothorax. Mediastinal contours appear intact. Calcification of the aorta. Degenerative changes in the spine and shoulders. IMPRESSION: Cardiac enlargement with interval progression of diffuse perihilar and interstitial infiltrates. Electronically Signed   By: Burman Nieves M.D.   On: 03/23/2023 23:19   DG Chest 2 View  Result Date: 03/22/2023 CLINICAL DATA:  Shortness of breath.  Hypoxia. EXAM: CHEST - 2 VIEW COMPARISON:  One-view chest x-ray 09/12/2022 FINDINGS: Heart size is normal. Atherosclerotic  calcifications are present at the aortic arch. Chronic interstitial coarsening is present. Mild airspace opacities are present at the right base. No other superimposed disease is present. Mild degenerative changes are noted at the shoulders bilaterally. IMPRESSION: 1. Mild airspace opacities at the right base may represent pneumonia. This could be atelectasis in this patient with chronic interstitial coarsening. No other focal airspace disease is present. 2. Aortic atherosclerosis. Electronically Signed   By: Marin Roberts M.D.   On: 03/22/2023 14:33   Medications: I have reviewed the patient's current medications. Prior to Admission:  Medications Prior to Admission  Medication Sig Dispense Refill Last Dose   allopurinol (ZYLOPRIM) 100 MG tablet Take 0.5 tablets (50 mg total) by mouth daily. 15 tablet 0 03/22/2023   amLODipine (NORVASC) 10 MG tablet Take 10 mg by mouth daily.   03/22/2023   aspirin EC 81 MG tablet Take 1 tablet (81 mg total) by mouth daily. Swallow whole. 30 tablet 12 03/22/2023   carvedilol (COREG) 3.125 MG tablet Take 1 tablet (3.125 mg total) by mouth 2 (two) times daily with a meal. 60 tablet 0 03/22/2023   ferrous sulfate 325 (65 FE) MG tablet Take 325 mg by mouth daily.   03/22/2023   furosemide (LASIX) 40 MG tablet Take 1 tablet (40 mg total) by mouth 2 (two) times daily. 60 tablet 0 Past Week   gabapentin (NEURONTIN) 100 MG capsule Take 100 mg by mouth at bedtime.   03/21/2023   isosorbide mononitrate (IMDUR) 60 MG 24 hr tablet Take 1 tablet (60 mg total) by mouth daily. 30 tablet 0 03/22/2023   Melatonin 10 MG TABS Take 20 mg by mouth at bedtime.   03/21/2023   Multiple Vitamins-Minerals (MULTIVITAMIN WITH MINERALS) tablet Take 1 tablet by mouth daily.   03/22/2023   neomycin-polymyxin b-dexamethasone (MAXITROL) 3.5-10000-0.1 SUSP Place 1 drop into the right eye 3 (three) times daily.   03/22/2023   nitroGLYCERIN (NITROSTAT) 0.4 MG SL tablet Place 1 tablet (0.4 mg total) under  the tongue every 5 (five) minutes as needed for chest pain. 20 tablet 12 unk   omeprazole (PRILOSEC) 20 MG capsule Take 20 mg by mouth daily.   03/22/2023   pramipexole (MIRAPEX) 0.25 MG tablet Take 0.25 mg by mouth in the morning and at bedtime.   03/22/2023   rosuvastatin (CRESTOR) 5 MG tablet Take 5 mg by mouth at bedtime.   03/21/2023  traZODone (DESYREL) 50 MG tablet Take 50 mg by mouth at bedtime.   03/21/2023   Scheduled:  acetaminophen  650 mg Oral Once   allopurinol  50 mg Oral Daily   vitamin B-12  500 mcg Oral Daily   diphenhydrAMINE  25 mg Oral Once   feeding supplement  1 Container Oral TID BM   furosemide  40 mg Intravenous BID   gabapentin  100 mg Oral QHS   guaiFENesin  600 mg Oral BID   insulin aspart  0-5 Units Subcutaneous QHS   insulin aspart  0-9 Units Subcutaneous TID WC   neomycin-polymyxin b-dexamethasone  1 drop Right Eye TID   pantoprazole (PROTONIX) IV  40 mg Intravenous Q12H   pramipexole  0.25 mg Oral BID   rosuvastatin  5 mg Oral QHS   traZODone  50 mg Oral QHS   Continuous:  sodium chloride 10 mL/hr (03/23/23 0949)   azithromycin 500 mg (03/24/23 1024)   cefTRIAXone (ROCEPHIN)  IV 1 g (03/24/23 1026)   ZHY:QMVHQIONGEXBM **OR** acetaminophen, chlorpheniramine-HYDROcodone, guaiFENesin, ipratropium-albuterol, magnesium oxide, melatonin, ondansetron **OR** ondansetron (ZOFRAN) IV Anti-infectives (From admission, onward)    Start     Dose/Rate Route Frequency Ordered Stop   03/23/23 1000  azithromycin (ZITHROMAX) 500 mg in sodium chloride 0.9 % 250 mL IVPB        500 mg 250 mL/hr over 60 Minutes Intravenous Every 24 hours 03/22/23 1540 03/27/23 0959   03/23/23 1000  cefTRIAXone (ROCEPHIN) 1 g in sodium chloride 0.9 % 100 mL IVPB        1 g 200 mL/hr over 30 Minutes Intravenous Every 24 hours 03/22/23 1540 03/27/23 0959   03/22/23 1515  cefTRIAXone (ROCEPHIN) 1 g in sodium chloride 0.9 % 100 mL IVPB        1 g 200 mL/hr over 30 Minutes Intravenous  Once  03/22/23 1513 03/22/23 1659   03/22/23 1515  azithromycin (ZITHROMAX) 500 mg in sodium chloride 0.9 % 250 mL IVPB        500 mg 250 mL/hr over 60 Minutes Intravenous  Once 03/22/23 1513 03/22/23 1742      Scheduled Meds:  acetaminophen  650 mg Oral Once   allopurinol  50 mg Oral Daily   vitamin B-12  500 mcg Oral Daily   diphenhydrAMINE  25 mg Oral Once   feeding supplement  1 Container Oral TID BM   furosemide  40 mg Intravenous BID   gabapentin  100 mg Oral QHS   guaiFENesin  600 mg Oral BID   insulin aspart  0-5 Units Subcutaneous QHS   insulin aspart  0-9 Units Subcutaneous TID WC   neomycin-polymyxin b-dexamethasone  1 drop Right Eye TID   pantoprazole (PROTONIX) IV  40 mg Intravenous Q12H   pramipexole  0.25 mg Oral BID   rosuvastatin  5 mg Oral QHS   traZODone  50 mg Oral QHS   Continuous Infusions:  sodium chloride 10 mL/hr (03/23/23 0949)   azithromycin 500 mg (03/24/23 1024)   cefTRIAXone (ROCEPHIN)  IV 1 g (03/24/23 1026)   PRN Meds:.acetaminophen **OR** acetaminophen, chlorpheniramine-HYDROcodone, guaiFENesin, ipratropium-albuterol, magnesium oxide, melatonin, ondansetron **OR** ondansetron (ZOFRAN) IV   Assessment: Principal Problem:   Acute on chronic anemia Active Problems:   Iron deficiency anemia due to chronic blood loss   Diabetes mellitus without complication (HCC)   GERD without esophagitis   Primary insomnia   Anemia in chronic kidney disease   CKD (chronic kidney disease) stage 4, GFR 15-29 ml/min (  HCC)   HLD (hyperlipidemia)   CAD (coronary artery disease)   Acute hypoxic respiratory failure (HCC)   Elevated troponin   Leg pain  Lisa Mcneil is a 89 y.o. 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  Will give another 24 to 48 hours before proceeding with upper endoscopy If upper endoscopy does not reveal a source of iron deficiency anemia, recommend colonoscopy with possible video capsule endoscopy Patient developed constipation with iron polysaccharide complex, not a true allergy Therefore, recommend parenteral iron therapy Hold oral iron replacement as inpatient while undergoing GI workup Okay to start soft diet today  Elevated D-dimer Underwent VQ scan today, report pending   Thank you for involving me in the care of this patient.  GI will follow along with you   LOS: 2 days     03/24/2023, 1:44 PM

## 2023-03-24 NOTE — Progress Notes (Signed)
Patient ID: Lisa Mcneil, female   DOB: 1928-02-12, 89 y.o.   MRN: [id number]  Brief cardiology consult clearance note Patient seen for clearance for EGD  Patient has history of severe aortic stenosis previous non-STEMI probable coronary disease Has been reasonably stable but presented with profound anemia she had been receiving iron infusions reportedly had been consuming excess NSAIDs  Patient hemodynamically stable now with no chest pain no shortness of breath  No evidence of ACS  Cardiac exam is unchanged with significant systolic murmur no evidence of heart failure  EKG unchanged  Patient appears to be at moderate risk for EGD because of underlying aortic stenosis and probable coronary disease  Patient is cleared to proceed with the procedure there is no intervention indicated at this point to modify her risk  Full consult note to follow  [facility]

## 2023-03-24 NOTE — Progress Notes (Addendum)
Progress Note   [name] WUJ:[id number] DOB: 04/28/1928 DOA: 03/22/2023     2 DOS: the patient was seen and examined on 03/24/2023   Brief hospital course: HPI on admission per Dr. Sedalia Muta: "[name] is a 89 year old female with history of iron deficiency anemia, CKD stage IV, aortic stenosis, history of multiple blood transfusion, anemia of chronic disease, who presents to the emergency department for chief concerns of shortness of breath and hypoxia.  Per ED documentation, patient had SpO2 of 80% on room air at home...."  Labs in ED notable for -- Hbg 6.7, Cr 2.40, BUN 62, procal < 0.10, WBC normal 5.9k, hs-troponin 20>>24.  Chest x-ray showed mild airspace opacity at right base may represent pneumonia.  Pt was admitted to the hospital with GI consulted. She was ordered for blood transfusion/s, IV PPI, and started on IV antibiotics for pneumonia.   Assessment and Plan:  * Acute on chronic anemia Hx of Iron Deficiency Anemia Hbg on admission 6.7. Baseline hemoglobin level is 8.3-9.2 Suspect upper GI bleed, in setting of long term daily naproxen use vs AVM's due to aortic stenosis.   Status post 2 units pRBC transfused. --GI consulted - recs pending, follow up --EGD planned for today, delayed due to increased O2 needs, getting diuresis --Transfuse pRBC's to keep Hbg > 8 --Full liquid diet for now  --NPO / diet orders per GI --IV PPI BID --Maintain 2 PIV's  Acute respiratory failure with hypoxia (HCC) Community-acquired pneumonia Acute on Chronic HFpEF, Valvular Heart Disease (AS) Likely due to community acquired pneumonia (pt reported cough with green sputum) +/- acute on chronic anemia in setting of possible GI bleed. 8/13 -- increased O2 need to 4-5 L, CXR and BNP point to fluid overload, CHF decompensation.  Given IV Lasix overnight --Started IV Lasix 40 mg BID --NM perfusion scan ordered to rule out PE given elevated d-dimer (expected in  CKD) --Continue Rocephin, Zithromax --Scheduled Mucinex, Robitussin PRN, Tussionex PRN --IS and flutter for pulmonary hygiene --Collect sputum culture, if able --Follow blood cultures  CAD (coronary artery disease) Aspirin on hold due to potential GI bleed. Continue home statin.  Iron deficiency anemia due to chronic blood loss Patient takes iron tablets daily and also receives iron infusion at outpatient oncology clinic --Anemia panel with AM labs  CKD (chronic kidney disease) stage 4, GFR 15-29 ml/min (HCC) Cr on admission 2.40 >> 2.27 today. Baseline Cr appears ~1.9-2.2 --Monitor BMP --Renally dose meds --Avoid nephrotoxins, hypotension, IV contrast  HLD (hyperlipidemia) Continue home statin  GERD without esophagitis On IV PPI currently  Leg pain Bilateral.  Seems due to RLS.   Patient takes naproxen at home. --Trial of Requip 0.5 mg nightly --Mg-oxide BID PRN may help w symptoms, pt agreeable to try --Avoid NSAIDs given acute on chronic anemia possibly with GI bleed  Elevated troponin Mild demand ischemia in setting of acute on chronic anemia.  EKG was negative for ischemic changes.  Anemia in chronic kidney disease Baseline hemoglobin level is 8.3-9.2 See acute on chronic anemia  Primary insomnia Melatonin PRN  Diabetes mellitus without complication (HCC) Sliding scale Novolog        Subjective: Pt seen awake resting in bed.  She reports feeling relatively well, improved since admission.  She denies shortness of breath, chest or abdominal pains.  Requests me to call and update her son.  Happy to be resumed on full liquid diet with EGD delayed for today due to her increased oxygen need overnight.  Physical Exam: Vitals:   03/23/23 2014 03/23/23 2145 03/24/23 0022 03/24/23 0409  BP: (!) 155/53  (!) 150/44 (!) 131/40  Pulse: 81 79 76 70  Resp: 18  20 20   Temp: 98 F (36.7 C)  98.6 F (37 C) 99.4 F (37.4 C)  TempSrc:      SpO2: 92% 92% 90% 90%   Weight:      Height:       General exam: awake, alert, no acute distress HEENT: dry mucus membranes, hearing grossly normal  Respiratory system: CTAB, no wheezes, normal increased respiratory effort at rest on 5 L/min [place] O2. Cardiovascular system: normal S1/S2, RRR, +systolic murmur, no pedal edema.   Gastrointestinal system: soft, NT, ND Central nervous system: A&O x 4. no gross focal neurologic deficits, normal speech Extremities: moves all, no edema, normal tone Skin: dry, intact, normal temperature Psychiatry: normal mood, congruent affect, judgement and insight appear normal   Data Reviewed:  Notable labs ---- glucose 138, BUN 42, Cr 2.27 >> 1.96, Ca 8.8 improved   Hbg trend 6.7 (1 unit) >> 7.2 >> 7.0 (2nd unit) >> 9.6 >> 9.5,   BNP 1820.8 Procal < 0.10  Pending -- NM Perfusion Scan to rule out PE   Family Communication: Updated son and daughter-in-law, by phone this afternoon  Disposition: Status is: Inpatient Remains inpatient appropriate because: new and increasing oxygen requirement, ongoing GI evaluation and closely monitoring acute on chronic anemia with blood transfusions given yesterday.   Planned Discharge Destination: Home    Time spent: 46 minutes  Author: Pennie Banter, DO 03/24/2023 3:17 PM  For on call review [email or web address].

## 2023-03-24 NOTE — Consult Note (Signed)
[facility] Customer service manager [facility]) Accountable Care Organization (ACO) [facility] Liaison Note  03/24/2023  Lisa Mcneil [birth year] [id number]  Location: [facility] Liaison screened the patient remotely at [facility].  Insurance: [facility]   Lisa Mcneil is a 89 y.o. female who is a Primary Care Patient of Marisue Ivan, MD [facility]). The patient was screened for readmission hospitalization with noted high risk score for unplanned readmission risk with 1 IP in 6 months.  The patient was assessed for potential [facility]) Care Management service needs for post hospital transition for care coordination. Review of patient's electronic medical record reveals patient was gastrointestinal hemorrhage. No anticipated needs via care management services.   Plan: [facility] Liaison will continue to follow progress and disposition to asess for post hospital community care coordination/management needs.  Referral request for community care coordination: pending disposition.   [facility] Care Management/Population Health does not replace or interfere with any arrangements made by the Inpatient Transition of Care team.   For questions contact:   Elliot Cousin, RN, [facility] Liaison [place]   Population Health Office Hours MTWF  8:00 am-6:00 pm Off on Thursday [phone] mobile [phone] [Office toll free line] Office Hours are M-F 8:30 - 5 pm [email or web address]

## 2023-03-25 DIAGNOSIS — D649 Anemia, unspecified: Secondary | ICD-10-CM | POA: Diagnosis not present

## 2023-03-25 LAB — GLUCOSE, CAPILLARY
Glucose-Capillary: 129 mg/dL — ABNORMAL HIGH (ref 70–99)
Glucose-Capillary: 147 mg/dL — ABNORMAL HIGH (ref 70–99)
Glucose-Capillary: 221 mg/dL — ABNORMAL HIGH (ref 70–99)
Glucose-Capillary: 153 mg/dL — ABNORMAL HIGH (ref 70–99)

## 2023-03-25 NOTE — Plan of Care (Signed)
  Problem: Education: Goal: Ability to describe self-care measures that may prevent or decrease complications (Diabetes Survival Skills Education) will improve Outcome: Progressing Goal: Individualized Educational Video(s) Outcome: Progressing   Problem: Fluid Volume: Goal: Ability to maintain a balanced intake and output will improve Outcome: Progressing   Problem: Coping: Goal: Ability to adjust to condition or change in health will improve Outcome: Progressing   Problem: Nutritional: Goal: Maintenance of adequate nutrition will improve Outcome: Progressing Goal: Progress toward achieving an optimal weight will improve Outcome: Progressing

## 2023-03-25 NOTE — Progress Notes (Signed)
Progress Note   [name] MWU:[id number] DOB: 11-29-1927 DOA: 03/22/2023     3 DOS: the patient was seen and examined on 03/25/2023              Subjective:  Patient seen and examined at bedside this morning Has been seen by cardiologist and cleared for EGD by gastroenterologist I have discussed the case with gastroenterologist who is planning on scoping by tomorrow Denies chest pain nausea vomiting abdominal pain or cough Patient is being fed today to be made n.p.o. after 5 AM tomorrow  Brief hospital course: HPI on admission per Dr. Sedalia Muta: "[name] is a 89 year old female with history of iron deficiency anemia, CKD stage IV, aortic stenosis, history of multiple blood transfusion, anemia of chronic disease, who presents to the emergency department for chief concerns of shortness of breath and hypoxia.   Per ED documentation, patient had SpO2 of 80% on room air at home...."   Labs in ED notable for -- Hbg 6.7, Cr 2.40, BUN 62, procal < 0.10, WBC normal 5.9k, hs-troponin 20>>24.  Chest x-ray showed mild airspace opacity at right base may represent pneumonia.   Pt was admitted to the hospital with GI consulted. She was ordered for blood transfusion/s, IV PPI, and started on IV antibiotics for pneumonia.     Assessment and Plan:   * Acute on chronic anemia Hx of Iron Deficiency Anemia Hbg on admission 6.7. Baseline hemoglobin level is 8.3-9.2 Suspect upper GI bleed, in setting of long term daily naproxen use vs AVM's due to aortic stenosis.   Patient is s/p 2 units of packed RBC transfusion Gastroenterology is on board and planning scoping tomorrow We will monitor CBC closely currently patient has no evidence of ongoing bleeding Continue feeding today with plans to keep patient n.p.o. after 5 AM tomorrow   Acute respiratory failure with hypoxia (HCC) Community-acquired pneumonia Acute on Chronic HFpEF, Valvular Heart Disease (AS) Likely due to  community acquired pneumonia (pt reported cough with green sputum) +/- acute on chronic anemia in setting of possible GI bleed. 8/13 -- increased O2 need to 4-5 L, CXR and BNP point to fluid overload, CHF decompensation.  Given IV Lasix overnight Continue IV Lasix -- VQ scan done showed low probability for PE Continue Rocephin, Zithromax -- Continue scheduled Mucinex, Robitussin PRN, Tussionex PRN -- Follow-up on sputum culture if able to produce   CAD (coronary artery disease) Aspirin on hold in the setting of GI bleed Continue statin therapy   Iron deficiency anemia due to chronic blood loss Patient takes iron tablets daily and also receives iron infusion at outpatient oncology clinic Follow-up on anemia panel   CKD (chronic kidney disease) stage 4, GFR 15-29 ml/min (HCC) Cr on admission 2.40 >> 2.27 today. Baseline Cr appears ~1.9-2.2 Monitor renal function closely --Avoid nephrotoxins, hypotension, IV contrast   HLD (hyperlipidemia) Continue home statin   GERD without esophagitis Continue on IV PPI    Leg pain Bilateral.  Seems due to RLS.   Patient takes naproxen at home. -- Continue trial of Requip 0.5 mg nightly --Mg-oxide BID PRN may help w symptoms, pt agreeable to try --Avoid NSAIDs given acute on chronic anemia possibly with GI bleed   Elevated troponin Mild demand ischemia in setting of acute on chronic anemia.  EKG was negative for ischemic changes.   Anemia in chronic kidney disease Baseline hemoglobin level is 8.3-9.2 See acute on chronic anemia   Primary insomnia Melatonin PRN   Diabetes  mellitus without complication (HCC) Sliding scale Novolog     Physical Exam:   General exam: awake, alert, no acute distress HEENT: dry mucus membranes, hearing grossly normal  Respiratory system: Clear to auscultation with no wheezes on intranasal oxygen Cardiovascular system: Normal S1 and S2 Gastrointestinal system: soft, NT, ND Central nervous system: A&O x  4. no gross focal neurologic deficits, normal speech Extremities: moves all, no edema, normal tone Skin: dry, intact, normal temperature Psychiatry: normal mood, congruent affect, judgement and insight appear normal     Data Reviewed: I have reviewed patient's lab results with details as shown below    Latest Ref Rng & Units 03/25/2023    3:39 AM 03/24/2023    4:25 AM 03/23/2023    4:04 PM  CBC  WBC 4.0 - 10.5 K/uL 6.0  8.6    Hemoglobin 12.0 - 15.0 g/dL 9.8  9.5  9.6   Hematocrit 36.0 - 46.0 % 30.2  29.7  30.5   Platelets 150 - 400 K/uL 190  201           Latest Ref Rng & Units 03/25/2023    3:39 AM 03/24/2023    4:25 AM 03/23/2023    4:56 AM  BMP  Glucose 70 - 99 mg/dL 409  811  914   BUN 8 - 23 mg/dL 41  42  49   Creatinine 0.44 - 1.00 mg/dL 7.82  9.56  2.13   Sodium 135 - 145 mmol/L 135  136  139   Potassium 3.5 - 5.1 mmol/L 3.8  4.2  4.5   Chloride 98 - 111 mmol/L 100  103  107   CO2 22 - 32 mmol/L 26  25  24    Calcium 8.9 - 10.3 mg/dL 8.9  8.8  8.3      Family Communication: Updated son and daughter-in-law, by phone this afternoon   Disposition: Status is: Inpatient Remains inpatient appropriate because: new and increasing oxygen requirement, ongoing GI evaluation and closely monitoring acute on chronic anemia with blood transfusions given yesterday.    Planned Discharge Destination: Home         Vitals:   03/25/23 0440 03/25/23 0904 03/25/23 1140 03/25/23 1741  BP: (!) 129/56 (!) 144/107 (!) 140/44 (!) 134/42  Pulse: (!) 58 63 61 74  Resp: 20 18 20 18   Temp: 98.1 F (36.7 C) 97.8 F (36.6 C) (!) 97.5 F (36.4 C)   TempSrc:      SpO2: 93% 97% 98% 95%  Weight:      Height:         Author: Loyce Dys, MD 03/25/2023 6:22 PM  For on call review [email or web address].

## 2023-03-25 NOTE — Care Management Important Message (Signed)
Important Message  Patient Details  Name: Lisa Mcneil MRN: [id number] Date of Birth: 11/24/1927   Medicare Important Message Given:  Yes     Johnell Comings 03/25/2023, 10:38 AM

## 2023-03-26 ENCOUNTER — Inpatient Hospital Stay: Payer: Medicare HMO | Admitting: Anesthesiology

## 2023-03-26 ENCOUNTER — Encounter
Admission: EM | Disposition: A | Discharge status: Home / Self Care | Payer: Self-pay | Source: Home / Self Care | Attending: Internal Medicine

## 2023-03-26 ENCOUNTER — Encounter: Payer: Self-pay | Admitting: Internal Medicine

## 2023-03-26 ENCOUNTER — Inpatient Hospital Stay: Payer: Medicare HMO

## 2023-03-26 DIAGNOSIS — K31811 Angiodysplasia of stomach and duodenum with bleeding: Secondary | ICD-10-CM | POA: Diagnosis not present

## 2023-03-26 DIAGNOSIS — K922 Gastrointestinal hemorrhage, unspecified: Secondary | ICD-10-CM

## 2023-03-26 DIAGNOSIS — D649 Anemia, unspecified: Secondary | ICD-10-CM | POA: Diagnosis not present

## 2023-03-26 DIAGNOSIS — K921 Melena: Secondary | ICD-10-CM | POA: Diagnosis not present

## 2023-03-26 HISTORY — PX: ESOPHAGOGASTRODUODENOSCOPY (EGD) WITH PROPOFOL: SHX5813

## 2023-03-26 HISTORY — PX: HEMOSTASIS CONTROL: SHX6838

## 2023-03-26 HISTORY — PX: HEMOSTASIS CLIP PLACEMENT: SHX6857

## 2023-03-26 HISTORY — PX: HOT HEMOSTASIS: SHX5433

## 2023-03-26 LAB — CBC WITH DIFFERENTIAL/PLATELET
Abs Immature Granulocytes: 0.01 10*3/uL (ref 0.00–0.07)
Basophils Absolute: 0.1 10*3/uL (ref 0.0–0.1)
Basophils Relative: 1 %
Eosinophils Absolute: 0.5 10*3/uL (ref 0.0–0.5)
Eosinophils Relative: 10 %
HCT: 32 % — ABNORMAL LOW (ref 36.0–46.0)
Hemoglobin: 10.3 g/dL — ABNORMAL LOW (ref 12.0–15.0)
Immature Granulocytes: 0 %
Lymphocytes Relative: 16 %
Lymphs Abs: 0.8 10*3/uL (ref 0.7–4.0)
MCH: 30.3 pg (ref 26.0–34.0)
MCHC: 32.2 g/dL (ref 30.0–36.0)
MCV: 94.1 fL (ref 80.0–100.0)
Monocytes Absolute: 0.5 10*3/uL (ref 0.1–1.0)
Monocytes Relative: 9 %
Neutro Abs: 3.4 10*3/uL (ref 1.7–7.7)
Neutrophils Relative %: 64 %
Platelets: 184 10*3/uL (ref 150–400)
RBC: 3.4 MIL/uL — ABNORMAL LOW (ref 3.87–5.11)
RDW: 14 % (ref 11.5–15.5)
WBC: 5.3 10*3/uL (ref 4.0–10.5)
nRBC: 0 % (ref 0.0–0.2)

## 2023-03-26 LAB — BASIC METABOLIC PANEL
Anion gap: 9 (ref 5–15)
BUN: 44 mg/dL — ABNORMAL HIGH (ref 8–23)
CO2: 27 mmol/L (ref 22–32)
Calcium: 9.1 mg/dL (ref 8.9–10.3)
Chloride: 100 mmol/L (ref 98–111)
Creatinine, Ser: 2.05 mg/dL — ABNORMAL HIGH (ref 0.44–1.00)
GFR, Estimated: 22 mL/min — ABNORMAL LOW (ref >60)
Glucose, Bld: 167 mg/dL — ABNORMAL HIGH (ref 70–99)
Potassium: 4.2 mmol/L (ref 3.5–5.1)
Sodium: 136 mmol/L (ref 135–145)

## 2023-03-26 LAB — GLUCOSE, CAPILLARY
Glucose-Capillary: 152 mg/dL — ABNORMAL HIGH (ref 70–99)
Glucose-Capillary: 161 mg/dL — ABNORMAL HIGH (ref 70–99)
Glucose-Capillary: 170 mg/dL — ABNORMAL HIGH (ref 70–99)
Glucose-Capillary: 192 mg/dL — ABNORMAL HIGH (ref 70–99)
Glucose-Capillary: 258 mg/dL — ABNORMAL HIGH (ref 70–99)
Glucose-Capillary: 244 mg/dL — ABNORMAL HIGH (ref 70–99)

## 2023-03-26 SURGERY — ESOPHAGOGASTRODUODENOSCOPY (EGD) WITH PROPOFOL
Anesthesia: General

## 2023-03-26 MED ORDER — SODIUM CHLORIDE (PF) 0.9 % IJ SOLN
PREFILLED_SYRINGE | INTRAMUSCULAR | Status: DC | PRN
  Administered 2023-03-26: 2 mL

## 2023-03-26 MED ORDER — PROPOFOL 10 MG/ML IV BOLUS
INTRAVENOUS | Status: DC | PRN
Start: 2023-03-26 — End: 2023-03-26
  Administered 2023-03-26 (×2): 20 mg via INTRAVENOUS
  Administered 2023-03-26: 60 mg via INTRAVENOUS
  Administered 2023-03-26: 20 mg via INTRAVENOUS
  Administered 2023-03-26: 10 mg via INTRAVENOUS

## 2023-03-26 MED ORDER — SODIUM CHLORIDE 0.9 % IV SOLN: INTRAVENOUS | Status: DC

## 2023-03-26 MED ORDER — LIDOCAINE HCL (CARDIAC) PF 100 MG/5ML IV SOSY
PREFILLED_SYRINGE | INTRAVENOUS | Status: DC | PRN
  Administered 2023-03-26: 60 mg via INTRAVENOUS

## 2023-03-26 MED ORDER — EPINEPHRINE 1 MG/10ML IJ SOSY
PREFILLED_SYRINGE | INTRAMUSCULAR | Status: AC
  Filled 2023-03-26: qty 10

## 2023-03-26 NOTE — Op Note (Signed)
[facility] Patient Name: Lisa Mcneil Procedure Date: 03/26/2023 11:00 AM MRN: [id number] Account #: [id number] Date of Birth: 01-29-1928 Admit Type: Inpatient Age: 89 Room: [facility] ENDO ROOM 4 Gender: Female Note Status: Finalized Instrument Name: Upper Endoscope [id number] Procedure:             Upper GI endoscopy Indications:           Melena Providers:             Midge Minium MD, MD Referring MD:          Marisue Ivan (Referring MD) Medicines:             Propofol per Anesthesia Complications:         No immediate complications. Procedure:             Pre-Anesthesia Assessment:                        - Prior to the procedure, a History and Physical was                         performed, and patient medications and allergies were                         reviewed. The patient's tolerance of previous                         anesthesia was also reviewed. The risks and benefits                         of the procedure and the sedation options and risks                         were discussed with the patient. All questions were                         answered, and informed consent was obtained. Prior                         Anticoagulants: The patient has taken no anticoagulant                         or antiplatelet agents. ASA Grade Assessment: II - A                         patient with mild systemic disease. After reviewing                         the risks and benefits, the patient was deemed in                         satisfactory condition to undergo the procedure.                        - Prior to the procedure, a History and Physical was                         performed, and patient medications and allergies were  reviewed. The patient's tolerance of previous                         anesthesia was also reviewed. The risks and benefits                         of the procedure and the sedation options and risks                          were discussed with the patient. All questions were                         answered, and informed consent was obtained. Prior                         Anticoagulants: The patient has taken no anticoagulant                         or antiplatelet agents. ASA Grade Assessment: III - A                         patient with severe systemic disease. After reviewing                         the risks and benefits, the patient was deemed in                         satisfactory condition to undergo the procedure.                        After obtaining informed consent, the endoscope was                         passed under direct vision. Throughout the procedure,                         the patient's blood pressure, pulse, and oxygen                         saturations were monitored continuously. The Endoscope                         was introduced through the mouth, and advanced to the                         second part of duodenum. The upper GI endoscopy was                         accomplished without difficulty. The patient tolerated                         the procedure well. Findings:      The examined esophagus was normal.      The entire examined stomach was normal.      A single 3 mm angiodysplastic lesion with bleeding was found in the       second portion of the duodenum. Coagulation for hemostasis using argon       beam  at 2 liters/minute and 20 watts was unsuccessful. Area was       successfully injected with 2 mL of a 0.1 mg/mL solution of epinephrine       for hemostasis. Coagulation for hemostasis using monopolar probe was       successful. For hemostasis, one hemostatic clip was successfully placed       (MR conditional). Clip manufacturer: [employer]. There was no       bleeding at the end of the procedure. Impression:            - Normal esophagus.                        - Normal stomach.                        - A single bleeding angiodysplastic  lesion in the                         duodenum. Treatment not successful. Treated with argon                         beam coagulation. Injected. Treated with a monopolar                         probe. Clip (MR conditional) was placed. Clip                         manufacturer: [employer].                        - No specimens collected. Recommendation:        - Return patient to hospital ward for ongoing care.                        - Resume previous diet.                        - Continue present medications. Procedure Code(s):     --- Professional ---                        [phone], Esophagogastroduodenoscopy, flexible,                         transoral; with control of bleeding, any method Diagnosis Code(s):     --- Professional ---                        K92.1, Melena (includes Hematochezia)                        K31.811, Angiodysplasia of stomach and duodenum with                         bleeding CPT copyright 2022 American [employer] Association. All rights reserved. The codes documented in this report are preliminary and upon coder review may  be revised to meet current compliance requirements. Midge Minium MD, MD 03/26/2023 11:39:24 AM This report has been signed electronically. Number of Addenda: 0 Note Initiated On: 03/26/2023 11:00 AM Estimated Blood Loss:  Estimated blood loss: none.  [facility]

## 2023-03-26 NOTE — Plan of Care (Signed)
  Problem: Education: Goal: Knowledge of General Education information will improve Description: Including pain rating scale, medication(s)/side effects and non-pharmacologic comfort measures Outcome: Progressing   Problem: Clinical Measurements: Goal: Will remain free from infection Outcome: Progressing   Problem: Clinical Measurements: Goal: Respiratory complications will improve Outcome: Progressing   Problem: Clinical Measurements: Goal: Cardiovascular complication will be avoided Outcome: Progressing   Problem: Activity: Goal: Risk for activity intolerance will decrease Outcome: Progressing   Problem: Elimination: Goal: Will not experience complications related to urinary retention Outcome: Progressing   Problem: Pain Managment: Goal: General experience of comfort will improve Outcome: Progressing   Problem: Safety: Goal: Ability to remain free from injury will improve Outcome: Progressing

## 2023-03-26 NOTE — Anesthesia Preprocedure Evaluation (Signed)
Anesthesia Evaluation  Patient identified by MRN, date of birth, ID band Patient awake    Reviewed: Allergy & Precautions, NPO status , Patient's Chart, lab work & pertinent test results  History of Anesthesia Complications Negative for: history of anesthetic complications  Airway Mallampati: II  TM Distance: >3 FB Neck ROM: full    Dental no notable dental hx. (+) Partial Lower, Upper Dentures   Pulmonary neg pulmonary ROS, former smoker   Pulmonary exam normal        Cardiovascular hypertension, On Medications + CAD, + Past MI and +CHF  Normal cardiovascular exam+ Valvular Problems/Murmurs AS   IMPRESSIONS     1. Left ventricular ejection fraction, by estimation, is 60 to 65%. The  left ventricle has normal function. The left ventricle has no regional  wall motion abnormalities. There is severe concentric left ventricular  hypertrophy. Left ventricular diastolic   parameters are consistent with Grade II diastolic dysfunction  (pseudonormalization).   2. Right ventricular systolic function is moderately reduced. The right  ventricular size is moderately enlarged. Moderately increased right  ventricular wall thickness. There is mildly elevated pulmonary artery  systolic pressure.   3. Left atrial size was severely dilated.   4. Right atrial size was moderately dilated.   5. The mitral valve is normal in structure. Mild mitral valve  regurgitation. No evidence of mitral stenosis.   6. Tricuspid valve regurgitation is moderate.   7. The aortic valve is calcified. Aortic valve regurgitation is mild.  Severe aortic valve stenosis.   8. The inferior vena cava is normal in size with greater than 50%  respiratory variability, suggesting right atrial pressure of 3 mmHg.      Neuro/Psych negative neurological ROS  negative psych ROS   GI/Hepatic Neg liver ROS,GERD  Controlled,,  Endo/Other  negative endocrine ROSdiabetes     Renal/GU Renal disease  negative genitourinary   Musculoskeletal   Abdominal   Peds  Hematology  (+) Blood dyscrasia, anemia   Anesthesia Other Findings Past Medical History: No date: CHF (congestive heart failure) (HCC) No date: Diabetes mellitus without complication (HCC) No date: GERD (gastroesophageal reflux disease) No date: Hypertension No date: Insomnia No date: Iron deficiency anemia No date: RLS (restless legs syndrome)  Past Surgical History: No date: BLADDER REPAIR 05/13/2016: CATARACT EXTRACTION W/PHACO; Right     Comment:  Procedure: CATARACT EXTRACTION PHACO AND INTRAOCULAR               LENS PLACEMENT (IOC);  Surgeon: Galen Manila, MD;                Location: [facility] ORS;  Service: Ophthalmology;  Laterality:              Right;  [place] 1.09 AP% 21.0 CDE 14.60 Fluid Pack Lot #               [id number] 07/15/2016: CATARACT EXTRACTION W/PHACO; Left     Comment:  Procedure: CATARACT EXTRACTION PHACO AND INTRAOCULAR               LENS PLACEMENT (IOC);  Surgeon: Galen Manila, MD;                Location: [facility] ORS;  Service: Ophthalmology;  Laterality:              Left;  Lot# [id number] H [place]: 01:17.9 AP%:27.8 CDE: 21.67   BMI    Body Mass Index: 25.09 kg/m      Reproductive/Obstetrics negative OB ROS  Anesthesia Physical Anesthesia Plan  ASA: 4  Anesthesia Plan: General   Post-op Pain Management: Minimal or no pain anticipated   Induction: Intravenous  PONV Risk Score and Plan: 2 and Propofol infusion and TIVA  Airway Management Planned: Natural Airway and Nasal Cannula  Additional Equipment:   Intra-op Plan:   Post-operative Plan:   Informed Consent: I have reviewed the patients History and Physical, chart, labs and discussed the procedure including the risks, benefits and alternatives for the proposed anesthesia with the patient or authorized representative who has indicated his/her  understanding and acceptance.     Dental Advisory Given  Plan Discussed with: Anesthesiologist, CRNA and Surgeon  Anesthesia Plan Comments: (Patient consented for risks of anesthesia including but not limited to:  - adverse reactions to medications - risk of airway placement if required - damage to eyes, teeth, lips or other oral mucosa - nerve damage due to positioning  - sore throat or hoarseness - Damage to heart, brain, nerves, lungs, other parts of body or loss of life  Patient voiced understanding.)        Anesthesia Quick Evaluation

## 2023-03-26 NOTE — Transfer of Care (Signed)
Immediate Anesthesia Transfer of Care Note  [name]  Procedure(s) Performed: ESOPHAGOGASTRODUODENOSCOPY (EGD) WITH PROPOFOL HEMOSTASIS CLIP PLACEMENT HOT HEMOSTASIS (ARGON PLASMA COAGULATION/BICAP) HEMOSTASIS CONTROL  Patient Location: PACU  Anesthesia Type:General  Level of Consciousness: awake, alert , and oriented  Airway & Oxygen Therapy: Patient Spontanous Breathing  Post-op Assessment: Report given to RN and Post -op Vital signs reviewed and stable  Post vital signs: Reviewed and stable  Last Vitals:  Vitals Value Taken Time  BP 145/60 03/26/23 1211  Temp 36.7 C 03/26/23 1211  Pulse 65 03/26/23 1211  Resp 15 03/26/23 1251  SpO2 97 % 03/26/23 1211  Vitals shown include unfiled device data.  Last Pain:  Vitals:   03/26/23 1158  TempSrc:   PainSc: 0-No pain      Patients Stated Pain Goal: 0 (03/22/23 1807)  Complications: No notable events documented.

## 2023-03-26 NOTE — Plan of Care (Signed)
  Problem: Education: Goal: Knowledge of General Education information will improve Description: Including pain rating scale, medication(s)/side effects and non-pharmacologic comfort measures Outcome: Progressing   Problem: Clinical Measurements: Goal: Will remain free from infection Outcome: Progressing   Problem: Clinical Measurements: Goal: Respiratory complications will improve Outcome: Progressing   Problem: Clinical Measurements: Goal: Cardiovascular complication will be avoided Outcome: Progressing   Problem: Activity: Goal: Risk for activity intolerance will decrease Outcome: Progressing   Problem: Nutrition: Goal: Adequate nutrition will be maintained Outcome: Progressing   Problem: Elimination: Goal: Will not experience complications related to urinary retention Outcome: Progressing   Problem: Pain Managment: Goal: General experience of comfort will improve Outcome: Progressing   Problem: Safety: Goal: Ability to remain free from injury will improve Outcome: Progressing

## 2023-03-26 NOTE — Anesthesia Postprocedure Evaluation (Signed)
Anesthesia Post Note  [name]  Procedure(s) Performed: ESOPHAGOGASTRODUODENOSCOPY (EGD) WITH PROPOFOL HEMOSTASIS CLIP PLACEMENT HOT HEMOSTASIS (ARGON PLASMA COAGULATION/BICAP) HEMOSTASIS CONTROL  Patient location during evaluation: Endoscopy Anesthesia Type: General Level of consciousness: awake and alert Pain management: pain level controlled Vital Signs Assessment: post-procedure vital signs reviewed and stable Respiratory status: spontaneous breathing, nonlabored ventilation, respiratory function stable and patient connected to nasal cannula oxygen Cardiovascular status: blood pressure returned to baseline and stable Postop Assessment: no apparent nausea or vomiting Anesthetic complications: no   No notable events documented.   Last Vitals:  Vitals:   03/26/23 1158 03/26/23 1211  BP: (!) 149/54 (!) 145/60  Pulse: 64 65  Resp: 15 20  Temp:  36.7 C  SpO2: 95% 97%    Last Pain:  Vitals:   03/26/23 1158  TempSrc:   PainSc: 0-No pain                 Louie Boston

## 2023-03-26 NOTE — Progress Notes (Signed)
Patient says she ate applesauce at 5 am. "It was sitting on my table". Order for clear liquids and NPO after midnight. Anesthesia aware and will postpone EGD til 11 am. Patient aware.

## 2023-03-26 NOTE — Progress Notes (Signed)
Progress Note   [name] OZH:[id number] DOB: 08-09-1928 DOA: 03/22/2023     4 DOS: the patient was seen and examined on 03/26/2023    Subjective:  Patient seen and examined at bedside this morning Underwent EGD today that showed angiodysplasia in the second portion of the duodenum Denies nausea vomiting worsening abdominal pain   Brief hospital course: HPI on admission per Dr. Sedalia Muta: "[name] is a 89 year old female with history of iron deficiency anemia, CKD stage IV, aortic stenosis, history of multiple blood transfusion, anemia of chronic disease, who presents to the emergency department for chief concerns of shortness of breath and hypoxia.   Per ED documentation, patient had SpO2 of 80% on room air at home...."   Labs in ED notable for -- Hbg 6.7, Cr 2.40, BUN 62, procal < 0.10, WBC normal 5.9k, hs-troponin 20>>24.  Chest x-ray showed mild airspace opacity at right base may represent pneumonia.   Pt was admitted to the hospital with GI consulted. She was ordered for blood transfusion/s, IV PPI, and started on IV antibiotics for pneumonia.     Assessment and Plan:   * Acute on chronic anemia Hx of Iron Deficiency Anemia Hbg on admission 6.7. Baseline hemoglobin level is 8.3-9.2 Suspect upper GI bleed, in setting of long term daily naproxen use vs AVM's due to aortic stenosis.   Patient underwent EGD today that showed angiodysplasia in the second portion of the duodenum Patient has been placed on GI soft diet Patient has received 2 units of packed RBC transfusion Continue to monitor CBC closely   Acute respiratory failure with hypoxia (HCC) Community-acquired pneumonia Acute on Chronic HFpEF, Valvular Heart Disease (AS) Likely due to community acquired pneumonia (pt reported cough with green sputum) +/- acute on chronic anemia in setting of possible GI bleed. 8/13 -- increased O2 need to 4-5 L, CXR and BNP point to fluid overload, CHF  decompensation.  Given IV Lasix overnight Continue Lasix -- VQ scan done showed low probability for PE Continue ceftriaxone and azithromycin -- Continue scheduled Mucinex, Robitussin PRN, Tussionex PRN -- Follow-up on sputum culture if able to produce   CAD (coronary artery disease) Aspirin on hold in the setting of GI bleed Continue statin therapy   Iron deficiency anemia due to chronic blood loss Patient takes iron tablets daily and also receives iron infusion at outpatient oncology clinic Anemia panel within normal limits   CKD (chronic kidney disease) stage 4, GFR 15-29 ml/min (HCC) Cr on admission 2.40 >> 2.27 today. Baseline Cr appears ~1.9-2.2 Monitor renal function closely --Avoid nephrotoxins, hypotension, IV contrast   HLD (hyperlipidemia) Continue home statin therapy   GERD without esophagitis Continue PPI therapy   Leg pain Bilateral.  Seems due to RLS.   Patient takes naproxen at home. -- Continue trial of Requip 0.5 mg nightly --Mg-oxide BID PRN may help w symptoms, pt agreeable to try --Avoid NSAIDs given acute on chronic anemia possibly with GI bleed   Elevated troponin Mild demand ischemia in setting of acute on chronic anemia.  EKG was negative for ischemic changes.   Anemia in chronic kidney disease Baseline hemoglobin level is 8.3-9.2 See acute on chronic anemia   Primary insomnia Melatonin PRN   Diabetes mellitus without complication (HCC) Sliding scale Novolog     Physical Exam:   General exam: awake, alert, no acute distress HEENT: dry mucus membranes, hearing grossly normal  Respiratory system: Clear to auscultation with no wheezes on intranasal oxygen Cardiovascular system: Normal  S1 and S2 Gastrointestinal system: soft, NT, ND Central nervous system: A&O x 4. no gross focal neurologic deficits, normal speech Extremities: moves all, no edema, normal tone Skin: dry, intact, normal temperature Psychiatry: normal mood, congruent affect,  judgement and insight appear normal     Family Communication: Updated son and daughter-in-law, by phone this afternoon   Disposition: Status is: Inpatient Remains inpatient appropriate because: new and increasing oxygen requirement, ongoing GI evaluation and closely monitoring acute on chronic anemia with blood transfusions     Planned Discharge Destination: Home        Data Reviewed: I have reviewed patient's lab results and showed below    Latest Ref Rng & Units 03/26/2023    5:27 AM 03/25/2023    3:39 AM 03/24/2023    4:25 AM  CBC  WBC 4.0 - 10.5 K/uL 5.3  6.0  8.6   Hemoglobin 12.0 - 15.0 g/dL 40.3  9.8  9.5   Hematocrit 36.0 - 46.0 % 32.0  30.2  29.7   Platelets 150 - 400 K/uL 184  190  201        Latest Ref Rng & Units 03/26/2023    5:27 AM 03/25/2023    3:39 AM 03/24/2023    4:25 AM  BMP  Glucose 70 - 99 mg/dL 474  259  563   BUN 8 - 23 mg/dL 44  41  42   Creatinine 0.44 - 1.00 mg/dL 8.75  6.43  3.29   Sodium 135 - 145 mmol/L 136  135  136   Potassium 3.5 - 5.1 mmol/L 4.2  3.8  4.2   Chloride 98 - 111 mmol/L 100  100  103   CO2 22 - 32 mmol/L 27  26  25    Calcium 8.9 - 10.3 mg/dL 9.1  8.9  8.8       Vitals:   03/26/23 1148 03/26/23 1158 03/26/23 1211 03/26/23 1632  BP: (!) 122/46 (!) 149/54 (!) 145/60 (!) 124/42  Pulse:  64 65 63  Resp:  15 20 20   Temp:   98 F (36.7 C) 98.4 F (36.9 C)  TempSrc:      SpO2: 96% 95% 97% 92%  Weight:      Height:         Author: Loyce Dys, MD 03/26/2023 5:24 PM  For on call review [email or web address].

## 2023-03-27 ENCOUNTER — Encounter: Payer: Self-pay | Admitting: Gastroenterology

## 2023-03-27 DIAGNOSIS — K922 Gastrointestinal hemorrhage, unspecified: Secondary | ICD-10-CM

## 2023-03-27 DIAGNOSIS — D649 Anemia, unspecified: Secondary | ICD-10-CM | POA: Diagnosis not present

## 2023-03-27 DIAGNOSIS — J189 Pneumonia, unspecified organism: Secondary | ICD-10-CM

## 2023-03-27 LAB — CBC WITH DIFFERENTIAL/PLATELET
Abs Immature Granulocytes: 0.03 10*3/uL (ref 0.00–0.07)
Basophils Absolute: 0.1 10*3/uL (ref 0.0–0.1)
Basophils Relative: 1 %
Eosinophils Absolute: 0.5 10*3/uL (ref 0.0–0.5)
Eosinophils Relative: 7 %
HCT: 30.5 % — ABNORMAL LOW (ref 36.0–46.0)
Hemoglobin: 9.9 g/dL — ABNORMAL LOW (ref 12.0–15.0)
Immature Granulocytes: 0 %
Lymphocytes Relative: 10 %
Lymphs Abs: 0.8 10*3/uL (ref 0.7–4.0)
MCH: 30.6 pg (ref 26.0–34.0)
MCHC: 32.5 g/dL (ref 30.0–36.0)
MCV: 94.1 fL (ref 80.0–100.0)
Monocytes Absolute: 0.6 10*3/uL (ref 0.1–1.0)
Monocytes Relative: 7 %
Neutro Abs: 5.7 10*3/uL (ref 1.7–7.7)
Neutrophils Relative %: 75 %
Platelets: 183 10*3/uL (ref 150–400)
RBC: 3.24 MIL/uL — ABNORMAL LOW (ref 3.87–5.11)
RDW: 13.9 % (ref 11.5–15.5)
WBC: 7.6 10*3/uL (ref 4.0–10.5)
nRBC: 0 % (ref 0.0–0.2)

## 2023-03-27 LAB — BASIC METABOLIC PANEL
Anion gap: 12 (ref 5–15)
BUN: 41 mg/dL — ABNORMAL HIGH (ref 8–23)
CO2: 27 mmol/L (ref 22–32)
Calcium: 8.9 mg/dL (ref 8.9–10.3)
Chloride: 97 mmol/L — ABNORMAL LOW (ref 98–111)
Creatinine, Ser: 2.2 mg/dL — ABNORMAL HIGH (ref 0.44–1.00)
GFR, Estimated: 21 mL/min — ABNORMAL LOW (ref >60)
Glucose, Bld: 144 mg/dL — ABNORMAL HIGH (ref 70–99)
Potassium: 4 mmol/L (ref 3.5–5.1)
Sodium: 136 mmol/L (ref 135–145)

## 2023-03-27 LAB — GLUCOSE, CAPILLARY
Glucose-Capillary: 118 mg/dL — ABNORMAL HIGH (ref 70–99)
Glucose-Capillary: 240 mg/dL — ABNORMAL HIGH (ref 70–99)

## 2023-03-27 MED ORDER — BUDESONIDE 0.5 MG/2ML IN SUSP
0.5000 mg | Freq: Once | RESPIRATORY_TRACT | Status: AC
  Administered 2023-03-27: 0.5 mg via RESPIRATORY_TRACT
  Filled 2023-03-27: qty 2

## 2023-03-27 MED ORDER — IPRATROPIUM-ALBUTEROL 0.5-2.5 (3) MG/3ML IN SOLN
3.0000 mL | Freq: Once | RESPIRATORY_TRACT | Status: AC
  Administered 2023-03-27: 3 mL via RESPIRATORY_TRACT
  Filled 2023-03-27: qty 3

## 2023-03-27 NOTE — TOC Progression Note (Addendum)
Transition of Care [facility]) - Progression Note    Patient Details  Name: Lisa Mcneil MRN: [id number] Date of Birth: 1927/12/18  [facility] CM/SW Contact  Darolyn Rua, [place] Phone Number: 03/27/2023, 10:00 AM  Clinical Narrative:     8/16: Patient to discharge with 2L o2 set up with Cletis Athens with [employer] to deliver to room and complete home setup   8/12: Admitted from:Home alone. States she currently works at [employer] PCP:Dr. Burnadette Pop Pharmacy:[facility] via mail order  Current home health/prior home health/DME:No home DME needed Transportation: Patient drives herself. Her son will transport her home Patient previously independent does not have a preference of HH if needed.    Barriers to Discharge: Continued Medical Work up  Expected Discharge Plan and Services       Living arrangements for the past 2 months: Single Family Home                                       Social Determinants of Health (SDOH) Interventions SDOH Screenings   Food Insecurity: No Food Insecurity (03/24/2023)  Housing: Low Risk  (03/24/2023)  Transportation Needs: No Transportation Needs (03/24/2023)  Utilities: Not At Risk (03/24/2023)  Tobacco Use: Medium Risk (03/26/2023)    Readmission Risk Interventions    03/23/2023    4:17 PM 09/15/2022    1:07 PM  Readmission Risk Prevention Plan  Transportation Screening Complete Complete  PCP or Specialist Appt within 3-5 Days Complete Complete  HRI or Home Care Consult Complete   Social Work Consult for Recovery Care Planning/Counseling Not Complete Complete  Palliative Care Screening Not Applicable Not Applicable  Medication Review Oceanographer) Complete Complete

## 2023-03-27 NOTE — Progress Notes (Incomplete)
Progress Note   [name] WGN:[id number] DOB: 1927-10-10 DOA: 03/22/2023     5 DOS: the patient was seen and examined on 03/27/2023    Subjective:  Patient reports ***   Brief hospital course: HPI on admission per Dr. Sedalia Muta: "[name] is a 89 year old female with history of iron deficiency anemia, CKD stage IV, aortic stenosis, history of multiple blood transfusion, anemia of chronic disease, who presents to the emergency department for chief concerns of shortness of breath and hypoxia.   Per ED documentation, patient had SpO2 of 80% on room air at home...."   Labs in ED notable for -- Hbg 6.7, Cr 2.40, BUN 62, procal < 0.10, WBC normal 5.9k, hs-troponin 20>>24.  Chest x-ray showed mild airspace opacity at right base may represent pneumonia.   Pt was admitted to the hospital with GI consulted. She was ordered for blood transfusion/s, IV PPI, and started on IV antibiotics for pneumonia.     Assessment and Plan:   * Acute on chronic anemia Hx of Iron Deficiency Anemia Hbg on admission 6.7. Baseline hemoglobin level is 8.3-9.2 Suspect upper GI bleed, in setting of long term daily naproxen use vs AVM's due to aortic stenosis.   Patient underwent EGD today that showed angiodysplasia in the second portion of the duodenum Patient has been placed on GI soft diet Patient has received 2 units of packed RBC transfusion Continue to monitor CBC closely  Acute respiratory failure with hypoxia (HCC) Community-acquired pneumonia Acute on Chronic HFpEF, Valvular Heart Disease (AS) Likely due to community acquired pneumonia (pt reported cough with green sputum) +/- acute on chronic anemia in setting of possible GI bleed. 8/13 -- increased O2 need to 4-5 L, CXR and BNP point to fluid overload, CHF decompensation.  Given IV Lasix overnight Continue Lasix -- VQ scan done showed low probability for PE Continue ceftriaxone and azithromycin -- Continue scheduled  Mucinex, Robitussin PRN, Tussionex PRN -- Follow-up on sputum culture if able to produce   CAD (coronary artery disease) Aspirin on hold in the setting of GI bleed Continue statin therapy   Iron deficiency anemia due to chronic blood loss Patient takes iron tablets daily and also receives iron infusion at outpatient oncology clinic Anemia panel within normal limits   CKD (chronic kidney disease) stage 4, GFR 15-29 ml/min (HCC) Cr on admission 2.40 >> 2.27 today. Baseline Cr appears ~1.9-2.2 Monitor renal function closely --Avoid nephrotoxins, hypotension, IV contrast   HLD (hyperlipidemia) Continue home statin therapy   GERD without esophagitis Continue PPI therapy   Leg pain Bilateral.  Seems due to RLS.   Patient takes naproxen at home. -- Continue trial of Requip 0.5 mg nightly --Mg-oxide BID PRN may help w symptoms, pt agreeable to try --Avoid NSAIDs given acute on chronic anemia possibly with GI bleed   Elevated troponin Mild demand ischemia in setting of acute on chronic anemia.  EKG was negative for ischemic changes.   Anemia in chronic kidney disease Baseline hemoglobin level is 8.3-9.2 See acute on chronic anemia   Primary insomnia Melatonin PRN   Diabetes mellitus without complication (HCC) Sliding scale Novolog     Physical Exam:   General exam: awake, alert, no acute distress HEENT: dry mucus membranes, hearing grossly normal  Respiratory system: Clear to auscultation with no wheezes on intranasal oxygen Cardiovascular system: Normal S1 and S2 Gastrointestinal system: soft, NT, ND Central nervous system: A&O x 4. no gross focal neurologic deficits, normal speech Extremities: moves all, no  edema, normal tone Skin: dry, intact, normal temperature Psychiatry: normal mood, congruent affect, judgement and insight appear normal     Family Communication: Updated son and daughter-in-law, by phone this afternoon   Disposition: Status is:  Inpatient Remains inpatient appropriate because: new and increasing oxygen requirement, ongoing GI evaluation and closely monitoring acute on chronic anemia with blood transfusions     Planned Discharge Destination: Home        Data Reviewed: I have reviewed patient's lab results and showed below    Latest Ref Rng & Units 03/27/2023    3:41 AM 03/26/2023    5:27 AM 03/25/2023    3:39 AM  CBC  WBC 4.0 - 10.5 K/uL 7.6  5.3  6.0   Hemoglobin 12.0 - 15.0 g/dL 9.9  59.5  9.8   Hematocrit 36.0 - 46.0 % 30.5  32.0  30.2   Platelets 150 - 400 K/uL 183  184  190        Latest Ref Rng & Units 03/27/2023    3:41 AM 03/26/2023    5:27 AM 03/25/2023    3:39 AM  BMP  Glucose 70 - 99 mg/dL 638  756  433   BUN 8 - 23 mg/dL 41  44  41   Creatinine 0.44 - 1.00 mg/dL 2.95  1.88  4.16   Sodium 135 - 145 mmol/L 136  136  135   Potassium 3.5 - 5.1 mmol/L 4.0  4.2  3.8   Chloride 98 - 111 mmol/L 97  100  100   CO2 22 - 32 mmol/L 27  27  26    Calcium 8.9 - 10.3 mg/dL 8.9  9.1  8.9       Vitals:   03/27/23 0042 03/27/23 0100 03/27/23 0426 03/27/23 0742  BP: (!) 125/32 (!) 128/41 (!) 115/32 (!) 130/47  Pulse: 71  62 63  Resp: 18  16 20   Temp: 98.2 F (36.8 C)  98.2 F (36.8 C) 98.1 F (36.7 C)  TempSrc:      SpO2: 91%  96% 98%  Weight:      Height:         Author: Leeroy Bock, MD 03/27/2023 7:58 AM  For on call review [email or web address].

## 2023-03-27 NOTE — Progress Notes (Signed)
   Patient Saturations on Room Air at Rest = 93%  Patient Saturations on [employer] while Ambulating = 85%  Patient Saturations on 2 Liters of oxygen while Ambulating = 97%

## 2023-03-27 NOTE — Care Management Important Message (Signed)
Important Message  Patient Details  Name: Lisa Mcneil MRN: [id number] Date of Birth: 05-Jan-1928   Medicare Important Message Given:  Yes     Johnell Comings 03/27/2023, 9:54 AM

## 2023-03-27 NOTE — Discharge Instructions (Signed)
I'm glad that you are feeling better! The bleed in your small intestine was stopped during your procedure and your hemoglobin has remained steady which lets [place] know that you are no longer bleeding.  Avoid any NSAID medications such as ibuprofen, advil, alieve, motrin.   Follow up with your primary doctor within a week to recheck your blood work and recheck your oxygen saturations. They may be able to wean you off the oxygen at that time since you are recovering still.

## 2023-03-27 NOTE — Discharge Summary (Signed)
Physician Discharge Summary  [name] UYQ:[id number] DOB: 1928/03/15   Code Status: DNR Admit date: 03/22/2023 Discharge date: 03/27/2023 Disposition: Home, No home health services recommended PCP: Marisue Ivan, MD  Recommendations for Outpatient Follow-up:  Follow up with PCP within 1-2 weeks Regarding general hospital follow up and preventative care Recommend CBC Wean from oxygen as tolerated Recheck blood pressure and assess if restarting her antihypertensives is necessary   Discharge Diagnoses:  Principal Problem:   Acute on chronic anemia Active Problems:   Acute respiratory failure with hypoxia (HCC)   CAD (coronary artery disease)   Iron deficiency anemia due to chronic blood loss   CKD (chronic kidney disease) stage 4, GFR 15-29 ml/min (HCC)   HLD (hyperlipidemia)   GERD without esophagitis   Diabetes mellitus without complication (HCC)   Primary insomnia   Anemia in chronic kidney disease   Elevated troponin   Leg pain   Gastrointestinal hemorrhage   Community acquired pneumonia  Brief Hospital Course Summary: [name] is a 89 year old female with history of iron deficiency anemia, CKD stage IV, aortic stenosis, history of multiple blood transfusion, anemia of chronic disease, who presents to the emergency department for chief concerns of shortness of breath and hypoxia. Endorsed intermittent black tarry stools.   Per ED documentation, patient had SpO2 of 80% on room air at home.   Labs in ED notable for -- Hbg 6.7, Cr 2.40, BUN 62, procal < 0.10, WBC normal 5.9k, hs-troponin 20>>24.   Chest x-ray showed mild airspace opacity at right base may represent pneumonia.   2 units of pRBCs were administered and GI was consulted. Respiratory status was stable on 2Lnc and antibiotics were started for CAP.   GI bleed/acute blood loss anemia: Underwent EGD 8/15 with hemostasis achieved in duodenal lesion. Hgb remained stable day after  procedure at 9.9 on day of discharge.   Respiratory failure with hypoxia 2/2 CAP and anemia- Weaned from oxygen and satting >90% at rest but did have desaturations while ambulating- quickly recovered when sat to rest. For this reason, she was prescribed home oxygen for short term while recovering as she was anxious to go home. Had sats >90% while ambulating with 2L [place]. Discussed following up with her PCP to assess if she can be weaned off the oxygen. She was prescribed a course of antibiotics to be completed for her CAP.   HTN- she had 2 blood pressure medications held as she was borderline hypotensive in anaemic/hypovolemic state and remained within normal limits. These can be considered to restart if needed in the follow up check up.   All other chronic conditions were treated with home medications.   Discharge Condition: Good, improved Recommended discharge diet: Regular healthy diet  Consultations: GI  Procedures/Studies: EGD 8/15  Allergies as of 03/27/2023       Reactions   Atorvastatin Other (See Comments)   Codeine    Demerol [meperidine]    Naproxen Rash   Caused rash on mouth        Medication List     STOP taking these medications    amLODipine 10 MG tablet Commonly known as: NORVASC   isosorbide mononitrate 60 MG 24 hr tablet Commonly known as: IMDUR       TAKE these medications    allopurinol 100 MG tablet Commonly known as: Zyloprim Take 0.5 tablets (50 mg total) by mouth daily.   aspirin EC 81 MG tablet Take 1 tablet (81 mg total) by mouth  daily. Swallow whole.   carvedilol 3.125 MG tablet Commonly known as: COREG Take 1 tablet (3.125 mg total) by mouth 2 (two) times daily with a meal.   ferrous sulfate 325 (65 FE) MG tablet Take 325 mg by mouth daily.   furosemide 40 MG tablet Commonly known as: Lasix Take 1 tablet (40 mg total) by mouth 2 (two) times daily.   gabapentin 100 MG capsule Commonly known as: NEURONTIN Take 100 mg by mouth at  bedtime.   Melatonin 10 MG Tabs Take 20 mg by mouth at bedtime.   multivitamin with minerals tablet Take 1 tablet by mouth daily.   neomycin-polymyxin b-dexamethasone 3.5-10000-0.1 Susp Commonly known as: MAXITROL Place 1 drop into the right eye 3 (three) times daily.   nitroGLYCERIN 0.4 MG SL tablet Commonly known as: NITROSTAT Place 1 tablet (0.4 mg total) under the tongue every 5 (five) minutes as needed for chest pain.   omeprazole 20 MG capsule Commonly known as: PRILOSEC Take 20 mg by mouth daily.   pramipexole 0.25 MG tablet Commonly known as: MIRAPEX Take 0.25 mg by mouth in the morning and at bedtime.   rosuvastatin 5 MG tablet Commonly known as: CRESTOR Take 5 mg by mouth at bedtime.   traZODone 50 MG tablet Commonly known as: DESYREL Take 50 mg by mouth at bedtime.               Durable Medical Equipment  (From admission, onward)           Start     Ordered   03/27/23 1142  For home use only DME oxygen  Once       Question Answer Comment  Length of Need 6 Months   Mode or (Route) Nasal cannula   Liters per Minute 2   Frequency Continuous (stationary and portable oxygen unit needed)   Oxygen conserving device Yes   Oxygen delivery system Gas      03/27/23 1141            Follow-up Information     Marisue Ivan, MD. Schedule an appointment as soon as possible for a visit in 1 week(s).   Specialty: Family Medicine Contact information: [address] [facility] [place] [address] [phone]                 Subjective   Pt reports feeling well. She denies CP, SOB at rest. She has no abdominal pain or nausea and states she's tolerating a normal diet well. She has no difficulty with walking around her room and denies dyspnea at rest. She feels very strongly about going home today.   All questions and concerns were addressed at time of discharge.  Objective  Blood pressure (!) 120/44, pulse 67,  temperature 98.1 F (36.7 C), resp. rate 18, height 5\' 1"  (1.549 m), weight 60.2 kg, SpO2 97%.   General: Pt is alert, awake, not in acute distress Cardiovascular: RRR, S1/S2 +, no rubs, no gallops Respiratory: CTA bilaterally, no wheezing, no rhonchi Abdominal: Soft, NT, ND, bowel sounds + Extremities: no edema, no cyanosis  The results of significant diagnostics from this hospitalization (including imaging, microbiology, ancillary and laboratory) are listed below for reference.   Imaging studies: DG Chest 2 View  Result Date: 03/26/2023 CLINICAL DATA:  [id number] Pulmonary edema [id number] EXAM: CHEST - 2 VIEW COMPARISON:  03/23/2023 FINDINGS: Stable heart size. Aortic atherosclerosis. Improved aeration of both lungs with mild residual interstitial prominence bilaterally. Probable trace left pleural effusion.  No pneumothorax. IMPRESSION: Improved aeration of both lungs with mild residual interstitial prominence bilaterally, likely resolving edema. Electronically Signed   By: Duanne Guess D.O.   On: 03/26/2023 10:48   NM Pulmonary Perfusion  Result Date: 03/24/2023 CLINICAL DATA:  Pulmonary embolus suspected with high probability. Elevated D-dimer. EXAM: NUCLEAR MEDICINE PERFUSION LUNG SCAN TECHNIQUE: Perfusion images were obtained in multiple projections after intravenous injection of radiopharmaceutical. Ventilation scans intentionally deferred if perfusion scan and chest x-ray adequate for interpretation during COVID 19 epidemic. RADIOPHARMACEUTICALS:  3.73 mCi Tc-71m MAA IV COMPARISON:  Chest radiograph 03/23/2023.  CT angio chest 06/13/2022 FINDINGS: Mostly homogeneous distribution of tracer activity throughout both lungs without any focal segmental perfusion defects appreciated. IMPRESSION: Low probability of pulmonary embolus. Electronically Signed   By: Burman Nieves M.D.   On: 03/24/2023 18:07   DG Chest Port 1 View  Result Date: 03/23/2023 CLINICAL DATA:  Hypoxia.  Shortness of  breath. EXAM: PORTABLE CHEST 1 VIEW COMPARISON:  03/22/2023 FINDINGS: Cardiac enlargement. Since previous study there is interval development of pulmonary vascular congestion with perihilar and interstitial infiltrates, likely representing edema. Multifocal pneumonia would be less likely consideration. No pleural effusions. No pneumothorax. Mediastinal contours appear intact. Calcification of the aorta. Degenerative changes in the spine and shoulders. IMPRESSION: Cardiac enlargement with interval progression of diffuse perihilar and interstitial infiltrates. Electronically Signed   By: Burman Nieves M.D.   On: 03/23/2023 23:19   DG Chest 2 View  Result Date: 03/22/2023 CLINICAL DATA:  Shortness of breath.  Hypoxia. EXAM: CHEST - 2 VIEW COMPARISON:  One-view chest x-ray 09/12/2022 FINDINGS: Heart size is normal. Atherosclerotic calcifications are present at the aortic arch. Chronic interstitial coarsening is present. Mild airspace opacities are present at the right base. No other superimposed disease is present. Mild degenerative changes are noted at the shoulders bilaterally. IMPRESSION: 1. Mild airspace opacities at the right base may represent pneumonia. This could be atelectasis in this patient with chronic interstitial coarsening. No other focal airspace disease is present. 2. Aortic atherosclerosis. Electronically Signed   By: Marin Roberts M.D.   On: 03/22/2023 14:33    Labs: Basic Metabolic Panel: Recent Labs  Lab 03/23/23 0456 03/24/23 0425 03/25/23 0339 03/26/23 0527 03/27/23 0341  NA 139 136 135 136 136  K 4.5 4.2 3.8 4.2 4.0  CL 107 103 100 100 97*  CO2 24 25 26 27 27   GLUCOSE 102* 138* 166* 167* 144*  BUN 49* 42* 41* 44* 41*  CREATININE 2.27* 1.96* 1.92* 2.05* 2.20*  CALCIUM 8.3* 8.8* 8.9 9.1 8.9  MG 2.0  --  2.0  --   --    CBC: Recent Labs  Lab 03/23/23 0456 03/23/23 1604 03/24/23 0425 03/25/23 0339 03/26/23 0527 03/27/23 0341  WBC 4.1  --  8.6 6.0 5.3 7.6   NEUTROABS  --   --   --   --  3.4 5.7  HGB 7.0* 9.6* 9.5* 9.8* 10.3* 9.9*  HCT 22.3* 30.5* 29.7* 30.2* 32.0* 30.5*  MCV 97.0  --  94.0 95.6 94.1 94.1  PLT 178  --  201 190 184 183   Microbiology: Results for orders placed or performed during the hospital encounter of 03/22/23  Blood culture (routine x 2)     Status: None   Collection Time: 03/22/23  3:49 PM   Specimen: BLOOD  Result Value Ref Range Status   Specimen Description BLOOD BLOOD RIGHT WRIST  Final   Special Requests   Final    BOTTLES  DRAWN AEROBIC AND ANAEROBIC Blood Culture adequate volume   Culture   Final    NO GROWTH 5 DAYS Performed at [facility], [address] [place] [place], [place] [address]    Report Status 03/27/2023 FINAL  Final  Blood culture (routine x 2)     Status: None   Collection Time: 03/22/23  3:49 PM   Specimen: BLOOD  Result Value Ref Range Status   Specimen Description BLOOD LEFT ANTECUBITAL  Final   Special Requests   Final    BOTTLES DRAWN AEROBIC AND ANAEROBIC Blood Culture adequate volume   Culture   Final    NO GROWTH 5 DAYS Performed at [facility], [address] [place], [place] [address]    Report Status 03/27/2023 FINAL  Final   Time coordinating discharge: Over 30 minutes  Leeroy Bock, MD  [facility] 03/27/2023, 1:14 PM

## 2023-04-02 DIAGNOSIS — N184 Chronic kidney disease, stage 4 (severe): Secondary | ICD-10-CM | POA: Diagnosis not present

## 2023-04-02 DIAGNOSIS — Z8701 Personal history of pneumonia (recurrent): Secondary | ICD-10-CM | POA: Diagnosis not present

## 2023-04-02 DIAGNOSIS — Z7901 Long term (current) use of anticoagulants: Secondary | ICD-10-CM | POA: Diagnosis not present

## 2023-04-02 DIAGNOSIS — E611 Iron deficiency: Secondary | ICD-10-CM | POA: Diagnosis not present

## 2023-04-02 DIAGNOSIS — Z8719 Personal history of other diseases of the digestive system: Secondary | ICD-10-CM | POA: Diagnosis not present

## 2023-04-24 DIAGNOSIS — H16223 Keratoconjunctivitis sicca, not specified as Sjogren's, bilateral: Secondary | ICD-10-CM | POA: Diagnosis not present

## 2023-05-27 DIAGNOSIS — E119 Type 2 diabetes mellitus without complications: Secondary | ICD-10-CM | POA: Diagnosis not present

## 2023-05-27 DIAGNOSIS — Z0000 Encounter for general adult medical examination without abnormal findings: Secondary | ICD-10-CM | POA: Diagnosis not present

## 2023-05-27 DIAGNOSIS — I10 Essential (primary) hypertension: Secondary | ICD-10-CM | POA: Diagnosis not present

## 2023-05-29 DIAGNOSIS — E782 Mixed hyperlipidemia: Secondary | ICD-10-CM | POA: Diagnosis not present

## 2023-05-29 DIAGNOSIS — E611 Iron deficiency: Secondary | ICD-10-CM | POA: Diagnosis not present

## 2023-06-01 ENCOUNTER — Other Ambulatory Visit: Payer: Self-pay

## 2023-06-01 ENCOUNTER — Emergency Department
Admission: EM | Admit: 2023-06-01 | Discharge: 2023-06-01 | Discharge status: Against Medical Advice | Payer: Medicare HMO | Attending: Emergency Medicine | Admitting: Emergency Medicine

## 2023-06-01 DIAGNOSIS — Z5321 Procedure and treatment not carried out due to patient leaving prior to being seen by health care provider: Secondary | ICD-10-CM | POA: Diagnosis not present

## 2023-06-01 DIAGNOSIS — E611 Iron deficiency: Secondary | ICD-10-CM | POA: Diagnosis not present

## 2023-06-01 DIAGNOSIS — R710 Precipitous drop in hematocrit: Secondary | ICD-10-CM | POA: Diagnosis not present

## 2023-06-01 DIAGNOSIS — R195 Other fecal abnormalities: Secondary | ICD-10-CM | POA: Insufficient documentation

## 2023-06-01 LAB — CBC
HCT: 28.6 % — ABNORMAL LOW (ref 36.0–46.0)
Hemoglobin: 9.3 g/dL — ABNORMAL LOW (ref 12.0–15.0)
MCH: 31.2 pg (ref 26.0–34.0)
MCHC: 32.5 g/dL (ref 30.0–36.0)
MCV: 96 fL (ref 80.0–100.0)
Platelets: 217 10*3/uL (ref 150–400)
RBC: 2.98 MIL/uL — ABNORMAL LOW (ref 3.87–5.11)
RDW: 16.3 % — ABNORMAL HIGH (ref 11.5–15.5)
WBC: 6.4 10*3/uL (ref 4.0–10.5)
nRBC: 0 % (ref 0.0–0.2)

## 2023-06-01 LAB — COMPREHENSIVE METABOLIC PANEL
ALT: 13 U/L (ref 0–44)
AST: 18 U/L (ref 15–41)
Albumin: 4.2 g/dL (ref 3.5–5.0)
Alkaline Phosphatase: 63 U/L (ref 38–126)
Anion gap: 13 (ref 5–15)
BUN: 54 mg/dL — ABNORMAL HIGH (ref 8–23)
CO2: 26 mmol/L (ref 22–32)
Calcium: 9.4 mg/dL (ref 8.9–10.3)
Chloride: 96 mmol/L — ABNORMAL LOW (ref 98–111)
Creatinine, Ser: 2.05 mg/dL — ABNORMAL HIGH (ref 0.44–1.00)
GFR, Estimated: 22 mL/min — ABNORMAL LOW (ref >60)
Glucose, Bld: 230 mg/dL — ABNORMAL HIGH (ref 70–99)
Potassium: 4.2 mmol/L (ref 3.5–5.1)
Sodium: 135 mmol/L (ref 135–145)
Total Bilirubin: 0.6 mg/dL (ref 0.3–1.2)
Total Protein: 7.7 g/dL (ref 6.5–8.1)

## 2023-06-01 LAB — TYPE AND SCREEN
ABO/RH(D): A POS
Antibody Screen: NEGATIVE

## 2023-06-01 NOTE — ED Triage Notes (Signed)
Pt received a call from [facility] for drop in hgb. Pt has hx of blood transfusions and takes iron supplements. Pt does not report any symptoms

## 2023-06-11 DIAGNOSIS — B351 Tinea unguium: Secondary | ICD-10-CM | POA: Diagnosis not present

## 2023-06-11 DIAGNOSIS — E785 Hyperlipidemia, unspecified: Secondary | ICD-10-CM | POA: Diagnosis not present

## 2023-06-11 DIAGNOSIS — M79674 Pain in right toe(s): Secondary | ICD-10-CM | POA: Diagnosis not present

## 2023-06-11 DIAGNOSIS — M79672 Pain in left foot: Secondary | ICD-10-CM | POA: Diagnosis not present

## 2023-06-11 DIAGNOSIS — M79675 Pain in left toe(s): Secondary | ICD-10-CM | POA: Diagnosis not present

## 2023-06-11 DIAGNOSIS — E1169 Type 2 diabetes mellitus with other specified complication: Secondary | ICD-10-CM | POA: Diagnosis not present

## 2023-06-15 DIAGNOSIS — N814 Uterovaginal prolapse, unspecified: Secondary | ICD-10-CM | POA: Diagnosis not present

## 2023-06-15 DIAGNOSIS — Z4689 Encounter for fitting and adjustment of other specified devices: Secondary | ICD-10-CM | POA: Diagnosis not present

## 2023-06-24 DIAGNOSIS — I350 Nonrheumatic aortic (valve) stenosis: Secondary | ICD-10-CM | POA: Diagnosis not present

## 2023-06-24 DIAGNOSIS — N183 Chronic kidney disease, stage 3 unspecified: Secondary | ICD-10-CM | POA: Diagnosis not present

## 2023-06-24 DIAGNOSIS — E119 Type 2 diabetes mellitus without complications: Secondary | ICD-10-CM | POA: Diagnosis not present

## 2023-06-24 DIAGNOSIS — K219 Gastro-esophageal reflux disease without esophagitis: Secondary | ICD-10-CM | POA: Diagnosis not present

## 2023-06-24 DIAGNOSIS — N1832 Chronic kidney disease, stage 3b: Secondary | ICD-10-CM | POA: Diagnosis not present

## 2023-06-24 DIAGNOSIS — I10 Essential (primary) hypertension: Secondary | ICD-10-CM | POA: Diagnosis not present

## 2023-06-24 DIAGNOSIS — E782 Mixed hyperlipidemia: Secondary | ICD-10-CM | POA: Diagnosis not present

## 2023-06-24 DIAGNOSIS — I5032 Chronic diastolic (congestive) heart failure: Secondary | ICD-10-CM | POA: Diagnosis not present

## 2023-06-24 DIAGNOSIS — I214 Non-ST elevation (NSTEMI) myocardial infarction: Secondary | ICD-10-CM | POA: Diagnosis not present

## 2023-07-15 ENCOUNTER — Other Ambulatory Visit: Payer: Medicare HMO

## 2023-07-15 ENCOUNTER — Other Ambulatory Visit: Payer: Self-pay | Admitting: *Deleted

## 2023-07-15 DIAGNOSIS — D509 Iron deficiency anemia, unspecified: Secondary | ICD-10-CM

## 2023-07-16 ENCOUNTER — Encounter: Payer: Self-pay | Admitting: Oncology

## 2023-07-16 ENCOUNTER — Inpatient Hospital Stay: Payer: Medicare HMO | Attending: Oncology

## 2023-07-16 ENCOUNTER — Inpatient Hospital Stay: Payer: Medicare HMO | Admitting: Oncology

## 2023-07-16 VITALS — BP 137/63 | HR 80 | Temp 97.0°F | Resp 16 | Ht 61.0 in | Wt 143.0 lb

## 2023-07-16 DIAGNOSIS — K264 Chronic or unspecified duodenal ulcer with hemorrhage: Secondary | ICD-10-CM | POA: Insufficient documentation

## 2023-07-16 DIAGNOSIS — D509 Iron deficiency anemia, unspecified: Secondary | ICD-10-CM

## 2023-07-16 DIAGNOSIS — Z87891 Personal history of nicotine dependence: Secondary | ICD-10-CM | POA: Diagnosis not present

## 2023-07-16 LAB — CBC WITH DIFFERENTIAL/PLATELET
Abs Immature Granulocytes: 0.02 10*3/uL (ref 0.00–0.07)
Basophils Absolute: 0 10*3/uL (ref 0.0–0.1)
Basophils Relative: 1 %
Eosinophils Absolute: 0.2 10*3/uL (ref 0.0–0.5)
Eosinophils Relative: 4 %
HCT: 21.7 % — ABNORMAL LOW (ref 36.0–46.0)
Hemoglobin: 6.7 g/dL — CL (ref 12.0–15.0)
Immature Granulocytes: 0 %
Lymphocytes Relative: 14 %
Lymphs Abs: 0.8 10*3/uL (ref 0.7–4.0)
MCH: 32.1 pg (ref 26.0–34.0)
MCHC: 30.9 g/dL (ref 30.0–36.0)
MCV: 103.8 fL — ABNORMAL HIGH (ref 80.0–100.0)
Monocytes Absolute: 0.4 10*3/uL (ref 0.1–1.0)
Monocytes Relative: 6 %
Neutro Abs: 4.4 10*3/uL (ref 1.7–7.7)
Neutrophils Relative %: 75 %
Platelets: 194 10*3/uL (ref 150–400)
RBC: 2.09 MIL/uL — ABNORMAL LOW (ref 3.87–5.11)
RDW: 12.9 % (ref 11.5–15.5)
WBC: 5.8 10*3/uL (ref 4.0–10.5)
nRBC: 0 % (ref 0.0–0.2)

## 2023-07-16 LAB — IRON AND TIBC
Iron: 44 ug/dL (ref 28–170)
Saturation Ratios: 11 % (ref 10.4–31.8)
TIBC: 416 ug/dL (ref 250–450)
UIBC: 372 ug/dL

## 2023-07-16 LAB — SAMPLE TO BLOOD BANK

## 2023-07-16 LAB — FERRITIN: Ferritin: 9 ng/mL — ABNORMAL LOW (ref 11–307)

## 2023-07-16 NOTE — Progress Notes (Signed)
[facility]  [phone] [phone]  ID: Lisa Mcneil OB: 05/28/1928  MR#: [id number]  JOA#:[id number]  Patient Care Team: Marisue Ivan, MD as PCP - General (Family Medicine) Jeralyn Ruths, MD as Consulting Physician (Hematology and Oncology)  CHIEF COMPLAINT:  Iron deficiency anemia.  INTERVAL HISTORY: Patient returns to clinic today for repeat laboratory work and further evaluation.  She was evaluated in the emergency room in August 2024 and found to have a hemoglobin of less than 7 and received 2 units of packed red blood cells.  She currently feels well and is asymptomatic.  She does not complain of any weakness or fatigue.  She remains active and continues to work every Monday, Wednesday, and Friday.  She has dark stools, but attributes this to her iron supplementation. She has no neurologic complaints. She denies any recent fevers or illnesses. She has a good appetite and denies weight loss.  She denies any chest pain, shortness of breath, cough, or hemoptysis.  She denies any nausea, vomiting, consultation, or diarrhea. She has no melena or hematochezia.  She has no urinary complaints.  Patient offers no specific complaints today.  REVIEW OF SYSTEMS:   Review of Systems  Constitutional: Negative.  Negative for fever, malaise/fatigue and weight loss.  Respiratory: Negative.  Negative for cough and shortness of breath.   Cardiovascular: Negative.  Negative for chest pain and leg swelling.  Gastrointestinal: Negative.  Negative for abdominal pain, blood in stool and melena.  Genitourinary: Negative.  Negative for hematuria.  Musculoskeletal: Negative.  Negative for back pain.  Skin: Negative.  Negative for rash.  Neurological: Negative.  Negative for dizziness, focal weakness, weakness and headaches.  Psychiatric/Behavioral: Negative.  The patient is not nervous/anxious.     As per HPI. Otherwise, a complete review of systems is  negative.  PAST MEDICAL HISTORY: Past Medical History:  Diagnosis Date   CHF (congestive heart failure) (HCC)    Diabetes mellitus without complication (HCC)    GERD (gastroesophageal reflux disease)    Hypertension    Insomnia    Iron deficiency anemia    RLS (restless legs syndrome)     PAST SURGICAL HISTORY: Past Surgical History:  Procedure Laterality Date   BLADDER REPAIR     CATARACT EXTRACTION W/PHACO Right 05/13/2016   Procedure: CATARACT EXTRACTION PHACO AND INTRAOCULAR LENS PLACEMENT (IOC);  Surgeon: Galen Manila, MD;  Location: [facility] ORS;  Service: Ophthalmology;  Laterality: Right;  [place] 1.09 AP% 21.0 CDE 14.60 Fluid Pack Lot # [id number]   CATARACT EXTRACTION W/PHACO Left 07/15/2016   Procedure: CATARACT EXTRACTION PHACO AND INTRAOCULAR LENS PLACEMENT (IOC);  Surgeon: Galen Manila, MD;  Location: [facility] ORS;  Service: Ophthalmology;  Laterality: Left;  Lot# [id number] H [place]: 01:17.9 AP%:27.8 CDE: 21.67    ESOPHAGOGASTRODUODENOSCOPY (EGD) WITH PROPOFOL N/A 03/26/2023   Procedure: ESOPHAGOGASTRODUODENOSCOPY (EGD) WITH PROPOFOL;  Surgeon: Midge Minium, MD;  Location: [facility] ENDOSCOPY;  Service: Endoscopy;  Laterality: N/A;   HEMOSTASIS CLIP PLACEMENT  03/26/2023   Procedure: HEMOSTASIS CLIP PLACEMENT;  Surgeon: Midge Minium, MD;  Location: [facility] ENDOSCOPY;  Service: Endoscopy;;   HEMOSTASIS CONTROL  03/26/2023   Procedure: HEMOSTASIS CONTROL;  Surgeon: Midge Minium, MD;  Location: [facility] ENDOSCOPY;  Service: Endoscopy;;   HOT HEMOSTASIS  03/26/2023   Procedure: HOT HEMOSTASIS (ARGON PLASMA COAGULATION/BICAP);  Surgeon: Midge Minium, MD;  Location[facility];  Service: Endoscopy;;    FAMILY HISTORY: Reviewed and unchanged. No reported history of malignancy or chronic disease.     ADVANCED DIRECTIVES:  HEALTH MAINTENANCE: Social History   Tobacco Use   Smoking status: Former   Smokeless tobacco: Never  Advertising account planner   Vaping status: Never Used  Substance Use Topics    Alcohol use: No   Drug use: No     Colonoscopy:  PAP:  Bone density:  Lipid panel:  Allergies  Allergen Reactions   Atorvastatin Other (See Comments)   Codeine    Demerol [Meperidine]    Naproxen Rash    Caused rash on mouth    Current Outpatient Medications  Medication Sig Dispense Refill   allopurinol (ZYLOPRIM) 100 MG tablet Take 0.5 tablets (50 mg total) by mouth daily. 15 tablet 0   aspirin EC 81 MG tablet Take 1 tablet (81 mg total) by mouth daily. Swallow whole. 30 tablet 12   carvedilol (COREG) 3.125 MG tablet Take 1 tablet (3.125 mg total) by mouth 2 (two) times daily with a meal. 60 tablet 0   ferrous sulfate 325 (65 FE) MG tablet Take 325 mg by mouth daily.     gabapentin (NEURONTIN) 100 MG capsule Take 100 mg by mouth at bedtime.     Melatonin 10 MG TABS Take 20 mg by mouth at bedtime.     Multiple Vitamins-Minerals (MULTIVITAMIN WITH MINERALS) tablet Take 1 tablet by mouth daily.     neomycin-polymyxin b-dexamethasone (MAXITROL) 3.5-10000-0.1 SUSP Place 1 drop into the right eye 3 (three) times daily.     nitroGLYCERIN (NITROSTAT) 0.4 MG SL tablet Place 1 tablet (0.4 mg total) under the tongue every 5 (five) minutes as needed for chest pain. 20 tablet 12   omeprazole (PRILOSEC) 20 MG capsule Take 20 mg by mouth daily.     pramipexole (MIRAPEX) 0.25 MG tablet Take 0.25 mg by mouth in the morning and at bedtime.     rosuvastatin (CRESTOR) 5 MG tablet Take 5 mg by mouth at bedtime.     traZODone (DESYREL) 50 MG tablet Take 50 mg by mouth at bedtime.     furosemide (LASIX) 40 MG tablet Take 1 tablet (40 mg total) by mouth 2 (two) times daily. 60 tablet 0   No current facility-administered medications for this visit.   Facility-Administered Medications Ordered in Other Visits  Medication Dose Route Frequency Provider Last Rate Last Admin   0.9 %  sodium chloride infusion   Intravenous PRN Rushie Chestnut, PA-C 10 mL/hr at 03/26/23 1110 Continued from Pre-op at  03/26/23 1110    OBJECTIVE: Vitals:   07/16/23 1426  BP: 137/63  Pulse: 80  Resp: 16  Temp: (!) 97 F (36.1 C)  SpO2: 97%     Body mass index is 27.02 kg/m.    ECOG FS:0 - Asymptomatic  General: Well-developed, well-nourished, no acute distress. Eyes: Pink conjunctiva, anicteric sclera. HEENT: Normocephalic, moist mucous membranes. Lungs: No audible wheezing or coughing. Heart: Regular rate and rhythm. Abdomen: Soft, nontender, no obvious distention. Musculoskeletal: No edema, cyanosis, or clubbing. Neuro: Alert, answering all questions appropriately. Cranial nerves grossly intact. Skin: No rashes or petechiae noted. Psych: Normal affect.  LAB RESULTS:  Lab Results  Component Value Date   NA 135 06/01/2023   K 4.2 06/01/2023   CL 96 (L) 06/01/2023   CO2 26 06/01/2023   GLUCOSE 230 (H) 06/01/2023   BUN 54 (H) 06/01/2023   CREATININE 2.05 (H) 06/01/2023   CALCIUM 9.4 06/01/2023   PROT 7.7 06/01/2023   ALBUMIN 4.2 06/01/2023   AST 18 06/01/2023   ALT 13 06/01/2023   ALKPHOS  63 06/01/2023   BILITOT 0.6 06/01/2023   GFRNONAA 22 (L) 06/01/2023   GFRAA >60 02/17/2015    Lab Results  Component Value Date   WBC 5.8 07/16/2023   NEUTROABS 4.4 07/16/2023   HGB 6.7 (LL) 07/16/2023   HCT 21.7 (L) 07/16/2023   MCV 103.8 (H) 07/16/2023   PLT 194 07/16/2023   Lab Results  Component Value Date   IRON 38 03/25/2023   TIBC 322 03/25/2023   IRONPCTSAT 12 03/25/2023   Lab Results  Component Value Date   FERRITIN 235 03/25/2023     STUDIES: No results found.  ASSESSMENT: Iron deficiency anemia.  PLAN:    Iron deficiency anemia: EGD on March 26, 2023 revealed an actively bleeding duodenal lesion which was cauterized at the time achieving hemostasis.  Patient's hemoglobin has dropped again and she is currently 6.7.  She does not wish to return tomorrow for transfusion since she does not want to take off work.  Therefore, patient will return to clinic next week for  1 unit of packed red blood cells.  She would then return to clinic in 6 weeks for laboratory work, further evaluation, and additional transfusion if needed.   GI bleed: Follow-up with GI for repeat luminal evaluation as needed.  I spent a total of 30 minutes reviewing chart data, face-to-face evaluation with the patient, counseling and coordination of care as detailed above.    Patient expressed understanding and was in agreement with this plan. She also understands that She can call clinic at any time with any questions, concerns, or complaints.    Jeralyn Ruths, MD   07/16/2023 3:18 PM

## 2023-07-17 ENCOUNTER — Ambulatory Visit: Payer: Medicare HMO

## 2023-07-22 ENCOUNTER — Other Ambulatory Visit: Payer: Self-pay | Admitting: *Deleted

## 2023-07-22 ENCOUNTER — Inpatient Hospital Stay: Payer: Medicare HMO

## 2023-07-22 ENCOUNTER — Telehealth: Payer: Self-pay | Admitting: *Deleted

## 2023-07-22 ENCOUNTER — Other Ambulatory Visit: Payer: Self-pay | Admitting: Nurse Practitioner

## 2023-07-22 DIAGNOSIS — Z87891 Personal history of nicotine dependence: Secondary | ICD-10-CM | POA: Diagnosis not present

## 2023-07-22 DIAGNOSIS — D509 Iron deficiency anemia, unspecified: Secondary | ICD-10-CM | POA: Diagnosis not present

## 2023-07-22 DIAGNOSIS — K264 Chronic or unspecified duodenal ulcer with hemorrhage: Secondary | ICD-10-CM | POA: Diagnosis not present

## 2023-07-22 LAB — CBC WITH DIFFERENTIAL/PLATELET
Abs Immature Granulocytes: 0.03 10*3/uL (ref 0.00–0.07)
Basophils Absolute: 0 10*3/uL (ref 0.0–0.1)
Basophils Relative: 1 %
Eosinophils Absolute: 0.1 10*3/uL (ref 0.0–0.5)
Eosinophils Relative: 2 %
HCT: 22.8 % — ABNORMAL LOW (ref 36.0–46.0)
Hemoglobin: 6.8 g/dL — CL (ref 12.0–15.0)
Immature Granulocytes: 1 %
Lymphocytes Relative: 13 %
Lymphs Abs: 0.8 10*3/uL (ref 0.7–4.0)
MCH: 30.4 pg (ref 26.0–34.0)
MCHC: 29.8 g/dL — ABNORMAL LOW (ref 30.0–36.0)
MCV: 101.8 fL — ABNORMAL HIGH (ref 80.0–100.0)
Monocytes Absolute: 0.4 10*3/uL (ref 0.1–1.0)
Monocytes Relative: 6 %
Neutro Abs: 4.4 10*3/uL (ref 1.7–7.7)
Neutrophils Relative %: 77 %
Platelets: 217 10*3/uL (ref 150–400)
RBC: 2.24 MIL/uL — ABNORMAL LOW (ref 3.87–5.11)
RDW: 14.4 % (ref 11.5–15.5)
WBC: 5.7 10*3/uL (ref 4.0–10.5)
nRBC: 0 % (ref 0.0–0.2)

## 2023-07-22 LAB — IRON AND TIBC
Iron: 121 ug/dL (ref 28–170)
Saturation Ratios: 26 % (ref 10.4–31.8)
TIBC: 463 ug/dL — ABNORMAL HIGH (ref 250–450)
UIBC: 342 ug/dL

## 2023-07-22 LAB — PREPARE RBC (CROSSMATCH)

## 2023-07-22 NOTE — Telephone Encounter (Signed)
1424-Critical hgb of 6.8- called by [facility] in [facility] lab. Read back process performed w/lab tech. Dr. Orlie Dakin informed of critical value- plan to order 1 unit of blood tomorrow. Read back process performed with lab tech

## 2023-07-22 NOTE — Progress Notes (Signed)
Orders entered on Dr. Milinda Cave behalf for blood transfusion.

## 2023-07-23 ENCOUNTER — Inpatient Hospital Stay: Payer: Medicare HMO

## 2023-07-23 DIAGNOSIS — D509 Iron deficiency anemia, unspecified: Secondary | ICD-10-CM

## 2023-07-23 DIAGNOSIS — K264 Chronic or unspecified duodenal ulcer with hemorrhage: Secondary | ICD-10-CM | POA: Diagnosis not present

## 2023-07-23 DIAGNOSIS — Z87891 Personal history of nicotine dependence: Secondary | ICD-10-CM | POA: Diagnosis not present

## 2023-07-23 MED ORDER — SODIUM CHLORIDE 0.9% IV SOLUTION
250.0000 mL | INTRAVENOUS | Status: DC
  Administered 2023-07-23: 100 mL via INTRAVENOUS
  Filled 2023-07-23: qty 250

## 2023-07-24 LAB — TYPE AND SCREEN
ABO/RH(D): A POS
Antibody Screen: NEGATIVE
Unit division: 0

## 2023-07-24 LAB — BPAM RBC
Blood Product Expiration Date: 202501022359
ISSUE DATE / TIME: 202412120826
Unit Type and Rh: 6200

## 2023-07-30 DIAGNOSIS — E113293 Type 2 diabetes mellitus with mild nonproliferative diabetic retinopathy without macular edema, bilateral: Secondary | ICD-10-CM | POA: Diagnosis not present

## 2023-07-31 DIAGNOSIS — E113293 Type 2 diabetes mellitus with mild nonproliferative diabetic retinopathy without macular edema, bilateral: Secondary | ICD-10-CM | POA: Diagnosis not present

## 2023-07-31 DIAGNOSIS — E113292 Type 2 diabetes mellitus with mild nonproliferative diabetic retinopathy without macular edema, left eye: Secondary | ICD-10-CM | POA: Diagnosis not present

## 2023-07-31 DIAGNOSIS — Z961 Presence of intraocular lens: Secondary | ICD-10-CM | POA: Diagnosis not present

## 2023-07-31 DIAGNOSIS — H04123 Dry eye syndrome of bilateral lacrimal glands: Secondary | ICD-10-CM | POA: Diagnosis not present

## 2023-07-31 DIAGNOSIS — E113291 Type 2 diabetes mellitus with mild nonproliferative diabetic retinopathy without macular edema, right eye: Secondary | ICD-10-CM | POA: Diagnosis not present

## 2023-08-07 DIAGNOSIS — H04123 Dry eye syndrome of bilateral lacrimal glands: Secondary | ICD-10-CM | POA: Diagnosis not present

## 2023-08-07 DIAGNOSIS — Z0100 Encounter for examination of eyes and vision without abnormal findings: Secondary | ICD-10-CM | POA: Diagnosis not present

## 2023-08-17 DIAGNOSIS — H524 Presbyopia: Secondary | ICD-10-CM | POA: Diagnosis not present

## 2023-08-24 ENCOUNTER — Other Ambulatory Visit: Payer: Self-pay | Admitting: *Deleted

## 2023-08-24 DIAGNOSIS — D509 Iron deficiency anemia, unspecified: Secondary | ICD-10-CM

## 2023-08-25 ENCOUNTER — Inpatient Hospital Stay: Payer: Medicare HMO | Attending: Oncology

## 2023-08-25 DIAGNOSIS — K264 Chronic or unspecified duodenal ulcer with hemorrhage: Secondary | ICD-10-CM | POA: Diagnosis not present

## 2023-08-25 DIAGNOSIS — Z87891 Personal history of nicotine dependence: Secondary | ICD-10-CM | POA: Diagnosis not present

## 2023-08-25 DIAGNOSIS — D509 Iron deficiency anemia, unspecified: Secondary | ICD-10-CM | POA: Diagnosis not present

## 2023-08-25 LAB — CBC WITH DIFFERENTIAL/PLATELET
Abs Immature Granulocytes: 0.03 10*3/uL (ref 0.00–0.07)
Basophils Absolute: 0.1 10*3/uL (ref 0.0–0.1)
Basophils Relative: 1 %
Eosinophils Absolute: 0.5 10*3/uL (ref 0.0–0.5)
Eosinophils Relative: 7 %
HCT: 28 % — ABNORMAL LOW (ref 36.0–46.0)
Hemoglobin: 9 g/dL — ABNORMAL LOW (ref 12.0–15.0)
Immature Granulocytes: 0 %
Lymphocytes Relative: 12 %
Lymphs Abs: 0.8 10*3/uL (ref 0.7–4.0)
MCH: 31.5 pg (ref 26.0–34.0)
MCHC: 32.1 g/dL (ref 30.0–36.0)
MCV: 97.9 fL (ref 80.0–100.0)
Monocytes Absolute: 0.4 10*3/uL (ref 0.1–1.0)
Monocytes Relative: 6 %
Neutro Abs: 5.2 10*3/uL (ref 1.7–7.7)
Neutrophils Relative %: 74 %
Platelets: 209 10*3/uL (ref 150–400)
RBC: 2.86 MIL/uL — ABNORMAL LOW (ref 3.87–5.11)
RDW: 15.2 % (ref 11.5–15.5)
WBC: 7 10*3/uL (ref 4.0–10.5)
nRBC: 0 % (ref 0.0–0.2)

## 2023-08-25 LAB — IRON AND TIBC
Iron: 220 ug/dL — ABNORMAL HIGH (ref 28–170)
Saturation Ratios: 57 % — ABNORMAL HIGH (ref 10.4–31.8)
TIBC: 386 ug/dL (ref 250–450)
UIBC: 166 ug/dL

## 2023-08-25 LAB — SAMPLE TO BLOOD BANK

## 2023-08-27 ENCOUNTER — Inpatient Hospital Stay: Payer: Medicare HMO | Admitting: Oncology

## 2023-08-27 ENCOUNTER — Inpatient Hospital Stay: Payer: Medicare HMO

## 2023-08-27 ENCOUNTER — Encounter: Payer: Self-pay | Admitting: Oncology

## 2023-08-27 VITALS — BP 148/63 | HR 83 | Temp 97.0°F | Resp 14 | Wt 141.0 lb

## 2023-08-27 DIAGNOSIS — D509 Iron deficiency anemia, unspecified: Secondary | ICD-10-CM | POA: Diagnosis not present

## 2023-08-27 DIAGNOSIS — Z87891 Personal history of nicotine dependence: Secondary | ICD-10-CM | POA: Diagnosis not present

## 2023-08-27 DIAGNOSIS — K264 Chronic or unspecified duodenal ulcer with hemorrhage: Secondary | ICD-10-CM | POA: Diagnosis not present

## 2023-08-27 NOTE — Progress Notes (Signed)
[facility]  Telephone:(336) [phone] Fax:(336) [phone]  ID: Arraya Platten OB: [birth year]  MR#: [id number]  GUY#:[id number]  Patient Care Team: Marisue Ivan, MD as PCP - General (Family Medicine) Jeralyn Ruths, MD as Consulting Physician (Hematology and Oncology)  CHIEF COMPLAINT:  Iron deficiency anemia.  INTERVAL HISTORY: Patient returns to clinic today for repeat laboratory work and further evaluation.  She continues to feel well and remains asymptomatic.  She does not complain of any weakness or fatigue today.  She is anxious to leave clinic to go to the casino in [place] today.  She remains active and does not complain of any weakness or fatigue.  She continues to work every Monday, Wednesday, and Friday at the [employer] in [place].  She has no neurologic complaints. She denies any recent fevers or illnesses. She has a good appetite and denies weight loss.  She denies any chest pain, shortness of breath, cough, or hemoptysis.  She denies any nausea, vomiting, consultation, or diarrhea. She has no melena or hematochezia.  She has no urinary complaints.  Patient offers no specific complaints today.  REVIEW OF SYSTEMS:   Review of Systems  Constitutional: Negative.  Negative for fever, malaise/fatigue and weight loss.  Respiratory: Negative.  Negative for cough and shortness of breath.   Cardiovascular: Negative.  Negative for chest pain and leg swelling.  Gastrointestinal: Negative.  Negative for abdominal pain, blood in stool and melena.  Genitourinary: Negative.  Negative for hematuria.  Musculoskeletal: Negative.  Negative for back pain.  Skin: Negative.  Negative for rash.  Neurological: Negative.  Negative for dizziness, focal weakness, weakness and headaches.  Psychiatric/Behavioral: Negative.  The patient is not nervous/anxious.     As per HPI. Otherwise, a complete review of systems is negative.  PAST MEDICAL HISTORY: Past Medical  History:  Diagnosis Date   CHF (congestive heart failure) (HCC)    Diabetes mellitus without complication (HCC)    GERD (gastroesophageal reflux disease)    Hypertension    Insomnia    Iron deficiency anemia    RLS (restless legs syndrome)     PAST SURGICAL HISTORY: Past Surgical History:  Procedure Laterality Date   BLADDER REPAIR     CATARACT EXTRACTION W/PHACO Right 05/13/2016   Procedure: CATARACT EXTRACTION PHACO AND INTRAOCULAR LENS PLACEMENT (IOC);  Surgeon: Galen Manila, MD;  Location: [facility] ORS;  Service: Ophthalmology;  Laterality: Right;  [place] 1.09 AP% 21.0 CDE 14.60 Fluid Pack Lot # [id number]   CATARACT EXTRACTION W/PHACO Left 07/15/2016   Procedure: CATARACT EXTRACTION PHACO AND INTRAOCULAR LENS PLACEMENT (IOC);  Surgeon: Galen Manila, MD;  Location: [facility] ORS;  Service: Ophthalmology;  Laterality: Left;  Lot# [id number] H [place]: 01:17.9 AP%:27.8 CDE: 21.67    ESOPHAGOGASTRODUODENOSCOPY (EGD) WITH PROPOFOL N/A 03/26/2023   Procedure: ESOPHAGOGASTRODUODENOSCOPY (EGD) WITH PROPOFOL;  Surgeon: Midge Minium, MD;  Location: [facility] ENDOSCOPY;  Service: Endoscopy;  Laterality: N/A;   HEMOSTASIS CLIP PLACEMENT  03/26/2023   Procedure: HEMOSTASIS CLIP PLACEMENT;  Surgeon: Midge Minium, MD;  Location: [facility] ENDOSCOPY;  Service: Endoscopy;;   HEMOSTASIS CONTROL  03/26/2023   Procedure: HEMOSTASIS CONTROL;  Surgeon: Midge Minium, MD;  Location: [facility] ENDOSCOPY;  Service: Endoscopy;;   HOT HEMOSTASIS  03/26/2023   Procedure: HOT HEMOSTASIS (ARGON PLASMA COAGULATION/BICAP);  Surgeon: Midge Minium, MD;  Location: [facility] ENDOSCOPY;  Service: Endoscopy;;    FAMILY HISTORY: Reviewed and unchanged. No reported history of malignancy or chronic disease.     ADVANCED DIRECTIVES:    HEALTH MAINTENANCE: Social History  Tobacco Use   Smoking status: Former   Smokeless tobacco: Never  Vaping Use   Vaping status: Never Used  Substance Use Topics   Alcohol use: No   Drug use: No      Colonoscopy:  PAP:  Bone density:  Lipid panel:  Allergies  Allergen Reactions   Atorvastatin Other (See Comments)   Codeine    Demerol [Meperidine]    Naproxen Rash    Caused rash on mouth    Current Outpatient Medications  Medication Sig Dispense Refill   allopurinol (ZYLOPRIM) 100 MG tablet Take 0.5 tablets (50 mg total) by mouth daily. 15 tablet 0   aspirin EC 81 MG tablet Take 1 tablet (81 mg total) by mouth daily. Swallow whole. 30 tablet 12   carvedilol (COREG) 3.125 MG tablet Take 1 tablet (3.125 mg total) by mouth 2 (two) times daily with a meal. 60 tablet 0   ferrous sulfate 325 (65 FE) MG tablet Take 325 mg by mouth daily.     furosemide (LASIX) 40 MG tablet Take 1 tablet (40 mg total) by mouth 2 (two) times daily. 60 tablet 0   gabapentin (NEURONTIN) 100 MG capsule Take 100 mg by mouth at bedtime.     Melatonin 10 MG TABS Take 20 mg by mouth at bedtime.     Multiple Vitamins-Minerals (MULTIVITAMIN WITH MINERALS) tablet Take 1 tablet by mouth daily.     neomycin-polymyxin b-dexamethasone (MAXITROL) 3.5-10000-0.1 SUSP Place 1 drop into the right eye 3 (three) times daily.     nitroGLYCERIN (NITROSTAT) 0.4 MG SL tablet Place 1 tablet (0.4 mg total) under the tongue every 5 (five) minutes as needed for chest pain. 20 tablet 12   omeprazole (PRILOSEC) 20 MG capsule Take 20 mg by mouth daily.     pramipexole (MIRAPEX) 0.25 MG tablet Take 0.25 mg by mouth in the morning and at bedtime.     rosuvastatin (CRESTOR) 5 MG tablet Take 5 mg by mouth at bedtime.     traZODone (DESYREL) 50 MG tablet Take 50 mg by mouth at bedtime.     No current facility-administered medications for this visit.   Facility-Administered Medications Ordered in Other Visits  Medication Dose Route Frequency Provider Last Rate Last Admin   0.9 %  sodium chloride infusion   Intravenous PRN Rushie Chestnut, PA-C 10 mL/hr at 03/26/23 1110 Continued from Pre-op at 03/26/23 1110     OBJECTIVE: Vitals:   08/27/23 0858  BP: (!) 148/63  Pulse: 83  Resp: 14  Temp: (!) 97 F (36.1 C)  SpO2: 98%     Body mass index is 26.64 kg/m.    ECOG FS:0 - Asymptomatic  General: Well-developed, well-nourished, no acute distress. Eyes: Pink conjunctiva, anicteric sclera. HEENT: Normocephalic, moist mucous membranes. Lungs: No audible wheezing or coughing. Heart: Regular rate and rhythm. Abdomen: Soft, nontender, no obvious distention. Musculoskeletal: No edema, cyanosis, or clubbing. Neuro: Alert, answering all questions appropriately. Cranial nerves grossly intact. Skin: No rashes or petechiae noted. Psych: Normal affect.  LAB RESULTS:  Lab Results  Component Value Date   NA 135 06/01/2023   K 4.2 06/01/2023   CL 96 (L) 06/01/2023   CO2 26 06/01/2023   GLUCOSE 230 (H) 06/01/2023   BUN 54 (H) 06/01/2023   CREATININE 2.05 (H) 06/01/2023   CALCIUM 9.4 06/01/2023   PROT 7.7 06/01/2023   ALBUMIN 4.2 06/01/2023   AST 18 06/01/2023   ALT 13 06/01/2023   ALKPHOS 63 06/01/2023   BILITOT  0.6 06/01/2023   GFRNONAA 22 (L) 06/01/2023   GFRAA >60 02/17/2015    Lab Results  Component Value Date   WBC 7.0 08/25/2023   NEUTROABS 5.2 08/25/2023   HGB 9.0 (L) 08/25/2023   HCT 28.0 (L) 08/25/2023   MCV 97.9 08/25/2023   PLT 209 08/25/2023   Lab Results  Component Value Date   IRON 220 (H) 08/25/2023   TIBC 386 08/25/2023   IRONPCTSAT 57 (H) 08/25/2023   Lab Results  Component Value Date   FERRITIN 9 (L) 07/16/2023     STUDIES: No results found.  ASSESSMENT: Iron deficiency anemia.  PLAN:    Iron deficiency anemia: EGD on March 26, 2023 revealed an actively bleeding duodenal lesion which was cauterized at the time achieving hemostasis.  Patient's hemoglobin has improved to 9.0 with normal iron stores.  She continues to remain asymptomatic and active.  No intervention is needed at this time.  Return to clinic in 6 weeks with repeat laboratory work,  further evaluation, and additional treatment if necessary.     GI bleed: Follow-up with GI for repeat luminal evaluation as needed.  I spent a total of 20 minutes reviewing chart data, face-to-face evaluation with the patient, counseling and coordination of care as detailed above.   Patient expressed understanding and was in agreement with this plan. She also understands that She can call clinic at any time with any questions, concerns, or complaints.    Jeralyn Ruths, MD   08/27/2023 9:55 AM

## 2023-08-27 NOTE — Progress Notes (Signed)
Patient states that she is doing very good, no new questions or concerns for the doctor today.

## 2023-10-06 ENCOUNTER — Inpatient Hospital Stay: Payer: Medicare HMO | Attending: Oncology

## 2023-10-06 DIAGNOSIS — Z87891 Personal history of nicotine dependence: Secondary | ICD-10-CM | POA: Insufficient documentation

## 2023-10-06 DIAGNOSIS — R531 Weakness: Secondary | ICD-10-CM | POA: Diagnosis not present

## 2023-10-06 DIAGNOSIS — D509 Iron deficiency anemia, unspecified: Secondary | ICD-10-CM | POA: Diagnosis not present

## 2023-10-06 DIAGNOSIS — R5383 Other fatigue: Secondary | ICD-10-CM | POA: Diagnosis not present

## 2023-10-06 DIAGNOSIS — K264 Chronic or unspecified duodenal ulcer with hemorrhage: Secondary | ICD-10-CM | POA: Diagnosis not present

## 2023-10-06 LAB — CBC WITH DIFFERENTIAL/PLATELET
Abs Immature Granulocytes: 0.01 10*3/uL (ref 0.00–0.07)
Basophils Absolute: 0 10*3/uL (ref 0.0–0.1)
Basophils Relative: 1 %
Eosinophils Absolute: 0.2 10*3/uL (ref 0.0–0.5)
Eosinophils Relative: 5 %
HCT: 25.2 % — ABNORMAL LOW (ref 36.0–46.0)
Hemoglobin: 7.6 g/dL — ABNORMAL LOW (ref 12.0–15.0)
Immature Granulocytes: 0 %
Lymphocytes Relative: 17 %
Lymphs Abs: 0.6 10*3/uL — ABNORMAL LOW (ref 0.7–4.0)
MCH: 32.5 pg (ref 26.0–34.0)
MCHC: 30.2 g/dL (ref 30.0–36.0)
MCV: 107.7 fL — ABNORMAL HIGH (ref 80.0–100.0)
Monocytes Absolute: 0.4 10*3/uL (ref 0.1–1.0)
Monocytes Relative: 10 %
Neutro Abs: 2.4 10*3/uL (ref 1.7–7.7)
Neutrophils Relative %: 67 %
Platelets: 180 10*3/uL (ref 150–400)
RBC: 2.34 MIL/uL — ABNORMAL LOW (ref 3.87–5.11)
RDW: 15.4 % (ref 11.5–15.5)
WBC: 3.6 10*3/uL — ABNORMAL LOW (ref 4.0–10.5)
nRBC: 0 % (ref 0.0–0.2)

## 2023-10-06 LAB — SAMPLE TO BLOOD BANK

## 2023-10-06 LAB — IRON AND TIBC
Iron: 282 ug/dL — ABNORMAL HIGH (ref 28–170)
Saturation Ratios: 79 % — ABNORMAL HIGH (ref 10.4–31.8)
TIBC: 356 ug/dL (ref 250–450)
UIBC: 74 ug/dL

## 2023-10-06 LAB — FERRITIN: Ferritin: 25 ng/mL (ref 11–307)

## 2023-10-08 ENCOUNTER — Emergency Department
Admission: EM | Admit: 2023-10-08 | Discharge: 2023-10-08 | Disposition: A | Discharge status: Home / Self Care | Payer: Medicare HMO | Attending: Emergency Medicine | Admitting: Emergency Medicine

## 2023-10-08 ENCOUNTER — Other Ambulatory Visit: Payer: Self-pay

## 2023-10-08 ENCOUNTER — Inpatient Hospital Stay (HOSPITAL_BASED_OUTPATIENT_CLINIC_OR_DEPARTMENT_OTHER): Payer: Medicare HMO | Admitting: Oncology

## 2023-10-08 ENCOUNTER — Emergency Department: Payer: Medicare HMO

## 2023-10-08 ENCOUNTER — Encounter: Payer: Self-pay | Admitting: Emergency Medicine

## 2023-10-08 ENCOUNTER — Encounter: Payer: Self-pay | Admitting: Oncology

## 2023-10-08 ENCOUNTER — Inpatient Hospital Stay: Payer: Medicare HMO

## 2023-10-08 VITALS — BP 129/41 | HR 58 | Temp 96.4°F | Resp 18 | Wt 140.0 lb

## 2023-10-08 DIAGNOSIS — Z87891 Personal history of nicotine dependence: Secondary | ICD-10-CM | POA: Diagnosis not present

## 2023-10-08 DIAGNOSIS — E1122 Type 2 diabetes mellitus with diabetic chronic kidney disease: Secondary | ICD-10-CM | POA: Diagnosis not present

## 2023-10-08 DIAGNOSIS — I129 Hypertensive chronic kidney disease with stage 1 through stage 4 chronic kidney disease, or unspecified chronic kidney disease: Secondary | ICD-10-CM | POA: Diagnosis not present

## 2023-10-08 DIAGNOSIS — R531 Weakness: Secondary | ICD-10-CM | POA: Diagnosis not present

## 2023-10-08 DIAGNOSIS — N184 Chronic kidney disease, stage 4 (severe): Secondary | ICD-10-CM | POA: Diagnosis not present

## 2023-10-08 DIAGNOSIS — I130 Hypertensive heart and chronic kidney disease with heart failure and stage 1 through stage 4 chronic kidney disease, or unspecified chronic kidney disease: Secondary | ICD-10-CM | POA: Diagnosis not present

## 2023-10-08 DIAGNOSIS — I509 Heart failure, unspecified: Secondary | ICD-10-CM | POA: Diagnosis not present

## 2023-10-08 DIAGNOSIS — K264 Chronic or unspecified duodenal ulcer with hemorrhage: Secondary | ICD-10-CM | POA: Diagnosis not present

## 2023-10-08 DIAGNOSIS — N189 Chronic kidney disease, unspecified: Secondary | ICD-10-CM | POA: Diagnosis not present

## 2023-10-08 DIAGNOSIS — D509 Iron deficiency anemia, unspecified: Secondary | ICD-10-CM

## 2023-10-08 DIAGNOSIS — D649 Anemia, unspecified: Secondary | ICD-10-CM | POA: Diagnosis not present

## 2023-10-08 DIAGNOSIS — N3090 Cystitis, unspecified without hematuria: Secondary | ICD-10-CM | POA: Insufficient documentation

## 2023-10-08 DIAGNOSIS — R918 Other nonspecific abnormal finding of lung field: Secondary | ICD-10-CM | POA: Diagnosis not present

## 2023-10-08 DIAGNOSIS — R5383 Other fatigue: Secondary | ICD-10-CM | POA: Diagnosis not present

## 2023-10-08 DIAGNOSIS — J101 Influenza due to other identified influenza virus with other respiratory manifestations: Secondary | ICD-10-CM | POA: Insufficient documentation

## 2023-10-08 LAB — URINALYSIS, W/ REFLEX TO CULTURE (INFECTION SUSPECTED)
Bilirubin Urine: NEGATIVE
Glucose, UA: NEGATIVE mg/dL
Hgb urine dipstick: NEGATIVE
Ketones, ur: NEGATIVE mg/dL
Nitrite: NEGATIVE
Protein, ur: NEGATIVE mg/dL
Specific Gravity, Urine: 1.008 (ref 1.005–1.030)
WBC, UA: >50 WBC/hpf (ref 0–5)
pH: 7 (ref 5.0–8.0)

## 2023-10-08 LAB — TYPE AND SCREEN
ABO/RH(D): A POS
Antibody Screen: NEGATIVE

## 2023-10-08 LAB — RESP PANEL BY RT-PCR (RSV, FLU A&B, COVID)  RVPGX2
Influenza A by PCR: POSITIVE — AB
Influenza B by PCR: NEGATIVE
Resp Syncytial Virus by PCR: NEGATIVE
SARS Coronavirus 2 by RT PCR: NEGATIVE

## 2023-10-08 LAB — COMPREHENSIVE METABOLIC PANEL
ALT: 14 U/L (ref 0–44)
AST: 18 U/L (ref 15–41)
Albumin: 3.4 g/dL — ABNORMAL LOW (ref 3.5–5.0)
Alkaline Phosphatase: 67 U/L (ref 38–126)
Anion gap: 11 (ref 5–15)
BUN: 44 mg/dL — ABNORMAL HIGH (ref 8–23)
CO2: 25 mmol/L (ref 22–32)
Calcium: 9 mg/dL (ref 8.9–10.3)
Chloride: 96 mmol/L — ABNORMAL LOW (ref 98–111)
Creatinine, Ser: 2.28 mg/dL — ABNORMAL HIGH (ref 0.44–1.00)
GFR, Estimated: 20 mL/min — ABNORMAL LOW (ref >60)
Glucose, Bld: 206 mg/dL — ABNORMAL HIGH (ref 70–99)
Potassium: 4.4 mmol/L (ref 3.5–5.1)
Sodium: 132 mmol/L — ABNORMAL LOW (ref 135–145)
Total Bilirubin: 0.7 mg/dL (ref 0.0–1.2)
Total Protein: 6.8 g/dL (ref 6.5–8.1)

## 2023-10-08 LAB — CBC
HCT: 26.5 % — ABNORMAL LOW (ref 36.0–46.0)
Hemoglobin: 8.1 g/dL — ABNORMAL LOW (ref 12.0–15.0)
MCH: 32.9 pg (ref 26.0–34.0)
MCHC: 30.6 g/dL (ref 30.0–36.0)
MCV: 107.7 fL — ABNORMAL HIGH (ref 80.0–100.0)
Platelets: 177 10*3/uL (ref 150–400)
RBC: 2.46 MIL/uL — ABNORMAL LOW (ref 3.87–5.11)
RDW: 15 % (ref 11.5–15.5)
WBC: 4.8 10*3/uL (ref 4.0–10.5)
nRBC: 0 % (ref 0.0–0.2)

## 2023-10-08 MED ORDER — CEPHALEXIN 500 MG PO CAPS: 500.0000 mg | ORAL_CAPSULE | Freq: Two times a day (BID) | ORAL | 0 refills | Status: DC

## 2023-10-08 NOTE — Progress Notes (Signed)
 Patient states she's feeling exhausted and has been feeling weak & tired for the past few days now.

## 2023-10-08 NOTE — ED Notes (Signed)
 Pt states that they were sent here from the cancer center to "get blood." Pt states that they don't have any pain but that they are tired and have felt weak for the last few days. Pt is A&Ox4 and can stand and pivot without any issues.

## 2023-10-08 NOTE — ED Provider Notes (Signed)
 [facility] Provider Note    Event Date/Time   First MD Initiated Contact with Patient 10/08/23 1105     (approximate)   History   Chief Complaint: Abnormal Lab   HPI  Lisa Mcneil is a 89 y.o. female with a history of hypertension diabetes GERD CHF chronic anemia who comes the ED due to fatigue for the past week.  No dizziness or syncope, no chest pain shortness of breath or other pain complaints.  No fever or chills or cough, no vomiting or black or bloody stool or dysuria.  Has a history of anemia requiring transfusion.          Physical Exam   Triage Vital Signs: ED Triage Vitals  Encounter Vitals Group     BP 10/08/23 1029 (!) 126/53     Systolic BP Percentile --      Diastolic BP Percentile --      Pulse Rate 10/08/23 1029 61     Resp 10/08/23 1029 16     Temp 10/08/23 1029 (!) 97.5 F (36.4 C)     Temp Source 10/08/23 1029 Oral     SpO2 10/08/23 1029 100 %     Weight 10/08/23 1034 139 lb (63 kg)     Height 10/08/23 1034 5\' 1"  (1.549 m)     Head Circumference --      Peak Flow --      Pain Score 10/08/23 1034 0     Pain Loc --      Pain Education --      Exclude from Growth Chart --     Most recent vital signs: Vitals:   10/08/23 1200 10/08/23 1230  BP: (!) 78/27 (!) 135/55  Pulse: 80 74  Resp:  16  Temp:    SpO2: 92% 98%    General: Awake, no distress.  CV:  Good peripheral perfusion.  Regular rate rhythm Resp:  Normal effort.  Clear to auscultation bilaterally Abd:  No distention.  Soft nontender Other:  No rash, no lower extremity edema   ED Results / Procedures / Treatments   Labs (all labs ordered are listed, but only abnormal results are displayed) Labs Reviewed  RESP PANEL BY RT-PCR (RSV, FLU A&B, COVID)  [id number] - Abnormal; Notable for the following components:      Result Value   Influenza A by PCR POSITIVE (*)    All other components within normal limits  COMPREHENSIVE METABOLIC PANEL - Abnormal;  Notable for the following components:   Sodium 132 (*)    Chloride 96 (*)    Glucose, Bld 206 (*)    BUN 44 (*)    Creatinine, Ser 2.28 (*)    Albumin 3.4 (*)    GFR, Estimated 20 (*)    All other components within normal limits  CBC - Abnormal; Notable for the following components:   RBC 2.46 (*)    Hemoglobin 8.1 (*)    HCT 26.5 (*)    MCV 107.7 (*)    All other components within normal limits  URINALYSIS, W/ REFLEX TO CULTURE (INFECTION SUSPECTED) - Abnormal; Notable for the following components:   Color, Urine YELLOW (*)    APPearance HAZY (*)    Leukocytes,Ua LARGE (*)    Bacteria, UA FEW (*)    All other components within normal limits  URINE CULTURE  TYPE AND SCREEN     EKG    RADIOLOGY Chest x-ray interpreted by me, unremarkable.  Radiology report  reviewed   PROCEDURES:  Procedures   MEDICATIONS ORDERED IN ED: Medications - No data to display   IMPRESSION / MDM / ASSESSMENT AND PLAN / ED COURSE  I reviewed the triage vital signs and the nursing notes.  DDx: Anemia, AKI, electrolyte derangement, COVID, influenza, UTI  Patient's presentation is most consistent with acute presentation with potential threat to life or bodily function.  Patient presents with fatigue.  No focal symptoms, exam is benign with reassuring vital signs.  Initial serum labs unremarkable with hemoglobin of 8.1 representing stable chronic anemia.  Creatinine 2.3 consistent with her baseline CKD stage IV.  She is tolerating oral intake and in good spirits in the ED.   Clinical Course as of 10/08/23 1337  Thu Oct 08, 2023  1335 Positive for flu and UTI.  Patient feeling well, able to continue her usual ADLs at home, no dizziness, tolerating oral intake.  Offered admission but she feels comfortable with outpatient management, will send Rx for UTI. [PS]    Clinical Course User Index [PS] Sharman Cheek, MD     FINAL CLINICAL IMPRESSION(S) / ED DIAGNOSES   Final diagnoses:   Cystitis  Influenza A  Stage 4 chronic kidney disease (HCC)     Rx / DC Orders   ED Discharge Orders          Ordered    cephALEXin (KEFLEX) 500 MG capsule  2 times daily        10/08/23 1336             Note:  This document was prepared using Dragon voice recognition software and may include unintentional dictation errors.   Sharman Cheek, MD 10/08/23 [phone]

## 2023-10-08 NOTE — Progress Notes (Signed)
 [facility]  Telephone:(336) [phone] Fax:(336) [phone]  ID: Lisa Mcneil OB: 05/11/1928  MR#: [id number]  GEX#:[id number]  Patient Care Team: Marisue Ivan, MD as PCP - General (Family Medicine) Jeralyn Ruths, MD as Consulting Physician (Hematology and Oncology)  CHIEF COMPLAINT:  Iron deficiency anemia.  INTERVAL HISTORY: Patient returns to clinic today for repeat laboratory work, further evaluation, and consideration of IV iron or blood transfusion.  Patient has significant weakness and fatigue over the past several weeks.  She lives alone and states that she cannot go home in this condition.  Typically, she remains active and works every Monday, Wednesday, and Friday at the [employer] in [place].  She has no neurologic complaints. She denies any recent fevers or illnesses. She has a good appetite and denies weight loss.  She denies any chest pain, shortness of breath, cough, or hemoptysis.  She denies any nausea, vomiting, consultation, or diarrhea. She has no melena or hematochezia.  She has no urinary complaints.  Patient feels generally terrible, but offers no further specific complaints today.  REVIEW OF SYSTEMS:   Review of Systems  Constitutional:  Positive for malaise/fatigue. Negative for fever and weight loss.  Respiratory: Negative.  Negative for cough and shortness of breath.   Cardiovascular: Negative.  Negative for chest pain and leg swelling.  Gastrointestinal: Negative.  Negative for abdominal pain, blood in stool and melena.  Genitourinary: Negative.  Negative for hematuria.  Musculoskeletal: Negative.  Negative for back pain.  Skin: Negative.  Negative for rash.  Neurological:  Positive for weakness. Negative for dizziness, focal weakness and headaches.  Psychiatric/Behavioral: Negative.  The patient is not nervous/anxious.     As per HPI. Otherwise, a complete review of systems is negative.  PAST MEDICAL HISTORY: Past  Medical History:  Diagnosis Date   CHF (congestive heart failure) (HCC)    Diabetes mellitus without complication (HCC)    GERD (gastroesophageal reflux disease)    Hypertension    Insomnia    Iron deficiency anemia    RLS (restless legs syndrome)     PAST SURGICAL HISTORY: Past Surgical History:  Procedure Laterality Date   BLADDER REPAIR     CATARACT EXTRACTION W/PHACO Right 05/13/2016   Procedure: CATARACT EXTRACTION PHACO AND INTRAOCULAR LENS PLACEMENT (IOC);  Surgeon: Galen Manila, MD;  Location: [facility] ORS;  Service: Ophthalmology;  Laterality: Right;  [place] 1.09 AP% 21.0 CDE 14.60 Fluid Pack Lot # [id number]   CATARACT EXTRACTION W/PHACO Left 07/15/2016   Procedure: CATARACT EXTRACTION PHACO AND INTRAOCULAR LENS PLACEMENT (IOC);  Surgeon: Galen Manila, MD;  Location: [facility] ORS;  Service: Ophthalmology;  Laterality: Left;  Lot# [id number] H [place]: 01:17.9 AP%:27.8 CDE: 21.67    ESOPHAGOGASTRODUODENOSCOPY (EGD) WITH PROPOFOL N/A 03/26/2023   Procedure: ESOPHAGOGASTRODUODENOSCOPY (EGD) WITH PROPOFOL;  Surgeon: Midge Minium, MD;  Location: [facility] ENDOSCOPY;  Service: Endoscopy;  Laterality: N/A;   HEMOSTASIS CLIP PLACEMENT  03/26/2023   Procedure: HEMOSTASIS CLIP PLACEMENT;  Surgeon: Midge Minium, MD;  Location: [facility] ENDOSCOPY;  Service: Endoscopy;;   HEMOSTASIS CONTROL  03/26/2023   Procedure: HEMOSTASIS CONTROL;  Surgeon: Midge Minium, MD;  Location: [facility] ENDOSCOPY;  Service: Endoscopy;;   HOT HEMOSTASIS  03/26/2023   Procedure: HOT HEMOSTASIS (ARGON PLASMA COAGULATION/BICAP);  Surgeon: Midge Minium, MD;  Location: [facility] ENDOSCOPY;  Service: Endoscopy;;    FAMILY HISTORY: Reviewed and unchanged. No reported history of malignancy or chronic disease.     ADVANCED DIRECTIVES:    HEALTH MAINTENANCE: Social History   Tobacco Use   Smoking  status: Former   Smokeless tobacco: Never  Advertising account planner   Vaping status: Never Used  Substance Use Topics   Alcohol use: No   Drug use: No      Colonoscopy:  PAP:  Bone density:  Lipid panel:  Allergies  Allergen Reactions   Atorvastatin Other (See Comments)   Codeine    Demerol [Meperidine]    Naproxen Rash    Caused rash on mouth    No current facility-administered medications for this visit.   Current Outpatient Medications  Medication Sig Dispense Refill   allopurinol (ZYLOPRIM) 100 MG tablet Take 0.5 tablets (50 mg total) by mouth daily. 15 tablet 0   aspirin EC 81 MG tablet Take 1 tablet (81 mg total) by mouth daily. Swallow whole. 30 tablet 12   carvedilol (COREG) 3.125 MG tablet Take 1 tablet (3.125 mg total) by mouth 2 (two) times daily with a meal. 60 tablet 0   ferrous sulfate 325 (65 FE) MG tablet Take 325 mg by mouth daily.     furosemide (LASIX) 40 MG tablet Take 1 tablet (40 mg total) by mouth 2 (two) times daily. 60 tablet 0   gabapentin (NEURONTIN) 100 MG capsule Take 100 mg by mouth at bedtime.     Melatonin 10 MG TABS Take 20 mg by mouth at bedtime.     Multiple Vitamins-Minerals (MULTIVITAMIN WITH MINERALS) tablet Take 1 tablet by mouth daily.     neomycin-polymyxin b-dexamethasone (MAXITROL) 3.5-10000-0.1 SUSP Place 1 drop into the right eye 3 (three) times daily.     nitroGLYCERIN (NITROSTAT) 0.4 MG SL tablet Place 1 tablet (0.4 mg total) under the tongue every 5 (five) minutes as needed for chest pain. 20 tablet 12   omeprazole (PRILOSEC) 20 MG capsule Take 20 mg by mouth daily.     pramipexole (MIRAPEX) 0.25 MG tablet Take 0.25 mg by mouth in the morning and at bedtime.     rosuvastatin (CRESTOR) 5 MG tablet Take 5 mg by mouth at bedtime.     traZODone (DESYREL) 50 MG tablet Take 50 mg by mouth at bedtime.     Facility-Administered Medications Ordered in Other Visits  Medication Dose Route Frequency Provider Last Rate Last Admin   0.9 %  sodium chloride infusion   Intravenous PRN Rushie Chestnut, PA-C 10 mL/hr at 03/26/23 1110 Continued from Pre-op at 03/26/23 1110     OBJECTIVE: Vitals:   10/08/23 0957  BP: (!) 129/41  Pulse: (!) 58  Resp: 18  Temp: (!) 96.4 F (35.8 C)  SpO2: 92%     Body mass index is 26.45 kg/m.    ECOG FS:2 - Symptomatic, <50% confined to bed  General: Well-developed, well-nourished, no acute distress.  Sitting in a wheelchair. Eyes: Pink conjunctiva, anicteric sclera. HEENT: Normocephalic, moist mucous membranes. Lungs: No audible wheezing or coughing. Heart: Regular rate and rhythm. Abdomen: Soft, nontender, no obvious distention. Musculoskeletal: No edema, cyanosis, or clubbing. Neuro: Alert, answering all questions appropriately. Cranial nerves grossly intact. Skin: No rashes or petechiae noted. Psych: Normal affect.   LAB RESULTS:  Lab Results  Component Value Date   NA 132 (L) 10/08/2023   K 4.4 10/08/2023   CL 96 (L) 10/08/2023   CO2 25 10/08/2023   GLUCOSE 206 (H) 10/08/2023   BUN 44 (H) 10/08/2023   CREATININE 2.28 (H) 10/08/2023   CALCIUM 9.0 10/08/2023   PROT 6.8 10/08/2023   ALBUMIN 3.4 (L) 10/08/2023   AST 18 10/08/2023   ALT 14 10/08/2023  ALKPHOS 67 10/08/2023   BILITOT 0.7 10/08/2023   GFRNONAA 20 (L) 10/08/2023   GFRAA >60 02/17/2015    Lab Results  Component Value Date   WBC 4.8 10/08/2023   NEUTROABS 2.4 10/06/2023   HGB 8.1 (L) 10/08/2023   HCT 26.5 (L) 10/08/2023   MCV 107.7 (H) 10/08/2023   PLT 177 10/08/2023   Lab Results  Component Value Date   IRON 282 (H) 10/06/2023   TIBC 356 10/06/2023   IRONPCTSAT 79 (H) 10/06/2023   Lab Results  Component Value Date   FERRITIN 25 10/06/2023     STUDIES: No results found.  ASSESSMENT: Iron deficiency anemia.  PLAN:    Iron deficiency anemia: EGD on March 26, 2023 revealed an actively bleeding duodenal lesion which was cauterized at the time achieving hemostasis.  Patient's hemoglobin has decreased to 7.6, but her iron stores continue to be within normal limits.  Recommended returning to clinic tomorrow for blood  transfusion, patient states she cannot go home feeling as bad as she does, therefore she was transferred to the ER for further evaluation and blood transfusion today.  Return to clinic in 1 week for repeat lab work and further evaluation.   GI bleed: Follow-up with GI for repeat luminal evaluation as needed.  I spent a total of 30 minutes reviewing chart data, face-to-face evaluation with the patient, counseling and coordination of care as detailed above.  Patient expressed understanding and was in agreement with this plan. She also understands that She can call clinic at any time with any questions, concerns, or complaints.    Jeralyn Ruths, MD   10/08/2023 12:09 PM

## 2023-10-08 NOTE — ED Triage Notes (Signed)
 Patient to ED via POV for abnormal lab- hgb 7.6. Labs drawn 2 days ago due to patient feeling weak for the past week. Hx of blood transfusion

## 2023-10-08 NOTE — ED Notes (Signed)
 Pt ambulated to toilet in the room with no difficulty or assistance needed from staff. Pt tolerated activity well. Bed is in the lowest, locked position with call bell in reach.

## 2023-10-08 NOTE — ED Notes (Signed)
Pt given a lunch box and water.

## 2023-10-09 ENCOUNTER — Other Ambulatory Visit: Payer: Self-pay

## 2023-10-09 ENCOUNTER — Inpatient Hospital Stay: Payer: Medicare HMO

## 2023-10-09 ENCOUNTER — Emergency Department: Payer: Medicare HMO

## 2023-10-09 ENCOUNTER — Encounter: Payer: Self-pay | Admitting: Family Medicine

## 2023-10-09 ENCOUNTER — Inpatient Hospital Stay
Admission: EM | Admit: 2023-10-09 | Discharge: 2023-10-12 | DRG: 193 | Disposition: A | Discharge status: Home / Self Care | Payer: Medicare HMO | Attending: Internal Medicine | Admitting: Internal Medicine

## 2023-10-09 DIAGNOSIS — E1169 Type 2 diabetes mellitus with other specified complication: Secondary | ICD-10-CM | POA: Diagnosis present

## 2023-10-09 DIAGNOSIS — J9601 Acute respiratory failure with hypoxia: Secondary | ICD-10-CM | POA: Diagnosis not present

## 2023-10-09 DIAGNOSIS — J441 Chronic obstructive pulmonary disease with (acute) exacerbation: Secondary | ICD-10-CM | POA: Diagnosis present

## 2023-10-09 DIAGNOSIS — I10 Essential (primary) hypertension: Secondary | ICD-10-CM | POA: Diagnosis present

## 2023-10-09 DIAGNOSIS — J9811 Atelectasis: Secondary | ICD-10-CM | POA: Diagnosis present

## 2023-10-09 DIAGNOSIS — E1129 Type 2 diabetes mellitus with other diabetic kidney complication: Secondary | ICD-10-CM | POA: Diagnosis present

## 2023-10-09 DIAGNOSIS — K219 Gastro-esophageal reflux disease without esophagitis: Secondary | ICD-10-CM | POA: Diagnosis present

## 2023-10-09 DIAGNOSIS — G2581 Restless legs syndrome: Secondary | ICD-10-CM | POA: Diagnosis present

## 2023-10-09 DIAGNOSIS — Z7982 Long term (current) use of aspirin: Secondary | ICD-10-CM

## 2023-10-09 DIAGNOSIS — J101 Influenza due to other identified influenza virus with other respiratory manifestations: Secondary | ICD-10-CM | POA: Diagnosis not present

## 2023-10-09 DIAGNOSIS — Z888 Allergy status to other drugs, medicaments and biological substances status: Secondary | ICD-10-CM

## 2023-10-09 DIAGNOSIS — D631 Anemia in chronic kidney disease: Secondary | ICD-10-CM | POA: Diagnosis not present

## 2023-10-09 DIAGNOSIS — J432 Centrilobular emphysema: Secondary | ICD-10-CM | POA: Diagnosis present

## 2023-10-09 DIAGNOSIS — N184 Chronic kidney disease, stage 4 (severe): Secondary | ICD-10-CM | POA: Diagnosis present

## 2023-10-09 DIAGNOSIS — Z8719 Personal history of other diseases of the digestive system: Secondary | ICD-10-CM

## 2023-10-09 DIAGNOSIS — J129 Viral pneumonia, unspecified: Secondary | ICD-10-CM | POA: Diagnosis present

## 2023-10-09 DIAGNOSIS — E611 Iron deficiency: Secondary | ICD-10-CM | POA: Diagnosis present

## 2023-10-09 DIAGNOSIS — E782 Mixed hyperlipidemia: Secondary | ICD-10-CM | POA: Diagnosis present

## 2023-10-09 DIAGNOSIS — I2510 Atherosclerotic heart disease of native coronary artery without angina pectoris: Secondary | ICD-10-CM | POA: Diagnosis not present

## 2023-10-09 DIAGNOSIS — Z8249 Family history of ischemic heart disease and other diseases of the circulatory system: Secondary | ICD-10-CM

## 2023-10-09 DIAGNOSIS — I083 Combined rheumatic disorders of mitral, aortic and tricuspid valves: Secondary | ICD-10-CM | POA: Diagnosis present

## 2023-10-09 DIAGNOSIS — Z87891 Personal history of nicotine dependence: Secondary | ICD-10-CM

## 2023-10-09 DIAGNOSIS — E871 Hypo-osmolality and hyponatremia: Secondary | ICD-10-CM | POA: Diagnosis not present

## 2023-10-09 DIAGNOSIS — J440 Chronic obstructive pulmonary disease with acute lower respiratory infection: Secondary | ICD-10-CM | POA: Diagnosis present

## 2023-10-09 DIAGNOSIS — Z66 Do not resuscitate: Secondary | ICD-10-CM | POA: Diagnosis not present

## 2023-10-09 DIAGNOSIS — J09X1 Influenza due to identified novel influenza A virus with pneumonia: Secondary | ICD-10-CM | POA: Diagnosis not present

## 2023-10-09 DIAGNOSIS — J1008 Influenza due to other identified influenza virus with other specified pneumonia: Secondary | ICD-10-CM | POA: Diagnosis not present

## 2023-10-09 DIAGNOSIS — E785 Hyperlipidemia, unspecified: Secondary | ICD-10-CM | POA: Diagnosis not present

## 2023-10-09 DIAGNOSIS — D509 Iron deficiency anemia, unspecified: Secondary | ICD-10-CM | POA: Diagnosis present

## 2023-10-09 DIAGNOSIS — N390 Urinary tract infection, site not specified: Secondary | ICD-10-CM | POA: Diagnosis present

## 2023-10-09 DIAGNOSIS — D72819 Decreased white blood cell count, unspecified: Secondary | ICD-10-CM | POA: Diagnosis not present

## 2023-10-09 DIAGNOSIS — J189 Pneumonia, unspecified organism: Secondary | ICD-10-CM | POA: Diagnosis present

## 2023-10-09 DIAGNOSIS — R69 Illness, unspecified: Secondary | ICD-10-CM | POA: Diagnosis not present

## 2023-10-09 DIAGNOSIS — E1122 Type 2 diabetes mellitus with diabetic chronic kidney disease: Secondary | ICD-10-CM | POA: Diagnosis present

## 2023-10-09 DIAGNOSIS — I2583 Coronary atherosclerosis due to lipid rich plaque: Secondary | ICD-10-CM | POA: Diagnosis not present

## 2023-10-09 DIAGNOSIS — R531 Weakness: Secondary | ICD-10-CM | POA: Diagnosis not present

## 2023-10-09 DIAGNOSIS — T380X5A Adverse effect of glucocorticoids and synthetic analogues, initial encounter: Secondary | ICD-10-CM | POA: Diagnosis not present

## 2023-10-09 DIAGNOSIS — Z885 Allergy status to narcotic agent status: Secondary | ICD-10-CM

## 2023-10-09 DIAGNOSIS — I350 Nonrheumatic aortic (valve) stenosis: Secondary | ICD-10-CM | POA: Diagnosis present

## 2023-10-09 DIAGNOSIS — N3090 Cystitis, unspecified without hematuria: Secondary | ICD-10-CM | POA: Diagnosis not present

## 2023-10-09 DIAGNOSIS — I5033 Acute on chronic diastolic (congestive) heart failure: Secondary | ICD-10-CM | POA: Diagnosis present

## 2023-10-09 DIAGNOSIS — I130 Hypertensive heart and chronic kidney disease with heart failure and stage 1 through stage 4 chronic kidney disease, or unspecified chronic kidney disease: Secondary | ICD-10-CM | POA: Diagnosis present

## 2023-10-09 DIAGNOSIS — D649 Anemia, unspecified: Secondary | ICD-10-CM | POA: Diagnosis present

## 2023-10-09 DIAGNOSIS — I5032 Chronic diastolic (congestive) heart failure: Secondary | ICD-10-CM | POA: Diagnosis present

## 2023-10-09 DIAGNOSIS — Z886 Allergy status to analgesic agent status: Secondary | ICD-10-CM

## 2023-10-09 DIAGNOSIS — Z79899 Other long term (current) drug therapy: Secondary | ICD-10-CM

## 2023-10-09 LAB — CBC WITH DIFFERENTIAL/PLATELET
Abs Immature Granulocytes: 0.01 10*3/uL (ref 0.00–0.07)
Basophils Absolute: 0 10*3/uL (ref 0.0–0.1)
Basophils Relative: 0 %
Eosinophils Absolute: 0.3 10*3/uL (ref 0.0–0.5)
Eosinophils Relative: 5 %
HCT: 27.3 % — ABNORMAL LOW (ref 36.0–46.0)
Hemoglobin: 8.3 g/dL — ABNORMAL LOW (ref 12.0–15.0)
Immature Granulocytes: 0 %
Lymphocytes Relative: 13 %
Lymphs Abs: 0.6 10*3/uL — ABNORMAL LOW (ref 0.7–4.0)
MCH: 32.8 pg (ref 26.0–34.0)
MCHC: 30.4 g/dL (ref 30.0–36.0)
MCV: 107.9 fL — ABNORMAL HIGH (ref 80.0–100.0)
Monocytes Absolute: 0.3 10*3/uL (ref 0.1–1.0)
Monocytes Relative: 6 %
Neutro Abs: 3.5 10*3/uL (ref 1.7–7.7)
Neutrophils Relative %: 76 %
Platelets: 179 10*3/uL (ref 150–400)
RBC: 2.53 MIL/uL — ABNORMAL LOW (ref 3.87–5.11)
RDW: 15 % (ref 11.5–15.5)
WBC: 4.7 10*3/uL (ref 4.0–10.5)
nRBC: 0 % (ref 0.0–0.2)

## 2023-10-09 LAB — GLUCOSE, CAPILLARY
Glucose-Capillary: 93 mg/dL (ref 70–99)
Glucose-Capillary: 150 mg/dL — ABNORMAL HIGH (ref 70–99)

## 2023-10-09 LAB — BASIC METABOLIC PANEL
Anion gap: 12 (ref 5–15)
BUN: 42 mg/dL — ABNORMAL HIGH (ref 8–23)
CO2: 25 mmol/L (ref 22–32)
Calcium: 8.8 mg/dL — ABNORMAL LOW (ref 8.9–10.3)
Chloride: 97 mmol/L — ABNORMAL LOW (ref 98–111)
Creatinine, Ser: 2.07 mg/dL — ABNORMAL HIGH (ref 0.44–1.00)
GFR, Estimated: 22 mL/min — ABNORMAL LOW (ref >60)
Glucose, Bld: 139 mg/dL — ABNORMAL HIGH (ref 70–99)
Potassium: 4.4 mmol/L (ref 3.5–5.1)
Sodium: 134 mmol/L — ABNORMAL LOW (ref 135–145)

## 2023-10-09 LAB — TROPONIN I (HIGH SENSITIVITY): Troponin I (High Sensitivity): 57 ng/L — ABNORMAL HIGH (ref <18)

## 2023-10-09 LAB — HEMOGLOBIN A1C
Hgb A1c MFr Bld: 5.8 % — ABNORMAL HIGH (ref 4.8–5.6)
Mean Plasma Glucose: 119.76 mg/dL

## 2023-10-09 LAB — PROCALCITONIN: Procalcitonin: <0.1 ng/mL

## 2023-10-09 LAB — CBG MONITORING, ED: Glucose-Capillary: 231 mg/dL — ABNORMAL HIGH (ref 70–99)

## 2023-10-09 MED ORDER — SODIUM CHLORIDE 0.9 % IV BOLUS
1000.0000 mL | Freq: Once | INTRAVENOUS | Status: AC
  Administered 2023-10-09: 1000 mL via INTRAVENOUS

## 2023-10-09 MED ORDER — PANTOPRAZOLE SODIUM 40 MG PO TBEC
40.0000 mg | DELAYED_RELEASE_TABLET | Freq: Every day | ORAL | Status: DC
  Administered 2023-10-09 – 2023-10-11 (×3): 40 mg via ORAL
  Filled 2023-10-09 (×3): qty 1

## 2023-10-09 MED ORDER — ROSUVASTATIN CALCIUM 10 MG PO TABS
5.0000 mg | ORAL_TABLET | Freq: Every day | ORAL | Status: DC
Start: 2023-10-09 — End: 2023-10-12
  Administered 2023-10-09 – 2023-10-11 (×3): 5 mg via ORAL
  Filled 2023-10-09 (×3): qty 1

## 2023-10-09 MED ORDER — TRAZODONE HCL 50 MG PO TABS
50.0000 mg | ORAL_TABLET | Freq: Every day | ORAL | Status: DC
  Administered 2023-10-09 – 2023-10-11 (×3): 50 mg via ORAL
  Filled 2023-10-09 (×3): qty 1

## 2023-10-09 MED ORDER — GUAIFENESIN-DM 100-10 MG/5ML PO SYRP
5.0000 mL | ORAL_SOLUTION | ORAL | Status: DC | PRN
  Administered 2023-10-09 – 2023-10-11 (×3): 5 mL via ORAL
  Filled 2023-10-09 (×3): qty 10

## 2023-10-09 MED ORDER — SODIUM CHLORIDE 0.9 % IV SOLN
1.0000 g | INTRAVENOUS | Status: DC
  Administered 2023-10-10: 1 g via INTRAVENOUS
  Filled 2023-10-09: qty 10

## 2023-10-09 MED ORDER — ACETAMINOPHEN 500 MG PO TABS
1000.0000 mg | ORAL_TABLET | Freq: Once | ORAL | Status: AC
  Administered 2023-10-09: 1000 mg via ORAL
  Filled 2023-10-09: qty 2

## 2023-10-09 MED ORDER — ASPIRIN 81 MG PO TBEC
81.0000 mg | DELAYED_RELEASE_TABLET | Freq: Every day | ORAL | Status: DC
  Administered 2023-10-10 – 2023-10-12 (×3): 81 mg via ORAL
  Filled 2023-10-09 (×3): qty 1

## 2023-10-09 MED ORDER — INSULIN ASPART 100 UNIT/ML IJ SOLN
0.0000 [IU] | Freq: Three times a day (TID) | INTRAMUSCULAR | Status: DC
  Administered 2023-10-09: 3 [IU] via SUBCUTANEOUS
  Administered 2023-10-10 (×2): 2 [IU] via SUBCUTANEOUS
  Administered 2023-10-11: 3 [IU] via SUBCUTANEOUS
  Administered 2023-10-11 – 2023-10-12 (×4): 5 [IU] via SUBCUTANEOUS
  Filled 2023-10-09 (×8): qty 1

## 2023-10-09 MED ORDER — ONDANSETRON HCL 4 MG/2ML IJ SOLN
4.0000 mg | Freq: Four times a day (QID) | INTRAMUSCULAR | Status: DC | PRN
Start: 2023-10-09 — End: 2023-10-12

## 2023-10-09 MED ORDER — ADULT MULTIVITAMIN W/MINERALS CH
1.0000 | ORAL_TABLET | Freq: Every day | ORAL | Status: DC
  Administered 2023-10-10 – 2023-10-12 (×3): 1 via ORAL
  Filled 2023-10-09 (×3): qty 1

## 2023-10-09 MED ORDER — ALLOPURINOL 100 MG PO TABS
50.0000 mg | ORAL_TABLET | Freq: Every day | ORAL | Status: DC
  Administered 2023-10-10 – 2023-10-12 (×3): 50 mg via ORAL
  Filled 2023-10-09 (×3): qty 1

## 2023-10-09 MED ORDER — GABAPENTIN 100 MG PO CAPS
100.0000 mg | ORAL_CAPSULE | Freq: Every day | ORAL | Status: DC
  Administered 2023-10-09 – 2023-10-11 (×3): 100 mg via ORAL
  Filled 2023-10-09 (×3): qty 1

## 2023-10-09 MED ORDER — OSELTAMIVIR PHOSPHATE 30 MG PO CAPS
30.0000 mg | ORAL_CAPSULE | Freq: Every day | ORAL | Status: DC
  Administered 2023-10-09 – 2023-10-12 (×4): 30 mg via ORAL
  Filled 2023-10-09 (×4): qty 1

## 2023-10-09 MED ORDER — ONDANSETRON HCL 4 MG PO TABS: 4.0000 mg | ORAL_TABLET | Freq: Four times a day (QID) | ORAL | Status: DC | PRN

## 2023-10-09 MED ORDER — ENOXAPARIN SODIUM 30 MG/0.3ML IJ SOSY: 30.0000 mg | PREFILLED_SYRINGE | INTRAMUSCULAR | Status: DC

## 2023-10-09 MED ORDER — SODIUM CHLORIDE 0.9 % IV SOLN: INTRAVENOUS | Status: DC

## 2023-10-09 MED ORDER — SODIUM CHLORIDE 0.9 % IV SOLN
1.0000 g | INTRAVENOUS | Status: AC
  Administered 2023-10-09: 1 g via INTRAVENOUS
  Filled 2023-10-09: qty 10

## 2023-10-09 NOTE — ED Provider Notes (Signed)
 [facility] Provider Note    Event Date/Time   First MD Initiated Contact with Patient 10/09/23 0900     (approximate)   History   Chief Complaint: Generalized weakness  HPI  Lisa Mcneil is a 89 y.o. female with a history of hypertension, CKD who comes ED due to shortness of breath and generalized weakness.  Symptoms have been gradually worsening for the past several days.  She was seen in the ED yesterday and diagnosed with influenza and UTI, felt okay for discharge yesterday but since then has continued worsening and is not able to function at home.          Physical Exam   Triage Vital Signs: ED Triage Vitals  Encounter Vitals Group     BP 10/09/23 0839 (!) 135/41     Systolic BP Percentile --      Diastolic BP Percentile --      Pulse Rate 10/09/23 0839 60     Resp 10/09/23 0839 18     Temp 10/09/23 0839 98.3 F (36.8 C)     Temp Source 10/09/23 0839 Oral     SpO2 10/09/23 0839 100 %     Weight --      Height --      Head Circumference --      Peak Flow --      Pain Score 10/09/23 0837 0     Pain Loc --      Pain Education --      Exclude from Growth Chart --     Most recent vital signs: Vitals:   10/09/23 1400 10/09/23 1616  BP: (!) 125/95 (!) 125/45  Pulse:  70  Resp: (!) 21 18  Temp:  98.7 F (37.1 C)  SpO2: 97% 92%    General: Awake, no distress.  CV:  Good peripheral perfusion.  Regular rate rhythm Resp:  Normal effort.  Mild basilar crackles Abd:  No distention.  Soft nontender Other:  Moist oral mucosa   ED Results / Procedures / Treatments   Labs (all labs ordered are listed, but only abnormal results are displayed) Labs Reviewed  BASIC METABOLIC PANEL - Abnormal; Notable for the following components:      Result Value   Sodium 134 (*)    Chloride 97 (*)    Glucose, Bld 139 (*)    BUN 42 (*)    Creatinine, Ser 2.07 (*)    Calcium 8.8 (*)    GFR, Estimated 22 (*)    All other components within  normal limits  CBC WITH DIFFERENTIAL/PLATELET - Abnormal; Notable for the following components:   RBC 2.53 (*)    Hemoglobin 8.3 (*)    HCT 27.3 (*)    MCV 107.9 (*)    Lymphs Abs 0.6 (*)    All other components within normal limits  CBG MONITORING, ED - Abnormal; Notable for the following components:   Glucose-Capillary 231 (*)    All other components within normal limits  TROPONIN I (HIGH SENSITIVITY) - Abnormal; Notable for the following components:   Troponin I (High Sensitivity) 57 (*)    All other components within normal limits  PROCALCITONIN  GLUCOSE, CAPILLARY  HEMOGLOBIN A1C     EKG    RADIOLOGY Chest x-ray interpreted by me, no consolidation.  Radiology report reviewed noting small basilar opacities   PROCEDURES:  Procedures   MEDICATIONS ORDERED IN ED: Medications  0.9 %  sodium chloride infusion ( Intravenous New Bag/Given  10/09/23 1535)  ondansetron (ZOFRAN) tablet 4 mg (has no administration in time range)    Or  ondansetron (ZOFRAN) injection 4 mg (has no administration in time range)  oseltamivir (TAMIFLU) capsule 30 mg (30 mg Oral Given 10/09/23 1242)  cefTRIAXone (ROCEPHIN) 1 g in sodium chloride 0.9 % 100 mL IVPB (has no administration in time range)  insulin aspart (novoLOG) injection 0-9 Units (3 Units Subcutaneous Given 10/09/23 1243)  guaiFENesin-dextromethorphan (ROBITUSSIN DM) 100-10 MG/5ML syrup 5 mL (5 mLs Oral Given 10/09/23 1302)  sodium chloride 0.9 % bolus 1,000 mL (0 mLs Intravenous Stopped 10/09/23 1535)  acetaminophen (TYLENOL) tablet 1,000 mg (1,000 mg Oral Given 10/09/23 0919)  cefTRIAXone (ROCEPHIN) 1 g in sodium chloride 0.9 % 100 mL IVPB (0 g Intravenous Stopped 10/09/23 1109)     IMPRESSION / MDM / ASSESSMENT AND PLAN / ED COURSE  I reviewed the triage vital signs and the nursing notes.  DDx: Influenza, myocarditis, electrolyte derangement, renal insufficiency  Patient's presentation is most consistent with acute presentation  with potential threat to life or bodily function.  Patient presents with worsening symptoms in the setting of recently diagnosed influenza and UTI.  Vital signs are okay, she is not septic.  Will give IV Rocephin, IV fluids, case discussed with hospitalist for further management       FINAL CLINICAL IMPRESSION(S) / ED DIAGNOSES   Final diagnoses:  Influenza A  Cystitis  Generalized weakness     Rx / DC Orders   ED Discharge Orders     None        Note:  This document was prepared using Dragon voice recognition software and may include unintentional dictation errors.   Sharman Cheek, MD 10/09/23 [phone]

## 2023-10-09 NOTE — Assessment & Plan Note (Addendum)
 Acute influenza A  Community acquired pneumonia, viral pneumonia can not rule out bacterial component.  COPD exacerbation.  Acute hypoxemic respiratory failure with increased 02 requirements and dyspnea on exertion.   Plan to continue antibiotic therapy with IV ceftriaxone and antiviral therapy with oseltamivir for 5 days.  Bronchodilator therapy and systemic corticosteroids.  Airway clearing techniques with flutter valve and incentive spirometer.  Antitussive agents, out of bed to the chair, PT and OT.   Acute hypoxemic respiratory failure, today 02 saturation 96% on 3 L/min per [place].  Continue oxymetry monitoring and supplemental 02 per [place] to keep 02 saturation 92% or greater.

## 2023-10-09 NOTE — Assessment & Plan Note (Addendum)
 Hyponatremia.   Renal function with serum cr at 1,72 with K at 4,1 and serum bicarbonate at 22  Na 138   Plan to resume diuresis, follow up renal function and electrolytes in am.  Avoid hypotension and nephrotoxic medications.

## 2023-10-09 NOTE — ED Notes (Signed)
 Bolus arrived clamped, IV flushed and bolus restarted. Awaiting pumps from materials to bring to unit in order to start maintenance IV fluids.

## 2023-10-09 NOTE — Assessment & Plan Note (Addendum)
 Marland Kitchen

## 2023-10-09 NOTE — Evaluation (Signed)
 Occupational Therapy Evaluation Patient Details Name: Lisa Mcneil MRN: [id number] DOB: 1927/09/30 Today's Date: 10/09/2023   History of Present Illness   Lisa Mcneil is a 89 y.o. female with medical history significant of HFpEF, type 2 diabetes, GERD, hypertension, CAD, stage III-IV CKD, presenting with influenza A, weakness, UTI.  Patient noted to have been seen yesterday in ER for influenza A as well as diagnosis of UTI.  Per patient, she has had worsening weakness and fatigue at home.  Positive decreased p.o. intake.  Positive nausea.     Clinical Impressions Pt admitted with above diagnosis. Pt received in recliner, pleasant and participatory. PTA, pt is independent with ADLs/IADLs/mobility including driving and working as a Theatre stage manager. Pt completes functional mobility t/f bathroom in hallway with supervision, unilateral support pushing IV pole, toileting tasks reg height toilet and ~20 ft in hallway. Pt presents with mild deficits in balance and activity tolerance. Pt would benefit from skilled OT services to address noted impairments and functional limitations (see below for any additional details) in order to maximize safety and independence while minimizing falls risk and caregiver burden. Anticipate the need for follow up [facility] OT services upon acute hospital DC.      If plan is discharge home, recommend the following:   Assist for transportation;Assistance with cooking/housework     Functional Status Assessment   Patient has had a recent decline in their functional status and demonstrates the ability to make significant improvements in function in a reasonable and predictable amount of time.     Equipment Recommendations   None recommended by OT      Precautions/Restrictions   Precautions Precautions: Fall Restrictions Weight Bearing Restrictions Per Provider Order: No     Mobility Bed Mobility Overal bed mobility: Independent                   Transfers Overall transfer level: Independent                        Balance Overall balance assessment: Mild deficits observed, not formally tested                                         ADL either performed or assessed with clinical judgement   ADL Overall ADL's : Independent;At baseline     Grooming: Wash/dry hands;Standing;Supervision/safety Grooming Details (indicate cue type and reason): steady in standing for grooming tasks                 Toilet Transfer: Supervision/safety;Ambulation;Regular Teacher, adult education Details (indicate cue type and reason): pt performs STS transfer on reg height commode, no LOB, no use of AD or grab bars for UE support Toileting- Clothing Manipulation and Hygiene: Supervision/safety;Sitting/lateral lean       Functional mobility during ADLs: Supervision/safety General ADL Comments: Pt ambulates t/f bathroom pushing IV pole, SUPERVISION level     Vision Baseline Vision/History: 1 Wears glasses              Pertinent Vitals/Pain Pain Assessment Pain Assessment: No/denies pain     Extremity/Trunk Assessment Upper Extremity Assessment Upper Extremity Assessment: Overall WFL for tasks assessed   Lower Extremity Assessment Lower Extremity Assessment: Generalized weakness   Cervical / Trunk Assessment Cervical / Trunk Assessment: Normal   Communication Communication Communication: No apparent difficulties   Cognition Arousal: Alert Behavior During Therapy: WFL for tasks  assessed/performed                                 Following commands: Intact       Cueing  General Comments   Cueing Techniques: Verbal cues      Exercises Other Exercises Other Exercises: Edu on role of OT in acute setting        Home Living Family/patient expects to be discharged to:: Private residence Living Arrangements: Alone Available Help at Discharge: Available PRN/intermittently Type  of Home: Apartment Home Access: Stairs to enter Entrance Stairs-Number of Steps: 6 Entrance Stairs-Rails: Left Home Layout: One level     Bathroom Shower/Tub: Chief Strategy Officer: Standard Bathroom Accessibility: No   Home Equipment: Grab bars - toilet;Grab bars - tub/shower          Prior Functioning/Environment Prior Level of Function : Independent/Modified Independent;Working/employed;Driving             Mobility Comments: works part time, still driving ADLs Comments: independent    OT Problem List: Decreased strength;Decreased activity tolerance   OT Treatment/Interventions: Self-care/ADL training;Energy conservation;Therapeutic activities;Patient/family education;Balance training      OT Goals(Current goals can be found in the care plan section)   Acute Rehab OT Goals OT Goal Formulation: With patient Time For Goal Achievement: 10/23/23 Potential to Achieve Goals: Good   OT Frequency:  Min 1X/week       AM-PAC OT "6 Clicks" Daily Activity     Outcome Measure Help from another person eating meals?: None Help from another person taking care of personal grooming?: None Help from another person toileting, which includes using toliet, bedpan, or urinal?: A Little Help from another person bathing (including washing, rinsing, drying)?: A Little Help from another person to put on and taking off regular upper body clothing?: None Help from another person to put on and taking off regular lower body clothing?: None 6 Click Score: 22   End of Session Nurse Communication: Mobility status  Activity Tolerance: Patient tolerated treatment well Patient left: in chair;with call bell/phone within reach;with nursing/sitter in room  OT Visit Diagnosis: Muscle weakness (generalized) (M62.81);Other abnormalities of gait and mobility (R26.89)                Time: 1610-9604 OT Time Calculation (min): 16 min Charges:  OT General Charges $OT Visit: 1  Visit OT Evaluation $OT Eval Low Complexity: 1 Low  Keira Bohlin L. Mayer Vondrak, OTR/L  10/09/23, 4:49 PM

## 2023-10-09 NOTE — H&P (Addendum)
 History and Physical    [name] EPP:[id number] DOB: [birth year] DOA: 10/09/2023 DOS: the patient was seen and examined on 10/09/2023 PCP: Marisue Ivan, MD  Patient coming from: Home  Chief Complaint: No chief complaint on file.  HPI: Lisa Mcneil is a 89 y.o. female with medical history significant of HFpEF, type 2 diabetes, GERD, hypertension, CAD, stage III-IV CKD, presenting with influenza A, weakness, UTI.  Patient noted to have been seen yesterday in ER for influenza A as well as diagnosis of UTI.  Per patient, she has had worsening weakness and fatigue at home.  Positive decreased p.o. intake.  Positive nausea. No chest pain . No focal hemiparesis or confusion. No orthopnea or PND. Minimal dysuria. No reported abdominal pain.  Presented to the ER afebrile, hemodynamically stable.  White count 4.7, hemoglobin 8.3, platelets 179, creatinine 2.07.  Chest x-ray pending. Review of Systems: As mentioned in the history of present illness. All other systems reviewed and are negative. Past Medical History:  Diagnosis Date   CHF (congestive heart failure) (HCC)    Diabetes mellitus without complication (HCC)    GERD (gastroesophageal reflux disease)    Hypertension    Insomnia    Iron deficiency anemia    RLS (restless legs syndrome)    Past Surgical History:  Procedure Laterality Date   BLADDER REPAIR     CATARACT EXTRACTION W/PHACO Right 05/13/2016   Procedure: CATARACT EXTRACTION PHACO AND INTRAOCULAR LENS PLACEMENT (IOC);  Surgeon: Galen Manila, MD;  Location: [facility] ORS;  Service: Ophthalmology;  Laterality: Right;  [place] 1.09 AP% 21.0 CDE 14.60 Fluid Pack Lot # [id number]   CATARACT EXTRACTION W/PHACO Left 07/15/2016   Procedure: CATARACT EXTRACTION PHACO AND INTRAOCULAR LENS PLACEMENT (IOC);  Surgeon: Galen Manila, MD;  Location: [facility] ORS;  Service: Ophthalmology;  Laterality: Left;  Lot# [id number] H [place]: 01:17.9 AP%:27.8 CDE: 21.67     ESOPHAGOGASTRODUODENOSCOPY (EGD) WITH PROPOFOL N/A 03/26/2023   Procedure: ESOPHAGOGASTRODUODENOSCOPY (EGD) WITH PROPOFOL;  Surgeon: Midge Minium, MD;  Location: [facility] ENDOSCOPY;  Service: Endoscopy;  Laterality: N/A;   HEMOSTASIS CLIP PLACEMENT  03/26/2023   Procedure: HEMOSTASIS CLIP PLACEMENT;  Surgeon: Midge Minium, MD;  Location: [facility] ENDOSCOPY;  Service: Endoscopy;;   HEMOSTASIS CONTROL  03/26/2023   Procedure: HEMOSTASIS CONTROL;  Surgeon: Midge Minium, MD;  Location: [facility] ENDOSCOPY;  Service: Endoscopy;;   HOT HEMOSTASIS  03/26/2023   Procedure: HOT HEMOSTASIS (ARGON PLASMA COAGULATION/BICAP);  Surgeon: Midge Minium, MD;  Location: [facility] ENDOSCOPY;  Service: Endoscopy;;   Social History:  reports that she has quit smoking. She has never used smokeless tobacco. She reports that she does not drink alcohol and does not use drugs.  Allergies  Allergen Reactions   Atorvastatin Other (See Comments)   Codeine    Demerol [Meperidine]    Naproxen Rash    Caused rash on mouth    Family History  Problem Relation Age of Onset   Hypertension Father    Heart disease Father     Prior to Admission medications   Medication Sig Start Date End Date Taking? Authorizing Provider  allopurinol (ZYLOPRIM) 100 MG tablet Take 0.5 tablets (50 mg total) by mouth daily. 09/19/22  Yes Alford Highland, MD  carvedilol (COREG) 3.125 MG tablet Take 1 tablet (3.125 mg total) by mouth 2 (two) times daily with a meal. 09/16/22  Yes Wieting, Richard, MD  ferrous sulfate 325 (65 FE) MG tablet Take 325 mg by mouth daily.   Yes [provider]  gabapentin (NEURONTIN) 100 MG capsule  Take 100 mg by mouth at bedtime.   Yes [provider]  isosorbide mononitrate (IMDUR) 60 MG 24 hr tablet Take by mouth daily. 07/22/23  Yes [provider]  Melatonin 10 MG TABS Take 20 mg by mouth at bedtime.   Yes [provider]  omeprazole (PRILOSEC) 20 MG capsule Take 20 mg by mouth daily.   Yes [provider]  pramipexole (MIRAPEX) 0.25 MG tablet Take 0.25 mg by mouth in the morning and at bedtime. 06/16/22  Yes [provider]  rosuvastatin (CRESTOR) 5 MG tablet Take 5 mg by mouth at bedtime.   Yes [provider]  traZODone (DESYREL) 50 MG tablet Take 50 mg by mouth at bedtime.   Yes [provider]  aspirin EC 81 MG tablet Take 1 tablet (81 mg total) by mouth daily. Swallow whole. 06/15/22   Arnetha Courser, MD  cephALEXin (KEFLEX) 500 MG capsule Take 1 capsule (500 mg total) by mouth 2 (two) times daily. Patient not taking: Reported on 10/09/2023 10/08/23   Sharman Cheek, MD  furosemide (LASIX) 40 MG tablet Take 1 tablet (40 mg total) by mouth 2 (two) times daily. 09/16/22 03/22/23  Alford Highland, MD  Multiple Vitamins-Minerals (MULTIVITAMIN WITH MINERALS) tablet Take 1 tablet by mouth daily.    [provider]  neomycin-polymyxin b-dexamethasone (MAXITROL) 3.5-10000-0.1 SUSP Place 1 drop into the right eye 3 (three) times daily. Patient not taking: Reported on 10/09/2023 03/11/23   [provider]  nitroGLYCERIN (NITROSTAT) 0.4 MG SL tablet Place 1 tablet (0.4 mg total) under the tongue every 5 (five) minutes as needed for chest pain. Patient not taking: Reported on 10/09/2023 06/15/22   Arnetha Courser, MD    Physical Exam: Vitals:   10/09/23 0839 10/09/23 1033  BP: (!) 135/41   Pulse: 60   Resp: 18   Temp: 98.3 F (36.8 C)   TempSrc: Oral   SpO2: 100%   Height:  5\' 1"  (1.549 m)   Physical Exam Constitutional:      Appearance: She is normal weight.  HENT:     Head: Normocephalic and atraumatic.     Nose: Nose normal.     Mouth/Throat:     Mouth: Mucous membranes are dry.  Eyes:     Pupils: Pupils are equal, round, and reactive to light.  Cardiovascular:     Rate and Rhythm: Normal rate and regular rhythm.  Pulmonary:     Effort: Pulmonary effort is normal.  Abdominal:     General: Bowel sounds are normal.   Musculoskeletal:        General: Normal range of motion.     Cervical back: Normal range of motion.  Skin:    General: Skin is dry.  Neurological:     General: No focal deficit present.  Psychiatric:        Mood and Affect: Mood normal.     Data Reviewed:  There are no new results to review at this time.  DG Chest Portable 1 View CLINICAL DATA:  Shortness of breath and weakness. New diagnosis of influenza  EXAM: PORTABLE CHEST 1 VIEW  COMPARISON:  Chest radiograph dated 10/08/2023  FINDINGS: Normal lung volumes. Slightly increased bilateral lower lung interstitial and patchy opacities. No pleural effusion or pneumothorax. Similar enlarged cardiomediastinal silhouette. No acute osseous abnormality.  IMPRESSION: Slightly increased bilateral lower lung interstitial and patchy opacities, which may represent atypical infection or pulmonary edema.  Electronically Signed   By: Milus Height.D.  On: 10/09/2023 11:22  Lab Results  Component Value Date   WBC 4.7 10/09/2023   HGB 8.3 (L) 10/09/2023   HCT 27.3 (L) 10/09/2023   MCV 107.9 (H) 10/09/2023   PLT 179 10/09/2023   Last metabolic panel Lab Results  Component Value Date   GLUCOSE 139 (H) 10/09/2023   NA 134 (L) 10/09/2023   K 4.4 10/09/2023   CL 97 (L) 10/09/2023   CO2 25 10/09/2023   BUN 42 (H) 10/09/2023   CREATININE 2.07 (H) 10/09/2023   GFRNONAA 22 (L) 10/09/2023   CALCIUM 8.8 (L) 10/09/2023   PROT 6.8 10/08/2023   ALBUMIN 3.4 (L) 10/08/2023   BILITOT 0.7 10/08/2023   ALKPHOS 67 10/08/2023   AST 18 10/08/2023   ALT 14 10/08/2023   ANIONGAP 12 10/09/2023    Assessment and Plan: * Influenza A Weakness  Progressive worsening fatigue and malaise in setting of influenza  + worsening decreased po intake CXR negative for focal infiltrate- no hypoxia  Tamiflu  Symptomatic management  PT/OT evaluation  Monitor   CAD (coronary artery disease) No active CP  EKG NSR  Monitor   UTI (urinary  tract infection) Recent diagnosis by UA and symptoms  Unable to tolerate po  IV rocephin  Follow urine culture    Anemia Noted history of iron deficiency anemia and GI bleeding Hgb 8.3  No reported black/tarry stools  Trend hgb  Follow    Essential hypertension BP stable  Titrate home regimen    Type II diabetes mellitus with renal manifestations (HCC) Blood sugar in 130s  SSI  Monitor    CKD (chronic kidney disease) stage 4, GFR 15-29 ml/min (HCC) Cr 2 w/ GFR in the 20s  Appears near baseline  Monitor    GERD without esophagitis PPI    Mixed hyperlipidemia Cont statin   Chronic heart failure with preserved ejection fraction (HFpEF) (HCC) 2D ECHO 09/2022 w/ EF 60-65% and grade 2 DD Clinically dry on presentation in setting of influenza A  IVF hydration  Monitor volume status closely       Advance Care Planning:   Code Status: Limited: Do not attempt resuscitation (DNR) -DNR-LIMITED -Do Not Intubate/DNI    Consults: None at present   Family Communication: No family at the bedside   Severity of Illness: The appropriate patient status for this patient is INPATIENT. Inpatient status is judged to be reasonable and necessary in order to provide the required intensity of service to ensure the patient's safety. The patient's presenting symptoms, physical exam findings, and initial radiographic and laboratory data in the context of their chronic comorbidities is felt to place them at high risk for further clinical deterioration. Furthermore, it is not anticipated that the patient will be medically stable for discharge from the hospital within 2 midnights of admission.   * I certify that at the point of admission it is my clinical judgment that the patient will require inpatient hospital care spanning beyond 2 midnights from the point of admission due to high intensity of service, high risk for further deterioration and high frequency of surveillance  required.*  Author: Floydene Flock, MD 10/09/2023 11:00 AM  For on call review [email or web address].

## 2023-10-09 NOTE — Evaluation (Signed)
 Physical Therapy Evaluation Patient Details Name: Lisa Mcneil MRN: [id number] DOB: [birth year] Today's Date: 10/09/2023  History of Present Illness  Lisa Mcneil is a 89 y.o. female with medical history significant of HFpEF, type 2 diabetes, GERD, hypertension, CAD, stage III-IV CKD, presenting with influenza A, weakness, UTI.  Patient noted to have been seen yesterday in ER for influenza A as well as diagnosis of UTI.  Per patient, she has had worsening weakness and fatigue at home.  Positive decreased p.o. intake.  Positive nausea.   Clinical Impression  Pt admitted with above diagnosis. Pt currently with functional limitations due to the deficits listed below (see PT Problem List). Pt received supine in bed agreeable to PT. Reports PTA pt being independent with gait and ADL's/IADL's still working part time and driving.   To date, pt is independent with bed mobility, donning shoes in sitting, and standing. Supervision for gait pushing IV pole ~110'. Reports normal gait cadence and quality despite fatigue and weakness. Pt does have x1 mild LOB requiring CGA and stepping strategy to correct. Pt ambulating at a gait cadence of 1.66'/sec indicative of short community walking distances. Returned to recliner with all needs in reach. Pt with mild deficits in balance and limited endurance for short and normal community walking distances thus pt would benefit from PT f/u to maximize return to PLOF.       If plan is discharge home, recommend the following: A little help with walking and/or transfers;Assist for transportation;Help with stairs or ramp for entrance   Can travel by private vehicle        Equipment Recommendations None recommended by PT  Recommendations for Other Services       Functional Status Assessment Patient has had a recent decline in their functional status and demonstrates the ability to make significant improvements in function in a reasonable and predictable  amount of time.     Precautions / Restrictions Precautions Precautions: Fall Precaution/Restrictions Comments: mild falls risk Restrictions Weight Bearing Restrictions Per Provider Order: No      Mobility  Bed Mobility Overal bed mobility: Independent               Patient Response: Cooperative  Transfers Overall transfer level: Independent                      Ambulation/Gait Ambulation/Gait assistance: Supervision Gait Distance (Feet): 110 Feet Assistive device: IV Pole Gait Pattern/deviations: Step-through pattern, Decreased step length - right, Decreased step length - left Gait velocity: 1.66'/sec Gait velocity interpretation: 1.31 - 2.62 ft/sec, indicative of limited community ambulator   General Gait Details: mild UE use on IV pole to self propel. x1 minor LOB requiring stepping strategy and PT CGA to correct.  Stairs            Wheelchair Mobility     Tilt Bed Tilt Bed Patient Response: Cooperative  Modified Rankin (Stroke Patients Only)       Balance Overall balance assessment: Mild deficits observed, not formally tested                                           Pertinent Vitals/Pain Pain Assessment Pain Assessment: No/denies pain    Home Living Family/patient expects to be discharged to:: Private residence Living Arrangements: Alone Available Help at Discharge: Available PRN/intermittently Type of Home: Apartment Home Access: Stairs to enter  Entrance Stairs-Rails: Left Entrance Stairs-Number of Steps: 6   Home Layout: One level        Prior Function Prior Level of Function : Independent/Modified Independent;Working/employed             Mobility Comments: works part time, still driving ADLs Comments: independent     Extremity/Trunk Assessment   Upper Extremity Assessment Upper Extremity Assessment: Overall WFL for tasks assessed    Lower Extremity Assessment Lower Extremity Assessment:  Generalized weakness    Cervical / Trunk Assessment Cervical / Trunk Assessment: Normal  Communication   Communication Communication: No apparent difficulties    Cognition Arousal: Alert Behavior During Therapy: WFL for tasks assessed/performed   PT - Cognitive impairments: No apparent impairments                         Following commands: Intact       Cueing Cueing Techniques: Verbal cues     General Comments General comments (skin integrity, edema, etc.): VSS. SPo2 > 90% with gait when good pleth waveform recorded.    Exercises Other Exercises Other Exercises: Role of PT in acute setting   Assessment/Plan    PT Assessment Patient needs continued PT services  PT Problem List Decreased strength;Decreased activity tolerance;Decreased balance;Decreased mobility       PT Treatment Interventions Neuromuscular re-education;Gait training;Stair training;Patient/family education;Functional mobility training;Therapeutic activities;Therapeutic exercise;Balance training    PT Goals (Current goals can be found in the Care Plan section)  Acute Rehab PT Goals Patient Stated Goal: to go home PT Goal Formulation: With patient Time For Goal Achievement: 10/23/23 Potential to Achieve Goals: Good    Frequency Min 1X/week     Co-evaluation               AM-PAC PT "6 Clicks" Mobility  Outcome Measure Help needed turning from your back to your side while in a flat bed without using bedrails?: None Help needed moving from lying on your back to sitting on the side of a flat bed without using bedrails?: None Help needed moving to and from a bed to a chair (including a wheelchair)?: None Help needed standing up from a chair using your arms (e.g., wheelchair or bedside chair)?: A Little Help needed to walk in hospital room?: A Little Help needed climbing 3-5 steps with a railing? : A Little 6 Click Score: 21    End of Session Equipment Utilized During Treatment:  Gait belt Activity Tolerance: Patient tolerated treatment well Patient left: in chair;with call bell/phone within reach   PT Visit Diagnosis: Unsteadiness on feet (R26.81);Muscle weakness (generalized) (M62.81)    Time: 9147-8295 PT Time Calculation (min) (ACUTE ONLY): 19 min   Charges:   PT Evaluation $PT Eval Low Complexity: 1 Low   PT General Charges $$ ACUTE PT VISIT: 1 Visit        Delphia Grates. Fairly IV, PT, DPT Physical Therapist- [place]  [facility]  10/09/2023, 3:15 PM

## 2023-10-09 NOTE — Assessment & Plan Note (Addendum)
 Continue with pantoprazole/.

## 2023-10-09 NOTE — Assessment & Plan Note (Addendum)
 Hyperglycemia uncontrolled T2DM, steroid induced,  Continue glucose cover and monitoring with insulin sliding scale.  Will add low dose basal insulin and continue close monitoring.   Continue statin therapy.

## 2023-10-09 NOTE — ED Triage Notes (Signed)
 Pt BIB [facility] for SOB/Weakness from home. Pt was dx with the flu yesterday.

## 2023-10-09 NOTE — Assessment & Plan Note (Addendum)
 10/08/23 urine culture positive for cirtrobacter. (Present on admission)  Sensitive to cephalosporins  Continue antibiotic therapy with IV ceftriaxone.

## 2023-10-09 NOTE — Assessment & Plan Note (Deleted)
 Cont statin

## 2023-10-09 NOTE — Assessment & Plan Note (Deleted)
 2D ECHO 09/2022 w/ EF 60-65% and grade 2 DD Clinically dry on presentation in setting of influenza A  IVF hydration  Monitor volume status closely

## 2023-10-09 NOTE — Progress Notes (Signed)
 CXR w/ ? Infiltrate in setting of influenza A  No hypoxia at present  WBC 4.7  Will check CT chest to better correlate given age and comorbidities.

## 2023-10-09 NOTE — Assessment & Plan Note (Addendum)
 No active chest pain, resume isosorbide.  Mild elevation in high sensitive troponin due to heart failure exacerbation, ruled out acute coronary syndrome.

## 2023-10-09 NOTE — Assessment & Plan Note (Addendum)
 Resume carvedilol and isosorbide, continue blood pressure monitoring.

## 2023-10-10 ENCOUNTER — Encounter: Payer: Self-pay | Admitting: Oncology

## 2023-10-10 DIAGNOSIS — I5033 Acute on chronic diastolic (congestive) heart failure: Secondary | ICD-10-CM

## 2023-10-10 DIAGNOSIS — N184 Chronic kidney disease, stage 4 (severe): Secondary | ICD-10-CM | POA: Diagnosis not present

## 2023-10-10 DIAGNOSIS — E611 Iron deficiency: Secondary | ICD-10-CM

## 2023-10-10 DIAGNOSIS — E785 Hyperlipidemia, unspecified: Secondary | ICD-10-CM

## 2023-10-10 DIAGNOSIS — J189 Pneumonia, unspecified organism: Secondary | ICD-10-CM

## 2023-10-10 DIAGNOSIS — I10 Essential (primary) hypertension: Secondary | ICD-10-CM

## 2023-10-10 DIAGNOSIS — I2583 Coronary atherosclerosis due to lipid rich plaque: Secondary | ICD-10-CM

## 2023-10-10 DIAGNOSIS — K219 Gastro-esophageal reflux disease without esophagitis: Secondary | ICD-10-CM

## 2023-10-10 DIAGNOSIS — N390 Urinary tract infection, site not specified: Secondary | ICD-10-CM | POA: Diagnosis not present

## 2023-10-10 LAB — GLUCOSE, CAPILLARY
Glucose-Capillary: 103 mg/dL — ABNORMAL HIGH (ref 70–99)
Glucose-Capillary: 173 mg/dL — ABNORMAL HIGH (ref 70–99)
Glucose-Capillary: 200 mg/dL — ABNORMAL HIGH (ref 70–99)
Glucose-Capillary: 266 mg/dL — ABNORMAL HIGH (ref 70–99)

## 2023-10-10 LAB — CBC
HCT: 23 % — ABNORMAL LOW (ref 36.0–46.0)
Hemoglobin: 7.2 g/dL — ABNORMAL LOW (ref 12.0–15.0)
MCH: 32.7 pg (ref 26.0–34.0)
MCHC: 31.3 g/dL (ref 30.0–36.0)
MCV: 104.5 fL — ABNORMAL HIGH (ref 80.0–100.0)
Platelets: 152 10*3/uL (ref 150–400)
RBC: 2.2 MIL/uL — ABNORMAL LOW (ref 3.87–5.11)
RDW: 14.8 % (ref 11.5–15.5)
WBC: 3.2 10*3/uL — ABNORMAL LOW (ref 4.0–10.5)
nRBC: 0 % (ref 0.0–0.2)

## 2023-10-10 LAB — COMPREHENSIVE METABOLIC PANEL
ALT: 11 U/L (ref 0–44)
AST: 14 U/L — ABNORMAL LOW (ref 15–41)
Albumin: 2.9 g/dL — ABNORMAL LOW (ref 3.5–5.0)
Alkaline Phosphatase: 57 U/L (ref 38–126)
Anion gap: 8 (ref 5–15)
BUN: 35 mg/dL — ABNORMAL HIGH (ref 8–23)
CO2: 22 mmol/L (ref 22–32)
Calcium: 8.6 mg/dL — ABNORMAL LOW (ref 8.9–10.3)
Chloride: 108 mmol/L (ref 98–111)
Creatinine, Ser: 1.72 mg/dL — ABNORMAL HIGH (ref 0.44–1.00)
GFR, Estimated: 27 mL/min — ABNORMAL LOW (ref >60)
Glucose, Bld: 125 mg/dL — ABNORMAL HIGH (ref 70–99)
Potassium: 4.1 mmol/L (ref 3.5–5.1)
Sodium: 138 mmol/L (ref 135–145)
Total Bilirubin: 0.5 mg/dL (ref 0.0–1.2)
Total Protein: 5.8 g/dL — ABNORMAL LOW (ref 6.5–8.1)

## 2023-10-10 LAB — URINE CULTURE: Culture: >=100000 — AB

## 2023-10-10 MED ORDER — ISOSORBIDE MONONITRATE ER 30 MG PO TB24
60.0000 mg | ORAL_TABLET | Freq: Every day | ORAL | Status: DC
  Administered 2023-10-10 – 2023-10-12 (×3): 60 mg via ORAL
  Filled 2023-10-10 (×3): qty 2

## 2023-10-10 MED ORDER — METHYLPREDNISOLONE SODIUM SUCC 40 MG IJ SOLR
40.0000 mg | INTRAMUSCULAR | Status: DC
  Administered 2023-10-10 – 2023-10-12 (×3): 40 mg via INTRAVENOUS
  Filled 2023-10-10 (×3): qty 1

## 2023-10-10 MED ORDER — IPRATROPIUM-ALBUTEROL 0.5-2.5 (3) MG/3ML IN SOLN
3.0000 mL | Freq: Four times a day (QID) | RESPIRATORY_TRACT | Status: DC
  Administered 2023-10-10: 3 mL via RESPIRATORY_TRACT
  Filled 2023-10-10: qty 3

## 2023-10-10 MED ORDER — PRAMIPEXOLE DIHYDROCHLORIDE 0.25 MG PO TABS
0.2500 mg | ORAL_TABLET | Freq: Two times a day (BID) | ORAL | Status: DC
  Administered 2023-10-10 – 2023-10-12 (×5): 0.25 mg via ORAL
  Filled 2023-10-10 (×6): qty 1

## 2023-10-10 MED ORDER — FERROUS SULFATE 325 (65 FE) MG PO TABS
325.0000 mg | ORAL_TABLET | Freq: Every day | ORAL | Status: DC
  Administered 2023-10-10 – 2023-10-12 (×3): 325 mg via ORAL
  Filled 2023-10-10 (×3): qty 1

## 2023-10-10 MED ORDER — CARVEDILOL 3.125 MG PO TABS
3.1250 mg | ORAL_TABLET | Freq: Two times a day (BID) | ORAL | Status: DC
  Administered 2023-10-10 – 2023-10-12 (×5): 3.125 mg via ORAL
  Filled 2023-10-10 (×5): qty 1

## 2023-10-10 MED ORDER — FUROSEMIDE 10 MG/ML IJ SOLN
60.0000 mg | Freq: Once | INTRAMUSCULAR | Status: AC
  Administered 2023-10-10: 60 mg via INTRAVENOUS
  Filled 2023-10-10: qty 6

## 2023-10-10 MED ORDER — FUROSEMIDE 40 MG PO TABS
40.0000 mg | ORAL_TABLET | Freq: Every day | ORAL | Status: DC
  Administered 2023-10-11 – 2023-10-12 (×2): 40 mg via ORAL
  Filled 2023-10-10 (×2): qty 1

## 2023-10-10 MED ORDER — IPRATROPIUM-ALBUTEROL 0.5-2.5 (3) MG/3ML IN SOLN: 3.0000 mL | RESPIRATORY_TRACT | Status: DC | PRN

## 2023-10-10 MED ORDER — SODIUM CHLORIDE 0.9 % IV SOLN
1.0000 g | INTRAVENOUS | Status: DC
  Administered 2023-10-11: 1 g via INTRAVENOUS
  Filled 2023-10-10: qty 10

## 2023-10-10 MED ORDER — MELATONIN 5 MG PO TABS
20.0000 mg | ORAL_TABLET | Freq: Every day | ORAL | Status: DC
  Administered 2023-10-10 – 2023-10-11 (×2): 20 mg via ORAL
  Filled 2023-10-10 (×2): qty 4

## 2023-10-10 NOTE — Progress Notes (Addendum)
 Progress Note   [name]:[id number] DOB: Apr 16, 1928 DOA: 10/09/2023     1 DOS: the patient was seen and examined on 10/10/2023   Brief hospital course: Lisa Mcneil was admitted to the hospital with the working diagnosis of acute influenza A infection, complicated with viral pneumonia.   89 yo female with the past medical history of heart failure, T2DM, GERD,  hypertension, and coronary artery disease, who presented with worsening generalized weakness and fatigue. Significant decreased in po intake at home. On her initial physical examination her blood pressure was 135/41, HR 60, RR 18 and 02 saturation 100%, dry mucous membranes, lungs with no wheezing or rhonchi, heart with S1 and S2 present and regular, abdomen with no distention and no lower extremity edema.    Na 134, K 4,4 CL 97 bicarbonate 25, glucose 135, bun 42. Cr 2,0  High sensitive troponin 57   Wbc 4.7 hgb 8,3 plt 179  Sars covid 19 negative Influenza A negative   Urine analysis SG 1,008, negative protein, large leukocytes, negative hgb. Wbc > 50   Chest radiograph with no cardiomegaly, positive interstitial infiltrate at the right lower lobe with left base atelectasis.   CT chest positive centrilobular emphysema, predominant at apical zones, minimal ground glass opacities at the left greater than right lung base. No cardiomegaly.   Assessment and Plan: * Right lower lobe pneumonia Acute influenza A  Community acquired pneumonia, viral pneumonia can not rule out bacterial component.  COPD exacerbation.  Acute hypoxemic respiratory failure with increased 02 requirements and dyspnea on exertion.   Plan to continue antibiotic therapy with IV ceftriaxone and antiviral therapy with oseltamivir for 5 days.  Bronchodilator therapy and systemic corticosteroids.  Airway clearing techniques with flutter valve and incentive spirometer.  Antitussive agents, out of bed to the chair, PT and OT.   Acute  hypoxemic respiratory failure, today 02 saturation 96% on 3 L/min per [place].  Continue oxymetry monitoring and supplemental 02 per [place] to keep 02 saturation 92% or greater.   UTI (urinary tract infection) 10/08/23 urine culture positive for cirtrobacter. (Present on admission)  Continue antibiotic therapy with IV ceftriaxone, follow up on cultures and cell count.  Acute on chronic diastolic CHF (congestive heart failure) (HCC) 2024 Echocardiogram with preserved LV systolic function with EF 60 to 65%, severe LVH, RV with moderate reduction in systolic function, moderate RV enlargement, moderate RV wall thickness, mild elevated RVSP, LA with severe dilatation, RA with moderate dilatation, moderate TR, mild MR,  severe aortic stenosis,    Today patient with signs of volume overload and likely acute cardiogenic pulmonary edema.   Plan to resume diuretic therapy with furosemide, will give one dose today 60 mg IV and then continue with 40 mg po daily.  Discontinue IV fluids.  Resume carvedilol and isosorbide.   CKD (chronic kidney disease) stage 4, GFR 15-29 ml/min (HCC) Hyponatremia.   Renal function with serum cr at 1,72 with K at 4,1 and serum bicarbonate at 22  Na 138   Plan to resume diuresis, follow up renal function and electrolytes in am.  Avoid hypotension and nephrotoxic medications.   Essential hypertension Resume carvedilol and isosorbide, continue blood pressure monitoring.    CAD (coronary artery disease) No active chest pain, resume isosorbide.  Mild elevation in high sensitive troponin due to heart failure exacerbation, ruled out acute coronary syndrome.   Type 2 diabetes mellitus with hyperlipidemia (HCC) Continue glucose cover and monitoring with insulin sliding scale.  Fasting glucose  this am 125 mg/dl.   GERD without esophagitis Continue with pantoprazole.    Iron deficiency Reactive leukopenia.  Chronic anemia, with hgb today at 7,2 and plt at 152 Hold on PRBC  transfusion for now.  Follow up cell count in am.  Recent iron panel from 09/2023 with serum iron at 282, TIBC 356, transferrin saturation 79 and ferritin 25          Subjective: Patient continue to have dyspnea, worse with exertion, this morning required supplemental 02 per [place]. She was not able to sleep last night   Physical Exam: Vitals:   10/09/23 2048 10/10/23 0336 10/10/23 0738 10/10/23 0739  BP: (!) 116/49 (!) 132/53 138/66   Pulse: 64 81 67 62  Resp: 18 16 16    Temp: 98.3 F (36.8 C) 98.2 F (36.8 C) 97.7 F (36.5 C)   TempSrc: Oral     SpO2: 91% 90% 92% 96%  Height:       Neurology awake and alert ENT with mild pallor Cardiovascular with S1 and S2 present and regular, positive systolic murmur at the base with no gallops or rubs No JVD No lower extremity edema Respiratory with prolonged expiratory phase with no wheezing. Scattered rales with no rhonchi  Abdomen with no distention  Data Reviewed:    Family Communication: I spoke with patient's son over the phone at the bedside, we talked in detail about patient's condition, plan of care and prognosis and all questions were addressed.   Disposition: Status is: Inpatient Remains inpatient appropriate because: IV medications and supplemental 02   Planned Discharge Destination: Home    Author: Coralie Keens, MD 10/10/2023 10:54 AM  For on call review [email or web address].

## 2023-10-10 NOTE — Plan of Care (Signed)

## 2023-10-10 NOTE — Assessment & Plan Note (Addendum)
 Reactive leukopenia.  Chronic anemia, with hgb today at 7,5.  Hold on PRBC transfusion for now.  Follow up cell count in am.  Recent iron panel from 09/2023 with serum iron at 282, TIBC 356, transferrin saturation 79 and ferritin 25

## 2023-10-10 NOTE — Hospital Course (Addendum)
 Mrs. Bechtel was admitted to the hospital with the working diagnosis of acute influenza A infection, complicated with viral pneumonia.   89 yo female with the past medical history of heart failure, T2DM, GERD,  hypertension, and coronary artery disease, who presented with worsening generalized weakness and fatigue. Significant decreased in po intake at home. On her initial physical examination her blood pressure was 135/41, HR 60, RR 18 and 02 saturation 100%, dry mucous membranes, lungs with no wheezing or rhonchi, heart with S1 and S2 present and regular, abdomen with no distention and no lower extremity edema.    Na 134, K 4,4 CL 97 bicarbonate 25, glucose 135, bun 42. Cr 2,0  High sensitive troponin 57   Wbc 4.7 hgb 8,3 plt 179  Sars covid 19 negative Influenza A negative   Urine analysis SG 1,008, negative protein, large leukocytes, negative hgb. Wbc > 50   Chest radiograph with no cardiomegaly, positive interstitial infiltrate at the right lower lobe with left base atelectasis.   CT chest positive centrilobular emphysema, predominant at apical zones, minimal ground glass opacities at the left greater than right lung base. No cardiomegaly.   03/02 clinically improving, decreased 02 requirements.

## 2023-10-10 NOTE — Assessment & Plan Note (Signed)
 2024 Echocardiogram with preserved LV systolic function with EF 60 to 65%, severe LVH, RV with moderate reduction in systolic function, moderate RV enlargement, moderate RV wall thickness, mild elevated RVSP, LA with severe dilatation, RA with moderate dilatation, moderate TR, mild MR,  severe aortic stenosis,    Today patient with signs of volume overload and likely acute cardiogenic pulmonary edema.   Plan to resume diuretic therapy with furosemide, will give one dose today 60 mg IV and then continue with 40 mg po daily.  Discontinue IV fluids.  Resume carvedilol and isosorbide.

## 2023-10-11 DIAGNOSIS — N184 Chronic kidney disease, stage 4 (severe): Secondary | ICD-10-CM | POA: Diagnosis not present

## 2023-10-11 DIAGNOSIS — I5033 Acute on chronic diastolic (congestive) heart failure: Secondary | ICD-10-CM | POA: Diagnosis not present

## 2023-10-11 DIAGNOSIS — N390 Urinary tract infection, site not specified: Secondary | ICD-10-CM | POA: Diagnosis not present

## 2023-10-11 DIAGNOSIS — J189 Pneumonia, unspecified organism: Secondary | ICD-10-CM | POA: Diagnosis not present

## 2023-10-11 LAB — CBC WITH DIFFERENTIAL/PLATELET
Abs Immature Granulocytes: 0.02 10*3/uL (ref 0.00–0.07)
Basophils Absolute: 0 10*3/uL (ref 0.0–0.1)
Basophils Relative: 0 %
Eosinophils Absolute: 0 10*3/uL (ref 0.0–0.5)
Eosinophils Relative: 0 %
HCT: 23.6 % — ABNORMAL LOW (ref 36.0–46.0)
Hemoglobin: 7.5 g/dL — ABNORMAL LOW (ref 12.0–15.0)
Immature Granulocytes: 1 %
Lymphocytes Relative: 15 %
Lymphs Abs: 0.5 10*3/uL — ABNORMAL LOW (ref 0.7–4.0)
MCH: 32.6 pg (ref 26.0–34.0)
MCHC: 31.8 g/dL (ref 30.0–36.0)
MCV: 102.6 fL — ABNORMAL HIGH (ref 80.0–100.0)
Monocytes Absolute: 0.1 10*3/uL (ref 0.1–1.0)
Monocytes Relative: 3 %
Neutro Abs: 2.6 10*3/uL (ref 1.7–7.7)
Neutrophils Relative %: 81 %
Platelets: 181 10*3/uL (ref 150–400)
RBC: 2.3 MIL/uL — ABNORMAL LOW (ref 3.87–5.11)
RDW: 14.4 % (ref 11.5–15.5)
WBC: 3.1 10*3/uL — ABNORMAL LOW (ref 4.0–10.5)
nRBC: 0 % (ref 0.0–0.2)

## 2023-10-11 LAB — BASIC METABOLIC PANEL
Anion gap: 8 (ref 5–15)
BUN: 44 mg/dL — ABNORMAL HIGH (ref 8–23)
CO2: 24 mmol/L (ref 22–32)
Calcium: 8.9 mg/dL (ref 8.9–10.3)
Chloride: 102 mmol/L (ref 98–111)
Creatinine, Ser: 1.82 mg/dL — ABNORMAL HIGH (ref 0.44–1.00)
GFR, Estimated: 26 mL/min — ABNORMAL LOW (ref >60)
Glucose, Bld: 291 mg/dL — ABNORMAL HIGH (ref 70–99)
Potassium: 4.6 mmol/L (ref 3.5–5.1)
Sodium: 134 mmol/L — ABNORMAL LOW (ref 135–145)

## 2023-10-11 LAB — MAGNESIUM: Magnesium: 2.4 mg/dL (ref 1.7–2.4)

## 2023-10-11 LAB — GLUCOSE, CAPILLARY
Glucose-Capillary: 204 mg/dL — ABNORMAL HIGH (ref 70–99)
Glucose-Capillary: 260 mg/dL — ABNORMAL HIGH (ref 70–99)
Glucose-Capillary: 268 mg/dL — ABNORMAL HIGH (ref 70–99)
Glucose-Capillary: 294 mg/dL — ABNORMAL HIGH (ref 70–99)

## 2023-10-11 MED ORDER — ACETAMINOPHEN 325 MG PO TABS
650.0000 mg | ORAL_TABLET | Freq: Once | ORAL | Status: AC
  Administered 2023-10-11: 650 mg via ORAL
  Filled 2023-10-11: qty 2

## 2023-10-11 MED ORDER — CEFTRIAXONE SODIUM 2 G IJ SOLR
2.0000 g | INTRAMUSCULAR | Status: DC
  Administered 2023-10-12: 2 g via INTRAVENOUS
  Filled 2023-10-11: qty 20

## 2023-10-11 MED ORDER — INSULIN GLARGINE-YFGN 100 UNIT/ML ~~LOC~~ SOLN
5.0000 [IU] | SUBCUTANEOUS | Status: DC
  Administered 2023-10-11 – 2023-10-12 (×2): 5 [IU] via SUBCUTANEOUS
  Filled 2023-10-11 (×2): qty 0.05

## 2023-10-11 NOTE — Plan of Care (Signed)

## 2023-10-11 NOTE — Plan of Care (Signed)
  Problem: Education: Goal: Knowledge of General Education information will improve Description: Including pain rating scale, medication(s)/side effects and non-pharmacologic comfort measures Outcome: Progressing   Problem: Health Behavior/Discharge Planning: Goal: Ability to manage health-related needs will improve Outcome: Progressing   Problem: Clinical Measurements: Goal: Ability to maintain clinical measurements within normal limits will improve Outcome: Progressing Goal: Will remain free from infection Outcome: Progressing Goal: Respiratory complications will improve Outcome: Progressing   Problem: Activity: Goal: Risk for activity intolerance will decrease Outcome: Progressing   Problem: Nutrition: Goal: Adequate nutrition will be maintained Outcome: Progressing   Problem: Coping: Goal: Level of anxiety will decrease Outcome: Progressing   Problem: Elimination: Goal: Will not experience complications related to bowel motility Outcome: Progressing Goal: Will not experience complications related to urinary retention Outcome: Progressing   Problem: Pain Managment: Goal: General experience of comfort will improve and/or be controlled Outcome: Progressing   Problem: Safety: Goal: Ability to remain free from injury will improve Outcome: Progressing   Problem: Skin Integrity: Goal: Risk for impaired skin integrity will decrease Outcome: Progressing   Problem: Coping: Goal: Ability to adjust to condition or change in health will improve Outcome: Progressing   Problem: Fluid Volume: Goal: Ability to maintain a balanced intake and output will improve Outcome: Progressing   Problem: Health Behavior/Discharge Planning: Goal: Ability to identify and utilize available resources and services will improve Outcome: Progressing Goal: Ability to manage health-related needs will improve Outcome: Progressing   Problem: Metabolic: Goal: Ability to maintain appropriate  glucose levels will improve Outcome: Progressing   Problem: Nutritional: Goal: Maintenance of adequate nutrition will improve Outcome: Progressing Goal: Progress toward achieving an optimal weight will improve Outcome: Progressing   Problem: Skin Integrity: Goal: Risk for impaired skin integrity will decrease Outcome: Progressing   Problem: Tissue Perfusion: Goal: Adequacy of tissue perfusion will improve Outcome: Progressing   Problem: Clinical Measurements: Goal: Diagnostic test results will improve Outcome: Not Applicable Goal: Cardiovascular complication will be avoided Outcome: Not Applicable   Problem: Education: Goal: Ability to describe self-care measures that may prevent or decrease complications (Diabetes Survival Skills Education) will improve Outcome: Not Applicable Goal: Individualized Educational Video(s) Outcome: Not Applicable

## 2023-10-11 NOTE — Progress Notes (Addendum)
 Progress Note   [name] BMW:[id number] DOB: [birth year] DOA: 10/09/2023     2 DOS: the patient was seen and examined on 10/11/2023   Brief hospital course: Lisa Mcneil was admitted to the hospital with the working diagnosis of acute influenza A infection, complicated with viral pneumonia.   89 yo female with the past medical history of heart failure, T2DM, GERD,  hypertension, and coronary artery disease, who presented with worsening generalized weakness and fatigue. Significant decreased in po intake at home. On her initial physical examination her blood pressure was 135/41, HR 60, RR 18 and 02 saturation 100%, dry mucous membranes, lungs with no wheezing or rhonchi, heart with S1 and S2 present and regular, abdomen with no distention and no lower extremity edema.    Na 134, K 4,4 CL 97 bicarbonate 25, glucose 135, bun 42. Cr 2,0  High sensitive troponin 57   Wbc 4.7 hgb 8,3 plt 179  Sars covid 19 negative Influenza A negative   Urine analysis SG 1,008, negative protein, large leukocytes, negative hgb. Wbc > 50   Chest radiograph with no cardiomegaly, positive interstitial infiltrate at the right lower lobe with left base atelectasis.   CT chest positive centrilobular emphysema, predominant at apical zones, minimal ground glass opacities at the left greater than right lung base. No cardiomegaly.   03/02 clinically improving, decreased 02 requirements.   Assessment and Plan: * Right lower lobe pneumonia Acute influenza A  Community acquired pneumonia, viral pneumonia can not rule out bacterial component.  COPD exacerbation.  Acute hypoxemic respiratory failure with increased 02 requirements and dyspnea on exertion.   Continue antibiotic therapy with IV ceftriaxone and antiviral therapy with oseltamivir for 5 days.  Bronchodilator therapy and systemic corticosteroids.  Airway clearing techniques with flutter valve and incentive spirometer.  Antitussive agents,  out of bed to the chair, PT and OT.   Acute hypoxemic respiratory failure,  Improvement in 02 requirements with today's  02 saturation 98% on 2 L/min per Lisa Mcneil.   UTI (urinary tract infection) 10/08/23 urine culture positive for cirtrobacter. (Present on admission)  Sensitive to cephalosporins  Continue antibiotic therapy with IV ceftriaxone.   Acute on chronic diastolic CHF (congestive heart failure) (HCC) 2024 Echocardiogram with preserved LV systolic function with EF 60 to 65%, severe LVH, RV with moderate reduction in systolic function, moderate RV enlargement, moderate RV wall thickness, mild elevated RVSP, LA with severe dilatation, RA with moderate dilatation, moderate TR, mild MR,  severe aortic stenosis,    Clinically with improved volume status.  Urine output not documented.  Systolic blood pressure 130 mmHg range.   Continue furosemide 40 mg po daily.  Continue carvedilol and isosorbide.   CKD (chronic kidney disease) stage 4, GFR 15-29 ml/min (HCC) Hyponatremia.   Continue stable renal function with serum cr at 1,82 with K at 4,6 and serum bicarbonate at 24  Na 134 and Mg 2.4   Plan to continue furosemide at 40 mg po daily Follow up renal function and electrolytes in am.  Avoid hypotension and nephrotoxic medications.   Essential hypertension Continue with carvedilol and isosorbide, continue blood pressure monitoring.    CAD (coronary artery disease) No active chest pain, on isosorbide.  Mild elevation in high sensitive troponin due to heart failure exacerbation, ruled out acute coronary syndrome.   Type 2 diabetes mellitus with hyperlipidemia (HCC) Hyperglycemia uncontrolled T2DM, steroid induced,  Continue glucose cover and monitoring with insulin sliding scale.  Will add low dose basal insulin and  continue close monitoring.   Continue statin therapy.   GERD without esophagitis Continue with pantoprazole.    Iron deficiency Reactive leukopenia.  Chronic  anemia, with hgb today at 7,5.  Hold on PRBC transfusion for now.  Follow up cell count in am.  Recent iron panel from 09/2023 with serum iron at 282, TIBC 356, transferrin saturation 79 and ferritin 25     Subjective: Patient is feeling better, her dyspnea has improved and she was able to sleep last night   Physical Exam: Vitals:   10/10/23 1947 10/11/23 0517 10/11/23 0827 10/11/23 1000  BP: 137/68 (!) 133/56 (!) 124/59   Pulse: 93 72 88   Resp: 20 18 20    Temp: (!) 97.4 F (36.3 C) 97.9 F (36.6 C) 97.8 F (36.6 C)   TempSrc:      SpO2: 92% 95% 98%   Weight:    67 kg  Height:    5\' 1"  (1.549 m)   Neurology awake and alert ENT with mild pallor Cardiovascular with S1 and S2 present and regular, positive systolic murmur at the base with no gallops or rubs No JVD No lower extremity edema Respiratory with bilateral rhonchi and scattered rales, no wheezing Abdomen with no distention  Data Reviewed:    Family Communication: no family at the bedside. I spoke with patient's son over the phone we talked in detail about patient's condition, plan of care and prognosis and all questions were addressed.   Disposition: Status is: Inpatient Remains inpatient appropriate because: recovering respiratory failure   Planned Discharge Destination: Home    Author: Coralie Keens, MD 10/11/2023 1:39 PM  For on call review [email or web address].

## 2023-10-12 ENCOUNTER — Encounter: Payer: Self-pay | Admitting: Oncology

## 2023-10-12 ENCOUNTER — Other Ambulatory Visit: Payer: Self-pay

## 2023-10-12 DIAGNOSIS — N184 Chronic kidney disease, stage 4 (severe): Secondary | ICD-10-CM | POA: Diagnosis not present

## 2023-10-12 DIAGNOSIS — J09X1 Influenza due to identified novel influenza A virus with pneumonia: Secondary | ICD-10-CM | POA: Diagnosis not present

## 2023-10-12 DIAGNOSIS — N390 Urinary tract infection, site not specified: Secondary | ICD-10-CM | POA: Diagnosis not present

## 2023-10-12 DIAGNOSIS — I5033 Acute on chronic diastolic (congestive) heart failure: Secondary | ICD-10-CM | POA: Diagnosis not present

## 2023-10-12 LAB — CBC WITH DIFFERENTIAL/PLATELET
Abs Immature Granulocytes: 0.02 10*3/uL (ref 0.00–0.07)
Basophils Absolute: 0 10*3/uL (ref 0.0–0.1)
Basophils Relative: 0 %
Eosinophils Absolute: 0 10*3/uL (ref 0.0–0.5)
Eosinophils Relative: 0 %
HCT: 24.4 % — ABNORMAL LOW (ref 36.0–46.0)
Hemoglobin: 7.6 g/dL — ABNORMAL LOW (ref 12.0–15.0)
Immature Granulocytes: 0 %
Lymphocytes Relative: 9 %
Lymphs Abs: 0.5 10*3/uL — ABNORMAL LOW (ref 0.7–4.0)
MCH: 32.5 pg (ref 26.0–34.0)
MCHC: 31.1 g/dL (ref 30.0–36.0)
MCV: 104.3 fL — ABNORMAL HIGH (ref 80.0–100.0)
Monocytes Absolute: 0.1 10*3/uL (ref 0.1–1.0)
Monocytes Relative: 2 %
Neutro Abs: 5 10*3/uL (ref 1.7–7.7)
Neutrophils Relative %: 89 %
Platelets: 174 10*3/uL (ref 150–400)
RBC: 2.34 MIL/uL — ABNORMAL LOW (ref 3.87–5.11)
RDW: 14.3 % (ref 11.5–15.5)
WBC: 5.6 10*3/uL (ref 4.0–10.5)
nRBC: 0 % (ref 0.0–0.2)

## 2023-10-12 LAB — BASIC METABOLIC PANEL
Anion gap: 10 (ref 5–15)
BUN: 48 mg/dL — ABNORMAL HIGH (ref 8–23)
CO2: 23 mmol/L (ref 22–32)
Calcium: 9.7 mg/dL (ref 8.9–10.3)
Chloride: 103 mmol/L (ref 98–111)
Creatinine, Ser: 1.77 mg/dL — ABNORMAL HIGH (ref 0.44–1.00)
GFR, Estimated: 26 mL/min — ABNORMAL LOW (ref >60)
Glucose, Bld: 228 mg/dL — ABNORMAL HIGH (ref 70–99)
Potassium: 4.6 mmol/L (ref 3.5–5.1)
Sodium: 136 mmol/L (ref 135–145)

## 2023-10-12 LAB — MAGNESIUM: Magnesium: 2.5 mg/dL — ABNORMAL HIGH (ref 1.7–2.4)

## 2023-10-12 LAB — GLUCOSE, CAPILLARY
Glucose-Capillary: 261 mg/dL — ABNORMAL HIGH (ref 70–99)
Glucose-Capillary: 260 mg/dL — ABNORMAL HIGH (ref 70–99)

## 2023-10-12 MED ORDER — DEXTROMETHORPHAN-GUAIFENESIN 20-200 MG/20ML PO LIQD
10.0000 mL | ORAL | 0 refills | Status: DC | PRN
  Filled 2023-10-12: qty 118, 5d supply, fill #0

## 2023-10-12 MED ORDER — OSELTAMIVIR PHOSPHATE 30 MG PO CAPS
30.0000 mg | ORAL_CAPSULE | Freq: Every day | ORAL | 0 refills | Status: AC
  Filled 2023-10-12: qty 2, 2d supply, fill #0

## 2023-10-12 MED ORDER — CEPHALEXIN 500 MG PO CAPS
500.0000 mg | ORAL_CAPSULE | Freq: Two times a day (BID) | ORAL | 0 refills | Status: DC
  Filled 2023-10-12: qty 6, 3d supply, fill #0

## 2023-10-12 NOTE — Care Management Important Message (Signed)
 Important Message  Patient Details  Name: Lisa Mcneil MRN: [id number] Date of Birth: [birth year]   Important Message Given:  Yes - Medicare IM     Cristela Blue, CMA 10/12/2023, 10:38 AM

## 2023-10-12 NOTE — Discharge Summary (Signed)
 Physician Discharge Summary   [name] MRN: [id number] DOB: 02-12-1928  Admit date:     10/09/2023  Discharge date: 10/12/23  Discharge Physician: Marcelino Duster   PCP: Marisue Ivan, MD   Recommendations at discharge:  {Tip this will not be part of the note when signed- Example include specific recommendations for outpatient follow-up, pending tests to follow-up on. (Optional):26781}  PCP follow up in 1 week.  Discharge Diagnoses: Principal Problem:   Right lower lobe pneumonia Active Problems:   UTI (urinary tract infection)   Acute on chronic diastolic CHF (congestive heart failure) (HCC)   CKD (chronic kidney disease) stage 4, GFR 15-29 ml/min (HCC)   Essential hypertension   CAD (coronary artery disease)   Type 2 diabetes mellitus with hyperlipidemia (HCC)   GERD without esophagitis   Iron deficiency  Resolved Problems:   * No resolved hospital problems. [facility] Course: Lisa Mcneil was admitted to the hospital with the working diagnosis of acute influenza A infection, complicated with viral pneumonia.   89 yo female with the past medical history of heart failure, T2DM, GERD,  hypertension, and coronary artery disease, who presented with worsening generalized weakness and fatigue. Significant decreased in po intake at home. On her initial physical examination her blood pressure was 135/41, HR 60, RR 18 and 02 saturation 100%, dry mucous membranes, lungs with no wheezing or rhonchi, heart with S1 and S2 present and regular, abdomen with no distention and no lower extremity edema.    Na 134, K 4,4 CL 97 bicarbonate 25, glucose 135, bun 42. Cr 2,0  High sensitive troponin 57   Wbc 4.7 hgb 8,3 plt 179  Sars covid 19 negative Influenza A negative   Urine analysis SG 1,008, negative protein, large leukocytes, negative hgb. Wbc > 50   Chest radiograph with no cardiomegaly, positive interstitial infiltrate at the right lower lobe with left  base atelectasis.   CT chest positive centrilobular emphysema, predominant at apical zones, minimal ground glass opacities at the left greater than right lung base. No cardiomegaly.   03/02 clinically improving, decreased 02 requirements.   Assessment and Plan: * Right lower lobe pneumonia Acute influenza A  Community acquired pneumonia, viral pneumonia can not rule out bacterial component.  COPD exacerbation.  Acute hypoxemic respiratory failure with increased 02 requirements and dyspnea on exertion.   Continue antibiotic therapy with IV ceftriaxone and antiviral therapy with oseltamivir for 5 days.  Bronchodilator therapy and systemic corticosteroids.  Airway clearing techniques with flutter valve and incentive spirometer.  Antitussive agents, out of bed to the chair, PT and OT.   Acute hypoxemic respiratory failure,  Improvement in 02 requirements with today's  02 saturation 98% on 2 L/min per [place].   UTI (urinary tract infection) 10/08/23 urine culture positive for cirtrobacter. (Present on admission)  Sensitive to cephalosporins  Continue antibiotic therapy with IV ceftriaxone.   Acute on chronic diastolic CHF (congestive heart failure) (HCC) 2024 Echocardiogram with preserved LV systolic function with EF 60 to 65%, severe LVH, RV with moderate reduction in systolic function, moderate RV enlargement, moderate RV wall thickness, mild elevated RVSP, LA with severe dilatation, RA with moderate dilatation, moderate TR, mild MR,  severe aortic stenosis,    Clinically with improved volume status.  Urine output not documented.  Systolic blood pressure 130 mmHg range.   Continue furosemide 40 mg po daily.  Continue carvedilol and isosorbide.   CKD (chronic kidney disease) stage 4, GFR 15-29 ml/min (HCC) Hyponatremia.  Continue stable renal function with serum cr at 1,82 with K at 4,6 and serum bicarbonate at 24  Na 134 and Mg 2.4   Plan to continue furosemide at 40 mg po  daily Follow up renal function and electrolytes in am.  Avoid hypotension and nephrotoxic medications.   Essential hypertension Continue with carvedilol and isosorbide, continue blood pressure monitoring.    CAD (coronary artery disease) No active chest pain, on isosorbide.  Mild elevation in high sensitive troponin due to heart failure exacerbation, ruled out acute coronary syndrome.   Type 2 diabetes mellitus with hyperlipidemia (HCC) Hyperglycemia uncontrolled T2DM, steroid induced,  Continue glucose cover and monitoring with insulin sliding scale.  Will add low dose basal insulin and continue close monitoring.   Continue statin therapy.   GERD without esophagitis Continue with pantoprazole.    Iron deficiency Reactive leukopenia.  Chronic anemia, with hgb today at 7,5.  Hold on PRBC transfusion for now.  Follow up cell count in am.  Recent iron panel from 09/2023 with serum iron at 282, TIBC 356, transferrin saturation 79 and ferritin 25        {Tip this will not be part of the note when signed Body mass index is 27.91 kg/m. , ,  (Optional):26781}  {(NOTE) Pain control PDMP Statment (Optional):26782} Consultants: *** Procedures performed: ***  Disposition: {Plan; Disposition:26390} Diet recommendation:  Discharge Diet Orders (From admission, onward)     Start     Ordered   10/12/23 0000  Diet - low sodium heart healthy        10/12/23 1621           Cardiac diet DISCHARGE MEDICATION: Allergies as of 10/12/2023       Reactions   Atorvastatin Other (See Comments)   Codeine    Demerol [meperidine]    Naproxen Rash   Caused rash on mouth        Medication List     TAKE these medications    allopurinol 100 MG tablet Commonly known as: Zyloprim Take 0.5 tablets (50 mg total) by mouth daily.   aspirin EC 81 MG tablet Take 1 tablet (81 mg total) by mouth daily. Swallow whole.   carvedilol 3.125 MG tablet Commonly known as: COREG Take 1  tablet (3.125 mg total) by mouth 2 (two) times daily with a meal.   cephALEXin 500 MG capsule Commonly known as: KEFLEX Take 1 capsule (500 mg total) by mouth in the morning and at bedtime for 3 days. What changed: when to take this   ferrous sulfate 325 (65 FE) MG tablet Take 325 mg by mouth daily.   furosemide 40 MG tablet Commonly known as: Lasix Take 1 tablet (40 mg total) by mouth 2 (two) times daily.   gabapentin 100 MG capsule Commonly known as: NEURONTIN Take 100 mg by mouth at bedtime.   guaiFENesin-dextromethorphan 100-10 MG/5ML syrup Commonly known as: ROBITUSSIN DM Take 5 mLs by mouth every 4 (four) hours as needed for cough.   isosorbide mononitrate 60 MG 24 hr tablet Commonly known as: IMDUR Take by mouth daily.   Melatonin 10 MG Tabs Take 20 mg by mouth at bedtime.   multivitamin with minerals tablet Take 1 tablet by mouth daily.   neomycin-polymyxin b-dexamethasone 3.5-10000-0.1 Susp Commonly known as: MAXITROL Place 1 drop into the right eye 3 (three) times daily.   nitroGLYCERIN 0.4 MG SL tablet Commonly known as: NITROSTAT Place 1 tablet (0.4 mg total) under the tongue every 5 (five) minutes  as needed for chest pain.   omeprazole 20 MG capsule Commonly known as: PRILOSEC Take 20 mg by mouth daily.   oseltamivir 30 MG capsule Commonly known as: TAMIFLU Take 1 capsule (30 mg total) by mouth daily for 2 days. Start taking on: October 13, 2023   pramipexole 0.25 MG tablet Commonly known as: MIRAPEX Take 0.25 mg by mouth in the morning and at bedtime.   rosuvastatin 5 MG tablet Commonly known as: CRESTOR Take 5 mg by mouth at bedtime.   traZODone 50 MG tablet Commonly known as: DESYREL Take 50 mg by mouth at bedtime.        Follow-up Information     Marisue Ivan, MD Follow up.   Specialty: Family Medicine Why: Hospital follow up Contact information: [address] [facility] [place]  [address] [phone]                Discharge Exam: Ceasar Mons Weights   10/11/23 1000  Weight: 67 kg   ***  Condition at discharge: stable  The results of significant diagnostics from this hospitalization (including imaging, microbiology, ancillary and laboratory) are listed below for reference.   Imaging Studies: CT CHEST WO CONTRAST Result Date: 10/09/2023 CLINICAL DATA:  Respiratory illness EXAM: CT CHEST WITHOUT CONTRAST TECHNIQUE: Multidetector CT imaging of the chest was performed following the standard protocol without IV contrast. RADIATION DOSE REDUCTION: This exam was performed according to the departmental dose-optimization program which includes automated exposure control, adjustment of the mA and/or kV according to patient size and/or use of iterative reconstruction technique. COMPARISON:  Chest x-ray 10/09/2023, CT chest 06/13/2022 FINDINGS: Cardiovascular: Limited evaluation without intravenous contrast. Advanced aortic atherosclerosis. No aneurysm. Coronary vascular calcification. Cardiomegaly. No pericardial effusion Mediastinum/Nodes: Patent trachea. No suspicious thyroid mass. Calcified mediastinal lymph nodes consistent with prior granulomatous disease. Stable mildly enlarged mediastinal nodes. AP window lymph node measures 10 mm compared with 10 mm previously. Precarinal node measures 14 mm compared with 15 mm previously. Esophagus within normal limits Lungs/Pleura: Emphysema. Streaky linear areas of atelectasis or scarring. No confluent airspace disease. Minimal ground-glass density at the left greater than right lung base. Upper Abdomen: No acute finding Musculoskeletal: No acute osseous abnormality IMPRESSION: 1. Emphysema. Minimal ground-glass density at the left greater than right lung base suspected to be inflammatory. No confluent airspace disease is seen. 2. Cardiomegaly Aortic Atherosclerosis (ICD10-I70.0) and Emphysema (ICD10-J43.9). Electronically Signed   By: Jasmine Pang M.D.   On: 10/09/2023 22:46   DG Chest Portable 1 View Result Date: 10/09/2023 CLINICAL DATA:  Shortness of breath and weakness. New diagnosis of influenza EXAM: PORTABLE CHEST 1 VIEW COMPARISON:  Chest radiograph dated 10/08/2023 FINDINGS: Normal lung volumes. Slightly increased bilateral lower lung interstitial and patchy opacities. No pleural effusion or pneumothorax. Similar enlarged cardiomediastinal silhouette. No acute osseous abnormality. IMPRESSION: Slightly increased bilateral lower lung interstitial and patchy opacities, which may represent atypical infection or pulmonary edema. Electronically Signed   By: Agustin Cree M.D.   On: 10/09/2023 11:22   DG Chest Portable 1 View Result Date: 10/08/2023 CLINICAL DATA:  Weakness. EXAM: PORTABLE CHEST 1 VIEW COMPARISON:  03/26/2023. FINDINGS: Redemonstration of increased interstitial markings, similar to prior study. There are asymmetric more interstitial markings on the right than left. Findings are nonspecific and differential diagnosis include underlying emphysema, interstitial lung disease, asymmetric pulmonary edema, etc. Correlate clinically. Bilateral lung fields are clear. No dense consolidation or lung collapse. Bilateral costophrenic angles are clear. Stable cardio-mediastinal silhouette. No acute osseous  abnormalities. The soft tissues are within normal limits. IMPRESSION: *Redemonstration of increased interstitial markings, similar to prior study. There are asymmetric more interstitial markings on the right than left. Findings are nonspecific and differential diagnosis include underlying emphysema, interstitial lung disease, asymmetric pulmonary edema, etc. Electronically Signed   By: Jules Schick M.D.   On: 10/08/2023 14:25    Microbiology: Results for orders placed or performed during the hospital encounter of 10/08/23  Resp panel by RT-PCR (RSV, Flu A&B, Covid) Anterior Nasal Swab     Status: Abnormal   Collection Time: 10/08/23  11:23 AM   Specimen: Anterior Nasal Swab  Result Value Ref Range Status   SARS Coronavirus 2 by RT PCR NEGATIVE NEGATIVE Final    Comment: (NOTE) SARS-CoV-2 target nucleic acids are NOT DETECTED.  The SARS-CoV-2 RNA is generally detectable in upper respiratory specimens during the acute phase of infection. The lowest concentration of SARS-CoV-2 viral copies this assay can detect is 138 copies/mL. A negative result does not preclude SARS-Cov-2 infection and should not be used as the sole basis for treatment or other patient management decisions. A negative result may occur with  improper specimen collection/handling, submission of specimen other than nasopharyngeal swab, presence of viral mutation(s) within the areas targeted by this assay, and inadequate number of viral copies(<138 copies/mL). A negative result must be combined with clinical observations, patient history, and epidemiological information. The expected result is Negative.  Fact Sheet for Patients:  BloggerCourse.com  Fact Sheet for Healthcare Providers:  SeriousBroker.it  This test is no t yet approved or cleared by the [place] FDA and  has been authorized for detection and/or diagnosis of SARS-CoV-2 by FDA under an Emergency Use Authorization (EUA). This EUA will remain  in effect (meaning this test can be used) for the duration of the COVID-19 declaration under Section 564(b)(1) of the Act, 21 U.S.C.section 360bbb-3(b)(1), unless the authorization is terminated  or revoked sooner.       Influenza A by PCR POSITIVE (A) NEGATIVE Final   Influenza B by PCR NEGATIVE NEGATIVE Final    Comment: (NOTE) The Xpert Xpress SARS-CoV-2/FLU/RSV plus assay is intended as an aid in the diagnosis of influenza from Nasopharyngeal swab specimens and should not be used as a sole basis for treatment. Nasal washings and aspirates are unacceptable for Xpert Xpress  SARS-CoV-2/FLU/RSV testing.  Fact Sheet for Patients: BloggerCourse.com  Fact Sheet for Healthcare Providers: SeriousBroker.it  This test is not yet approved or cleared by the [place] FDA and has been authorized for detection and/or diagnosis of SARS-CoV-2 by FDA under an Emergency Use Authorization (EUA). This EUA will remain in effect (meaning this test can be used) for the duration of the COVID-19 declaration under Section 564(b)(1) of the Act, 21 U.S.C. section 360bbb-3(b)(1), unless the authorization is terminated or revoked.     Resp Syncytial Virus by PCR NEGATIVE NEGATIVE Final    Comment: (NOTE) Fact Sheet for Patients: BloggerCourse.com  Fact Sheet for Healthcare Providers: SeriousBroker.it  This test is not yet approved or cleared by the [place] FDA and has been authorized for detection and/or diagnosis of SARS-CoV-2 by FDA under an Emergency Use Authorization (EUA). This EUA will remain in effect (meaning this test can be used) for the duration of the COVID-19 declaration under Section 564(b)(1) of the Act, 21 U.S.C. section 360bbb-3(b)(1), unless the authorization is terminated or revoked.  Performed at [facility], [address] [place], [place] [address]   Urine Culture  Status: Abnormal   Collection Time: 10/08/23 11:43 AM   Specimen: Urine, Random  Result Value Ref Range Status   Specimen Description   Final    URINE, RANDOM Performed at [facility], [address] [place], [place] [address]    Special Requests   Final    NONE Performed at [facility], [address], [place], [place] [address]    Culture >=100,000 COLONIES/mL CITROBACTER AMALONATICUS (A)  Final   Report Status 10/10/2023 FINAL  Final   Organism ID, Bacteria CITROBACTER AMALONATICUS (A)  Final      Susceptibility   Citrobacter  amalonaticus - MIC*    CEFEPIME <=0.12 SENSITIVE Sensitive     CEFTRIAXONE <=0.25 SENSITIVE Sensitive     CIPROFLOXACIN <=0.25 SENSITIVE Sensitive     GENTAMICIN <=1 SENSITIVE Sensitive     IMIPENEM <=0.25 SENSITIVE Sensitive     NITROFURANTOIN 64 INTERMEDIATE Intermediate     TRIMETH/SULFA >=320 RESISTANT Resistant     PIP/TAZO <=4 SENSITIVE Sensitive ug/mL    * >=100,000 COLONIES/mL CITROBACTER AMALONATICUS    Labs: CBC: Recent Labs  Lab 10/06/23 1052 10/08/23 1036 10/09/23 0913 10/10/23 0453 10/11/23 0335 10/12/23 0430  WBC 3.6* 4.8 4.7 3.2* 3.1* 5.6  NEUTROABS 2.4  --  3.5  --  2.6 5.0  HGB 7.6* 8.1* 8.3* 7.2* 7.5* 7.6*  HCT 25.2* 26.5* 27.3* 23.0* 23.6* 24.4*  MCV 107.7* 107.7* 107.9* 104.5* 102.6* 104.3*  PLT 180 177 179 152 181 174   Basic Metabolic Panel: Recent Labs  Lab 10/08/23 1036 10/09/23 0913 10/10/23 0453 10/11/23 0335 10/12/23 0430  NA 132* 134* 138 134* 136  K 4.4 4.4 4.1 4.6 4.6  CL 96* 97* 108 102 103  CO2 25 25 22 24 23   GLUCOSE 206* 139* 125* 291* 228*  BUN 44* 42* 35* 44* 48*  CREATININE 2.28* 2.07* 1.72* 1.82* 1.77*  CALCIUM 9.0 8.8* 8.6* 8.9 9.7  MG  --   --   --  2.4 2.5*   Liver Function Tests: Recent Labs  Lab 10/08/23 1036 10/10/23 0453  AST 18 14*  ALT 14 11  ALKPHOS 67 57  BILITOT 0.7 0.5  PROT 6.8 5.8*  ALBUMIN 3.4* 2.9*   CBG: Recent Labs  Lab 10/11/23 1258 10/11/23 1536 10/11/23 2356 10/12/23 0907 10/12/23 1203  GLUCAP 268* 204* 294* 261* 260*    Discharge time spent: 35 minutes.  Signed: Marcelino Duster, MD [facility] 10/12/2023

## 2023-10-12 NOTE — Progress Notes (Signed)
 Occupational Therapy Treatment Patient Details Name: Lisa Mcneil MRN: [id number] DOB: 11/19/1927 Today's Date: 10/12/2023   History of present illness Lisa Mcneil is a 89 y.o. female with medical history significant of HFpEF, type 2 diabetes, GERD, hypertension, CAD, stage III-IV CKD, presenting with influenza A, weakness, UTI.  Patient noted to have been seen yesterday in ER for influenza A as well as diagnosis of UTI.  Per patient, she has had worsening weakness and fatigue at home.  Positive decreased p.o. intake.  Positive nausea.   OT comments  Pt seen for OT tx. Pt received up in recliner, denies complaints, and endorses feeling well. Pt politely declined out of recliner activity reporting she had just recently been ambulating and up to the bathroom with staff prior to OT's arrival. Pt educated in ECS and falls prevention with emphasis on gradual return to daily routines with recommendations for modifications for bathing and working to ensure pt able to participate and minimize risk of over exertion and falls. Pt eager to return to home and return to normal routine, including hostess role at local diner. Pt progressing towards goals and will continue to progress as able while hospitalized.       If plan is discharge home, recommend the following:  Assist for transportation;Assistance with cooking/housework   Equipment Recommendations  None recommended by OT    Recommendations for Other Services      Precautions / Restrictions Precautions Precautions: Fall Restrictions Weight Bearing Restrictions Per Provider Order: No       Mobility Bed Mobility               General bed mobility comments: NT, in recliner    Transfers                   General transfer comment: pt politely declined, endorses recently up     Balance                                           ADL either performed or assessed with clinical judgement   ADL                                               Extremity/Trunk Assessment              Vision       Perception     Praxis     Communication     Cognition Arousal: Alert Behavior During Therapy: [facility] for tasks assessed/performed Cognition: No apparent impairments                                        Cueing      Exercises Other Exercises Other Exercises: Pt educated in ECS and falls prevention with emphasis on gradual return to daily routines with recommendations for modifications for bathing and working to ensure pt able to participate and minimize risk of over exertion and falls.    Shoulder Instructions       General Comments      Pertinent Vitals/ Pain       Pain Assessment Pain Assessment: No/denies pain  Home Living  Prior Functioning/Environment              Frequency  Min 1X/week        Progress Toward Goals  OT Goals(current goals can now be found in the care plan section)  Progress towards OT goals: Progressing toward goals  Acute Rehab OT Goals OT Goal Formulation: With patient Time For Goal Achievement: 10/23/23 Potential to Achieve Goals: Good  Plan      Co-evaluation                 AM-PAC OT "6 Clicks" Daily Activity     Outcome Measure   Help from another person eating meals?: None Help from another person taking care of personal grooming?: None Help from another person toileting, which includes using toliet, bedpan, or urinal?: A Little Help from another person bathing (including washing, rinsing, drying)?: A Little Help from another person to put on and taking off regular upper body clothing?: None Help from another person to put on and taking off regular lower body clothing?: None 6 Click Score: 22    End of Session    OT Visit Diagnosis: Muscle weakness (generalized) (M62.81);Other abnormalities of gait and mobility  (R26.89)   Activity Tolerance Patient tolerated treatment well   Patient Left in chair;with call bell/phone within reach;with chair alarm set   Nurse Communication          Time: 4098-1191 OT Time Calculation (min): 10 min  Charges: OT General Charges $OT Visit: 1 Visit OT Treatments $Self Care/Home Management : 8-22 mins  Arman Filter., MPH, MS, OTR/L ascom [phone] 10/12/23, 3:21 PM

## 2023-10-12 NOTE — Plan of Care (Signed)
  Problem: Health Behavior/Discharge Planning: Goal: Ability to manage health-related needs will improve Outcome: Progressing   Problem: Clinical Measurements: Goal: Ability to maintain clinical measurements within normal limits will improve Outcome: Progressing   Problem: Activity: Goal: Risk for activity intolerance will decrease Outcome: Progressing   Problem: Coping: Goal: Level of anxiety will decrease Outcome: Progressing   

## 2023-10-12 NOTE — Progress Notes (Signed)
 Physical Therapy Treatment Patient Details Name: Lisa Mcneil MRN: [id number] DOB: [birth year] Today's Date: 10/12/2023   History of Present Illness Lisa Mcneil is a 89 y.o. female with medical history significant of HFpEF, type 2 diabetes, GERD, hypertension, CAD, stage III-IV CKD, presenting with influenza A, weakness, UTI.  Patient noted to have been seen yesterday in ER for influenza A as well as diagnosis of UTI.  Per patient, she has had worsening weakness and fatigue at home.  Positive decreased p.o. intake.  Positive nausea.    PT Comments  Pt ready for session.  Stated she is awaiting DC home but no orders noted in chart.  She is able to stand and walk in room and about 50' in hallway with no AD.  Gait slightly unsteady but as she fatigues she does reach for my hand and wants +1 HHA to return 150' to room.  Discussed RW use while she recovers but she is firm in not wanting one firmly stating "no".  She attributes weakness to not moving around while here and anticipates return to baseline once home.  Discussed energy conservation and general safety and she continues to decliner RW stating her apartment is small and it is not needed.     If plan is discharge home, recommend the following: A little help with walking and/or transfers;Assist for transportation;Help with stairs or ramp for entrance   Can travel by private vehicle        Equipment Recommendations  None recommended by PT (pt encouraged to use RW but stated she does not want it)    Recommendations for Other Services       Precautions / Restrictions Precautions Precautions: Fall Restrictions Weight Bearing Restrictions Per Provider Order: No     Mobility  Bed Mobility Overal bed mobility: Independent               Patient Response: Cooperative  Transfers Overall transfer level: Independent                      Ambulation/Gait Ambulation/Gait assistance: Min assist, Contact guard  assist Gait Distance (Feet): 200 Feet Assistive device: 1 person hand held assist, None Gait Pattern/deviations: Step-through pattern, Decreased step length - right, Decreased step length - left       General Gait Details: initially does fair but as she fatigues does need +1 HHA and self initiated rest breaks   Stairs             Wheelchair Mobility     Tilt Bed Tilt Bed Patient Response: Cooperative  Modified Rankin (Stroke Patients Only)       Balance Overall balance assessment: Needs assistance Sitting-balance support: Feet supported Sitting balance-Leahy Scale: Normal     Standing balance support: No upper extremity supported Standing balance-Leahy Scale: Fair Standing balance comment: decreases with fatigue and dynamic activities                            Communication    Cognition Arousal: Alert Behavior During Therapy: WFL for tasks assessed/performed   PT - Cognitive impairments: No apparent impairments                                Cueing    Exercises      General Comments        Pertinent Vitals/Pain Pain Assessment Pain Assessment: No/denies pain  Home Living                          Prior Function            PT Goals (current goals can now be found in the care plan section) Progress towards PT goals: Progressing toward goals    Frequency    Min 1X/week      PT Plan      Co-evaluation              AM-PAC PT "6 Clicks" Mobility   Outcome Measure  Help needed turning from your back to your side while in a flat bed without using bedrails?: None Help needed moving from lying on your back to sitting on the side of a flat bed without using bedrails?: None Help needed moving to and from a bed to a chair (including a wheelchair)?: None Help needed standing up from a chair using your arms (e.g., wheelchair or bedside chair)?: None Help needed to walk in hospital room?: A Little Help  needed climbing 3-5 steps with a railing? : A Little 6 Click Score: 22    End of Session Equipment Utilized During Treatment: Gait belt Activity Tolerance: Patient tolerated treatment well Patient left: in chair;with call bell/phone within reach;with chair alarm set Nurse Communication: Mobility status PT Visit Diagnosis: Unsteadiness on feet (R26.81);Muscle weakness (generalized) (M62.81)     Time: 4098-1191 PT Time Calculation (min) (ACUTE ONLY): 9 min  Charges:    $Gait Training: 8-22 mins PT General Charges $$ ACUTE PT VISIT: 1 Visit                    Danielle Dess, PTA 10/12/23, 2:20 PM

## 2023-10-12 NOTE — TOC Progression Note (Signed)
 Transition of Care [facility]) - Progression Note    Patient Details  Name: Lisa Mcneil MRN: [id number] Date of Birth: 08/30/1927  Transition of Care [facility]) CM/SW Contact  Erin Sons, [place] Phone Number: 10/12/2023, 4:31 PM  Clinical Narrative:     Met with pt to discuss HH recommendation. She declines HH. She states she lives at home alone. Does not want OPPT either. She states her grandson will transport her home.        Expected Discharge Plan and Services         Expected Discharge Date: 10/12/23                                     Social Determinants of Health (SDOH) Interventions SDOH Screenings   Food Insecurity: No Food Insecurity (10/09/2023)  Housing: Low Risk  (10/09/2023)  Transportation Needs: No Transportation Needs (10/09/2023)  Utilities: Not At Risk (10/09/2023)  Financial Resource Strain: Low Risk  (05/27/2023)   Received from [facility]  Social Connections: Unknown (10/09/2023)  Tobacco Use: Medium Risk (10/09/2023)    Readmission Risk Interventions    03/23/2023    4:17 PM 09/15/2022    1:07 PM  Readmission Risk Prevention Plan  Transportation Screening Complete Complete  PCP or Specialist Appt within 3-5 Days Complete Complete  HRI or Home Care Consult Complete   Social Work Consult for Recovery Care Planning/Counseling Not Complete Complete  Palliative Care Screening Not Applicable Not Applicable  Medication Review Oceanographer) Complete Complete

## 2023-10-13 NOTE — Consult Note (Signed)
 [facility] Liaison Note  10/13/2023  Lisa Mcneil 07-26-1928 [id number]  Location: RN Hospital Liaison screened the patient remotely at [facility].  Insurance: [facility]   Lisa Mcneil is a 89 y.o. female who is a Primary Care Patient of Marisue Ivan, MD [facility]).The patient was screened for readmission hospitalization with noted high risk score for unplanned readmission risk with 1 IP/2 ED in 6 months.  The patient was assessed for potential Care Management service needs for post hospital transition for care coordination. Review of patient's electronic medical record reveals patient was admitted for right lower lobe pneumonia. Liaison outreach to pt and introduced VBCI services and offer a post hospital prevention readmission follow up call for potential care management services due to her risk. Pt opt to declined indicating she does not needs services. No needs or request to address at this time. This provided will provided transition of care hospital follow up calls. No care coordination services needed at this time.   VBCI Care Management/Population Health does not replace or interfere with any arrangements made by the Inpatient Transition of Care team.   For questions contact:   Elliot Cousin, RN, BSN Hospital Liaison [place]   [facility], Population Health Office Hours MTWF  8:00 am-6:00 pm Direct Dial: [phone] mobile [email or web address]

## 2023-10-14 ENCOUNTER — Other Ambulatory Visit: Payer: Self-pay

## 2023-10-14 DIAGNOSIS — D509 Iron deficiency anemia, unspecified: Secondary | ICD-10-CM

## 2023-10-15 ENCOUNTER — Inpatient Hospital Stay: Payer: Medicare HMO | Attending: Oncology

## 2023-10-15 ENCOUNTER — Encounter: Payer: Self-pay | Admitting: Oncology

## 2023-10-15 ENCOUNTER — Inpatient Hospital Stay: Payer: Medicare HMO | Admitting: Oncology

## 2023-10-15 VITALS — BP 120/50 | HR 66 | Temp 96.8°F | Resp 16 | Ht 61.0 in | Wt 139.0 lb

## 2023-10-15 DIAGNOSIS — Z87891 Personal history of nicotine dependence: Secondary | ICD-10-CM | POA: Insufficient documentation

## 2023-10-15 DIAGNOSIS — K264 Chronic or unspecified duodenal ulcer with hemorrhage: Secondary | ICD-10-CM | POA: Diagnosis present

## 2023-10-15 DIAGNOSIS — R5383 Other fatigue: Secondary | ICD-10-CM | POA: Diagnosis not present

## 2023-10-15 DIAGNOSIS — D509 Iron deficiency anemia, unspecified: Secondary | ICD-10-CM | POA: Diagnosis not present

## 2023-10-15 LAB — CBC WITH DIFFERENTIAL/PLATELET
Abs Immature Granulocytes: 0.04 10*3/uL (ref 0.00–0.07)
Basophils Absolute: 0 10*3/uL (ref 0.0–0.1)
Basophils Relative: 1 %
Eosinophils Absolute: 0.2 10*3/uL (ref 0.0–0.5)
Eosinophils Relative: 2 %
HCT: 32.5 % — ABNORMAL LOW (ref 36.0–46.0)
Hemoglobin: 10.1 g/dL — ABNORMAL LOW (ref 12.0–15.0)
Immature Granulocytes: 1 %
Lymphocytes Relative: 8 %
Lymphs Abs: 0.7 10*3/uL (ref 0.7–4.0)
MCH: 32.5 pg (ref 26.0–34.0)
MCHC: 31.1 g/dL (ref 30.0–36.0)
MCV: 104.5 fL — ABNORMAL HIGH (ref 80.0–100.0)
Monocytes Absolute: 0.4 10*3/uL (ref 0.1–1.0)
Monocytes Relative: 5 %
Neutro Abs: 7.2 10*3/uL (ref 1.7–7.7)
Neutrophils Relative %: 83 %
Platelets: 225 10*3/uL (ref 150–400)
RBC: 3.11 MIL/uL — ABNORMAL LOW (ref 3.87–5.11)
RDW: 14.1 % (ref 11.5–15.5)
WBC: 8.6 10*3/uL (ref 4.0–10.5)
nRBC: 0 % (ref 0.0–0.2)

## 2023-10-15 LAB — IRON AND TIBC
Iron: 64 ug/dL (ref 28–170)
Saturation Ratios: 17 % (ref 10.4–31.8)
TIBC: 370 ug/dL (ref 250–450)
UIBC: 306 ug/dL

## 2023-10-15 LAB — SAMPLE TO BLOOD BANK

## 2023-10-15 LAB — FERRITIN: Ferritin: 25 ng/mL (ref 11–307)

## 2023-10-15 NOTE — Progress Notes (Signed)
 [facility]  Telephone:([phone]) [phone] Fax:(336) [phone]  ID: Lisa Mcneil OB: Mar 14, 1928  MR#: [id number]  MVH#:[id number]  Patient Care Team: Marisue Ivan, MD as PCP - General (Family Medicine) Jeralyn Ruths, MD as Consulting Physician (Hematology and Oncology)  CHIEF COMPLAINT:  Iron deficiency anemia.  INTERVAL HISTORY: Patient returns to clinic today for repeat laboratory work, further evaluation, and consideration of additional blood transfusion.  Despite transfer to the emergency room last week and receiving blood transfusion, patient has no recollection of this occurrence.  She continues to have weakness and fatigue, but feels significantly improved.  She remains active and works every Monday, Wednesday, and Friday at the [employer] in [place].  She has no neurologic complaints. She denies any recent fevers or illnesses. She has a good appetite and denies weight loss.  She denies any chest pain, shortness of breath, cough, or hemoptysis.  She denies any nausea, vomiting, consultation, or diarrhea. She has no melena or hematochezia.  She has no urinary complaints.  Patient offers no further specific complaints today.  REVIEW OF SYSTEMS:   Review of Systems  Constitutional:  Positive for malaise/fatigue. Negative for fever and weight loss.  Respiratory: Negative.  Negative for cough and shortness of breath.   Cardiovascular: Negative.  Negative for chest pain and leg swelling.  Gastrointestinal: Negative.  Negative for abdominal pain, blood in stool and melena.  Genitourinary: Negative.  Negative for hematuria.  Musculoskeletal: Negative.  Negative for back pain.  Skin: Negative.  Negative for rash.  Neurological:  Positive for weakness. Negative for dizziness, focal weakness and headaches.  Psychiatric/Behavioral: Negative.  The patient is not nervous/anxious.     As per HPI. Otherwise, a complete review of systems is negative.  PAST  MEDICAL HISTORY: Past Medical History:  Diagnosis Date   CHF (congestive heart failure) (HCC)    Diabetes mellitus without complication (HCC)    GERD (gastroesophageal reflux disease)    Hypertension    Insomnia    Iron deficiency anemia    RLS (restless legs syndrome)     PAST SURGICAL HISTORY: Past Surgical History:  Procedure Laterality Date   BLADDER REPAIR     CATARACT EXTRACTION W/PHACO Right 05/13/2016   Procedure: CATARACT EXTRACTION PHACO AND INTRAOCULAR LENS PLACEMENT (IOC);  Surgeon: Galen Manila, MD;  Location: [facility] ORS;  Service: Ophthalmology;  Laterality: Right;  [place] 1.09 AP% 21.0 CDE 14.60 Fluid Pack Lot # [id number]   CATARACT EXTRACTION W/PHACO Left 07/15/2016   Procedure: CATARACT EXTRACTION PHACO AND INTRAOCULAR LENS PLACEMENT (IOC);  Surgeon: Galen Manila, MD;  Location: [facility] ORS;  Service: Ophthalmology;  Laterality: Left;  Lot# [id number] H [place]: 01:17.9 AP%:27.8 CDE: 21.67    ESOPHAGOGASTRODUODENOSCOPY (EGD) WITH PROPOFOL N/A 03/26/2023   Procedure: ESOPHAGOGASTRODUODENOSCOPY (EGD) WITH PROPOFOL;  Surgeon: Midge Minium, MD;  Location: [facility] ENDOSCOPY;  Service: Endoscopy;  Laterality: N/A;   HEMOSTASIS CLIP PLACEMENT  03/26/2023   Procedure: HEMOSTASIS CLIP PLACEMENT;  Surgeon: Midge Minium, MD;  Location: [facility] ENDOSCOPY;  Service: Endoscopy;;   HEMOSTASIS CONTROL  03/26/2023   Procedure: HEMOSTASIS CONTROL;  Surgeon: Midge Minium, MD;  Location: [facility] ENDOSCOPY;  Service: Endoscopy;;   HOT HEMOSTASIS  03/26/2023   Procedure: HOT HEMOSTASIS (ARGON PLASMA COAGULATION/BICAP);  Surgeon: Midge Minium, MD;  Location: [facility] ENDOSCOPY;  Service: Endoscopy;;    FAMILY HISTORY: Reviewed and unchanged. No reported history of malignancy or chronic disease.     ADVANCED DIRECTIVES:    HEALTH MAINTENANCE: Social History   Tobacco Use   Smoking status:  Former   Smokeless tobacco: Never  Advertising account planner   Vaping status: Never Used  Substance Use Topics   Alcohol use: No    Drug use: No     Colonoscopy:  PAP:  Bone density:  Lipid panel:  Allergies  Allergen Reactions   Atorvastatin Other (See Comments)   Codeine    Demerol [Meperidine]    Naproxen Rash    Caused rash on mouth    Current Outpatient Medications  Medication Sig Dispense Refill   allopurinol (ZYLOPRIM) 100 MG tablet Take 0.5 tablets (50 mg total) by mouth daily. 15 tablet 0   aspirin EC 81 MG tablet Take 1 tablet (81 mg total) by mouth daily. Swallow whole. 30 tablet 12   carvedilol (COREG) 3.125 MG tablet Take 1 tablet (3.125 mg total) by mouth 2 (two) times daily with a meal. 60 tablet 0   Dextromethorphan-guaiFENesin 20-200 MG/20ML LIQD Take 10 mLs by mouth every 4 (four) hours as needed. 118 mL 0   ferrous sulfate 325 (65 FE) MG tablet Take 325 mg by mouth daily.     gabapentin (NEURONTIN) 100 MG capsule Take 100 mg by mouth at bedtime.     isosorbide mononitrate (IMDUR) 60 MG 24 hr tablet Take by mouth daily.     Melatonin 10 MG TABS Take 20 mg by mouth at bedtime.     Multiple Vitamins-Minerals (MULTIVITAMIN WITH MINERALS) tablet Take 1 tablet by mouth daily.     omeprazole (PRILOSEC) 20 MG capsule Take 20 mg by mouth daily.     oseltamivir (TAMIFLU) 30 MG capsule Take 1 capsule (30 mg total) by mouth daily for 2 days. 2 capsule 0   pramipexole (MIRAPEX) 0.25 MG tablet Take 0.25 mg by mouth in the morning and at bedtime.     rosuvastatin (CRESTOR) 5 MG tablet Take 5 mg by mouth at bedtime.     traZODone (DESYREL) 50 MG tablet Take 50 mg by mouth at bedtime.     furosemide (LASIX) 40 MG tablet Take 1 tablet (40 mg total) by mouth 2 (two) times daily. 60 tablet 0   neomycin-polymyxin b-dexamethasone (MAXITROL) 3.5-10000-0.1 SUSP Place 1 drop into the right eye 3 (three) times daily. (Patient not taking: Reported on 10/09/2023)     nitroGLYCERIN (NITROSTAT) 0.4 MG SL tablet Place 1 tablet (0.4 mg total) under the tongue every 5 (five) minutes as needed for chest pain. (Patient not  taking: Reported on 10/09/2023) 20 tablet 12   No current facility-administered medications for this visit.   Facility-Administered Medications Ordered in Other Visits  Medication Dose Route Frequency Provider Last Rate Last Admin   0.9 %  sodium chloride infusion   Intravenous PRN Rushie Chestnut, PA-C 10 mL/hr at 03/26/23 1110 Continued from Pre-op at 03/26/23 1110    OBJECTIVE: Vitals:   10/15/23 1037  BP: (!) 120/50  Pulse: 66  Resp: 16  Temp: (!) 96.8 F (36 C)  SpO2: 99%     Body mass index is 26.26 kg/m.    ECOG FS:0 - Asymptomatic  General: Well-developed, well-nourished, no acute distress. Eyes: Pink conjunctiva, anicteric sclera. HEENT: Normocephalic, moist mucous membranes. Lungs: No audible wheezing or coughing. Heart: Regular rate and rhythm. Abdomen: Soft, nontender, no obvious distention. Musculoskeletal: No edema, cyanosis, or clubbing. Neuro: Alert, answering all questions appropriately. Cranial nerves grossly intact. Skin: No rashes or petechiae noted. Psych: Normal affect.  LAB RESULTS:  Lab Results  Component Value Date   NA 136 10/12/2023   K  4.6 10/12/2023   CL 103 10/12/2023   CO2 23 10/12/2023   GLUCOSE 228 (H) 10/12/2023   BUN 48 (H) 10/12/2023   CREATININE 1.77 (H) 10/12/2023   CALCIUM 9.7 10/12/2023   PROT 5.8 (L) 10/10/2023   ALBUMIN 2.9 (L) 10/10/2023   AST 14 (L) 10/10/2023   ALT 11 10/10/2023   ALKPHOS 57 10/10/2023   BILITOT 0.5 10/10/2023   GFRNONAA 26 (L) 10/12/2023   GFRAA >60 02/17/2015    Lab Results  Component Value Date   WBC 8.6 10/15/2023   NEUTROABS 7.2 10/15/2023   HGB 10.1 (L) 10/15/2023   HCT 32.5 (L) 10/15/2023   MCV 104.5 (H) 10/15/2023   PLT 225 10/15/2023   Lab Results  Component Value Date   IRON 282 (H) 10/06/2023   TIBC 356 10/06/2023   IRONPCTSAT 79 (H) 10/06/2023   Lab Results  Component Value Date   FERRITIN 25 10/06/2023     STUDIES: CT CHEST WO CONTRAST Result Date:  10/09/2023 CLINICAL DATA:  Respiratory illness EXAM: CT CHEST WITHOUT CONTRAST TECHNIQUE: Multidetector CT imaging of the chest was performed following the standard protocol without IV contrast. RADIATION DOSE REDUCTION: This exam was performed according to the departmental dose-optimization program which includes automated exposure control, adjustment of the mA and/or kV according to patient size and/or use of iterative reconstruction technique. COMPARISON:  Chest x-ray 10/09/2023, CT chest 06/13/2022 FINDINGS: Cardiovascular: Limited evaluation without intravenous contrast. Advanced aortic atherosclerosis. No aneurysm. Coronary vascular calcification. Cardiomegaly. No pericardial effusion Mediastinum/Nodes: Patent trachea. No suspicious thyroid mass. Calcified mediastinal lymph nodes consistent with prior granulomatous disease. Stable mildly enlarged mediastinal nodes. AP window lymph node measures 10 mm compared with 10 mm previously. Precarinal node measures 14 mm compared with 15 mm previously. Esophagus within normal limits Lungs/Pleura: Emphysema. Streaky linear areas of atelectasis or scarring. No confluent airspace disease. Minimal ground-glass density at the left greater than right lung base. Upper Abdomen: No acute finding Musculoskeletal: No acute osseous abnormality IMPRESSION: 1. Emphysema. Minimal ground-glass density at the left greater than right lung base suspected to be inflammatory. No confluent airspace disease is seen. 2. Cardiomegaly Aortic Atherosclerosis (ICD10-I70.0) and Emphysema (ICD10-J43.9). Electronically Signed   By: Jasmine Pang M.D.   On: 10/09/2023 22:46   DG Chest Portable 1 View Result Date: 10/09/2023 CLINICAL DATA:  Shortness of breath and weakness. New diagnosis of influenza EXAM: PORTABLE CHEST 1 VIEW COMPARISON:  Chest radiograph dated 10/08/2023 FINDINGS: Normal lung volumes. Slightly increased bilateral lower lung interstitial and patchy opacities. No pleural effusion  or pneumothorax. Similar enlarged cardiomediastinal silhouette. No acute osseous abnormality. IMPRESSION: Slightly increased bilateral lower lung interstitial and patchy opacities, which may represent atypical infection or pulmonary edema. Electronically Signed   By: Agustin Cree M.D.   On: 10/09/2023 11:22   DG Chest Portable 1 View Result Date: 10/08/2023 CLINICAL DATA:  Weakness. EXAM: PORTABLE CHEST 1 VIEW COMPARISON:  03/26/2023. FINDINGS: Redemonstration of increased interstitial markings, similar to prior study. There are asymmetric more interstitial markings on the right than left. Findings are nonspecific and differential diagnosis include underlying emphysema, interstitial lung disease, asymmetric pulmonary edema, etc. Correlate clinically. Bilateral lung fields are clear. No dense consolidation or lung collapse. Bilateral costophrenic angles are clear. Stable cardio-mediastinal silhouette. No acute osseous abnormalities. The soft tissues are within normal limits. IMPRESSION: *Redemonstration of increased interstitial markings, similar to prior study. There are asymmetric more interstitial markings on the right than left. Findings are nonspecific and differential diagnosis include underlying emphysema, interstitial  lung disease, asymmetric pulmonary edema, etc. Electronically Signed   By: Jules Schick M.D.   On: 10/08/2023 14:25    ASSESSMENT: Iron deficiency anemia.  PLAN:    Iron deficiency anemia: EGD on March 26, 2023 revealed an actively bleeding duodenal lesion which was cauterized at the time achieving hemostasis.  Patient's hemoglobin has improved to 10.1 with blood transfusion last week.  Iron stores are within normal limits.  She feels symptomatically improved as well.  She does not require additional transfusion at this time.  Return to clinic in 2 weeks with repeat laboratory work and further evaluation.   GI bleed: Follow-up with GI for repeat luminal evaluation as needed.  I  spent a total of 20 minutes reviewing chart data, face-to-face evaluation with the patient, counseling and coordination of care as detailed above.   Patient expressed understanding and was in agreement with this plan. She also understands that She can call clinic at any time with any questions, concerns, or complaints.    Jeralyn Ruths, MD   10/15/2023 10:55 AM

## 2023-10-16 ENCOUNTER — Inpatient Hospital Stay: Payer: Medicare HMO

## 2023-10-20 ENCOUNTER — Encounter: Payer: Self-pay | Admitting: Oncology

## 2023-10-29 ENCOUNTER — Inpatient Hospital Stay

## 2023-10-29 ENCOUNTER — Inpatient Hospital Stay: Admitting: Oncology

## 2023-10-29 ENCOUNTER — Encounter: Payer: Self-pay | Admitting: Oncology

## 2023-10-29 VITALS — BP 128/67 | HR 67 | Temp 96.8°F | Resp 16 | Ht 61.0 in | Wt 142.7 lb

## 2023-10-29 DIAGNOSIS — D509 Iron deficiency anemia, unspecified: Secondary | ICD-10-CM

## 2023-10-29 LAB — CBC WITH DIFFERENTIAL/PLATELET
Abs Immature Granulocytes: 0.03 10*3/uL (ref 0.00–0.07)
Basophils Absolute: 0.1 10*3/uL (ref 0.0–0.1)
Basophils Relative: 1 %
Eosinophils Absolute: 0.3 10*3/uL (ref 0.0–0.5)
Eosinophils Relative: 6 %
HCT: 29.1 % — ABNORMAL LOW (ref 36.0–46.0)
Hemoglobin: 9.1 g/dL — ABNORMAL LOW (ref 12.0–15.0)
Immature Granulocytes: 1 %
Lymphocytes Relative: 12 %
Lymphs Abs: 0.6 10*3/uL — ABNORMAL LOW (ref 0.7–4.0)
MCH: 31.5 pg (ref 26.0–34.0)
MCHC: 31.3 g/dL (ref 30.0–36.0)
MCV: 100.7 fL — ABNORMAL HIGH (ref 80.0–100.0)
Monocytes Absolute: 0.3 10*3/uL (ref 0.1–1.0)
Monocytes Relative: 5 %
Neutro Abs: 4.3 10*3/uL (ref 1.7–7.7)
Neutrophils Relative %: 75 %
Platelets: 199 10*3/uL (ref 150–400)
RBC: 2.89 MIL/uL — ABNORMAL LOW (ref 3.87–5.11)
RDW: 12.9 % (ref 11.5–15.5)
WBC: 5.6 10*3/uL (ref 4.0–10.5)
nRBC: 0 % (ref 0.0–0.2)

## 2023-10-29 LAB — IRON AND TIBC
Iron: 120 ug/dL (ref 28–170)
Saturation Ratios: 31 % (ref 10.4–31.8)
TIBC: 385 ug/dL (ref 250–450)
UIBC: 265 ug/dL

## 2023-10-29 LAB — SAMPLE TO BLOOD BANK

## 2023-10-29 NOTE — Progress Notes (Signed)
 [facility]  [phone] [phone]  ID: Lisa Mcneil OB: [birth year]  MR#: [id number]  WUX#:[id number]  Patient Care Team: Marisue Ivan, MD as PCP - General (Family Medicine) Jeralyn Ruths, MD as Consulting Physician (Hematology and Oncology)  CHIEF COMPLAINT:  Iron deficiency anemia.  INTERVAL HISTORY: Patient returns to clinic today for repeat laboratory, further evaluation, and consideration of additional blood transfusion.  She currently feels well and is asymptomatic.  She does not complain of any weakness or fatigue today.  She remains active and works every Monday, Wednesday, and Friday at the [employer] in [place].  She has no neurologic complaints. She denies any recent fevers or illnesses. She has a good appetite and denies weight loss.  She denies any chest pain, shortness of breath, cough, or hemoptysis.  She denies any nausea, vomiting, consultation, or diarrhea. She has no melena or hematochezia.  She has no urinary complaints.  Patient offers no specific complaints today.  REVIEW OF SYSTEMS:   Review of Systems  Constitutional: Negative.  Negative for fever, malaise/fatigue and weight loss.  Respiratory: Negative.  Negative for cough and shortness of breath.   Cardiovascular: Negative.  Negative for chest pain and leg swelling.  Gastrointestinal: Negative.  Negative for abdominal pain, blood in stool and melena.  Genitourinary: Negative.  Negative for hematuria.  Musculoskeletal: Negative.  Negative for back pain.  Skin: Negative.  Negative for rash.  Neurological: Negative.  Negative for dizziness, focal weakness, weakness and headaches.  Psychiatric/Behavioral: Negative.  The patient is not nervous/anxious.     As per HPI. Otherwise, a complete review of systems is negative.  PAST MEDICAL HISTORY: Past Medical History:  Diagnosis Date   CHF (congestive heart failure) (HCC)    Diabetes mellitus without  complication (HCC)    GERD (gastroesophageal reflux disease)    Hypertension    Insomnia    Iron deficiency anemia    RLS (restless legs syndrome)     PAST SURGICAL HISTORY: Past Surgical History:  Procedure Laterality Date   BLADDER REPAIR     CATARACT EXTRACTION W/PHACO Right 05/13/2016   Procedure: CATARACT EXTRACTION PHACO AND INTRAOCULAR LENS PLACEMENT (IOC);  Surgeon: Galen Manila, MD;  Location: [facility] ORS;  Service: Ophthalmology;  Laterality: Right;  [place] 1.09 AP% 21.0 CDE 14.60 Fluid Pack Lot # [id number]   CATARACT EXTRACTION W/PHACO Left 07/15/2016   Procedure: CATARACT EXTRACTION PHACO AND INTRAOCULAR LENS PLACEMENT (IOC);  Surgeon: Galen Manila, MD;  Location: [facility] ORS;  Service: Ophthalmology;  Laterality: Left;  Lot# [id number] H [place]: 01:17.9 AP%:27.8 CDE: 21.67    ESOPHAGOGASTRODUODENOSCOPY (EGD) WITH PROPOFOL N/A 03/26/2023   Procedure: ESOPHAGOGASTRODUODENOSCOPY (EGD) WITH PROPOFOL;  Surgeon: Midge Minium, MD;  Location: [facility] ENDOSCOPY;  Service: Endoscopy;  Laterality: N/A;   HEMOSTASIS CLIP PLACEMENT  03/26/2023   Procedure: HEMOSTASIS CLIP PLACEMENT;  Surgeon: Midge Minium, MD;  Location: [facility] ENDOSCOPY;  Service: Endoscopy;;   HEMOSTASIS CONTROL  03/26/2023   Procedure: HEMOSTASIS CONTROL;  Surgeon: Midge Minium, MD;  Location: [facility] ENDOSCOPY;  Service: Endoscopy;;   HOT HEMOSTASIS  03/26/2023   Procedure: HOT HEMOSTASIS (ARGON PLASMA COAGULATION/BICAP);  Surgeon: Midge Minium, MD;  Location: [facility] ENDOSCOPY;  Service: Endoscopy;;    FAMILY HISTORY: Reviewed and unchanged. No reported history of malignancy or chronic disease.     ADVANCED DIRECTIVES:    HEALTH MAINTENANCE: Social History   Tobacco Use   Smoking status: Former   Smokeless tobacco: Never  Vaping Use   Vaping status: Never Used  Substance  Use Topics   Alcohol use: No   Drug use: No     Colonoscopy:  PAP:  Bone density:  Lipid panel:  Allergies  Allergen Reactions   Atorvastatin  Other (See Comments)   Codeine    Demerol [Meperidine]    Naproxen Rash    Caused rash on mouth    Current Outpatient Medications  Medication Sig Dispense Refill   allopurinol (ZYLOPRIM) 100 MG tablet Take 0.5 tablets (50 mg total) by mouth daily. 15 tablet 0   aspirin EC 81 MG tablet Take 1 tablet (81 mg total) by mouth daily. Swallow whole. 30 tablet 12   carvedilol (COREG) 3.125 MG tablet Take 1 tablet (3.125 mg total) by mouth 2 (two) times daily with a meal. 60 tablet 0   Dextromethorphan-guaiFENesin 20-200 MG/20ML LIQD Take 10 mLs by mouth every 4 (four) hours as needed. 118 mL 0   ferrous sulfate 325 (65 FE) MG tablet Take 325 mg by mouth daily.     gabapentin (NEURONTIN) 100 MG capsule Take 100 mg by mouth at bedtime.     isosorbide mononitrate (IMDUR) 60 MG 24 hr tablet Take by mouth daily.     Melatonin 10 MG TABS Take 20 mg by mouth at bedtime.     Multiple Vitamins-Minerals (MULTIVITAMIN WITH MINERALS) tablet Take 1 tablet by mouth daily.     omeprazole (PRILOSEC) 20 MG capsule Take 20 mg by mouth daily.     pramipexole (MIRAPEX) 0.25 MG tablet Take 0.25 mg by mouth in the morning and at bedtime.     rosuvastatin (CRESTOR) 5 MG tablet Take 5 mg by mouth at bedtime.     traZODone (DESYREL) 50 MG tablet Take 50 mg by mouth at bedtime.     furosemide (LASIX) 40 MG tablet Take 1 tablet (40 mg total) by mouth 2 (two) times daily. 60 tablet 0   neomycin-polymyxin b-dexamethasone (MAXITROL) 3.5-10000-0.1 SUSP Place 1 drop into the right eye 3 (three) times daily. (Patient not taking: Reported on 10/09/2023)     nitroGLYCERIN (NITROSTAT) 0.4 MG SL tablet Place 1 tablet (0.4 mg total) under the tongue every 5 (five) minutes as needed for chest pain. (Patient not taking: Reported on 10/09/2023) 20 tablet 12   No current facility-administered medications for this visit.   Facility-Administered Medications Ordered in Other Visits  Medication Dose Route Frequency Provider Last Rate Last  Admin   0.9 %  sodium chloride infusion   Intravenous PRN Rushie Chestnut, PA-C 10 mL/hr at 03/26/23 1110 Continued from Pre-op at 03/26/23 1110    OBJECTIVE: Vitals:   10/29/23 1034  BP: 128/67  Pulse: 67  Resp: 16  Temp: (!) 96.8 F (36 C)  SpO2: 100%     Body mass index is 26.96 kg/m.    ECOG FS:0 - Asymptomatic  General: Well-developed, well-nourished, no acute distress. Eyes: Pink conjunctiva, anicteric sclera. HEENT: Normocephalic, moist mucous membranes. Lungs: No audible wheezing or coughing. Heart: Regular rate and rhythm. Abdomen: Soft, nontender, no obvious distention. Musculoskeletal: No edema, cyanosis, or clubbing. Neuro: Alert, answering all questions appropriately. Cranial nerves grossly intact. Skin: No rashes or petechiae noted. Psych: Normal affect.  LAB RESULTS:  Lab Results  Component Value Date   NA 136 10/12/2023   K 4.6 10/12/2023   CL 103 10/12/2023   CO2 23 10/12/2023   GLUCOSE 228 (H) 10/12/2023   BUN 48 (H) 10/12/2023   CREATININE 1.77 (H) 10/12/2023   CALCIUM 9.7 10/12/2023   PROT 5.8 (L)  10/10/2023   ALBUMIN 2.9 (L) 10/10/2023   AST 14 (L) 10/10/2023   ALT 11 10/10/2023   ALKPHOS 57 10/10/2023   BILITOT 0.5 10/10/2023   GFRNONAA 26 (L) 10/12/2023   GFRAA >60 02/17/2015    Lab Results  Component Value Date   WBC 5.6 10/29/2023   NEUTROABS 4.3 10/29/2023   HGB 9.1 (L) 10/29/2023   HCT 29.1 (L) 10/29/2023   MCV 100.7 (H) 10/29/2023   PLT 199 10/29/2023   Lab Results  Component Value Date   IRON 120 10/29/2023   TIBC 385 10/29/2023   IRONPCTSAT 31 10/29/2023   Lab Results  Component Value Date   FERRITIN 25 10/15/2023     STUDIES: CT CHEST WO CONTRAST Result Date: 10/09/2023 CLINICAL DATA:  Respiratory illness EXAM: CT CHEST WITHOUT CONTRAST TECHNIQUE: Multidetector CT imaging of the chest was performed following the standard protocol without IV contrast. RADIATION DOSE REDUCTION: This exam was performed according to  the departmental dose-optimization program which includes automated exposure control, adjustment of the mA and/or kV according to patient size and/or use of iterative reconstruction technique. COMPARISON:  Chest x-ray 10/09/2023, CT chest 06/13/2022 FINDINGS: Cardiovascular: Limited evaluation without intravenous contrast. Advanced aortic atherosclerosis. No aneurysm. Coronary vascular calcification. Cardiomegaly. No pericardial effusion Mediastinum/Nodes: Patent trachea. No suspicious thyroid mass. Calcified mediastinal lymph nodes consistent with prior granulomatous disease. Stable mildly enlarged mediastinal nodes. AP window lymph node measures 10 mm compared with 10 mm previously. Precarinal node measures 14 mm compared with 15 mm previously. Esophagus within normal limits Lungs/Pleura: Emphysema. Streaky linear areas of atelectasis or scarring. No confluent airspace disease. Minimal ground-glass density at the left greater than right lung base. Upper Abdomen: No acute finding Musculoskeletal: No acute osseous abnormality IMPRESSION: 1. Emphysema. Minimal ground-glass density at the left greater than right lung base suspected to be inflammatory. No confluent airspace disease is seen. 2. Cardiomegaly Aortic Atherosclerosis (ICD10-I70.0) and Emphysema (ICD10-J43.9). Electronically Signed   By: Jasmine Pang M.D.   On: 10/09/2023 22:46   DG Chest Portable 1 View Result Date: 10/09/2023 CLINICAL DATA:  Shortness of breath and weakness. New diagnosis of influenza EXAM: PORTABLE CHEST 1 VIEW COMPARISON:  Chest radiograph dated 10/08/2023 FINDINGS: Normal lung volumes. Slightly increased bilateral lower lung interstitial and patchy opacities. No pleural effusion or pneumothorax. Similar enlarged cardiomediastinal silhouette. No acute osseous abnormality. IMPRESSION: Slightly increased bilateral lower lung interstitial and patchy opacities, which may represent atypical infection or pulmonary edema. Electronically  Signed   By: Agustin Cree M.D.   On: 10/09/2023 11:22   DG Chest Portable 1 View Result Date: 10/08/2023 CLINICAL DATA:  Weakness. EXAM: PORTABLE CHEST 1 VIEW COMPARISON:  03/26/2023. FINDINGS: Redemonstration of increased interstitial markings, similar to prior study. There are asymmetric more interstitial markings on the right than left. Findings are nonspecific and differential diagnosis include underlying emphysema, interstitial lung disease, asymmetric pulmonary edema, etc. Correlate clinically. Bilateral lung fields are clear. No dense consolidation or lung collapse. Bilateral costophrenic angles are clear. Stable cardio-mediastinal silhouette. No acute osseous abnormalities. The soft tissues are within normal limits. IMPRESSION: *Redemonstration of increased interstitial markings, similar to prior study. There are asymmetric more interstitial markings on the right than left. Findings are nonspecific and differential diagnosis include underlying emphysema, interstitial lung disease, asymmetric pulmonary edema, etc. Electronically Signed   By: Jules Schick M.D.   On: 10/08/2023 14:25    ASSESSMENT: Iron deficiency anemia.  PLAN:    Iron deficiency anemia: EGD on March 26, 2023 revealed an  actively bleeding duodenal lesion which was cauterized at the time achieving hemostasis.  Patient's hemoglobin has trended down to 9.1, but she remains asymptomatic.  Iron stores are within normal limits.  She does not require transfusion today, but may need 1 in the near future.  Return to clinic in 2 to 3 weeks for repeat laboratory work, further evaluation, consideration of additional blood if necessary.   GI bleed: Although patient denies any melena or hematochezia, have sent a referral back to GI for consideration of repeat luminal evaluation.    I spent a total of 20 minutes reviewing chart data, face-to-face evaluation with the patient, counseling and coordination of care as detailed above.   Patient  expressed understanding and was in agreement with this plan. She also understands that She can call clinic at any time with any questions, concerns, or complaints.    Jeralyn Ruths, MD   10/29/2023 3:10 PM

## 2023-10-30 ENCOUNTER — Inpatient Hospital Stay

## 2023-11-17 ENCOUNTER — Inpatient Hospital Stay: Admitting: Oncology

## 2023-11-17 ENCOUNTER — Inpatient Hospital Stay

## 2023-11-19 ENCOUNTER — Inpatient Hospital Stay

## 2023-11-24 ENCOUNTER — Other Ambulatory Visit: Payer: Self-pay | Admitting: *Deleted

## 2023-11-24 ENCOUNTER — Inpatient Hospital Stay: Admitting: Oncology

## 2023-11-24 ENCOUNTER — Telehealth: Payer: Self-pay

## 2023-11-24 ENCOUNTER — Inpatient Hospital Stay: Attending: Oncology

## 2023-11-24 VITALS — BP 170/63 | HR 81 | Temp 96.4°F | Resp 16 | Wt 142.7 lb

## 2023-11-24 DIAGNOSIS — K264 Chronic or unspecified duodenal ulcer with hemorrhage: Secondary | ICD-10-CM | POA: Insufficient documentation

## 2023-11-24 DIAGNOSIS — R5383 Other fatigue: Secondary | ICD-10-CM | POA: Diagnosis not present

## 2023-11-24 DIAGNOSIS — Z87891 Personal history of nicotine dependence: Secondary | ICD-10-CM | POA: Insufficient documentation

## 2023-11-24 DIAGNOSIS — D509 Iron deficiency anemia, unspecified: Secondary | ICD-10-CM

## 2023-11-24 LAB — CBC WITH DIFFERENTIAL/PLATELET
Abs Immature Granulocytes: 0.02 10*3/uL (ref 0.00–0.07)
Basophils Absolute: 0.1 10*3/uL (ref 0.0–0.1)
Basophils Relative: 1 %
Eosinophils Absolute: 0.3 10*3/uL (ref 0.0–0.5)
Eosinophils Relative: 5 %
HCT: 26 % — ABNORMAL LOW (ref 36.0–46.0)
Hemoglobin: 8 g/dL — ABNORMAL LOW (ref 12.0–15.0)
Immature Granulocytes: 0 %
Lymphocytes Relative: 16 %
Lymphs Abs: 1 10*3/uL (ref 0.7–4.0)
MCH: 32.3 pg (ref 26.0–34.0)
MCHC: 30.8 g/dL (ref 30.0–36.0)
MCV: 104.8 fL — ABNORMAL HIGH (ref 80.0–100.0)
Monocytes Absolute: 0.4 10*3/uL (ref 0.1–1.0)
Monocytes Relative: 7 %
Neutro Abs: 4.3 10*3/uL (ref 1.7–7.7)
Neutrophils Relative %: 71 %
Platelets: 200 10*3/uL (ref 150–400)
RBC: 2.48 MIL/uL — ABNORMAL LOW (ref 3.87–5.11)
RDW: 14.9 % (ref 11.5–15.5)
WBC: 6 10*3/uL (ref 4.0–10.5)
nRBC: 0 % (ref 0.0–0.2)

## 2023-11-24 LAB — SAMPLE TO BLOOD BANK

## 2023-11-24 LAB — PREPARE RBC (CROSSMATCH)

## 2023-11-24 LAB — IRON AND TIBC
Iron: 276 ug/dL — ABNORMAL HIGH (ref 28–170)
Saturation Ratios: 65 % — ABNORMAL HIGH (ref 10.4–31.8)
TIBC: 426 ug/dL (ref 250–450)
UIBC: 150 ug/dL

## 2023-11-24 NOTE — Progress Notes (Signed)
 [facility]  Telephone:(336) [phone] Fax:(336) [phone]  ID: Sundee Garland OB: 07-Aug-1928  MR#: [id number]  AOZ#:[id number]  Patient Care Team: Marisue Ivan, MD as PCP - General (Family Medicine) Jeralyn Ruths, MD as Consulting Physician (Hematology and Oncology)  CHIEF COMPLAINT:  Iron deficiency anemia.  INTERVAL HISTORY: Patient returns to clinic today for repeat laboratory, further evaluation, consideration of additional blood transfusion.  She continues to feel well and remains asymptomatic.  She denies any weakness or fatigue.  She remains active and works every Monday, Wednesday, and Friday at the [employer] in [place].  She has no neurologic complaints. She denies any recent fevers or illnesses. She has a good appetite and denies weight loss.  She denies any chest pain, shortness of breath, cough, or hemoptysis.  She denies any nausea, vomiting, consultation, or diarrhea. She has no melena or hematochezia.  She has no urinary complaints.  Patient offers no specific complaints today.  REVIEW OF SYSTEMS:   Review of Systems  Constitutional: Negative.  Negative for fever, malaise/fatigue and weight loss.  Respiratory: Negative.  Negative for cough and shortness of breath.   Cardiovascular: Negative.  Negative for chest pain and leg swelling.  Gastrointestinal: Negative.  Negative for abdominal pain, blood in stool and melena.  Genitourinary: Negative.  Negative for hematuria.  Musculoskeletal: Negative.  Negative for back pain.  Skin: Negative.  Negative for rash.  Neurological: Negative.  Negative for dizziness, focal weakness, weakness and headaches.  Psychiatric/Behavioral: Negative.  The patient is not nervous/anxious.     As per HPI. Otherwise, a complete review of systems is negative.  PAST MEDICAL HISTORY: Past Medical History:  Diagnosis Date   CHF (congestive heart failure) (HCC)    Diabetes mellitus without complication (HCC)     GERD (gastroesophageal reflux disease)    Hypertension    Insomnia    Iron deficiency anemia    RLS (restless legs syndrome)     PAST SURGICAL HISTORY: Past Surgical History:  Procedure Laterality Date   BLADDER REPAIR     CATARACT EXTRACTION W/PHACO Right 05/13/2016   Procedure: CATARACT EXTRACTION PHACO AND INTRAOCULAR LENS PLACEMENT (IOC);  Surgeon: Galen Manila, MD;  Location: [facility] ORS;  Service: Ophthalmology;  Laterality: Right;  [place] 1.09 AP% 21.0 CDE 14.60 Fluid Pack Lot # [id number]   CATARACT EXTRACTION W/PHACO Left 07/15/2016   Procedure: CATARACT EXTRACTION PHACO AND INTRAOCULAR LENS PLACEMENT (IOC);  Surgeon: Galen Manila, MD;  Location: [facility] ORS;  Service: Ophthalmology;  Laterality: Left;  Lot# [id number] H [place]: 01:17.9 AP%:27.8 CDE: 21.67    ESOPHAGOGASTRODUODENOSCOPY (EGD) WITH PROPOFOL N/A 03/26/2023   Procedure: ESOPHAGOGASTRODUODENOSCOPY (EGD) WITH PROPOFOL;  Surgeon: Midge Minium, MD;  Location: [facility] ENDOSCOPY;  Service: Endoscopy;  Laterality: N/A;   HEMOSTASIS CLIP PLACEMENT  03/26/2023   Procedure: HEMOSTASIS CLIP PLACEMENT;  Surgeon: Midge Minium, MD;  Location: [facility] ENDOSCOPY;  Service: Endoscopy;;   HEMOSTASIS CONTROL  03/26/2023   Procedure: HEMOSTASIS CONTROL;  Surgeon: Midge Minium, MD;  Location: [facility] ENDOSCOPY;  Service: Endoscopy;;   HOT HEMOSTASIS  03/26/2023   Procedure: HOT HEMOSTASIS (ARGON PLASMA COAGULATION/BICAP);  Surgeon: Midge Minium, MD;  Location: [facility] ENDOSCOPY;  Service: Endoscopy;;    FAMILY HISTORY: Reviewed and unchanged. No reported history of malignancy or chronic disease.     ADVANCED DIRECTIVES:    HEALTH MAINTENANCE: Social History   Tobacco Use   Smoking status: Former   Smokeless tobacco: Never  Vaping Use   Vaping status: Never Used  Substance Use Topics  Alcohol use: No   Drug use: No     Colonoscopy:  PAP:  Bone density:  Lipid panel:  Allergies  Allergen Reactions   Atorvastatin Other (See Comments)    Codeine    Demerol [Meperidine]    Naproxen Rash    Caused rash on mouth    Current Outpatient Medications  Medication Sig Dispense Refill   allopurinol (ZYLOPRIM) 100 MG tablet Take 0.5 tablets (50 mg total) by mouth daily. 15 tablet 0   aspirin EC 81 MG tablet Take 1 tablet (81 mg total) by mouth daily. Swallow whole. 30 tablet 12   carvedilol (COREG) 3.125 MG tablet Take 1 tablet (3.125 mg total) by mouth 2 (two) times daily with a meal. 60 tablet 0   ferrous sulfate 325 (65 FE) MG tablet Take 325 mg by mouth daily.     furosemide (LASIX) 40 MG tablet Take 1 tablet (40 mg total) by mouth 2 (two) times daily. 60 tablet 0   gabapentin (NEURONTIN) 100 MG capsule Take 100 mg by mouth at bedtime.     isosorbide mononitrate (IMDUR) 60 MG 24 hr tablet Take by mouth daily.     Melatonin 10 MG TABS Take 20 mg by mouth at bedtime.     Multiple Vitamins-Minerals (MULTIVITAMIN WITH MINERALS) tablet Take 1 tablet by mouth daily.     omeprazole (PRILOSEC) 20 MG capsule Take 20 mg by mouth daily.     pramipexole (MIRAPEX) 0.25 MG tablet Take 0.25 mg by mouth in the morning and at bedtime.     rosuvastatin (CRESTOR) 5 MG tablet Take 5 mg by mouth at bedtime.     traZODone (DESYREL) 50 MG tablet Take 50 mg by mouth at bedtime.     Dextromethorphan-guaiFENesin 20-200 MG/20ML LIQD Take 10 mLs by mouth every 4 (four) hours as needed. (Patient not taking: Reported on 11/24/2023) 118 mL 0   neomycin-polymyxin b-dexamethasone (MAXITROL) 3.5-10000-0.1 SUSP Place 1 drop into the right eye 3 (three) times daily. (Patient not taking: Reported on 10/09/2023)     nitroGLYCERIN (NITROSTAT) 0.4 MG SL tablet Place 1 tablet (0.4 mg total) under the tongue every 5 (five) minutes as needed for chest pain. (Patient not taking: Reported on 10/09/2023) 20 tablet 12   No current facility-administered medications for this visit.   Facility-Administered Medications Ordered in Other Visits  Medication Dose Route Frequency  Provider Last Rate Last Admin   0.9 %  sodium chloride infusion   Intravenous PRN Rushie Chestnut, PA-C 10 mL/hr at 03/26/23 1110 Continued from Pre-op at 03/26/23 1110    OBJECTIVE: Vitals:   11/24/23 1347  BP: (!) 170/63  Pulse: 81  Resp: 16  Temp: (!) 96.4 F (35.8 C)      Body mass index is 26.96 kg/m.    ECOG FS:0 - Asymptomatic  General: Well-developed, well-nourished, no acute distress. Eyes: Pink conjunctiva, anicteric sclera. HEENT: Normocephalic, moist mucous membranes. Lungs: No audible wheezing or coughing. Heart: Regular rate and rhythm. Abdomen: Soft, nontender, no obvious distention. Musculoskeletal: No edema, cyanosis, or clubbing. Neuro: Alert, answering all questions appropriately. Cranial nerves grossly intact. Skin: No rashes or petechiae noted. Psych: Normal affect.  LAB RESULTS:  Lab Results  Component Value Date   NA 136 10/12/2023   K 4.6 10/12/2023   CL 103 10/12/2023   CO2 23 10/12/2023   GLUCOSE 228 (H) 10/12/2023   BUN 48 (H) 10/12/2023   CREATININE 1.77 (H) 10/12/2023   CALCIUM 9.7 10/12/2023   PROT 5.8 (  L) 10/10/2023   ALBUMIN 2.9 (L) 10/10/2023   AST 14 (L) 10/10/2023   ALT 11 10/10/2023   ALKPHOS 57 10/10/2023   BILITOT 0.5 10/10/2023   GFRNONAA 26 (L) 10/12/2023   GFRAA >60 02/17/2015    Lab Results  Component Value Date   WBC 6.0 11/24/2023   NEUTROABS 4.3 11/24/2023   HGB 8.0 (L) 11/24/2023   HCT 26.0 (L) 11/24/2023   MCV 104.8 (H) 11/24/2023   PLT 200 11/24/2023   Lab Results  Component Value Date   IRON 120 10/29/2023   TIBC 385 10/29/2023   IRONPCTSAT 31 10/29/2023   Lab Results  Component Value Date   FERRITIN 25 10/15/2023     STUDIES: No results found.   ASSESSMENT: Iron deficiency anemia.  PLAN:    Iron deficiency anemia: EGD on March 26, 2023 revealed an actively bleeding duodenal lesion which was cauterized at the time achieving hemostasis.  Over the past 3 weeks, patient's hemoglobin has  trended down to 8.0.  Iron stores continue to be within normal limits.  Suspect patient is still bleeding and a referral has been sent back to GI.  Return to clinic on Thursday for 1 unit packed red blood cells.  Given multiple transfusions, will irradiate all blood products.  Return to clinic in 3 weeks for laboratory work and consideration of additional transfusion. GI bleed: Although patient denies any melena or hematochezia, suspect patient still has active GI bleed and a referral has been sent to GI.  I spent a total of 30 minutes reviewing chart data, face-to-face evaluation with the patient, counseling and coordination of care as detailed above.    Patient expressed understanding and was in agreement with this plan. She also understands that She can call clinic at any time with any questions, concerns, or complaints.    Shellie Dials, MD   11/24/2023 2:17 PM

## 2023-11-24 NOTE — Telephone Encounter (Signed)
 Referral faxed over to [facility] to rule out possible GI bleed.

## 2023-11-25 ENCOUNTER — Encounter: Payer: Self-pay | Admitting: Oncology

## 2023-11-26 ENCOUNTER — Inpatient Hospital Stay

## 2023-11-26 DIAGNOSIS — D509 Iron deficiency anemia, unspecified: Secondary | ICD-10-CM | POA: Diagnosis not present

## 2023-11-26 MED ORDER — ACETAMINOPHEN 325 MG PO TABS
650.0000 mg | ORAL_TABLET | Freq: Once | ORAL | Status: AC
  Administered 2023-11-26: 650 mg via ORAL
  Filled 2023-11-26: qty 2

## 2023-11-26 MED ORDER — DIPHENHYDRAMINE HCL 50 MG/ML IJ SOLN
25.0000 mg | Freq: Once | INTRAMUSCULAR | Status: AC
  Administered 2023-11-26: 25 mg via INTRAVENOUS
  Filled 2023-11-26: qty 1

## 2023-11-26 MED ORDER — SODIUM CHLORIDE 0.9% IV SOLUTION
250.0000 mL | INTRAVENOUS | Status: DC
Start: 2023-11-26 — End: 2023-11-26
  Administered 2023-11-26: 50 mL via INTRAVENOUS
  Filled 2023-11-26: qty 250

## 2023-11-27 LAB — BPAM RBC
Blood Product Expiration Date: 202505092359
ISSUE DATE / TIME: 202504171305
Unit Type and Rh: 6200

## 2023-11-27 LAB — TYPE AND SCREEN
ABO/RH(D): A POS
Antibody Screen: NEGATIVE
Unit division: 0

## 2023-12-08 ENCOUNTER — Encounter: Payer: Self-pay | Admitting: Oncology

## 2023-12-14 ENCOUNTER — Other Ambulatory Visit: Payer: Self-pay | Admitting: *Deleted

## 2023-12-14 DIAGNOSIS — D509 Iron deficiency anemia, unspecified: Secondary | ICD-10-CM

## 2023-12-15 ENCOUNTER — Inpatient Hospital Stay: Attending: Oncology

## 2023-12-15 ENCOUNTER — Inpatient Hospital Stay: Admitting: Oncology

## 2023-12-15 ENCOUNTER — Inpatient Hospital Stay

## 2023-12-15 ENCOUNTER — Encounter: Payer: Self-pay | Admitting: Oncology

## 2023-12-15 ENCOUNTER — Inpatient Hospital Stay (HOSPITAL_BASED_OUTPATIENT_CLINIC_OR_DEPARTMENT_OTHER): Admitting: Oncology

## 2023-12-15 VITALS — BP 147/58 | HR 70 | Temp 97.4°F | Resp 16 | Ht 61.0 in | Wt 141.6 lb

## 2023-12-15 DIAGNOSIS — K921 Melena: Secondary | ICD-10-CM | POA: Insufficient documentation

## 2023-12-15 DIAGNOSIS — D509 Iron deficiency anemia, unspecified: Secondary | ICD-10-CM

## 2023-12-15 DIAGNOSIS — I10 Essential (primary) hypertension: Secondary | ICD-10-CM | POA: Insufficient documentation

## 2023-12-15 DIAGNOSIS — Z87891 Personal history of nicotine dependence: Secondary | ICD-10-CM | POA: Diagnosis not present

## 2023-12-15 LAB — CBC WITH DIFFERENTIAL/PLATELET
Abs Immature Granulocytes: 0.02 10*3/uL (ref 0.00–0.07)
Basophils Absolute: 0.1 10*3/uL (ref 0.0–0.1)
Basophils Relative: 1 %
Eosinophils Absolute: 0.3 10*3/uL (ref 0.0–0.5)
Eosinophils Relative: 6 %
HCT: 26.7 % — ABNORMAL LOW (ref 36.0–46.0)
Hemoglobin: 8.6 g/dL — ABNORMAL LOW (ref 12.0–15.0)
Immature Granulocytes: 0 %
Lymphocytes Relative: 13 %
Lymphs Abs: 0.7 10*3/uL (ref 0.7–4.0)
MCH: 32.7 pg (ref 26.0–34.0)
MCHC: 32.2 g/dL (ref 30.0–36.0)
MCV: 101.5 fL — ABNORMAL HIGH (ref 80.0–100.0)
Monocytes Absolute: 0.3 10*3/uL (ref 0.1–1.0)
Monocytes Relative: 6 %
Neutro Abs: 4 10*3/uL (ref 1.7–7.7)
Neutrophils Relative %: 74 %
Platelets: 219 10*3/uL (ref 150–400)
RBC: 2.63 MIL/uL — ABNORMAL LOW (ref 3.87–5.11)
RDW: 14.5 % (ref 11.5–15.5)
WBC: 5.3 10*3/uL (ref 4.0–10.5)
nRBC: 0 % (ref 0.0–0.2)

## 2023-12-15 LAB — SAMPLE TO BLOOD BANK

## 2023-12-15 LAB — IRON AND TIBC
Iron: 138 ug/dL (ref 28–170)
Saturation Ratios: 36 % — ABNORMAL HIGH (ref 10.4–31.8)
TIBC: 388 ug/dL (ref 250–450)
UIBC: 250 ug/dL

## 2023-12-15 NOTE — Progress Notes (Signed)
 [facility]  Telephone:(336) [phone] Fax:(336) [phone]  ID: Milira Helman OB: 13-Apr-1928  MR#: [id number]  QIO#:[id number]  Patient Care Team: Monique Ano, MD as PCP - General (Family Medicine) Shellie Dials, MD as Consulting Physician (Hematology and Oncology)  CHIEF COMPLAINT:  Iron  deficiency anemia.  INTERVAL HISTORY: Patient returns to clinic today for repeat laboratory work, further evaluation, and consideration of additional blood transfusion.  She continues to feel well and remains asymptomatic.  She denies any weakness or fatigue.  She remains active and works every Monday, Wednesday, and Friday at the [employer] in [place].  She has no neurologic complaints. She denies any recent fevers or illnesses. She has a good appetite and denies weight loss.  She denies any chest pain, shortness of breath, cough, or hemoptysis.  She denies any nausea, vomiting, consultation, or diarrhea.  She continues to have dark stools, but reports she also takes iron  supplementation.  She has no urinary complaints.  Patient offers no further specific complaints today.  REVIEW OF SYSTEMS:   Review of Systems  Constitutional: Negative.  Negative for fever, malaise/fatigue and weight loss.  Respiratory: Negative.  Negative for cough and shortness of breath.   Cardiovascular: Negative.  Negative for chest pain and leg swelling.  Gastrointestinal: Negative.  Negative for abdominal pain, blood in stool and melena.  Genitourinary: Negative.  Negative for hematuria.  Musculoskeletal: Negative.  Negative for back pain.  Skin: Negative.  Negative for rash.  Neurological: Negative.  Negative for dizziness, focal weakness, weakness and headaches.  Psychiatric/Behavioral: Negative.  The patient is not nervous/anxious.     As per HPI. Otherwise, a complete review of systems is negative.  PAST MEDICAL HISTORY: Past Medical History:  Diagnosis Date   CHF (congestive  heart failure) (HCC)    Diabetes mellitus without complication (HCC)    GERD (gastroesophageal reflux disease)    Hypertension    Insomnia    Iron  deficiency anemia    RLS (restless legs syndrome)     PAST SURGICAL HISTORY: Past Surgical History:  Procedure Laterality Date   BLADDER REPAIR     CATARACT EXTRACTION W/PHACO Right 05/13/2016   Procedure: CATARACT EXTRACTION PHACO AND INTRAOCULAR LENS PLACEMENT (IOC);  Surgeon: Clair Crews, MD;  Location: [facility] ORS;  Service: Ophthalmology;  Laterality: Right;  US  1.09 AP% 21.0 CDE 14.60 Fluid Pack Lot # [id number]   CATARACT EXTRACTION W/PHACO Left 07/15/2016   Procedure: CATARACT EXTRACTION PHACO AND INTRAOCULAR LENS PLACEMENT (IOC);  Surgeon: Clair Crews, MD;  Location: [facility] ORS;  Service: Ophthalmology;  Laterality: Left;  Lot# [id number] H US : 01:17.9 AP%:27.8 CDE: 21.67    ESOPHAGOGASTRODUODENOSCOPY (EGD) WITH PROPOFOL  N/A 03/26/2023   Procedure: ESOPHAGOGASTRODUODENOSCOPY (EGD) WITH PROPOFOL ;  Surgeon: Marnee Sink, MD;  Location: [facility] ENDOSCOPY;  Service: Endoscopy;  Laterality: N/A;   HEMOSTASIS CLIP PLACEMENT  03/26/2023   Procedure: HEMOSTASIS CLIP PLACEMENT;  Surgeon: Marnee Sink, MD;  Location: [facility] ENDOSCOPY;  Service: Endoscopy;;   HEMOSTASIS CONTROL  03/26/2023   Procedure: HEMOSTASIS CONTROL;  Surgeon: Marnee Sink, MD;  Location: [facility] ENDOSCOPY;  Service: Endoscopy;;   HOT HEMOSTASIS  03/26/2023   Procedure: HOT HEMOSTASIS (ARGON PLASMA COAGULATION/BICAP);  Surgeon: Marnee Sink, MD;  Location: [facility] ENDOSCOPY;  Service: Endoscopy;;    FAMILY HISTORY: Reviewed and unchanged. No reported history of malignancy or chronic disease.     ADVANCED DIRECTIVES:    HEALTH MAINTENANCE: Social History   Tobacco Use   Smoking status: Former   Smokeless tobacco: Never  Advertising account planner  Vaping status: Never Used  Substance Use Topics   Alcohol use: No   Drug use: No     Colonoscopy:  PAP:  Bone density:  Lipid  panel:  Allergies  Allergen Reactions   Atorvastatin Other (See Comments)   Codeine    Demerol [Meperidine]    Naproxen Rash    Caused rash on mouth    Current Outpatient Medications  Medication Sig Dispense Refill   allopurinol  (ZYLOPRIM ) 100 MG tablet Take 0.5 tablets (50 mg total) by mouth daily. 15 tablet 0   aspirin  EC 81 MG tablet Take 1 tablet (81 mg total) by mouth daily. Swallow whole. 30 tablet 12   carvedilol  (COREG ) 3.125 MG tablet Take 1 tablet (3.125 mg total) by mouth 2 (two) times daily with a meal. 60 tablet 0   ferrous sulfate  325 (65 FE) MG tablet Take 325 mg by mouth daily.     gabapentin  (NEURONTIN ) 100 MG capsule Take 100 mg by mouth at bedtime.     isosorbide  mononitrate (IMDUR ) 60 MG 24 hr tablet Take by mouth daily.     Melatonin 10 MG TABS Take 20 mg by mouth at bedtime.     Multiple Vitamins-Minerals (MULTIVITAMIN WITH MINERALS) tablet Take 1 tablet by mouth daily.     omeprazole (PRILOSEC) 20 MG capsule Take 20 mg by mouth daily.     pramipexole  (MIRAPEX ) 0.25 MG tablet Take 0.25 mg by mouth in the morning and at bedtime.     rosuvastatin  (CRESTOR ) 5 MG tablet Take 5 mg by mouth at bedtime.     traZODone  (DESYREL ) 50 MG tablet Take 50 mg by mouth at bedtime.     Dextromethorphan -guaiFENesin  20-200 MG/20ML LIQD Take 10 mLs by mouth every 4 (four) hours as needed. (Patient not taking: Reported on 12/15/2023) 118 mL 0   furosemide  (LASIX ) 40 MG tablet Take 1 tablet (40 mg total) by mouth 2 (two) times daily. 60 tablet 0   neomycin -polymyxin b-dexamethasone  (MAXITROL ) 3.5-10000-0.1 SUSP Place 1 drop into the right eye 3 (three) times daily. (Patient not taking: Reported on 10/09/2023)     nitroGLYCERIN  (NITROSTAT ) 0.4 MG SL tablet Place 1 tablet (0.4 mg total) under the tongue every 5 (five) minutes as needed for chest pain. (Patient not taking: Reported on 10/09/2023) 20 tablet 12   No current facility-administered medications for this visit.    Facility-Administered Medications Ordered in Other Visits  Medication Dose Route Frequency Provider Last Rate Last Admin   0.9 %  sodium chloride  infusion   Intravenous PRN Sharla Davis, PA-C 10 mL/hr at 03/26/23 1110 Continued from Pre-op at 03/26/23 1110    OBJECTIVE: Vitals:   12/15/23 1320  BP: (!) 147/58  Pulse: 70  Resp: 16  Temp: (!) 97.4 F (36.3 C)  SpO2: 95%      Body mass index is 26.76 kg/m.    ECOG FS:0 - Asymptomatic  General: Well-developed, well-nourished, no acute distress. Eyes: Pink conjunctiva, anicteric sclera. HEENT: Normocephalic, moist mucous membranes. Lungs: No audible wheezing or coughing. Heart: Regular rate and rhythm. Abdomen: Soft, nontender, no obvious distention. Musculoskeletal: No edema, cyanosis, or clubbing. Neuro: Alert, answering all questions appropriately. Cranial nerves grossly intact. Skin: No rashes or petechiae noted. Psych: Normal affect.  LAB RESULTS:  Lab Results  Component Value Date   NA 136 10/12/2023   K 4.6 10/12/2023   CL 103 10/12/2023   CO2 23 10/12/2023   GLUCOSE 228 (H) 10/12/2023   BUN 48 (H) 10/12/2023  CREATININE 1.77 (H) 10/12/2023   CALCIUM  9.7 10/12/2023   PROT 5.8 (L) 10/10/2023   ALBUMIN  2.9 (L) 10/10/2023   AST 14 (L) 10/10/2023   ALT 11 10/10/2023   ALKPHOS 57 10/10/2023   BILITOT 0.5 10/10/2023   GFRNONAA 26 (L) 10/12/2023   GFRAA >60 02/17/2015    Lab Results  Component Value Date   WBC 5.3 12/15/2023   NEUTROABS 4.0 12/15/2023   HGB 8.6 (L) 12/15/2023   HCT 26.7 (L) 12/15/2023   MCV 101.5 (H) 12/15/2023   PLT 219 12/15/2023   Lab Results  Component Value Date   IRON  276 (H) 11/24/2023   TIBC 426 11/24/2023   IRONPCTSAT 65 (H) 11/24/2023   Lab Results  Component Value Date   FERRITIN 25 10/15/2023     STUDIES: No results found.   ASSESSMENT: Iron  deficiency anemia.  PLAN:    Iron  deficiency anemia: EGD on March 26, 2023 revealed an actively bleeding  duodenal lesion which was cauterized at the time achieving hemostasis.  Suspect patient is still actively bleeding has sent a referral back to GI.  Hemoglobin mildly improved 8.6.  She does not wish to have transfusion this week.  Return to clinic in 2 weeks for laboratory work and consideration of blood.  Patient will then return to clinic in 4 weeks for further evaluation and continuation of treatment.   GI bleed: Patient has chronic dark stools secondary to iron  supplementation.  Suspect patient still has active GI bleed and a referral previously was sent to GI. Hypertension: Patient's blood pressure is moderately elevated today.  Continue monitoring and treatment per primary care.    Patient expressed understanding and was in agreement with this plan. She also understands that She can call clinic at any time with any questions, concerns, or complaints.    Shellie Dials, MD   12/15/2023 1:29 PM

## 2023-12-17 ENCOUNTER — Inpatient Hospital Stay

## 2023-12-29 ENCOUNTER — Inpatient Hospital Stay

## 2023-12-30 ENCOUNTER — Telehealth: Payer: Self-pay | Admitting: Oncology

## 2023-12-30 NOTE — Telephone Encounter (Signed)
 Called patient to try to get lab appointment rescheduled to later due to missing it yesterday. No answer and not able to leave voicemail. Appointment cancelled per secure chat.

## 2023-12-31 ENCOUNTER — Inpatient Hospital Stay

## 2024-01-11 ENCOUNTER — Other Ambulatory Visit: Payer: Self-pay | Admitting: *Deleted

## 2024-01-11 DIAGNOSIS — D509 Iron deficiency anemia, unspecified: Secondary | ICD-10-CM

## 2024-01-12 ENCOUNTER — Inpatient Hospital Stay (HOSPITAL_BASED_OUTPATIENT_CLINIC_OR_DEPARTMENT_OTHER): Admitting: Oncology

## 2024-01-12 ENCOUNTER — Inpatient Hospital Stay: Admitting: Oncology

## 2024-01-12 ENCOUNTER — Inpatient Hospital Stay: Attending: Oncology

## 2024-01-12 ENCOUNTER — Encounter: Payer: Self-pay | Admitting: Oncology

## 2024-01-12 ENCOUNTER — Inpatient Hospital Stay

## 2024-01-12 VITALS — BP 147/53 | HR 72 | Temp 97.3°F | Resp 16 | Ht 61.0 in | Wt 137.8 lb

## 2024-01-12 DIAGNOSIS — Z87891 Personal history of nicotine dependence: Secondary | ICD-10-CM | POA: Insufficient documentation

## 2024-01-12 DIAGNOSIS — D509 Iron deficiency anemia, unspecified: Secondary | ICD-10-CM | POA: Diagnosis not present

## 2024-01-12 DIAGNOSIS — I110 Hypertensive heart disease with heart failure: Secondary | ICD-10-CM | POA: Insufficient documentation

## 2024-01-12 DIAGNOSIS — Z79899 Other long term (current) drug therapy: Secondary | ICD-10-CM | POA: Diagnosis not present

## 2024-01-12 DIAGNOSIS — K921 Melena: Secondary | ICD-10-CM | POA: Insufficient documentation

## 2024-01-12 LAB — CBC WITH DIFFERENTIAL/PLATELET
Abs Immature Granulocytes: 0.04 10*3/uL (ref 0.00–0.07)
Basophils Absolute: 0.1 10*3/uL (ref 0.0–0.1)
Basophils Relative: 1 %
Eosinophils Absolute: 0.3 10*3/uL (ref 0.0–0.5)
Eosinophils Relative: 3 %
HCT: 24.4 % — ABNORMAL LOW (ref 36.0–46.0)
Hemoglobin: 8 g/dL — ABNORMAL LOW (ref 12.0–15.0)
Immature Granulocytes: 1 %
Lymphocytes Relative: 9 %
Lymphs Abs: 0.8 10*3/uL (ref 0.7–4.0)
MCH: 32.1 pg (ref 26.0–34.0)
MCHC: 32.8 g/dL (ref 30.0–36.0)
MCV: 98 fL (ref 80.0–100.0)
Monocytes Absolute: 0.5 10*3/uL (ref 0.1–1.0)
Monocytes Relative: 5 %
Neutro Abs: 7 10*3/uL (ref 1.7–7.7)
Neutrophils Relative %: 81 %
Platelets: 225 10*3/uL (ref 150–400)
RBC: 2.49 MIL/uL — ABNORMAL LOW (ref 3.87–5.11)
RDW: 13.2 % (ref 11.5–15.5)
WBC: 8.6 10*3/uL (ref 4.0–10.5)
nRBC: 0 % (ref 0.0–0.2)

## 2024-01-12 LAB — IRON AND TIBC
Iron: 152 ug/dL (ref 28–170)
Saturation Ratios: 36 % — ABNORMAL HIGH (ref 10.4–31.8)
TIBC: 424 ug/dL (ref 250–450)
UIBC: 272 ug/dL

## 2024-01-12 LAB — SAMPLE TO BLOOD BANK

## 2024-01-12 LAB — FERRITIN: Ferritin: 9 ng/mL — ABNORMAL LOW (ref 11–307)

## 2024-01-12 NOTE — Progress Notes (Signed)
 [facility]  Telephone:(336) [phone] Fax:(336) [phone]  ID: Lisa Mcneil OB: [birth year]  MR#: [id number]  AOZ#:[id number]  Patient Care Team: Monique Ano, MD as PCP - General (Family Medicine) Shellie Dials, MD as Consulting Physician (Hematology and Oncology)  CHIEF COMPLAINT:  Iron  deficiency anemia.  INTERVAL HISTORY: Patient returns to clinic today for repeat laboratory work, further evaluation, and consideration of blood transfusion.  She continues to feel well and remains asymptomatic.  She denies any weakness or fatigue.  She remains active and works every Monday, Wednesday, and Friday at the [employer] in [place].  She has no neurologic complaints. She denies any recent fevers or illnesses. She has a good appetite and denies weight loss.  She denies any chest pain, shortness of breath, cough, or hemoptysis.  She denies any nausea, vomiting, consultation, or diarrhea.  She continues to have dark stools, but reports she also takes iron  supplementation.  She has no urinary complaints.  Patient offers no further specific complaints today.  REVIEW OF SYSTEMS:   Review of Systems  Constitutional: Negative.  Negative for fever, malaise/fatigue and weight loss.  Respiratory: Negative.  Negative for cough and shortness of breath.   Cardiovascular: Negative.  Negative for chest pain and leg swelling.  Gastrointestinal: Negative.  Negative for abdominal pain, blood in stool and melena.  Genitourinary: Negative.  Negative for hematuria.  Musculoskeletal: Negative.  Negative for back pain.  Skin: Negative.  Negative for rash.  Neurological: Negative.  Negative for dizziness, focal weakness, weakness and headaches.  Psychiatric/Behavioral: Negative.  The patient is not nervous/anxious.     As per HPI. Otherwise, a complete review of systems is negative.  PAST MEDICAL HISTORY: Past Medical History:  Diagnosis Date   CHF (congestive heart failure)  (HCC)    Diabetes mellitus without complication (HCC)    GERD (gastroesophageal reflux disease)    Hypertension    Insomnia    Iron  deficiency anemia    RLS (restless legs syndrome)     PAST SURGICAL HISTORY: Past Surgical History:  Procedure Laterality Date   BLADDER REPAIR     CATARACT EXTRACTION W/PHACO Right 05/13/2016   Procedure: CATARACT EXTRACTION PHACO AND INTRAOCULAR LENS PLACEMENT (IOC);  Surgeon: Clair Crews, MD;  Location: [facility] ORS;  Service: Ophthalmology;  Laterality: Right;  US  1.09 AP% 21.0 CDE 14.60 Fluid Pack Lot # [id number]   CATARACT EXTRACTION W/PHACO Left 07/15/2016   Procedure: CATARACT EXTRACTION PHACO AND INTRAOCULAR LENS PLACEMENT (IOC);  Surgeon: Clair Crews, MD;  Location: [facility] ORS;  Service: Ophthalmology;  Laterality: Left;  Lot# [id number] H US : 01:17.9 AP%:27.8 CDE: 21.67    ESOPHAGOGASTRODUODENOSCOPY (EGD) WITH PROPOFOL  N/A 03/26/2023   Procedure: ESOPHAGOGASTRODUODENOSCOPY (EGD) WITH PROPOFOL ;  Surgeon: Marnee Sink, MD;  Location: [facility] ENDOSCOPY;  Service: Endoscopy;  Laterality: N/A;   HEMOSTASIS CLIP PLACEMENT  03/26/2023   Procedure: HEMOSTASIS CLIP PLACEMENT;  Surgeon: Marnee Sink, MD;  Location: [facility] ENDOSCOPY;  Service: Endoscopy;;   HEMOSTASIS CONTROL  03/26/2023   Procedure: HEMOSTASIS CONTROL;  Surgeon: Marnee Sink, MD;  Location: [facility] ENDOSCOPY;  Service: Endoscopy;;   HOT HEMOSTASIS  03/26/2023   Procedure: HOT HEMOSTASIS (ARGON PLASMA COAGULATION/BICAP);  Surgeon: Marnee Sink, MD;  Location: [facility] ENDOSCOPY;  Service: Endoscopy;;    FAMILY HISTORY: Reviewed and unchanged. No reported history of malignancy or chronic disease.     ADVANCED DIRECTIVES:    HEALTH MAINTENANCE: Social History   Tobacco Use   Smoking status: Former   Smokeless tobacco: Never  Advertising account planner  Vaping status: Never Used  Substance Use Topics   Alcohol use: No   Drug use: No     Colonoscopy:  PAP:  Bone density:  Lipid panel:  Allergies   Allergen Reactions   Atorvastatin Other (See Comments)   Codeine    Demerol [Meperidine]    Naproxen Rash    Caused rash on mouth    Current Outpatient Medications  Medication Sig Dispense Refill   allopurinol  (ZYLOPRIM ) 100 MG tablet Take 0.5 tablets (50 mg total) by mouth daily. 15 tablet 0   aspirin  EC 81 MG tablet Take 1 tablet (81 mg total) by mouth daily. Swallow whole. 30 tablet 12   carvedilol  (COREG ) 3.125 MG tablet Take 1 tablet (3.125 mg total) by mouth 2 (two) times daily with a meal. 60 tablet 0   ferrous sulfate  325 (65 FE) MG tablet Take 325 mg by mouth daily.     gabapentin  (NEURONTIN ) 100 MG capsule Take 100 mg by mouth at bedtime.     isosorbide  mononitrate (IMDUR ) 60 MG 24 hr tablet Take by mouth daily.     Melatonin 10 MG TABS Take 20 mg by mouth at bedtime.     Multiple Vitamins-Minerals (MULTIVITAMIN WITH MINERALS) tablet Take 1 tablet by mouth daily.     omeprazole (PRILOSEC) 20 MG capsule Take 20 mg by mouth daily.     pramipexole  (MIRAPEX ) 0.25 MG tablet Take 0.25 mg by mouth in the morning and at bedtime.     rosuvastatin  (CRESTOR ) 5 MG tablet Take 5 mg by mouth at bedtime.     traZODone  (DESYREL ) 50 MG tablet Take 50 mg by mouth at bedtime.     Dextromethorphan -guaiFENesin  20-200 MG/20ML LIQD Take 10 mLs by mouth every 4 (four) hours as needed. (Patient not taking: Reported on 11/24/2023) 118 mL 0   furosemide  (LASIX ) 40 MG tablet Take 1 tablet (40 mg total) by mouth 2 (two) times daily. 60 tablet 0   neomycin -polymyxin b-dexamethasone  (MAXITROL ) 3.5-10000-0.1 SUSP Place 1 drop into the right eye 3 (three) times daily. (Patient not taking: Reported on 10/09/2023)     nitroGLYCERIN  (NITROSTAT ) 0.4 MG SL tablet Place 1 tablet (0.4 mg total) under the tongue every 5 (five) minutes as needed for chest pain. (Patient not taking: Reported on 10/09/2023) 20 tablet 12   No current facility-administered medications for this visit.   Facility-Administered Medications  Ordered in Other Visits  Medication Dose Route Frequency Provider Last Rate Last Admin   0.9 %  sodium chloride  infusion   Intravenous PRN Sharla Davis, PA-C 10 mL/hr at 03/26/23 1110 Continued from Pre-op at 03/26/23 1110    OBJECTIVE: Vitals:   01/12/24 1425  BP: (!) 147/53  Pulse: 72  Resp: 16  Temp: (!) 97.3 F (36.3 C)  SpO2: 98%      Body mass index is 26.04 kg/m.    ECOG FS:0 - Asymptomatic  General: Well-developed, well-nourished, no acute distress. Eyes: Pink conjunctiva, anicteric sclera. HEENT: Normocephalic, moist mucous membranes. Lungs: No audible wheezing or coughing. Heart: Regular rate and rhythm. Abdomen: Soft, nontender, no obvious distention. Musculoskeletal: No edema, cyanosis, or clubbing. Neuro: Alert, answering all questions appropriately. Cranial nerves grossly intact. Skin: No rashes or petechiae noted. Psych: Normal affect.  LAB RESULTS:  Lab Results  Component Value Date   NA 136 10/12/2023   K 4.6 10/12/2023   CL 103 10/12/2023   CO2 23 10/12/2023   GLUCOSE 228 (H) 10/12/2023   BUN 48 (H) 10/12/2023  CREATININE 1.77 (H) 10/12/2023   CALCIUM  9.7 10/12/2023   PROT 5.8 (L) 10/10/2023   ALBUMIN  2.9 (L) 10/10/2023   AST 14 (L) 10/10/2023   ALT 11 10/10/2023   ALKPHOS 57 10/10/2023   BILITOT 0.5 10/10/2023   GFRNONAA 26 (L) 10/12/2023   GFRAA >60 02/17/2015    Lab Results  Component Value Date   WBC 8.6 01/12/2024   NEUTROABS 7.0 01/12/2024   HGB 8.0 (L) 01/12/2024   HCT 24.4 (L) 01/12/2024   MCV 98.0 01/12/2024   PLT 225 01/12/2024   Lab Results  Component Value Date   IRON  138 12/15/2023   TIBC 388 12/15/2023   IRONPCTSAT 36 (H) 12/15/2023   Lab Results  Component Value Date   FERRITIN 25 10/15/2023     STUDIES: No results found.   ASSESSMENT: Iron  deficiency anemia.  PLAN:    Iron  deficiency anemia: EGD on March 26, 2023 revealed an actively bleeding duodenal lesion which was cauterized at the time  achieving hemostasis.  Suspect patient is still actively bleeding have sent a referral back to GI.  Hemoglobin has decreased to 8.0.  Return to clinic Thursday for 1 unit of packed red blood cell.  Patient will then return to clinic in 4 weeks for laboratory work and evaluation on Tuesday and then possible blood on Thursday.    GI bleed: Patient has chronic dark stools possibly secondary to iron  supplementation, but suspect patient still has active GI bleed and a referral previously was sent to GI. Hypertension: Chronic and unchanged.  Patient's blood pressure is moderately elevated today.  Continue monitoring and treatment per primary care.  I spent a total of 30 minutes reviewing chart data, face-to-face evaluation with the patient, counseling and coordination of care as detailed above.   Patient expressed understanding and was in agreement with this plan. She also understands that She can call clinic at any time with any questions, concerns, or complaints.    Shellie Dials, MD   01/12/2024 3:06 PM

## 2024-01-13 ENCOUNTER — Other Ambulatory Visit: Payer: Self-pay | Admitting: *Deleted

## 2024-01-13 DIAGNOSIS — D509 Iron deficiency anemia, unspecified: Secondary | ICD-10-CM

## 2024-01-13 LAB — PREPARE RBC (CROSSMATCH)

## 2024-01-14 ENCOUNTER — Inpatient Hospital Stay

## 2024-01-14 DIAGNOSIS — D509 Iron deficiency anemia, unspecified: Secondary | ICD-10-CM | POA: Diagnosis not present

## 2024-01-14 DIAGNOSIS — Z87891 Personal history of nicotine dependence: Secondary | ICD-10-CM | POA: Diagnosis not present

## 2024-01-14 DIAGNOSIS — Z79899 Other long term (current) drug therapy: Secondary | ICD-10-CM | POA: Diagnosis not present

## 2024-01-14 DIAGNOSIS — K921 Melena: Secondary | ICD-10-CM | POA: Diagnosis not present

## 2024-01-14 DIAGNOSIS — I110 Hypertensive heart disease with heart failure: Secondary | ICD-10-CM | POA: Diagnosis not present

## 2024-01-14 MED ORDER — SODIUM CHLORIDE 0.9% IV SOLUTION
250.0000 mL | INTRAVENOUS | Status: DC
  Administered 2024-01-14: 250 mL via INTRAVENOUS
  Filled 2024-01-14: qty 250

## 2024-01-14 MED ORDER — DIPHENHYDRAMINE HCL 50 MG/ML IJ SOLN
25.0000 mg | Freq: Once | INTRAMUSCULAR | Status: AC
  Administered 2024-01-14: 25 mg via INTRAVENOUS
  Filled 2024-01-14: qty 1

## 2024-01-14 MED ORDER — ACETAMINOPHEN 325 MG PO TABS
650.0000 mg | ORAL_TABLET | Freq: Once | ORAL | Status: AC
  Administered 2024-01-14: 650 mg via ORAL
  Filled 2024-01-14: qty 2

## 2024-01-14 NOTE — Progress Notes (Signed)
 Patient here today for blood transfusion. Premeds given as ordered. Tylenol  650 mg PO and Benadryl  25 mg IV. Patient began having severe restless legs about 15 minutes after the Benadryl  was given. Restless legs continued throughout transfusion and although there was some improvement, it was still present at discharge. Patient stated that Lisa Mcneil did not want to get Benadryl  in the future. Benadryl  was added to her allergy list as an intolerance.

## 2024-01-15 LAB — BPAM RBC
Blood Product Expiration Date: 202506272359
ISSUE DATE / TIME: 202506051111
Unit Type and Rh: 202506272359
Unit Type and Rh: 6200

## 2024-01-15 LAB — TYPE AND SCREEN
ABO/RH(D): A POS
Antibody Screen: NEGATIVE
Unit division: 0

## 2024-02-02 ENCOUNTER — Encounter: Payer: Self-pay | Admitting: Oncology

## 2024-02-09 ENCOUNTER — Encounter: Payer: Self-pay | Admitting: Oncology

## 2024-02-09 ENCOUNTER — Inpatient Hospital Stay: Admitting: Oncology

## 2024-02-09 ENCOUNTER — Inpatient Hospital Stay: Attending: Oncology

## 2024-02-11 ENCOUNTER — Inpatient Hospital Stay

## 2024-03-28 ENCOUNTER — Other Ambulatory Visit: Payer: Self-pay | Admitting: Family Medicine

## 2024-03-28 ENCOUNTER — Ambulatory Visit
Admission: RE | Admit: 2024-03-28 | Discharge: 2024-03-28 | Disposition: A | Discharge status: Home / Self Care | Source: Ambulatory Visit | Attending: Family Medicine | Admitting: Family Medicine

## 2024-03-28 DIAGNOSIS — R011 Cardiac murmur, unspecified: Secondary | ICD-10-CM | POA: Diagnosis not present

## 2024-03-28 DIAGNOSIS — M25562 Pain in left knee: Secondary | ICD-10-CM | POA: Diagnosis not present

## 2024-03-28 DIAGNOSIS — E782 Mixed hyperlipidemia: Secondary | ICD-10-CM | POA: Diagnosis not present

## 2024-03-28 DIAGNOSIS — R600 Localized edema: Secondary | ICD-10-CM | POA: Insufficient documentation

## 2024-03-28 DIAGNOSIS — E119 Type 2 diabetes mellitus without complications: Secondary | ICD-10-CM | POA: Diagnosis not present

## 2024-03-28 DIAGNOSIS — I10 Essential (primary) hypertension: Secondary | ICD-10-CM | POA: Diagnosis not present

## 2024-03-28 NOTE — Progress Notes (Signed)
 Chief Complaint  Patient presents with  . Follow-up    HPI  Lisa Mcneil is a 89 y.o. here for follow up of chronic medical issues  Acute left lower extremity swelling: Patient woke up with acute swelling of left lower extremity.  Has pain of the left knee.  Denies any redness or bruising.  Denies any trauma or injury.  No previous history of DVT.  No recent history of surgery or immobilization.  HTN: No acute issues.  Tolerating meds without adverse effects.  Not checking home blood pressures.  Denies HAs, vision changes, dizziness, cp, or syncopal episodes.  AODM: No acute issues.  Tolerating meds without adverse effects.  Not checking home CBGs.  No hypoglycemic episodes.  No new neuropathy.    HLD: No acute issues.  Tolerating medications without adverse effects.  Denies any myalgia.  No cp or CVA symptoms.   Cute left knee pain: New issue.  No swelling or redness or bruising per patient.  No trauma or injury.  Worse with walking.  ROS Review of systems is unremarkable for any active cardiac, respiratory, GI, GU, hematologic, neurologic, dermatologic, HEENT, or psychiatric symptoms except as noted above.  No fevers, chills, or constitutional symptoms.   Current Outpatient Medications  Medication Sig Dispense Refill  . alcohol swabs (DROPSAFE ALCOHOL PREP PADS) PadM USE AS DIRECTED PRIOR TO CHECKING BLOOD SUGAR 100 each 3  . allopurinoL  (ZYLOPRIM ) 100 MG tablet TAKE 1/2 TABLET ONE TIME DAILY 45 tablet 3  . aspirin  81 MG EC tablet TAKE 1 TABLET ONE TIME DAILY 90 tablet 3  . blood glucose meter kit Use as directed (TRUE METRIX DX E11.69) 1 each 1  . carvediloL  (COREG ) 3.125 MG tablet TAKE 1 TABLET TWICE DAILY WITH MEALS 180 tablet 3  . dextromethorphan -guaifenesin  (ROBITUSSIN COUGH-CHEST CONG DM) 5-50 mg/5 mL Liqd Take 10 mLs by mouth as needed    . ferrous sulfate  325 (65 FE) MG tablet Take 325 mg by mouth daily with breakfast    . FUROsemide  (LASIX ) 40 MG tablet TAKE 1 TABLET  TWICE DAILY (Patient taking differently: Take 20 mg by mouth 2 (two) times daily) 180 tablet 3  . gabapentin  (NEURONTIN ) 100 MG capsule TAKE 1 CAPSULE AT BEDTIME 90 capsule 3  . lancets (TRUEPLUS LANCETS) CHECK FASTING BLOOD SUGAR ONCE DAILY 100 each 1  . melatonin 10 mg Tab Take 2 tablets by mouth at bedtime as needed Gummies    . multivitamin with minerals tablet Take 1 tablet by mouth once daily    . nitroGLYcerin  (NITROSTAT ) 0.4 MG SL tablet Place 0.4 mg under the tongue every 5 (five) minutes as needed    . omeprazole (PRILOSEC) 20 MG DR capsule Take 1 capsule (20 mg total) by mouth once daily 100 capsule 1  . pramipexole  (MIRAPEX ) 0.25 MG tablet TAKE 2 TABLETS EVERY DAY 200 tablet 1  . rosuvastatin  (CRESTOR ) 5 MG tablet take 1 tablet at bedtime 90 tablet 3  . traZODone  (DESYREL ) 50 MG tablet TAKE 1 TABLET EVERY NIGHT 10 tablet 1  . TRUE METRIX GLUCOSE TEST STRIP test strip TEST BLOOD SUGAR ONE TIME DAILY AS INSTRUCTED 100 strip 3  . TRUE METRIX LEVEL 1 Soln USE AS DIRECTED WITH GLUCOSE METER 1 each 3   No current facility-administered medications for this visit.    Allergies as of 03/28/2024 - Reviewed 03/28/2024  Allergen Reaction Noted  . Diphenhydramine  Other (See Comments) 01/14/2024  . Atorvastatin Muscle Pain 06/15/2020  . Codeine Other (See Comments) 03/27/2014  .  Demerol [meperidine] Other (See Comments) 03/27/2014  . Ferrex 150 [polysaccharide iron  complex] Other (See Comments) 03/27/2014  . Naproxen Rash 10/17/2014    Patient Active Problem List  Diagnosis  . Type 2 diabetes mellitus with hyperlipidemia (A1c 6.4% - 03/23/23)  . Mixed hyperlipidemia (LDL 84 - 05/29/23)  . RLS (restless legs syndrome)  . Iron  deficiency (Hgb 9.6 - 05/29/23) - s/p iron  infusions - followed by Dr. Jacobo  . Essential hypertension  . GERD without esophagitis  . Medicare annual wellness visit, initial: 04/22/13  . Medicare annual wellness visit, subsequent 05/09/22  . DNR (do not  resuscitate)  . Primary insomnia  . CKD (chronic kidney disease) stage 4, Cr 2.1, GFR 22 - 05/29/23 - followed by nephrology  . Localized osteoporosis without current pathological fracture (dexa 04/26/19 - repeat 2 yrs)  . Systolic murmur, unspecified (4/6 - 03/28/24; unchanged)  . Chronic diastolic CHF (ECHO 11/2020)  . Secondary hyperparathyroidism of renal origin (CMS/HHS-HCC)  . Chronic diastolic CHF (congestive heart failure) (CMS-HCC) - ECHO 06/15/22  . CAD (coronary artery disease)  . NSTEMI (non-ST elevated myocardial infarction) (CMS/HHS-HCC)  . Aortic valve stenosis    Past Medical History:  Diagnosis Date  . Diabetes mellitus (CMS/HHS-HCC)    adult onset (A1c 7.9% - 11/2013)  . Hyperlipidemia    (LDL 98 - 08/2013)  . Hypertension   . Insomnia   . Iron  deficiency anemia    (Hgb 12.8, Iron  39, Ferritin 16 - 11/2013) - intolerant to iron  supplementation  . RLS (restless legs syndrome)     Past Surgical History:  Procedure Laterality Date  . Bladder suspension surgery      Vitals:   03/28/24 1209 03/28/24 1218  BP: (!) 164/60 (!) 152/56  Pulse: 67   SpO2: 95%   Weight: 60.3 kg (133 lb)   Height: 156.2 cm (5' 1.5)   PainSc:   3   PainLoc: Knee    Body mass index is 24.72 kg/m.  Exam  General. Well appearing; NAD; VS reviewed     Eyes. Sclera and conjunctiva clear; Vision grossly intact; extraocular movements intact Neck. Supple.  Lungs. Respirations unlabored; clear to auscultation bilaterally Cardiovascular. Heart regular rate and rhythm with 4/6 systolic ejection murmur (unchanged) Abdomen. Soft; non tender; non distended; no masses or organomegaly Extremities.  Significant size difference in the left leg compared to the right leg in circumference; no erythema or ecchymosis.  Not warm to the touch.  Minimal tenderness to palpation Skin. Normal color and turgor MSK. L knee-no obvious deformity or effusion.  Mild tenderness to palpation along the joint line.   Full range of motion without significant crepitus Neurologic. Alert and oriented x3; CN 2-12 grossly intact; no focal deficits  Assessment and Plan  1. Type 2 diabetes mellitus with hyperlipidemia (A1c 6.4% - 03/23/23) Unchanged.  A1c tomorrow.  Adjust treatment accordingly. -     blood glucose meter kit; Use as directed (TRUE METRIX DX E11.69) -     Hemoglobin A1C; Future  2. Edema of left lower extremity Acute.  Plan to evaluate further.  Given the disproportionate circumference compared to the right leg, plan to obtain lower extremity ultrasound today to rule out DVT.  Further treatment pending results. -     CARD US  lower extremity venous left; Future  3. Essential hypertension Mildly elevated systolic blood pressure.  Monitor for now.  Assess renal function and electrolytes tomorrow.  No changes to regimen until she checks her blood pressure more often  outside the office. -     Comprehensive Metabolic Panel (CMP); Future  4. Mixed hyperlipidemia (LDL 84 - 05/29/23) Unchanged.  FLP and hepatic function tomorrow.  Adjust treatment accordingly. -     Lipid Panel w/calc LDL; Future  5. Systolic murmur, unspecified (4/6 - 03/28/24; unchanged) Unchanged.  Asymptomatic except for unilateral swelling.  No crackles on exam.  No further workup at this time.  6. Acute pain of left knee New.  Suspect underlying osteoarthritis.  Plan to obtain x-ray for further evaluation.  Recommend topical Voltaren gel at this time. -     X-ray knee left 3 views; Future  F/u 3 months for reck; labs tomorrow; LLE u/s pending; xray L knee pending  ALDA CARPEN, MD

## 2024-03-31 DIAGNOSIS — E785 Hyperlipidemia, unspecified: Secondary | ICD-10-CM | POA: Diagnosis not present

## 2024-03-31 DIAGNOSIS — E1169 Type 2 diabetes mellitus with other specified complication: Secondary | ICD-10-CM | POA: Diagnosis not present

## 2024-03-31 DIAGNOSIS — E782 Mixed hyperlipidemia: Secondary | ICD-10-CM | POA: Diagnosis not present

## 2024-03-31 DIAGNOSIS — I10 Essential (primary) hypertension: Secondary | ICD-10-CM | POA: Diagnosis not present

## 2024-04-06 DIAGNOSIS — E1122 Type 2 diabetes mellitus with diabetic chronic kidney disease: Secondary | ICD-10-CM | POA: Diagnosis not present

## 2024-04-06 DIAGNOSIS — E611 Iron deficiency: Secondary | ICD-10-CM | POA: Diagnosis not present

## 2024-04-06 DIAGNOSIS — E785 Hyperlipidemia, unspecified: Secondary | ICD-10-CM | POA: Diagnosis not present

## 2024-04-06 DIAGNOSIS — N1832 Chronic kidney disease, stage 3b: Secondary | ICD-10-CM | POA: Diagnosis not present

## 2024-07-18 NOTE — Progress Notes (Signed)
 Established Patient Visit   Chief Complaint: Chief Complaint  Patient presents with   Follow-up    6 month follow up   Date of Service: 07/18/2024 Date of Birth: 1927-11-17 PCP: Alla Amis, MD  History of Present Illness: [name] is a 89 y.o.female patient who history of severe aortic stenosis relatively asymptomatic diabetes hypertension hyperlipidemia coronary artery disease with diastolic congestive heart failure appears to be reasonably compensated still very active no lower extremity edema no chest pain here for routine follow-up.  Patient still works a part-time job lives alone and denies any significant symptoms.  Patient has refused any further therapy for aortic stenosis including TAVR  Past Medical and Surgical History  Past Medical History Past Medical History:  Diagnosis Date   Diabetes mellitus (CMS/HHS-HCC)    adult onset (A1c 7.9% - 11/2013)   Hyperlipidemia    (LDL 98 - 08/2013)   Hypertension    Insomnia    Iron  deficiency anemia    (Hgb 12.8, Iron  39, Ferritin 16 - 11/2013) - intolerant to iron  supplementation   RLS (restless legs syndrome)     Past Surgical History She has a past surgical history that includes Bladder suspension surgery.   Medications and Allergies  Current Medications  Current Outpatient Medications  Medication Sig Dispense Refill   allopurinoL  (ZYLOPRIM ) 100 MG tablet Take 0.5 tablets (50 mg total) by mouth once daily 45 tablet 1   aspirin  81 MG EC tablet Take 1 tablet (81 mg total) by mouth once daily 90 tablet 1   blood glucose meter kit Use as directed (TRUE METRIX DX E11.69) 1 each 1   carvediloL  (COREG ) 3.125 MG tablet Take 1 tablet (3.125 mg total) by mouth 2 (two) times daily with meals 180 tablet 1   empagliflozin (JARDIANCE) 10 mg tablet Take 1 tablet (10 mg total) by mouth once daily 90 tablet 1   ferrous sulfate  325 (65 FE) MG tablet Take 325 mg by mouth daily with breakfast     FUROsemide  (LASIX ) 40 MG  tablet Take 1 tablet (40 mg total) by mouth once daily as needed for Edema 90 tablet 1   gabapentin  (NEURONTIN ) 100 MG capsule Take 1 capsule (100 mg total) by mouth at bedtime 90 capsule 1   melatonin 10 mg Tab Take 2 tablets by mouth at bedtime as needed Gummies     multivitamin with minerals tablet Take 1 tablet by mouth once daily     nitroGLYcerin  (NITROSTAT ) 0.4 MG SL tablet Place 1 tablet (0.4 mg total) under the tongue every 5 (five) minutes as needed 25 tablet 0   omeprazole (PRILOSEC) 20 MG DR capsule Take 1 capsule (20 mg total) by mouth once daily 90 capsule 1   pramipexole  (MIRAPEX ) 0.25 MG tablet Take 2 tablets (0.5 mg total) by mouth once daily 180 tablet 1   rosuvastatin  (CRESTOR ) 5 MG tablet Take 1 tablet (5 mg total) by mouth at bedtime 90 tablet 1   traZODone  (DESYREL ) 50 MG tablet Take 1 tablet (50 mg total) by mouth at bedtime 90 tablet 1   TRUE METRIX GLUCOSE TEST STRIP test strip TEST BLOOD SUGAR ONE TIME DAILY AS INSTRUCTED 100 strip 3   TRUE METRIX LEVEL 1 Soln USE AS DIRECTED WITH GLUCOSE METER 1 each 3   No current facility-administered medications for this visit.    Allergies: Diphenhydramine , Atorvastatin, Codeine, Demerol [meperidine], Ferrex 150 [polysaccharide iron  complex], and Naproxen  Social and Family History  Social History  reports that she  quit smoking about 40 years ago. Her smoking use included cigarettes. She started smoking about 79 years ago. She has a 39 pack-year smoking history. She has been exposed to tobacco smoke. She has never used smokeless tobacco. She reports that she does not drink alcohol and does not use drugs.  Family History Family History  Problem Relation Name Age of Onset   Heart disease Father      Review of Systems   Review of Systems: The patient denies chest pain, shortness of breath, orthopnea, paroxysmal nocturnal dyspnea, pedal edema, palpitations, heart racing, presyncope, syncope. Review of 12 Systems is  negative except as described above.  Physical Examination   Vitals:BP 134/74   Pulse 95   Ht 154.9 cm (5' 1)   Wt 54 kg (119 lb)   SpO2 97%   BMI 22.48 kg/m  Ht:154.9 cm (5' 1) Wt:54 kg (119 lb) ADJ:Anib surface area is 1.52 meters squared. Body mass index is 22.48 kg/m.  HEENT: Pupils equally reactive to light and accomodation  Neck: Supple without thyromegaly, carotid pulses 2+ Lungs: clear to auscultation bilaterally; no wheezes, rales, rhonchi Heart: Regular rate and rhythm.  No gallops, 3/6 sem murmurs or rub Abdomen: soft nontender, nondistended, with normal bowel sounds Extremities: no cyanosis, clubbing, or edema Peripheral Pulses: 2+ in all extremities, 2+ femoral pulses bilaterally Neurologic: Alert and oriented X3; speech intact; face symmetrical; moves all extremities well  Assessment   89 y.o. female with  1. Chronic diastolic CHF (congestive heart failure), NYHA class 1 (CMS/HHS-HCC)   2. NSTEMI (non-ST elevated myocardial infarction) (CMS/HHS-HCC)   3. Aortic stenosis, severe   4. Mixed hyperlipidemia   5. Primary hypertension   6. Stage 3 chronic kidney disease, unspecified whether stage 3a or 3b CKD (CMS-HCC)   7. Non-insulin  dependent type 2 diabetes mellitus (CMS/HHS-HCC)   8. Gastroesophageal reflux disease, unspecified whether esophagitis present   9. Stage 3b chronic kidney disease (CMS-HCC)   10. Chronic diastolic CHF (congestive heart failure) (CMS/HHS-HCC)   11. Essential hypertension   12. Systolic murmur, unspecified (4/6 - 05/27/23; unchanged)   13. CKD (chronic kidney disease) stage 4, Cr 2.1, GFR 22 - 05/29/23 - followed by nephrology        Plan  Severe aortic stenosis relatively asymptomatic no syncope no chest pain she has had diastolic heart failure in the past but refuses any invasive procedures including TAVR recommend continue medical therapy Diastolic congestive heart failure continue current medical therapy Coreg  Jardiance  Lasix  Hyperlipidemia continue Crestor  therapy for lipid management LDL goal should be less than 70 Chronic renal insufficiency stage IV continue adequate hydration renal perfusion avoid nephrotoxic drugs have the patient follow-up with nephrology Remote history of smoking continue to advise patient refrain from tobacco abuse History of non-STEMI in the past mainly presented as heart failure presentation with minimal chest pain patient was treated medically and currently is symptom-free Hypertension reasonably controlled continue Coreg  blood pressure has been well-maintained goal is less than 130/80 Have the patient follow-up in 6 months     Return in about 6 months (around 01/16/2025).  DWAYNE D CALLWOOD, MD  This dictation was prepared with dragon dictation.  Any transcription errors that result from this process are unintentional.

## 2024-08-25 ENCOUNTER — Encounter: Payer: Self-pay | Admitting: Oncology

## 2024-08-25 ENCOUNTER — Other Ambulatory Visit: Payer: Self-pay | Admitting: *Deleted

## 2024-08-25 ENCOUNTER — Other Ambulatory Visit: Payer: Self-pay | Admitting: Oncology

## 2024-08-25 ENCOUNTER — Telehealth: Payer: Self-pay | Admitting: *Deleted

## 2024-08-25 ENCOUNTER — Inpatient Hospital Stay: Attending: Oncology

## 2024-08-25 DIAGNOSIS — Z87891 Personal history of nicotine dependence: Secondary | ICD-10-CM | POA: Insufficient documentation

## 2024-08-25 DIAGNOSIS — Z79899 Other long term (current) drug therapy: Secondary | ICD-10-CM | POA: Insufficient documentation

## 2024-08-25 DIAGNOSIS — K921 Melena: Secondary | ICD-10-CM | POA: Diagnosis not present

## 2024-08-25 DIAGNOSIS — I110 Hypertensive heart disease with heart failure: Secondary | ICD-10-CM | POA: Diagnosis not present

## 2024-08-25 DIAGNOSIS — D509 Iron deficiency anemia, unspecified: Secondary | ICD-10-CM | POA: Diagnosis present

## 2024-08-25 LAB — CBC WITH DIFFERENTIAL/PLATELET
Abs Immature Granulocytes: 0.04 K/uL (ref 0.00–0.07)
Basophils Absolute: 0 K/uL (ref 0.0–0.1)
Basophils Relative: 1 %
Eosinophils Absolute: 0.1 K/uL (ref 0.0–0.5)
Eosinophils Relative: 1 %
HCT: 21.8 % — ABNORMAL LOW (ref 36.0–46.0)
Hemoglobin: 7.1 g/dL — ABNORMAL LOW (ref 12.0–15.0)
Immature Granulocytes: 1 %
Lymphocytes Relative: 9 %
Lymphs Abs: 0.8 K/uL (ref 0.7–4.0)
MCH: 30 pg (ref 26.0–34.0)
MCHC: 32.6 g/dL (ref 30.0–36.0)
MCV: 92 fL (ref 80.0–100.0)
Monocytes Absolute: 0.5 K/uL (ref 0.1–1.0)
Monocytes Relative: 6 %
Neutro Abs: 7.3 K/uL (ref 1.7–7.7)
Neutrophils Relative %: 82 %
Platelets: 264 K/uL (ref 150–400)
RBC: 2.37 MIL/uL — ABNORMAL LOW (ref 3.87–5.11)
RDW: 14.6 % (ref 11.5–15.5)
WBC: 8.8 K/uL (ref 4.0–10.5)
nRBC: 0 % (ref 0.0–0.2)

## 2024-08-25 LAB — IRON AND TIBC
Iron: 20 ug/dL — ABNORMAL LOW (ref 28–170)
Saturation Ratios: 6 % — ABNORMAL LOW (ref 10.4–31.8)
TIBC: 329 ug/dL (ref 250–450)
UIBC: 309 ug/dL

## 2024-08-25 LAB — PREPARE RBC (CROSSMATCH)

## 2024-08-25 LAB — FERRITIN: Ferritin: 43 ng/mL (ref 11–307)

## 2024-08-25 NOTE — Telephone Encounter (Signed)
 1231-Incoming call from Amy at Dr. Leonardo office. Pt's hgb 6.7 today. Amy is calling to see if Dr. Jacobo could accommodate pt tomorrow for a unit of blood.   1305-I spoke with Dr. Jacobo and Marjorie- ok to schedule pt for lab today for the type and screen. Dr. Jacobo will see the patient tom when she comes for the blood transfusion.  1310-RN called Amy back at [facility]. Amy was not available. RN Left msg with front reception that we were reaching out to patient to set up apts.  4 attempts on pt's mobile number made to reach patient to schedule her for a lab only. Pt's cell number is listed as the same for her son Charlie. The call allows me to announce the caller's name but phone line shortly hangs up the call. Pt's home line also called. Left vm on home line with detailed msg and triage phone number. I called pt's daughter in law, Asberry. She will give the patient the message and call me back.

## 2024-08-25 NOTE — Telephone Encounter (Signed)
 1343-RN spoke with pt's son.- He will have his mom reach out to cancer center   1406-patient returned my phone call. She will be driving herself to cancer center. Directions given to cancer center

## 2024-08-25 NOTE — Telephone Encounter (Signed)
 1336-Incoming call from Amy. Amy just spoke w/pt. Pt is aware that she needs to call our clinic for apts.

## 2024-08-26 ENCOUNTER — Telehealth: Payer: Self-pay | Admitting: Oncology

## 2024-08-26 ENCOUNTER — Inpatient Hospital Stay: Admitting: Oncology

## 2024-08-26 ENCOUNTER — Inpatient Hospital Stay

## 2024-08-26 ENCOUNTER — Encounter: Payer: Self-pay | Admitting: Oncology

## 2024-08-26 NOTE — Telephone Encounter (Signed)
 Per Dr. Georgina states if the pt calls back to reschedule her with an APP.   I called the pt no answer so I left a vm and sent a no show letter as well.

## 2024-08-27 ENCOUNTER — Other Ambulatory Visit: Payer: Self-pay

## 2024-08-27 ENCOUNTER — Observation Stay: Admission: EM | Admit: 2024-08-27 | Discharge: 2024-08-29 | Disposition: A

## 2024-08-27 ENCOUNTER — Encounter: Payer: Self-pay | Admitting: Intensive Care

## 2024-08-27 DIAGNOSIS — I5032 Chronic diastolic (congestive) heart failure: Secondary | ICD-10-CM | POA: Diagnosis not present

## 2024-08-27 DIAGNOSIS — G4700 Insomnia, unspecified: Secondary | ICD-10-CM | POA: Diagnosis not present

## 2024-08-27 DIAGNOSIS — M109 Gout, unspecified: Secondary | ICD-10-CM | POA: Insufficient documentation

## 2024-08-27 DIAGNOSIS — R2681 Unsteadiness on feet: Secondary | ICD-10-CM | POA: Diagnosis not present

## 2024-08-27 DIAGNOSIS — M6281 Muscle weakness (generalized): Secondary | ICD-10-CM | POA: Insufficient documentation

## 2024-08-27 DIAGNOSIS — D62 Acute posthemorrhagic anemia: Secondary | ICD-10-CM | POA: Diagnosis not present

## 2024-08-27 DIAGNOSIS — K921 Melena: Secondary | ICD-10-CM

## 2024-08-27 DIAGNOSIS — K922 Gastrointestinal hemorrhage, unspecified: Secondary | ICD-10-CM | POA: Insufficient documentation

## 2024-08-27 DIAGNOSIS — I130 Hypertensive heart and chronic kidney disease with heart failure and stage 1 through stage 4 chronic kidney disease, or unspecified chronic kidney disease: Secondary | ICD-10-CM | POA: Insufficient documentation

## 2024-08-27 DIAGNOSIS — Z7982 Long term (current) use of aspirin: Secondary | ICD-10-CM | POA: Insufficient documentation

## 2024-08-27 DIAGNOSIS — N184 Chronic kidney disease, stage 4 (severe): Secondary | ICD-10-CM | POA: Diagnosis not present

## 2024-08-27 DIAGNOSIS — I2510 Atherosclerotic heart disease of native coronary artery without angina pectoris: Secondary | ICD-10-CM | POA: Insufficient documentation

## 2024-08-27 DIAGNOSIS — D500 Iron deficiency anemia secondary to blood loss (chronic): Secondary | ICD-10-CM | POA: Diagnosis present

## 2024-08-27 DIAGNOSIS — E1122 Type 2 diabetes mellitus with diabetic chronic kidney disease: Secondary | ICD-10-CM | POA: Insufficient documentation

## 2024-08-27 DIAGNOSIS — N390 Urinary tract infection, site not specified: Secondary | ICD-10-CM | POA: Insufficient documentation

## 2024-08-27 DIAGNOSIS — Z79899 Other long term (current) drug therapy: Secondary | ICD-10-CM | POA: Insufficient documentation

## 2024-08-27 DIAGNOSIS — R531 Weakness: Secondary | ICD-10-CM | POA: Diagnosis present

## 2024-08-27 DIAGNOSIS — D649 Anemia, unspecified: Principal | ICD-10-CM

## 2024-08-27 LAB — COMPREHENSIVE METABOLIC PANEL WITH GFR
ALT: 11 U/L (ref 0–44)
AST: 18 U/L (ref 15–41)
Albumin: 3.8 g/dL (ref 3.5–5.0)
Alkaline Phosphatase: 82 U/L (ref 38–126)
Anion gap: 15 (ref 5–15)
BUN: 50 mg/dL — ABNORMAL HIGH (ref 8–23)
CO2: 22 mmol/L (ref 22–32)
Calcium: 9.4 mg/dL (ref 8.9–10.3)
Chloride: 90 mmol/L — ABNORMAL LOW (ref 98–111)
Creatinine, Ser: 1.91 mg/dL — ABNORMAL HIGH (ref 0.44–1.00)
GFR, Estimated: 24 mL/min — ABNORMAL LOW
Glucose, Bld: 274 mg/dL — ABNORMAL HIGH (ref 70–99)
Potassium: 4.6 mmol/L (ref 3.5–5.1)
Sodium: 127 mmol/L — ABNORMAL LOW (ref 135–145)
Total Bilirubin: 0.3 mg/dL (ref 0.0–1.2)
Total Protein: 7 g/dL (ref 6.5–8.1)

## 2024-08-27 LAB — CBC WITH DIFFERENTIAL/PLATELET
Abs Immature Granulocytes: 0.04 K/uL (ref 0.00–0.07)
Basophils Absolute: 0.1 K/uL (ref 0.0–0.1)
Basophils Relative: 1 %
Eosinophils Absolute: 0.2 K/uL (ref 0.0–0.5)
Eosinophils Relative: 4 %
HCT: 21 % — ABNORMAL LOW (ref 36.0–46.0)
Hemoglobin: 6.9 g/dL — ABNORMAL LOW (ref 12.0–15.0)
Immature Granulocytes: 1 %
Lymphocytes Relative: 14 %
Lymphs Abs: 0.9 K/uL (ref 0.7–4.0)
MCH: 30 pg (ref 26.0–34.0)
MCHC: 32.9 g/dL (ref 30.0–36.0)
MCV: 91.3 fL (ref 80.0–100.0)
Monocytes Absolute: 0.5 K/uL (ref 0.1–1.0)
Monocytes Relative: 7 %
Neutro Abs: 4.6 K/uL (ref 1.7–7.7)
Neutrophils Relative %: 73 %
Platelets: 315 K/uL (ref 150–400)
RBC: 2.3 MIL/uL — ABNORMAL LOW (ref 3.87–5.11)
RDW: 14.3 % (ref 11.5–15.5)
WBC: 6.2 K/uL (ref 4.0–10.5)
nRBC: 0 % (ref 0.0–0.2)

## 2024-08-27 LAB — IRON AND TIBC
Iron: 25 ug/dL — ABNORMAL LOW (ref 28–170)
Saturation Ratios: 7 % — ABNORMAL LOW (ref 10.4–31.8)
TIBC: 347 ug/dL (ref 250–450)
UIBC: 322 ug/dL

## 2024-08-27 LAB — PREPARE RBC (CROSSMATCH)

## 2024-08-27 LAB — FERRITIN: Ferritin: 37 ng/mL (ref 11–307)

## 2024-08-27 LAB — PROTIME-INR
INR: 1 (ref 0.8–1.2)
Prothrombin Time: 13.7 s (ref 11.4–15.2)

## 2024-08-27 MED ORDER — OXYCODONE HCL 5 MG PO TABS
5.0000 mg | ORAL_TABLET | ORAL | Status: DC | PRN
  Administered 2024-08-28: 5 mg via ORAL
  Filled 2024-08-27: qty 1

## 2024-08-27 MED ORDER — POLYETHYLENE GLYCOL 3350 17 G PO PACK: 17.0000 g | PACK | Freq: Every day | ORAL | Status: DC | PRN

## 2024-08-27 MED ORDER — PANTOPRAZOLE SODIUM 40 MG IV SOLR
40.0000 mg | Freq: Two times a day (BID) | INTRAVENOUS | Status: DC
  Administered 2024-08-28 – 2024-08-29 (×3): 40 mg via INTRAVENOUS
  Filled 2024-08-27 (×3): qty 10

## 2024-08-27 MED ORDER — ONDANSETRON HCL 4 MG/2ML IJ SOLN: 4.0000 mg | Freq: Four times a day (QID) | INTRAMUSCULAR | Status: DC | PRN

## 2024-08-27 MED ORDER — ACETAMINOPHEN 650 MG RE SUPP: 650.0000 mg | Freq: Four times a day (QID) | RECTAL | Status: DC | PRN

## 2024-08-27 MED ORDER — PANTOPRAZOLE SODIUM 40 MG IV SOLR
40.0000 mg | Freq: Once | INTRAVENOUS | Status: AC
  Administered 2024-08-27: 40 mg via INTRAVENOUS
  Filled 2024-08-27: qty 10

## 2024-08-27 MED ORDER — ACETAMINOPHEN 325 MG PO TABS
650.0000 mg | ORAL_TABLET | Freq: Four times a day (QID) | ORAL | Status: DC | PRN
  Administered 2024-08-29: 650 mg via ORAL
  Filled 2024-08-27: qty 2

## 2024-08-27 MED ORDER — ONDANSETRON HCL 4 MG PO TABS: 4.0000 mg | ORAL_TABLET | Freq: Four times a day (QID) | ORAL | Status: DC | PRN

## 2024-08-27 MED ORDER — SODIUM CHLORIDE 0.9% IV SOLUTION: Freq: Once | INTRAVENOUS | Status: AC

## 2024-08-27 NOTE — H&P (Signed)
 " History and Physical    [name] FMW:[id number] DOB: 1927-10-17 DOA: 08/27/2024 DOS: the patient was seen and examined on 08/28/2024 PCP: Alla Amis, MD  Patient coming from: Home  Chief Complaint:  Chief Complaint  Patient presents with   Abnormal Lab   HPI: Lisa Mcneil is a 89 y.o. 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  There was an unsuccessful attempt to set up an outpatient transfusion.  Patient then came to the ED for treatment  Patient accompanied by grandson and grandson's wife.  Patient lives alone and is very independent at baseline.  Does not use a walker or cane to ambulate.  Patient's main complaint is that she is feeling cold.  Denies weakness, falls.  Reports melanotic stools.  Denies vomiting..  Reports feeling cold, which is likely secondary to anemia. Denies increased weakness or falls. Lives alone and is independent with activities of daily living and medication management. Reports dark, purplish stools. Denies vomiting.    Review of Systems: As mentioned in the history of present illness. All other systems reviewed and are negative. Past Medical History:  Diagnosis Date   CHF (congestive heart failure) (HCC)    Diabetes mellitus without complication (HCC)    GERD (gastroesophageal reflux disease)    Hypertension    Insomnia    Iron  deficiency anemia    RLS (restless legs syndrome)    Past Surgical History:  Procedure Laterality Date   BLADDER REPAIR     CATARACT EXTRACTION W/PHACO Right 05/13/2016   Procedure: CATARACT EXTRACTION PHACO AND INTRAOCULAR LENS PLACEMENT (IOC);  Surgeon: Elsie Carmine, MD;  Location: [facility] ORS;  Service: Ophthalmology;  Laterality: Right;  US  1.09 AP% 21.0 CDE 14.60 Fluid Pack Lot # [id number]   CATARACT  EXTRACTION W/PHACO Left 07/15/2016   Procedure: CATARACT EXTRACTION PHACO AND INTRAOCULAR LENS PLACEMENT (IOC);  Surgeon: Elsie Carmine, MD;  Location: [facility] ORS;  Service: Ophthalmology;  Laterality: Left;  Lot# [id number] US : 01:17.9 AP%:27.8 CDE: 21.67    ESOPHAGOGASTRODUODENOSCOPY (EGD) WITH PROPOFOL  N/A 03/26/2023   Procedure: ESOPHAGOGASTRODUODENOSCOPY (EGD) WITH PROPOFOL ;  Surgeon: Jinny Carmine, MD;  Location: [facility] ENDOSCOPY;  Service: Endoscopy;  Laterality: N/A;   HEMOSTASIS CLIP PLACEMENT  03/26/2023   Procedure: HEMOSTASIS CLIP PLACEMENT;  Surgeon: Jinny Carmine, MD;  Location: [facility] ENDOSCOPY;  Service: Endoscopy;;   HEMOSTASIS CONTROL  03/26/2023   Procedure: HEMOSTASIS CONTROL;  Surgeon: Jinny Carmine, MD;  Location: [facility] ENDOSCOPY;  Service: Endoscopy;;   HOT HEMOSTASIS  03/26/2023   Procedure: HOT HEMOSTASIS (ARGON PLASMA COAGULATION/BICAP);  Surgeon: Jinny Carmine, MD;  Location: [facility] ENDOSCOPY;  Service: Endoscopy;;   Social History:  reports that she has quit smoking. Her smoking use included cigarettes. She has never used smokeless tobacco. She reports that she does not drink alcohol and does not use drugs.  Allergies[1]  Family History  Problem Relation Age of Onset   Hypertension Father    Heart disease Father     Prior to Admission medications  Medication Sig Start Date End Date Taking? Authorizing Provider  allopurinol  (ZYLOPRIM ) 100 MG tablet Take 0.5 tablets (50 mg total) by mouth daily. 09/19/22  Yes Josette Ade, MD  aspirin  EC 81 MG tablet Take 1 tablet (81 mg total) by mouth daily. Swallow whole. 06/15/22  Yes Caleen Qualia, MD  carvedilol  (  COREG ) 3.125 MG tablet Take 1 tablet (3.125 mg total) by mouth 2 (two) times daily with a meal. 09/16/22  Yes Wieting, Richard, MD  empagliflozin  (JARDIANCE ) 10 MG TABS tablet Take 10 mg by mouth daily. 07/12/24  Yes [provider]  ferrous sulfate  325 (65 FE) MG tablet Take 325 mg by mouth daily.   Yes [provider]  gabapentin  (NEURONTIN ) 100 MG capsule Take 100 mg by mouth at bedtime.   Yes [provider]  Melatonin 10 MG TABS Take 20 mg by mouth at bedtime.   Yes [provider]  Multiple Vitamins-Minerals (MULTIVITAMIN WITH MINERALS) tablet Take 1 tablet by mouth daily.   Yes [provider]  nitrofurantoin, macrocrystal-monohydrate, (MACROBID) 100 MG capsule Take 100 mg by mouth 2 (two) times daily. 08/25/24 08/30/24 Yes [provider]  omeprazole (PRILOSEC) 20 MG capsule Take 20 mg by mouth daily.   Yes [provider]  rosuvastatin  (CRESTOR ) 5 MG tablet Take 5 mg by mouth at bedtime.   Yes [provider]  traZODone  (DESYREL ) 50 MG tablet Take 50 mg by mouth at bedtime.   Yes [provider]  Dextromethorphan -guaiFENesin  20-200 MG/20ML LIQD Take 10 mLs by mouth every 4 (four) hours as needed. Patient not taking: Reported on 11/24/2023 10/12/23   Darci Pore, MD  furosemide  (LASIX ) 40 MG tablet Take 1 tablet (40 mg total) by mouth 2 (two) times daily. 09/16/22 11/24/23  Josette Ade, MD  isosorbide  mononitrate (IMDUR ) 60 MG 24 hr tablet Take by mouth daily. Patient not taking: Reported on 08/27/2024 07/22/23   [provider]  neomycin -polymyxin b-dexamethasone  (MAXITROL ) 3.5-10000-0.1 SUSP Place 1 drop into the right eye 3 (three) times daily. Patient not taking: Reported on 10/09/2023 03/11/23   [provider]  nitroGLYCERIN  (NITROSTAT ) 0.4 MG SL tablet Place 1 tablet (0.4 mg total) under the tongue every 5 (five) minutes as needed for chest pain. Patient not taking: Reported on 10/09/2023 06/15/22   Caleen Qualia, MD  pramipexole  (MIRAPEX ) 0.25 MG tablet Take 0.25 mg by mouth in the morning and at bedtime. Patient not taking: Reported on 08/27/2024 06/16/22   [provider]    Physical Exam: Vitals:   08/27/24 2130 08/27/24 2149 08/27/24 2248 08/28/24 0054  BP: (!) 151/52 (!) 158/54 (!)  154/44 (!) 153/48  Pulse: 68 72 63 66  Resp: (!) 23 (!) 24 (!) 24 16  Temp: 98.4 F (36.9 C) 98 F (36.7 C) 97.8 F (36.6 C) 98.6 F (37 C)  TempSrc: Oral Oral Oral   SpO2: 99% 99% 100% 96%  Weight:      Height:       General: Alert, oriented HEENT: PERRLA, EOMI, conjunctival pallor CV: Regular rate and rhythm no murmurs Pulmonary: Lungs clear bilaterally GI: Soft, nontender.  Normal bowel sounds MSK: Strength equal bilaterally, Neuro: Cranial nerves II through XII grossly intact.   Extremities: No pedal edema, 2+ DP pulses bilaterally  Assessment and Plan: * Blood loss anemia Patient with multiple weeks of melanotic stool.  Hemoglobin 6.9 in the ED.  1 unit packed red blood cells was ordered in the ED.  Gastroenterology was consulted and they would see patient in the a.m., discussed possibility of EGD for bleeding ulcer which patient has had in the past. - 1 unit packed red blood cells, recheck hemoglobin after.  Goal should be greater than 8 g/dL given her cardiac history - IV pantoprazole  twice daily - N.p.o. - Consult gastroenterology. - AM CBC  Chronic heart failure with preserved ejection fraction (HFpEF) (HCC) Does not appear to be in acute exacerbation.  Administering blood products at slow rate.  CKD (chronic kidney disease) stage 4, GFR 15-29 ml/min (HCC) Appears to be at baseline.  Continue to monitor.  Renally dose medications as appropriate.   Chronic stable conditions Gout, CHF, CAD, insomnia, - Holding home meds while n.p.o.   Advance Care Planning:   Code Status: Full Code   Consults: Gastroenterology  Family Communication: Son, Callia Swim, is dedicated decision maker if patient is unable.  Severity of Illness: The appropriate patient status for this patient is OBSERVATION. Observation status is judged to be reasonable and necessary in order to provide the required intensity of service to ensure the patient's safety. The patient's presenting  symptoms, physical exam findings, and initial radiographic and laboratory data in the context of their medical condition is felt to place them at decreased risk for further clinical deterioration. Furthermore, it is anticipated that the patient will be medically stable for discharge from the hospital within 2 midnights of admission.   Author: Toribio MARLA Slain, MD 08/28/2024 12:56 AM  For on call review [email or web address].      [1]  Allergies Allergen Reactions   Benadryl  [Diphenhydramine ] Other (See Comments)    Severe restless legs   Atorvastatin Other (See Comments)   Codeine    Demerol [Meperidine]    Naproxen Rash    Caused rash on mouth   "

## 2024-08-27 NOTE — ED Notes (Signed)
 Pt family member Amy Alanda will pick the pt up when it is time for discharge. Able to be reached @ [phone].

## 2024-08-27 NOTE — ED Provider Notes (Signed)
 "  [facility] Provider Note    Event Date/Time   First MD Initiated Contact with Patient 08/27/24 1806     (approximate)   History   Abnormal Lab  Patient had labs drawn and was told to come to ER on Thursday but patient misunderstood directions. Family found out patients HGB was 6. Patient c/o feeling weaker.  Denies SOB  A&O x4 during triage   HPI Lisa Mcneil is a 89 y.o. female PMH CHF, diabetes, hypertension, iron  deficiency anemia, GERD, CKD, severe aortic stenosis presents for anemia - Patient had outpatient labs showing acute hemoglobin drop performed 2 days ago.  Supposed to follow-up for transfusion in outpatient setting though missed this and now comes to emergency department. - Does note that she has generalized fatigue over the past week or so and also notes she has been having black stools over the past week.  Denies any abdominal pain.  Not on any blood thinners.  Denies any hematemesis, hematuria, vaginal bleeding.  Does take iron  but does not normally have dark stools. - Has otherwise been in her usual state of health - Confirms endoscopy would be within goals of care if indicated   Per chart review, patient was seen in internal medicine clinic on 08/25/2024 for increasing weakness and fatigue over the past 1-2 weeks.  Does have a history of blood transfusions.  Appears baseline hemoglobin is around 12.5.  She is on ferrous sulfate .  Not on blood thinners.  Hemoglobin found to have dropped to 6.7.  Attempted to arrange outpatient transfusion though appears this was not possible so sent to ED.  Was also seen in hematology-oncology clinic on 01/12/2024.  Noted to have iron  deficiency anemia.  Did note that an EGD in 2024 revealed actively bleeding duodenal lesion, cauterized.  Do suspect possible recurrent bleed, referred back to GI.     Physical Exam   Triage Vital Signs: ED Triage Vitals  Encounter Vitals Group     BP 08/27/24 1754  (!) 151/47     Girls Systolic BP Percentile --      Girls Diastolic BP Percentile --      Boys Systolic BP Percentile --      Boys Diastolic BP Percentile --      Pulse Rate 08/27/24 1754 74     Resp 08/27/24 1754 20     Temp 08/27/24 1754 97.8 F (36.6 C)     Temp Source 08/27/24 1754 Oral     SpO2 08/27/24 1754 99 %     Weight 08/27/24 1751 118 lb (53.5 kg)     Height 08/27/24 1751 5' 2 (1.575 m)     Head Circumference --      Peak Flow --      Pain Score 08/27/24 1751 0     Pain Loc --      Pain Education --      Exclude from Growth Chart --     Most recent vital signs: Vitals:   08/27/24 1754  BP: (!) 151/47  Pulse: 74  Resp: 20  Temp: 97.8 F (36.6 C)  SpO2: 99%     General: Awake, no distress. + Subconjunctival pallor. CV:  Good peripheral perfusion. RRR, RP 2+ Resp:  Normal effort. CTAB Abd:  No distention. Nontender to deep palpation throughout Rectal:  Chaperoned rectal exam with very dark stool, Hemoccult positive.  No frank blood.   ED Results / Procedures / Treatments   Labs (all labs ordered are  listed, but only abnormal results are displayed) Labs Reviewed  CBC WITH DIFFERENTIAL/PLATELET - Abnormal; Notable for the following components:      Result Value   RBC 2.30 (*)    Hemoglobin 6.9 (*)    HCT 21.0 (*)    All other components within normal limits  COMPREHENSIVE METABOLIC PANEL WITH GFR - Abnormal; Notable for the following components:   Sodium 127 (*)    Chloride 90 (*)    Glucose, Bld 274 (*)    BUN 50 (*)    Creatinine, Ser 1.91 (*)    GFR, Estimated 24 (*)    All other components within normal limits  IRON  AND TIBC - Abnormal; Notable for the following components:   Iron  25 (*)    Saturation Ratios 7 (*)    All other components within normal limits  PROTIME-INR  FERRITIN  TYPE AND SCREEN  PREPARE RBC (CROSSMATCH)     EKG  N/a   RADIOLOGY N/a    PROCEDURES:  Critical Care performed: Yes, see critical care procedure  note(s)  .Critical Care  Performed by: Clarine Ozell LABOR, MD Authorized by: Clarine Ozell LABOR, MD   Critical care provider statement:    Critical care time (minutes):  30   Critical care time was exclusive of:  Separately billable procedures and treating other patients   Critical care was necessary to treat or prevent imminent or life-threatening deterioration of the following conditions:  Circulatory failure (Anemia requiring transfusion)   Critical care was time spent personally by me on the following activities:  Development of treatment plan with patient or surrogate, discussions with consultants, evaluation of patient's response to treatment, examination of patient, ordering and review of laboratory studies, ordering and review of radiographic studies, ordering and performing treatments and interventions, pulse oximetry, re-evaluation of patient's condition and review of old charts   I assumed direction of critical care for this patient from another provider in my specialty: no     Care discussed with: admitting provider      MEDICATIONS ORDERED IN ED: Medications  0.9 %  sodium chloride  infusion (Manually program via Guardrails IV Fluids) (has no administration in time range)  pantoprazole  (PROTONIX ) injection 40 mg (40 mg Intravenous Given 08/27/24 2044)     IMPRESSION / MDM / ASSESSMENT AND PLAN / ED COURSE  I reviewed the triage vital signs and the nursing notes.                              DDX/MDM/AP: Differential diagnosis includes, but is not limited to, high concern for upper GI bleed given report of melena over the past week and prior history of duodenal ulcer.  Fortunately hemodynamically stable at this time.  Consider iron  deficiency, aortic stenosis also contributing to anemia.  Given hemoglobin less than 7 and symptomatic, will plan for transfusion and discuss with GI.  Plan: - Blood transfusion (patient consented) - Protonix  - Will discuss with GI - Anticipate  admission  Patient's presentation is most consistent with acute presentation with potential threat to life or bodily function.  The patient is on the cardiac monitor to evaluate for evidence of arrhythmia and/or significant heart rate changes.  ED course below.  Workup with stable anemia though down about 6 points from 1 month ago.  Stable CKD though BUN worse than usual.  Some hyponatremia though in the setting of hyperglycemia, no evidence of DKA and hyperglycemia is not profound  at this time.  Discussed with GI who will evaluate patient in the morning.  Infusing 1 unit PRBCs.  Admitted to hospitalist service.  Clinical Course as of 08/27/24 2057  Sat Aug 27, 2024  2017 Cmp w/ hyponatremiat to 127 though moderate hyperglycemia  Cr w/in baseline range though BUN notably elevated from baseline [MM]  2022 Paging GI to discuss Ordering Protonix  [MM]  2025 Discussed with Dr. Therisa of GI They will eval patient in a.m.  Hospitalist consult order placed [MM]    Clinical Course User Index [MM] Clarine Ozell LABOR, MD     FINAL CLINICAL IMPRESSION(S) / ED DIAGNOSES   Final diagnoses:  Anemia, unspecified type  Melena     Rx / DC Orders   ED Discharge Orders     None        Note:  This document was prepared using Dragon voice recognition software and may include unintentional dictation errors.   Clarine Ozell LABOR, MD 08/27/24 2057  "

## 2024-08-27 NOTE — H&P (Signed)
 SABRA

## 2024-08-27 NOTE — ED Notes (Signed)
 Pt ambulated to in room toilet and back to bed to urinate with walker and 1 assist.

## 2024-08-27 NOTE — ED Triage Notes (Signed)
 Patient had labs drawn and was told to come to ER on Thursday but patient misunderstood directions. Family found out patients HGB was 6. Patient c/o feeling weaker.  Denies SOB  A&O x4 during triage

## 2024-08-27 NOTE — ED Notes (Signed)
 This tech answered pt call light. Pt stated, my shoulder is hurting really bad and I can't reach over to grab it. Pt said the pain was on the right shoulder, and family stated, it just now started. RN made aware of pt concern.

## 2024-08-28 ENCOUNTER — Other Ambulatory Visit: Payer: Self-pay

## 2024-08-28 ENCOUNTER — Encounter: Payer: Self-pay | Admitting: Family Medicine

## 2024-08-28 DIAGNOSIS — D500 Iron deficiency anemia secondary to blood loss (chronic): Secondary | ICD-10-CM | POA: Diagnosis not present

## 2024-08-28 LAB — COMPREHENSIVE METABOLIC PANEL WITH GFR
ALT: 8 U/L (ref 0–44)
AST: 15 U/L (ref 15–41)
Albumin: 3.5 g/dL (ref 3.5–5.0)
Alkaline Phosphatase: 76 U/L (ref 38–126)
Anion gap: 11 (ref 5–15)
BUN: 46 mg/dL — ABNORMAL HIGH (ref 8–23)
CO2: 24 mmol/L (ref 22–32)
Calcium: 9.5 mg/dL (ref 8.9–10.3)
Chloride: 95 mmol/L — ABNORMAL LOW (ref 98–111)
Creatinine, Ser: 1.84 mg/dL — ABNORMAL HIGH (ref 0.44–1.00)
GFR, Estimated: 25 mL/min — ABNORMAL LOW
Glucose, Bld: 191 mg/dL — ABNORMAL HIGH (ref 70–99)
Potassium: 3.8 mmol/L (ref 3.5–5.1)
Sodium: 130 mmol/L — ABNORMAL LOW (ref 135–145)
Total Bilirubin: 0.5 mg/dL (ref 0.0–1.2)
Total Protein: 6.6 g/dL (ref 6.5–8.1)

## 2024-08-28 LAB — URINALYSIS, COMPLETE (UACMP) WITH MICROSCOPIC
Bilirubin Urine: NEGATIVE
Glucose, UA: NEGATIVE mg/dL
Hgb urine dipstick: NEGATIVE
Ketones, ur: NEGATIVE mg/dL
Nitrite: NEGATIVE
Protein, ur: 30 mg/dL — AB
Specific Gravity, Urine: 1.013 (ref 1.005–1.030)
WBC, UA: >50 WBC/hpf (ref 0–5)
pH: 6 (ref 5.0–8.0)

## 2024-08-28 LAB — BPAM RBC
Blood Product Expiration Date: 202601312359
ISSUE DATE / TIME: 202601172126
Unit Type and Rh: 600

## 2024-08-28 LAB — CBC
HCT: 24.8 % — ABNORMAL LOW (ref 36.0–46.0)
Hemoglobin: 8.3 g/dL — ABNORMAL LOW (ref 12.0–15.0)
MCH: 29.3 pg (ref 26.0–34.0)
MCHC: 33.5 g/dL (ref 30.0–36.0)
MCV: 87.6 fL (ref 80.0–100.0)
Platelets: 265 K/uL (ref 150–400)
RBC: 2.83 MIL/uL — ABNORMAL LOW (ref 3.87–5.11)
RDW: 15.2 % (ref 11.5–15.5)
WBC: 7.3 K/uL (ref 4.0–10.5)
nRBC: 0 % (ref 0.0–0.2)

## 2024-08-28 LAB — PROTIME-INR
INR: 1 (ref 0.8–1.2)
Prothrombin Time: 14.1 s (ref 11.4–15.2)

## 2024-08-28 LAB — TYPE AND SCREEN
ABO/RH(D): A POS
Antibody Screen: NEGATIVE
Unit division: 0

## 2024-08-28 MED ORDER — EMPAGLIFLOZIN 10 MG PO TABS
10.0000 mg | ORAL_TABLET | Freq: Every day | ORAL | Status: DC
  Administered 2024-08-29: 10 mg via ORAL
  Filled 2024-08-28: qty 1

## 2024-08-28 MED ORDER — CARVEDILOL 6.25 MG PO TABS
3.1250 mg | ORAL_TABLET | Freq: Two times a day (BID) | ORAL | Status: DC
  Administered 2024-08-29: 3.125 mg via ORAL
  Filled 2024-08-28 (×2): qty 1

## 2024-08-28 MED ORDER — FUROSEMIDE 40 MG PO TABS
40.0000 mg | ORAL_TABLET | Freq: Two times a day (BID) | ORAL | Status: DC
  Administered 2024-08-28 – 2024-08-29 (×3): 40 mg via ORAL
  Filled 2024-08-28 (×3): qty 1

## 2024-08-28 MED ORDER — GABAPENTIN 100 MG PO CAPS
100.0000 mg | ORAL_CAPSULE | Freq: Every day | ORAL | Status: DC
  Administered 2024-08-28: 100 mg via ORAL
  Filled 2024-08-28: qty 1

## 2024-08-28 MED ORDER — ORAL CARE MOUTH RINSE: 15.0000 mL | OROMUCOSAL | Status: DC | PRN

## 2024-08-28 MED ORDER — MELATONIN 5 MG PO TABS
20.0000 mg | ORAL_TABLET | Freq: Every day | ORAL | Status: DC
  Administered 2024-08-28: 20 mg via ORAL
  Filled 2024-08-28: qty 4

## 2024-08-28 MED ORDER — SODIUM CHLORIDE 0.9 % IV SOLN
1.0000 g | INTRAVENOUS | Status: DC
  Administered 2024-08-28: 1 g via INTRAVENOUS
  Filled 2024-08-28 (×2): qty 10

## 2024-08-28 NOTE — Progress Notes (Signed)
 Mobility Specialist Progress Note:    08/28/24 1520  Mobility  Activity Ambulated with assistance  Level of Assistance Standby assist, set-up cues, supervision of patient - no hands on  Assistive Device Front wheel walker  Distance Ambulated (ft) 20 ft  Range of Motion/Exercises Active;All extremities  Activity Response Tolerated well  Mobility visit 1 Mobility  Mobility Specialist Start Time (ACUTE ONLY) 1509  Mobility Specialist Stop Time (ACUTE ONLY) 1520  Mobility Specialist Time Calculation (min) (ACUTE ONLY) 11 min   Pt received requesting assistance. Required supervision to stand and ambulate with RW. Tolerated well, asx throughout. Returned to chair, alarm on and belongings in reach. All needs met.  Sherrilee Ditty Mobility Specialist Please contact via Special Educational Needs Teacher or  Rehab office at [phone]

## 2024-08-28 NOTE — Assessment & Plan Note (Signed)
 Does not appear to be in acute exacerbation.  Administering blood products at slow rate.

## 2024-08-28 NOTE — Assessment & Plan Note (Signed)
 Patient with multiple weeks of melanotic stool.  Hemoglobin 6.9 in the ED.  1 unit packed red blood cells was ordered in the ED.  Gastroenterology was consulted and they would see patient in the a.m., discussed possibility of EGD for bleeding ulcer which patient has had in the past. - 1 unit packed red blood cells, recheck hemoglobin after.  Goal should be greater than 8 g/dL given her cardiac history - IV pantoprazole  twice daily - N.p.o. - Consult gastroenterology. - AM CBC

## 2024-08-28 NOTE — Progress Notes (Addendum)
 " Progress Note   [name] FMW:[id number] DOB: [birth year] DOA: 08/27/2024     0 DOS: the patient was seen and examined on 08/28/2024   Brief hospital course: From HPI Tauheedah Bok is a 89 y.o. 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  There was an unsuccessful attempt to set up an outpatient transfusion.  Patient then came to the ED for treatment   Patient accompanied by grandson and grandson's wife.  Patient lives alone and is very independent at baseline.  Does not use a walker or cane to ambulate.  Patient's main complaint is that she is feeling cold.  Denies weakness, falls.  Reports melanotic stools.  Denies vomiting..   Reports feeling cold, which is likely secondary to anemia. Denies increased weakness or falls. Lives alone and is independent with activities of daily living and medication management. Reports dark, purplish stools. Denies vomiting.      Assessment and Plan: Acute blood loss anemia secondary to GI bleed type unknown Patient with multiple weeks of melanotic stool.  Hemoglobin 6.9 in the ED.  - IV pantoprazole  twice daily Case discussed with gastroenterologist Patient pending endoscopic intervention Status post 1 unit of blood transfusion We will keep hemoglobin greater than 8 in the setting of CHF   Urinary tract infection UA showed UTI and patient had symptoms including frequency and urgency Patient placed on ceftriaxone   Chronic heart failure with preserved ejection fraction (HFpEF) (HCC) Monitor input and output as well as daily weight Continue home dose of carvedilol  and Lasix    CKD (chronic kidney disease) stage 4, GFR 15-29 ml/min (HCC) Appears to be at baseline.   Monitor renal function closely   Chronic stable conditions Gout, CHF, CAD,  insomnia,     Advance Care Planning:   Code Status: Full Code    Consults: Gastroenterology   Family Communication: Son, Michelene Keniston, is dedicated decision maker if patient is unable.    Subjective:  Seen and examined at bedside this morning Denies any bright red bleeding per rectum   Physical Exam: General: Alert, oriented HEENT: PERRLA, EOMI, conjunctival pallor CV: Regular rate and rhythm no murmurs Pulmonary: Lungs clear bilaterally GI: Soft, nontender.  Normal bowel sounds MSK: Strength equal bilaterally, Neuro: Cranial nerves II through XII grossly intact.   Extremities: No pedal edema, 2+ DP pulses bilaterally  Vitals:   08/27/24 2248 08/28/24 0054 08/28/24 0328 08/28/24 0753  BP: (!) 154/44 (!) 153/48 (!) 126/56 (!) 149/50  Pulse: 63 66 69 74  Resp: (!) 24 16 16 16   Temp: 97.8 F (36.6 C) 98.6 F (37 C) 98.3 F (36.8 C) 98.2 F (36.8 C)  TempSrc: Oral     SpO2: 100% 96% 95% 92%  Weight:      Height:        Data Reviewed:    Latest Ref Rng & Units 08/28/2024    3:37 AM 08/27/2024    5:53 PM 08/25/2024    2:35 PM  CBC  WBC 4.0 - 10.5 K/uL 7.3  6.2  8.8   Hemoglobin 12.0 - 15.0 g/dL 8.3  6.9  7.1   Hematocrit 36.0 - 46.0 % 24.8  21.0  21.8   Platelets 150 - 400 K/uL 265  315  264        Latest  Ref Rng & Units 08/28/2024    3:37 AM 08/27/2024    5:53 PM 10/12/2023    4:30 AM  BMP  Glucose 70 - 99 mg/dL 808  725  771   BUN 8 - 23 mg/dL 46  50  48   Creatinine 0.44 - 1.00 mg/dL 8.15  8.08  8.22   Sodium 135 - 145 mmol/L 130  127  136   Potassium 3.5 - 5.1 mmol/L 3.8  4.6  4.6   Chloride 98 - 111 mmol/L 95  90  103   CO2 22 - 32 mmol/L 24  22  23    Calcium  8.9 - 10.3 mg/dL 9.5  9.4  9.7    Family Communication: Not present at bedside  Disposition:Pending clearance by GI  Time spent: 51 minutes  Author: Drue ONEIDA Potter, MD 08/28/2024 3:30 PM  For on call review [email or web address].  "

## 2024-08-28 NOTE — Evaluation (Signed)
 Occupational Therapy Evaluation Patient Details Name: Lisa Mcneil MRN: [id number] DOB: Jan 30, 1928 Today's Date: 08/28/2024   History of Present Illness   89 y.o. 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     Clinical Impressions Pt was seen for OT evaluation this date. Prior to hospital admission, pt was independent in all aspects, working part time as a theatre stage manager and driving. Pt lives alone in a level entry condo. Pt presents with deficits in activity tolerance, mild balance deficits, and decr BLE strength limiting their ability to perform ADL management at baseline level. Pt currently requires supervision for safety with toileting, dressing, and grooming at sink. Balance improved with RW. Pt notes that she hasn't been up walking much since her admission and is typically on her feet a lot so attributes mild balance deficits to that. Pt edu in positional changes and use of shower chair for showering, activity pacing, and work simplification to support safety and gradual return to prior habits/routines. Pt verbalized understanding. Pt would benefit from skilled OT services to address noted impairments and functional limitations (see below for any additional details) in order to maximize safety and independence while minimizing future risk of falls, injury, and readmission. Do not anticipate the need for follow up OT services upon acute hospital DC.     If plan is discharge home, recommend the following:   Assistance with cooking/housework;Assist for transportation     Functional Status Assessment   Patient has had a recent decline in their functional status and demonstrates the ability to make significant improvements in function in a reasonable and predictable amount of time.     Equipment  Recommendations   Other (comment) (2WW vs SPC per PT)     Recommendations for Other Services         Precautions/Restrictions   Precautions Precautions: Fall     Mobility Bed Mobility Overal bed mobility: Modified Independent                  Transfers       Sit to Stand: Supervision                  Balance Overall balance assessment: Needs assistance Sitting-balance support: No upper extremity supported, Feet supported Sitting balance-Leahy Scale: Good       Standing balance-Leahy Scale: Fair Standing balance comment: reaches outside BOS to steady herself at times                           ADL either performed or assessed with clinical judgement   ADL Overall ADL's : Modified independent                                       General ADL Comments: grossly supervision for toileting, LB dressing, grooming at sink     Vision         Perception         Praxis         Pertinent Vitals/Pain Pain Assessment Pain Assessment: No/denies pain     Extremity/Trunk Assessment Upper Extremity Assessment Upper Extremity Assessment: Defer to OT evaluation;Overall [facility] for tasks assessed   Lower Extremity Assessment  Lower Extremity Assessment: Generalized weakness   Cervical / Trunk Assessment Cervical / Trunk Assessment: Normal   Communication Communication Communication: No apparent difficulties   Cognition                                             Cueing  General Comments          Exercises Other Exercises Other Exercises: Pt edu in positional changes and use of shower chair for showering, activity pacing, and work simplification to support safety and gradual return to prior habits/routines   Shoulder Instructions      Home Living Family/patient expects to be discharged to:: Private residence Living Arrangements: Alone Available Help at Discharge: Available  PRN/intermittently Type of Home: Apartment (condo) Home Access: Level entry     Home Layout: One level     Bathroom Shower/Tub: Chief Strategy Officer: Standard Bathroom Accessibility: No   Home Equipment: Grab bars - toilet;Grab bars - tub/shower;Shower seat          Prior Functioning/Environment Prior Level of Function : Independent/Modified Independent;Working/employed;Driving             Mobility Comments: works part time as theatre stage manager at [employer], still driving ADLs Comments: independent    OT Problem List: Decreased strength;Impaired balance (sitting and/or standing);Decreased activity tolerance;Decreased knowledge of use of DME or AE   OT Treatment/Interventions: Self-care/ADL training;Therapeutic exercise;Therapeutic activities;Energy conservation;DME and/or AE instruction;Patient/family education;Balance training      OT Goals(Current goals can be found in the care plan section)   Acute Rehab OT Goals Patient Stated Goal: go home OT Goal Formulation: With patient Time For Goal Achievement: 09/11/24 Potential to Achieve Goals: Good   OT Frequency:  Min 2X/week    Co-evaluation              AM-PAC OT 6 Clicks Daily Activity     Outcome Measure Help from another person eating meals?: None Help from another person taking care of personal grooming?: None Help from another person toileting, which includes using toliet, bedpan, or urinal?: A Little Help from another person bathing (including washing, rinsing, drying)?: A Little Help from another person to put on and taking off regular upper body clothing?: None Help from another person to put on and taking off regular lower body clothing?: A Little 6 Click Score: 21   End of Session    Activity Tolerance: Patient tolerated treatment well Patient left: in bed;with call bell/phone within reach;Other (comment) (with PT)  OT Visit Diagnosis: Unsteadiness on feet (R26.81);Muscle  weakness (generalized) (M62.81)                Time: 1421-1440 OT Time Calculation (min): 19 min Charges:  OT General Charges $OT Visit: 1 Visit OT Evaluation $OT Eval Low Complexity: 1 Low OT Treatments $Self Care/Home Management : 8-22 mins  Warren SAUNDERS., MPH, MS, OTR/L ascom [phone] 08/28/24, 3:05 PM

## 2024-08-28 NOTE — Plan of Care (Signed)

## 2024-08-28 NOTE — Consult Note (Signed)
 "  Ruel Kung , MD [address], [place], [place], [address] Phone: [phone] Fax: [phone]  Consultation  Referring Provider:     No ref. provider found Primary Care Physician:  Alla Amis, MD Primary Gastroenterologist:  [facility]          Reason for Consultation:     GI bleed  Date of Admission:  08/27/2024 Date of Consultation:  08/28/2024         HPI:   Lisa Mcneil is a 89 y.o. female with a history of severe aortic stenosis CHF diabetes GERD CKD has followed with Dr. Jacobo in the past for iron  deficiency anemia she has had iron  deficiency since 2016 back in 2024 she had presented to the hospital with hemoglobin of 6.7 and MCV of 100.9She underwent an upper endoscopy on 03/26/2023 by Dr. 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  Baseline hemoglobin of around 9.1 g with an MCV of 100 admitted to the hospital yesterday with a hemoglobin of 6.9 and MCV of 91 BUN/creatinine ratio at baseline has had CKD INR 1.0 no outpatient follow-up she follows with cardiology and last cardiology note in December 2025 has a history of CHF NSTEMI CKD admitted to the hospital with a complaint of dark stool and was told by her PCP the hemoglobin was low patient lives alone is on ferrous sulfate  and I have been consulted for the anemia.  The patient her self denies any abdominal pain , nose bleeds, hematemesis or melena.  Past Medical History:  Diagnosis Date   CHF (congestive heart failure) (HCC)    Diabetes mellitus without complication (HCC)    GERD (gastroesophageal reflux disease)    Hypertension    Insomnia    Iron  deficiency anemia    RLS (restless legs syndrome)     Past Surgical History:  Procedure Laterality Date   BLADDER REPAIR     CATARACT EXTRACTION W/PHACO Right 05/13/2016   Procedure: CATARACT EXTRACTION PHACO AND INTRAOCULAR LENS PLACEMENT (IOC);  Surgeon: Elsie Carmine, MD;  Location: [facility] ORS;  Service:  Ophthalmology;  Laterality: Right;  US  1.09 AP% 21.0 CDE 14.60 Fluid Pack Lot # [id number]   CATARACT EXTRACTION W/PHACO Left 07/15/2016   Procedure: CATARACT EXTRACTION PHACO AND INTRAOCULAR LENS PLACEMENT (IOC);  Surgeon: Elsie Carmine, MD;  Location: [facility] OR[facility];  Service: Ophthalmology;  Laterality: Left;  Lot# [id number] US : 01:17.9 AP%:27.8 CDE: 21.67    ESOPHAGOGASTRODUODENOSCOPY (EGD) WITH PROPOFOL  N/A 03/26/2023   Procedure: ESOPHAGOGASTRODUODENOSCOPY (EGD) WITH PROPOFOL ;  Surgeon: Jinny Carmine, MD;  Location: [facility] ENDOSCOPY;  Service: Endoscopy;  Laterality: N/A;   HEMOSTASIS CLIP PLACEMENT  03/26/2023   Procedure: HEMOSTASIS CLIP PLACEMENT;  Surgeon: Jinny Carmine, MD;  Location: [facility] ENDOSCOPY;  Service: Endoscopy;;   HEMOSTASIS CONTROL  03/26/2023   Procedure: HEMOSTASIS CONTROL;  Surgeon: Jinny Carmine, MD;  Location: [facility] ENDOSCOPY;  Service: Endoscopy;;   HOT HEMOSTASIS  03/26/2023   Procedure: HOT HEMOSTASIS (ARGON PLASMA COAGULATION/BICAP);  Surgeon: Jinny Carmine, MD;  Location: [facility] ENDOSCOPY;  Service: Endoscopy;;    Prior to Admission medications  Medication Sig Start Date End Date Taking? Authorizing Provider  allopurinol  (ZYLOPRIM ) 100 MG tablet Take 0.5 tablets (50 mg total) by mouth daily. 09/19/22  Yes Josette Ade, MD  aspirin  EC 81 MG tablet Take 1 tablet (81 mg total) by mouth daily. Swallow whole. 06/15/22  Yes Amin, Sumayya, MD  carvedilol  (COREG ) 3.125 MG tablet Take 1 tablet (3.125 mg  total) by mouth 2 (two) times daily with a meal. 09/16/22  Yes Wieting, Richard, MD  empagliflozin  (JARDIANCE ) 10 MG TABS tablet Take 10 mg by mouth daily. 07/12/24  Yes [provider]  ferrous sulfate  325 (65 FE) MG tablet Take 325 mg by mouth daily.   Yes [provider]  gabapentin  (NEURONTIN ) 100 MG capsule Take 100 mg by mouth at bedtime.   Yes [provider]  Melatonin 10 MG TABS Take 20 mg by mouth at bedtime.   Yes [provider]  Multiple  Vitamins-Minerals (MULTIVITAMIN WITH MINERALS) tablet Take 1 tablet by mouth daily.   Yes [provider]  nitrofurantoin, macrocrystal-monohydrate, (MACROBID) 100 MG capsule Take 100 mg by mouth 2 (two) times daily. 08/25/24 08/30/24 Yes [provider]  omeprazole (PRILOSEC) 20 MG capsule Take 20 mg by mouth daily.   Yes [provider]  rosuvastatin  (CRESTOR ) 5 MG tablet Take 5 mg by mouth at bedtime.   Yes [provider]  traZODone  (DESYREL ) 50 MG tablet Take 50 mg by mouth at bedtime.   Yes [provider]  Dextromethorphan -guaiFENesin  20-200 MG/20ML LIQD Take 10 mLs by mouth every 4 (four) hours as needed. Patient not taking: Reported on 11/24/2023 10/12/23   Darci Pore, MD  furosemide  (LASIX ) 40 MG tablet Take 1 tablet (40 mg total) by mouth 2 (two) times daily. 09/16/22 11/24/23  Josette Ade, MD  isosorbide  mononitrate (IMDUR ) 60 MG 24 hr tablet Take by mouth daily. Patient not taking: Reported on 08/27/2024 07/22/23   [provider]  neomycin -polymyxin b-dexamethasone  (MAXITROL ) 3.5-10000-0.1 SUSP Place 1 drop into the right eye 3 (three) times daily. Patient not taking: Reported on 10/09/2023 03/11/23   [provider]  nitroGLYCERIN  (NITROSTAT ) 0.4 MG SL tablet Place 1 tablet (0.4 mg total) under the tongue every 5 (five) minutes as needed for chest pain. Patient not taking: Reported on 10/09/2023 06/15/22   Caleen Qualia, MD  pramipexole  (MIRAPEX ) 0.25 MG tablet Take 0.25 mg by mouth in the morning and at bedtime. Patient not taking: Reported on 08/27/2024 06/16/22   [provider]    Family History  Problem Relation Age of Onset   Hypertension Father    Heart disease Father      Social History[1]  Allergies as of 08/27/2024 - Review Complete 08/27/2024  Allergen Reaction Noted   Benadryl  [diphenhydramine ] Other (See Comments) 01/14/2024   Atorvastatin Other (See Comments) 06/15/2020   Codeine   02/16/2015   Demerol [meperidine]  02/16/2015   Naproxen Rash 02/16/2015    Review of Systems:    All systems reviewed and negative except where noted in HPI.   Physical Exam:  Vital signs in last 24 hours: Temp:  [97.8 F (36.6 C)-98.6 F (37 C)] 98.2 F (36.8 C) (01/18 0753) Pulse Rate:  [63-74] 74 (01/18 0753) Resp:  [16-24] 16 (01/18 0753) BP: (126-158)/(44-56) 149/50 (01/18 0753) SpO2:  [92 %-100 %] 92 % (01/18 0753) Weight:  [53.5 kg] 53.5 kg (01/17 1751) Last BM Date : 08/27/24 General:   Pleasant, cooperative in NAD Head:  Normocephalic and atraumatic. Eyes:   No icterus.   Conjunctiva pink. PERRLA. Ears:  Normal auditory acuity. Abdomen:  Soft, nondistended, nontender. Normal bowel sounds. No appreciable masses or hepatomegaly.  No rebound or guarding.  Neurologic:  Alert and oriented x3;  grossly normal neurologically. Psych:  Alert and cooperative. Normal affect.  LAB RESULTS: Recent Labs    08/25/24 1435 08/27/24 1753 08/28/24 0337  WBC 8.8  6.2 7.3  HGB 7.1* 6.9* 8.3*  HCT 21.8* 21.0* 24.8*  PLT 264 315 265   BMET Recent Labs    08/27/24 1753 08/28/24 0337  NA 127* 130*  K 4.6 3.8  CL 90* 95*  CO2 22 24  GLUCOSE 274* 191*  BUN 50* 46*  CREATININE 1.91* 1.84*  CALCIUM  9.4 9.5   LFT Recent Labs    08/28/24 0337  PROT 6.6  ALBUMIN  3.5  AST 15  ALT 8  ALKPHOS 76  BILITOT 0.5   PT/INR Recent Labs    08/27/24 1933 08/28/24 0337  LABPROT 13.7 14.1  INR 1.0 1.0    STUDIES: No results found.    Impression / Plan:   Lisa Mcneil is a 89 y.o. y/o female with a significant cardiac history consisting of severe aortic stenosis CHF, CKD known to have AVMs in the duodenum treated by APC in 2024 by Dr.Wohl.  Known to have iron  deficiency anemia for over 9 years.  On oral iron  presented to the hospital with hemoglobin of 6.9 g when her baseline is around 9 g as well as dark stool , the patient herself has not seen any bloood in her stool  or melena    Impression :I have consulted to see her for the same.  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     Generally the treatment for this is to treat the aortic stenosis otherwise the AVMs recurred and could be anywhere in the GI tract .  general anesthesia for the patient aged  41 with severe aortic stenosis would be high risk and the question would be if if we are going to perform GI evaluation it could include a EGD, colonoscopy, capsule study of the small bowel and further testing considering the risks.  Ferritin 37-  1 day back but iron  low at 25 and percentage saturation of 7 which is also low.     May benefit from iron  infusion also consider checking B12 folate and obtaining a cardiology consultation for risk stratification of anesthesia in her situation.  Based on the evaluation we can decide on options whether we want to proceed with endoscopy evaluation or get a capsule study as a first step to determine location of any AVMs and subsequently decide whether it would be worth for us  to perform an upper endoscopy or push enteroscopy or balloon enteroscopy at a tertiary center and avoid repeated anesthesia attempts.  In view of CHF, Sandostatin use may be contraindicated.  Wonder  if a TAVR procedure would be appropriate in her case considering the fact that the AVMs are going to recur unless we treat the underlying cause.I would also recommend to monitor her stool color   Thank you for involving me in the care of this patient.      LOS: 0 days   Ruel Kung, MD  08/28/2024, 11:23 AM         [1]  Social History Tobacco Use   Smoking status: Former    Types: Cigarettes   Smokeless tobacco: Never  Vaping Use   Vaping status: Never Used  Substance Use Topics   Alcohol use: No   Drug use: No   "

## 2024-08-28 NOTE — Assessment & Plan Note (Signed)
 Appears to be at baseline.  Continue to monitor.  Renally dose medications as appropriate.

## 2024-08-28 NOTE — Evaluation (Signed)
 Physical Therapy Evaluation Patient Details Name: Lisa Mcneil MRN: [id number] DOB: 09/23/1927 Today's Date: 08/28/2024  History of Present Illness  89 y.o. 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  Clinical Impression  Patient noted to be in seated position at PT arrival in room, for an initial PT evaluation due to a decline in functional status, with baseline mobility reported as independent, drives and works part-time as a theatre stage manager, and currently requiring CGA with RW for 200'. The patient is A&O x 4, presenting with great willingness to work with PT. The patient resides in a apartment and lives alone with family/friend support. There are no steps to enter inside the residence.  Vitals are stable. Gait was assessed with RW. Gait mechanic observations noted mild unsteady. The overall clinical impression is that the patient presents with mild to moderate mobility limitations compared to baseline. Recommended skilled PT will address safety, mobility, and discharge planning.        If plan is discharge home, recommend the following: A little help with walking and/or transfers;Help with stairs or ramp for entrance   Can travel by private vehicle        Equipment Recommendations Rolling walker (2 wheels)  Recommendations for Other Services       Functional Status Assessment Patient has had a recent decline in their functional status and demonstrates the ability to make significant improvements in function in a reasonable and predictable amount of time.     Precautions / Restrictions Restrictions Weight Bearing Restrictions Per Provider Order: No      Mobility  Bed Mobility                    Transfers Overall transfer level: Needs assistance (Simultaneous filing. User may not have seen  previous data.) Equipment used: Rolling walker (2 wheels) (Simultaneous filing. User may not have seen previous data.) Transfers: Sit to/from Stand (Simultaneous filing. User may not have seen previous data.)                  Ambulation/Gait Ambulation/Gait assistance: Contact guard assist Gait Distance (Feet): 200 Feet Assistive device: Rolling walker (2 wheels) Gait Pattern/deviations: Step-through pattern Gait velocity: decreased     General Gait Details: 100' w/o RW and 100' w/ RW; pt. expressed having increased fatigue compared to having RW due to weakness and steady  Careers Information Officer     Tilt Bed    Modified Rankin (Stroke Patients Only)       Balance Overall balance assessment: Needs assistance Sitting-balance support: No upper extremity supported, Feet supported Sitting balance-Leahy Scale: Good       Standing balance-Leahy Scale: Fair Standing balance comment: reaches outside BOS to steady herself at times                             Pertinent Vitals/Pain      Home Living Family/patient expects to be discharged to:: Private residence Living Arrangements: Alone Available Help at Discharge: Available PRN/intermittently Type of Home: Apartment (condo) Home Access: Level entry       Home Layout: One level Home Equipment: Grab bars - toilet;Grab bars - tub/shower;Shower seat  Prior Function Prior Level of Function : Independent/Modified Independent;Working/employed;Driving             Mobility Comments: works part time as theatre stage manager at [employer], still driving ADLs Comments: independent     Extremity/Trunk Assessment   Upper Extremity Assessment Upper Extremity Assessment: Defer to OT evaluation    Lower Extremity Assessment Lower Extremity Assessment: Generalized weakness    Cervical / Trunk Assessment Cervical / Trunk Assessment: Normal  Communication   Communication Communication:  No apparent difficulties    Cognition Arousal: Alert Behavior During Therapy: WFL for tasks assessed/performed   PT - Cognitive impairments: No apparent impairments                         Following commands: Intact       Cueing Cueing Techniques: Verbal cues     General Comments      Exercises     Assessment/Plan    PT Assessment Patient needs continued PT services  PT Problem List Decreased strength;Decreased activity tolerance;Decreased mobility       PT Treatment Interventions Gait training;Stair training;Functional mobility training;Therapeutic activities;Neuromuscular re-education;Balance training;Therapeutic exercise    PT Goals (Current goals can be found in the Care Plan section)  Acute Rehab PT Goals Patient Stated Goal: Pt wants to go back to work PT Goal Formulation: With patient Time For Goal Achievement: 10/02/24 Potential to Achieve Goals: Good    Frequency Min 2X/week     Co-evaluation               AM-PAC PT 6 Clicks Mobility  Outcome Measure Help needed turning from your back to your side while in a flat bed without using bedrails?: None Help needed moving from lying on your back to sitting on the side of a flat bed without using bedrails?: None Help needed moving to and from a bed to a chair (including a wheelchair)?: None Help needed standing up from a chair using your arms (e.g., wheelchair or bedside chair)?: None Help needed to walk in hospital room?: A Little Help needed climbing 3-5 steps with a railing? : A Little 6 Click Score: 22    End of Session   Activity Tolerance: Patient tolerated treatment well Patient left: in chair;with call bell/phone within reach;with chair alarm set Nurse Communication: Mobility status PT Visit Diagnosis: Other abnormalities of gait and mobility (R26.89);Difficulty in walking, not elsewhere classified (R26.2)    Time: 8560-8544 PT Time Calculation (min) (ACUTE ONLY): 16  min   Charges:   PT Evaluation $PT Eval Low Complexity: 1 Low   PT General Charges $$ ACUTE PT VISIT: 1 Visit         Sherlean Lesches DPT, PT    Kycen Spalla A Trinh Sanjose 08/28/2024, 3:24 PM

## 2024-08-29 ENCOUNTER — Other Ambulatory Visit: Payer: Self-pay

## 2024-08-29 DIAGNOSIS — D500 Iron deficiency anemia secondary to blood loss (chronic): Secondary | ICD-10-CM

## 2024-08-29 LAB — CBC WITH DIFFERENTIAL/PLATELET
Abs Immature Granulocytes: 0.1 K/uL — ABNORMAL HIGH (ref 0.00–0.07)
Basophils Absolute: 0.1 K/uL (ref 0.0–0.1)
Basophils Relative: 1 %
Eosinophils Absolute: 0.3 K/uL (ref 0.0–0.5)
Eosinophils Relative: 5 %
HCT: 25.8 % — ABNORMAL LOW (ref 36.0–46.0)
Hemoglobin: 8.5 g/dL — ABNORMAL LOW (ref 12.0–15.0)
Immature Granulocytes: 2 %
Lymphocytes Relative: 14 %
Lymphs Abs: 0.9 K/uL (ref 0.7–4.0)
MCH: 29.1 pg (ref 26.0–34.0)
MCHC: 32.9 g/dL (ref 30.0–36.0)
MCV: 88.4 fL (ref 80.0–100.0)
Monocytes Absolute: 0.6 K/uL (ref 0.1–1.0)
Monocytes Relative: 9 %
Neutro Abs: 4.6 K/uL (ref 1.7–7.7)
Neutrophils Relative %: 69 %
Platelets: 267 K/uL (ref 150–400)
RBC: 2.92 MIL/uL — ABNORMAL LOW (ref 3.87–5.11)
RDW: 15.4 % (ref 11.5–15.5)
WBC: 6.6 K/uL (ref 4.0–10.5)
nRBC: 0 % (ref 0.0–0.2)

## 2024-08-29 LAB — GLUCOSE, CAPILLARY: Glucose-Capillary: 263 mg/dL — ABNORMAL HIGH (ref 70–99)

## 2024-08-29 LAB — BASIC METABOLIC PANEL WITH GFR
Anion gap: 12 (ref 5–15)
BUN: 39 mg/dL — ABNORMAL HIGH (ref 8–23)
CO2: 24 mmol/L (ref 22–32)
Calcium: 9.7 mg/dL (ref 8.9–10.3)
Chloride: 96 mmol/L — ABNORMAL LOW (ref 98–111)
Creatinine, Ser: 1.93 mg/dL — ABNORMAL HIGH (ref 0.44–1.00)
GFR, Estimated: 24 mL/min — ABNORMAL LOW
Glucose, Bld: 201 mg/dL — ABNORMAL HIGH (ref 70–99)
Potassium: 4 mmol/L (ref 3.5–5.1)
Sodium: 131 mmol/L — ABNORMAL LOW (ref 135–145)

## 2024-08-29 LAB — TYPE AND SCREEN
ABO/RH(D): A POS
Antibody Screen: NEGATIVE
Unit division: 0

## 2024-08-29 LAB — BPAM RBC
Blood Product Expiration Date: 202601302359
Unit Type and Rh: 6200

## 2024-08-29 LAB — PROTIME-INR
INR: 1.1 (ref 0.8–1.2)
Prothrombin Time: 14.3 s (ref 11.4–15.2)

## 2024-08-29 MED ORDER — CEPHALEXIN 250 MG PO CAPS
250.0000 mg | ORAL_CAPSULE | Freq: Two times a day (BID) | ORAL | 0 refills | Status: AC
  Filled 2024-08-29: qty 10, 5d supply, fill #0

## 2024-08-29 NOTE — Care Management Obs Status (Signed)
 MEDICARE OBSERVATION STATUS NOTIFICATION   Patient Details  Name: Lisa Mcneil MRN: [id number] Date of Birth: [birth year]   Medicare Observation Status Notification Given:  Chaney BRANDY CHRISTIANE LELON, CMA 08/29/2024, 3:50 PM

## 2024-08-29 NOTE — Plan of Care (Signed)
  Problem: Clinical Measurements: Goal: Ability to maintain clinical measurements within normal limits will improve Outcome: Progressing Goal: Will remain free from infection Outcome: Progressing Goal: Diagnostic test results will improve Outcome: Progressing Goal: Respiratory complications will improve Outcome: Progressing Goal: Cardiovascular complication will be avoided Outcome: Progressing   Problem: Activity: Goal: Risk for activity intolerance will decrease Outcome: Progressing   Problem: Pain Managment: Goal: General experience of comfort will improve and/or be controlled Outcome: Progressing

## 2024-08-29 NOTE — TOC Progression Note (Signed)
 Transition of Care [facility]) - Progression Note    Patient Details  Name: Deneice Wack MRN: [id number] Date of Birth: Jun 19, 1928  Transition of [facility] CM/SW Contact  Alfonso Rummer, LCSW Phone Number: 08/29/2024, 5:11 PM  Clinical Narrative:      LCSW A. Rummer spk with pt son regarding caregiving options. Pt son provided with information for assisted living, [facility] [place] and a place for mom.                    Expected Discharge Plan and Services         Expected Discharge Date: 08/29/24                                     Social Drivers of Health (SDOH) Interventions SDOH Screenings   Food Insecurity: No Food Insecurity (08/28/2024)  Housing: Low Risk (08/28/2024)  Transportation Needs: No Transportation Needs (08/28/2024)  Utilities: Not At Risk (08/28/2024)  Financial Resource Strain: Low Risk  (05/27/2023)   Received from [facility]  Social Connections: Moderately Isolated (08/28/2024)  Tobacco Use: Medium Risk (08/28/2024)    Readmission Risk Interventions    03/23/2023    4:17 PM 09/15/2022    1:07 PM  Readmission Risk Prevention Plan  Transportation Screening Complete Complete  PCP or Specialist Appt within 3-5 Days Complete Complete  HRI or Home Care Consult Complete   Social Work Consult for Recovery Care Planning/Counseling Not Complete Complete  Palliative Care Screening Not Applicable Not Applicable  Medication Review Oceanographer) Complete Complete

## 2024-08-29 NOTE — Discharge Summary (Signed)
 " Physician Discharge Summary   [name] MRN: [id number] DOB: 01-Apr-1928  Admit date:     08/27/2024  Discharge date: 08/29/24  Discharge Physician: Drue ONEIDA Potter   PCP: Alla Amis, MD   Recommendations at discharge:  Follow-up with PCP  Discharge Diagnoses: Principal Problem:   Blood loss anemia Active Problems:   Chronic heart failure with preserved ejection fraction (HFpEF) (HCC)   CKD (chronic kidney disease) stage 4, GFR 15-29 ml/min (HCC)  Resolved Problems:   * No resolved hospital problems. [facility] Course:  From HPI Lisa Mcneil is a 89 y.o. 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  There was an unsuccessful attempt to set up an outpatient transfusion.  Patient then came to the ED for treatment   Patient accompanied by grandson and grandson's wife.  Patient lives alone and is very independent at baseline.  Does not use a walker or cane to ambulate.  Patient's main complaint is that she is feeling cold.  Denies weakness, falls.  Reports melanotic stools.  Denies vomiting..   Reports feeling cold, which is likely secondary to anemia. Denies increased weakness or falls. Lives alone and is independent with activities of daily living and medication management. Reports dark, purplish stools. Denies vomiting.       Assessment and Plan: Acute blood loss anemia secondary to GI bleed type unknown Patient with multiple weeks of melanotic stool.  Hemoglobin 6.9 in the ED.  PPI therapy Case discussed with gastroenterologist Patient pending endoscopic intervention Status post 1 unit of blood transfusion Hemoglobin have remained stable Patient was cleared by cardiologist for endoscopic intervention however upon discussion with GI patient did not want to undergo  procedure Patient is therefore cleared for discharge today and will follow-up with PCP   Urinary tract infection Continue antibiotics   Chronic heart failure with preserved ejection fraction (HFpEF) (HCC) Monitor input and output as well as daily weight Continue home dose of carvedilol  and Lasix    CKD (chronic kidney disease) stage 4, GFR 15-29 ml/min (HCC) Appears to be at baseline.   Monitor renal function closely   Chronic stable conditions Gout, CHF, CAD, insomnia,    Consultants: GI, cardio Procedures performed: None Disposition: Home Diet recommendation:  Cardiac diet DISCHARGE MEDICATION: Allergies as of 08/29/2024       Reactions   Benadryl  [diphenhydramine ] Other (See Comments)   Severe restless legs   Atorvastatin Other (See Comments)   Codeine    Demerol [meperidine]    Naproxen Rash   Caused rash on mouth        Medication List     STOP taking these medications    neomycin -polymyxin b-dexamethasone  3.5-10000-0.1 Susp Commonly known as: MAXITROL    nitrofurantoin (macrocrystal-monohydrate) 100 MG capsule Commonly known as: MACROBID   nitroGLYCERIN  0.4 MG SL tablet Commonly known as: NITROSTAT    Robafen DM 20-200 MG/20ML Liqd Generic drug: Dextromethorphan -guaiFENesin        TAKE these medications    pramipexole  0.25 MG tablet Commonly known as: MIRAPEX  Take 0.25 mg by mouth in the morning and at bedtime. The timing of this medication is very important.   allopurinol  100 MG tablet Commonly known as: Zyloprim  Take 0.5 tablets (50 mg total) by mouth daily.   aspirin  EC 81 MG tablet Take 1 tablet (81  mg total) by mouth daily. Swallow whole.   carvedilol  3.125 MG tablet Commonly known as: COREG  Take 1 tablet (3.125 mg total) by mouth 2 (two) times daily with a meal.   cephALEXin  250 MG capsule Commonly known as: KEFLEX  Take 1 capsule (250 mg total) by mouth 2 (two) times daily for 5 days.   empagliflozin  10 MG Tabs tablet Commonly  known as: JARDIANCE  Take 10 mg by mouth daily.   ferrous sulfate  325 (65 FE) MG tablet Take 325 mg by mouth daily.   furosemide  40 MG tablet Commonly known as: Lasix  Take 1 tablet (40 mg total) by mouth 2 (two) times daily.   gabapentin  100 MG capsule Commonly known as: NEURONTIN  Take 100 mg by mouth at bedtime.   isosorbide  mononitrate 60 MG 24 hr tablet Commonly known as: IMDUR  Take by mouth daily.   Melatonin 10 MG Tabs Take 20 mg by mouth at bedtime.   multivitamin with minerals tablet Take 1 tablet by mouth daily.   omeprazole 20 MG capsule Commonly known as: PRILOSEC Take 20 mg by mouth daily.   rosuvastatin  5 MG tablet Commonly known as: CRESTOR  Take 5 mg by mouth at bedtime.   traZODone  50 MG tablet Commonly known as: DESYREL  Take 50 mg by mouth at bedtime.        Discharge Exam: Filed Weights   08/27/24 1751  Weight: 53.5 kg   General: Alert, oriented HEENT: PERRLA, EOMI, conjunctival pallor CV: Regular rate and rhythm no murmurs Pulmonary: Lungs clear bilaterally GI: Soft, nontender.  Normal bowel sounds MSK: Strength equal bilaterally, Neuro: Cranial nerves II through XII grossly intact.   Extremities: No pedal edema, 2+ DP pulses bilaterally    Condition at discharge: good  The results of significant diagnostics from this hospitalization (including imaging, microbiology, ancillary and laboratory) are listed below for reference.   Imaging Studies: No results found.  Microbiology: Results for orders placed or performed during the hospital encounter of 10/08/23  Resp panel by RT-PCR (RSV, Flu A&B, Covid) Anterior Nasal Swab     Status: Abnormal   Collection Time: 10/08/23 11:23 AM   Specimen: Anterior Nasal Swab  Result Value Ref Range Status   SARS Coronavirus 2 by RT PCR NEGATIVE NEGATIVE Final    Comment: (NOTE) SARS-CoV-2 target nucleic acids are NOT DETECTED.  The SARS-CoV-2 RNA is generally detectable in upper  respiratory specimens during the acute phase of infection. The lowest concentration of SARS-CoV-2 viral copies this assay can detect is 138 copies/mL. A negative result does not preclude SARS-Cov-2 infection and should not be used as the sole basis for treatment or other patient management decisions. A negative result may occur with  improper specimen collection/handling, submission of specimen other than nasopharyngeal swab, presence of viral mutation(s) within the areas targeted by this assay, and inadequate number of viral copies(<138 copies/mL). A negative result must be combined with clinical observations, patient history, and epidemiological information. The expected result is Negative.  Fact Sheet for Patients:  bloggercourse.com  Fact Sheet for Healthcare Providers:  seriousbroker.it  This test is no t yet approved or cleared by the [place]  FDA and  has been authorized for detection and/or diagnosis of SARS-CoV-2 by FDA under an Emergency Use Authorization (EUA). This EUA will remain  in effect (meaning this test can be used) for the duration of the COVID-19 declaration under Section 564(b)(1) of the Act, 21 U.S.C.section 360bbb-3(b)(1), unless the authorization is terminated  or revoked sooner.  Influenza A by PCR POSITIVE (A) NEGATIVE Final   Influenza B by PCR NEGATIVE NEGATIVE Final    Comment: (NOTE) The Xpert Xpress SARS-CoV-2/FLU/RSV plus assay is intended as an aid in the diagnosis of influenza from Nasopharyngeal swab specimens and should not be used as a sole basis for treatment. Nasal washings and aspirates are unacceptable for Xpert Xpress SARS-CoV-2/FLU/RSV testing.  Fact Sheet for Patients: bloggercourse.com  Fact Sheet for Healthcare Providers: seriousbroker.it  This test is not yet approved or cleared by the [place]  FDA and has been  authorized for detection and/or diagnosis of SARS-CoV-2 by FDA under an Emergency Use Authorization (EUA). This EUA will remain in effect (meaning this test can be used) for the duration of the COVID-19 declaration under Section 564(b)(1) of the Act, 21 U.S.C. section 360bbb-3(b)(1), unless the authorization is terminated or revoked.     Resp Syncytial Virus by PCR NEGATIVE NEGATIVE Final    Comment: (NOTE) Fact Sheet for Patients: bloggercourse.com  Fact Sheet for Healthcare Providers: seriousbroker.it  This test is not yet approved or cleared by the [place]  FDA and has been authorized for detection and/or diagnosis of SARS-CoV-2 by FDA under an Emergency Use Authorization (EUA). This EUA will remain in effect (meaning this test can be used) for the duration of the COVID-19 declaration under Section 564(b)(1) of the Act, 21 U.S.C. section 360bbb-3(b)(1), unless the authorization is terminated or revoked.  Performed at [facility], [address], [place], [place] [address]   Urine Culture     Status: Abnormal   Collection Time: 10/08/23 11:43 AM   Specimen: Urine, Random  Result Value Ref Range Status   Specimen Description   Final    URINE, RANDOM Performed at [facility], [address] [place], [place] [address]    Special Requests   Final    NONE Performed at [facility], [address] [place], [place] [address]    Culture >=100,000 COLONIES/mL CITROBACTER AMALONATICUS (A)  Final   Report Status 10/10/2023 FINAL  Final   Organism ID, Bacteria CITROBACTER AMALONATICUS (A)  Final      Susceptibility   Citrobacter amalonaticus - MIC*    CEFEPIME <=0.12 SENSITIVE Sensitive     CEFTRIAXONE  <=0.25 SENSITIVE Sensitive     CIPROFLOXACIN <=0.25 SENSITIVE Sensitive     GENTAMICIN <=1 SENSITIVE Sensitive     IMIPENEM <=0.25 SENSITIVE Sensitive     NITROFURANTOIN 64 INTERMEDIATE  Intermediate     TRIMETH/SULFA >=320 RESISTANT Resistant     PIP/TAZO <=4 SENSITIVE Sensitive ug/mL    * >=100,000 COLONIES/mL CITROBACTER AMALONATICUS    Labs: CBC: Recent Labs  Lab 08/25/24 1435 08/27/24 1753 08/28/24 0337 08/29/24 0520  WBC 8.8 6.2 7.3 6.6  NEUTROABS 7.3 4.6  --  4.6  HGB 7.1* 6.9* 8.3* 8.5*  HCT 21.8* 21.0* 24.8* 25.8*  MCV 92.0 91.3 87.6 88.4  PLT 264 315 265 267   Basic Metabolic Panel: Recent Labs  Lab 08/27/24 1753 08/28/24 0337 08/29/24 0520  NA 127* 130* 131*  K 4.6 3.8 4.0  CL 90* 95* 96*  CO2 22 24 24   GLUCOSE 274* 191* 201*  BUN 50* 46* 39*  CREATININE 1.91* 1.84* 1.93*  CALCIUM  9.4 9.5 9.7   Liver Function Tests: Recent Labs  Lab 08/27/24 1753 08/28/24 0337  AST 18 15  ALT 11 8  ALKPHOS 82 76  BILITOT 0.3 0.5  PROT 7.0 6.6  ALBUMIN  3.8 3.5   CBG: Recent Labs  Lab 08/29/24 1215  GLUCAP 263*    Discharge time spent:  37 minutes.  Signed: Drue ONEIDA Potter, MD [facility] 08/29/2024 "

## 2024-08-29 NOTE — Progress Notes (Signed)
 Mobility Specialist Progress Note:    08/29/24 0943  Mobility  Activity Ambulated with assistance  Level of Assistance Contact guard assist, steadying assist  Assistive Device Front wheel walker  Distance Ambulated (ft) 200 ft  Range of Motion/Exercises Active;All extremities  Activity Response Tolerated well  Mobility visit 1 Mobility  Mobility Specialist Start Time (ACUTE ONLY) [id number]  Mobility Specialist Stop Time (ACUTE ONLY) 0931  Mobility Specialist Time Calculation (min) (ACUTE ONLY) 22 min   Pt received in bed, agreeable to mobility. Required CGA to stand and ambulate with RW. Tolerated well, asx throughout. Returned pt supine, alarm on and all needs met.  Sherrilee Ditty Mobility Specialist Please contact via Special Educational Needs Teacher or  Rehab office at [phone]

## 2024-08-29 NOTE — TOC CM/SW Note (Signed)
 Transition of Care [facility]) - Inpatient Brief Assessment   Patient Details  Name: Lisa Mcneil MRN: [id number] Date of Birth: [birth year]  Transition of Care [facility]) CM/SW Contact:    Nathanael CHRISTELLA Ring, RN Phone Number: 08/29/2024, 11:12 AM   Clinical Narrative: CM met with patient at the bedside, she is sitting up on the side of the bed, she has family sitting with her.  Reviewed recommendation for OP PT.  She declines, she is independent at home, lives alone in an apartment, still drives, she also still works 2 days per week at the [employer].   No TOC needs.    Transition of Care Asessment: Insurance and Status: Insurance coverage has been reviewed Patient has primary care physician: Yes Home environment has been reviewed: from home alone Prior level of function:: independent Prior/Current Home Services: No current home services Social Drivers of Health Review: SDOH reviewed no interventions necessary Readmission risk has been reviewed: No (under obs) Transition of care needs: no transition of care needs at this time

## 2024-08-29 NOTE — Inpatient Diabetes Management (Signed)
 Inpatient Diabetes Program Recommendations  AACE/ADA: New Consensus Statement on Inpatient Glycemic Control   Target Ranges:  Prepandial:   less than 140 mg/dL      Peak postprandial:   less than 180 mg/dL (1-2 hours)      Critically ill patients:  140 - 180 mg/dL     Latest Reference Range & Units 08/27/24 17:53 08/28/24 03:37 08/29/24 05:20  Glucose 70 - 99 mg/dL 725 (H) 808 (H) 798 (H)   Review of Glycemic Control  Diabetes history: DM2 Outpatient Diabetes medications: Jardiance  10 mg daily Current orders for Inpatient glycemic control: Jardiance  10 mg daily  Inpatient Diabetes Program Recommendations:    Insulin : While inpatient, may want to consider ordering CBGs AC&HS and Novolog  0-9 units TID with meals.  Thanks, Earnie Gainer, RN, MSN, CDCES Diabetes Coordinator Inpatient Diabetes Program [phone] (Team Pager from 8am to 5pm)

## 2024-08-29 NOTE — Consult Note (Signed)
 "  CARDIOLOGY CONSULT NOTE               Patient ID: Lisa Mcneil MRN: [id number] DOB/AGE: 05/06/1928 89 y.o.  Admit date: 08/27/2024 Referring Physician Dr. Drue Potter hospitalist Primary Physician Dr. Alla primary Primary Cardiologist [facility] Reason for Consultation preop assessment for colonoscopy history of aortic stenosis  HPI: 89 year old female history of recurrent GI bleeding presumed from AVMs has a history of severe aortic stenosis but has declined invasive intervention and treatment in the past currently just on aspirin  for anticoagulation history of diabetes chronic renal sufficiency hypertension HFpEF complaining of recent profound fatigue and weakness and was found to have a low hemoglobin.  Patient was scheduled for outpatient transfusion but it was never done patient then presented to the emergency room with generalized weakness fatigue and found to be profoundly anemic requiring transfusion patient states she was fine before her blood count got low she did not notice any blood in her stools or in her urine she has not coughed up any blood either no dark stools.  Currently feels reasonably well at rest no lightheaded dizziness blackout spells or syncope chest pain shortness of breath occurred with anemia  Review of systems complete and found to be negative unless listed above     Past Medical History:  Diagnosis Date   CHF (congestive heart failure) (HCC)    Diabetes mellitus without complication (HCC)    GERD (gastroesophageal reflux disease)    Hypertension    Insomnia    Iron  deficiency anemia    RLS (restless legs syndrome)     Past Surgical History:  Procedure Laterality Date   BLADDER REPAIR     CATARACT EXTRACTION W/PHACO Right 05/13/2016   Procedure: CATARACT EXTRACTION PHACO AND INTRAOCULAR LENS PLACEMENT (IOC);  Surgeon: Elsie Carmine, MD;  Location: [facility] ORS;  Service: Ophthalmology;  Laterality: Right;  US  1.09 AP% 21.0 CDE  14.60 Fluid Pack Lot # [id number]   CATARACT EXTRACTION W/PHACO Left 07/15/2016   Procedure: CATARACT EXTRACTION PHACO AND INTRAOCULAR LENS PLACEMENT (IOC);  Surgeon: Elsie Carmine, MD;  Location: [facility] ORS;  Service: Ophthalmology;  Laterality: Left;  Lot# [id number] US : 01:17.9 AP%:27.8 CDE: 21.67    ESOPHAGOGASTRODUODENOSCOPY (EGD) WITH PROPOFOL  N/A 03/26/2023   Procedure: ESOPHAGOGASTRODUODENOSCOPY (EGD) WITH PROPOFOL ;  Surgeon: Jinny Carmine, MD;  Location: [facility] ENDOSCOPY;  Service: Endoscopy;  Laterality: N/A;   HEMOSTASIS CLIP PLACEMENT  03/26/2023   Procedure: HEMOSTASIS CLIP PLACEMENT;  Surgeon: Jinny Carmine, MD;  Location: [facility] ENDOSCOPY;  Service: Endoscopy;;   HEMOSTASIS CONTROL  03/26/2023   Procedure: HEMOSTASIS CONTROL;  Surgeon: Jinny Carmine, MD;  Location: [facility] ENDOSCOPY;  Service: Endoscopy;;   HOT HEMOSTASIS  03/26/2023   Procedure: HOT HEMOSTASIS (ARGON PLASMA COAGULATION/BICAP);  Surgeon: Jinny Carmine, MD;  Location: [facility] ENDOSCOPY;  Service: Endoscopy;;    Medications Prior to Admission  Medication Sig Dispense Refill Last Dose/Taking   allopurinol  (ZYLOPRIM ) 100 MG tablet Take 0.5 tablets (50 mg total) by mouth daily. 15 tablet 0 08/26/2024   aspirin  EC 81 MG tablet Take 1 tablet (81 mg total) by mouth daily. Swallow whole. 30 tablet 12 08/26/2024   carvedilol  (COREG ) 3.125 MG tablet Take 1 tablet (3.125 mg total) by mouth 2 (two) times daily with a meal. 60 tablet 0 08/26/2024   empagliflozin  (JARDIANCE ) 10 MG TABS tablet Take 10 mg by mouth daily.   08/26/2024   ferrous sulfate  325 (65 FE) MG tablet Take 325 mg by mouth daily.   08/26/2024   gabapentin  (NEURONTIN )  100 MG capsule Take 100 mg by mouth at bedtime.   08/26/2024   Melatonin 10 MG TABS Take 20 mg by mouth at bedtime.   Unknown   Multiple Vitamins-Minerals (MULTIVITAMIN WITH MINERALS) tablet Take 1 tablet by mouth daily.   08/26/2024   nitrofurantoin, macrocrystal-monohydrate, (MACROBID) 100 MG capsule Take 100 mg by  mouth 2 (two) times daily.   08/26/2024   omeprazole (PRILOSEC) 20 MG capsule Take 20 mg by mouth daily.   08/26/2024   rosuvastatin  (CRESTOR ) 5 MG tablet Take 5 mg by mouth at bedtime.   08/26/2024   traZODone  (DESYREL ) 50 MG tablet Take 50 mg by mouth at bedtime.   08/26/2024   Dextromethorphan -guaiFENesin  20-200 MG/20ML LIQD Take 10 mLs by mouth every 4 (four) hours as needed. (Patient not taking: Reported on 11/24/2023) 118 mL 0 Not Taking   furosemide  (LASIX ) 40 MG tablet Take 1 tablet (40 mg total) by mouth 2 (two) times daily. 60 tablet 0    isosorbide  mononitrate (IMDUR ) 60 MG 24 hr tablet Take by mouth daily. (Patient not taking: Reported on 08/27/2024)   Not Taking   neomycin -polymyxin b-dexamethasone  (MAXITROL ) 3.5-10000-0.1 SUSP Place 1 drop into the right eye 3 (three) times daily. (Patient not taking: Reported on 10/09/2023)      nitroGLYCERIN  (NITROSTAT ) 0.4 MG SL tablet Place 1 tablet (0.4 mg total) under the tongue every 5 (five) minutes as needed for chest pain. (Patient not taking: Reported on 10/09/2023) 20 tablet 12    pramipexole  (MIRAPEX ) 0.25 MG tablet Take 0.25 mg by mouth in the morning and at bedtime. (Patient not taking: Reported on 08/27/2024)   Not Taking   Social History   Socioeconomic History   Marital status: Single    Spouse name: Not on file   Number of children: Not on file   Years of education: Not on file   Highest education level: Not on file  Occupational History   Not on file  Tobacco Use   Smoking status: Former    Types: Cigarettes   Smokeless tobacco: Never  Vaping Use   Vaping status: Never Used  Substance and Sexual Activity   Alcohol use: No   Drug use: No   Sexual activity: Not Currently  Other Topics Concern   Not on file  Social History Narrative   Not on file   Social Drivers of Health   Tobacco Use: Medium Risk (08/28/2024)   Patient History    Smoking Tobacco Use: Former    Smokeless Tobacco Use: Never    Passive Exposure: Not on  file  Financial Resource Strain: Low Risk  (05/27/2023)   Received from [facility]   Overall Financial Resource Strain (CARDIA)    Difficulty of Paying Living Expenses: Not hard at all  Food Insecurity: No Food Insecurity (08/28/2024)   Epic    Worried About Radiation Protection Practitioner of Food in the Last Year: Never true    Ran Out of Food in the Last Year: Never true  Transportation Needs: No Transportation Needs (08/28/2024)   Epic    Lack of Transportation (Medical): No    Lack of Transportation (Non-Medical): No  Physical Activity: Not on file  Stress: Not on file  Social Connections: Moderately Isolated (08/28/2024)   Social Connection and Isolation Panel    Frequency of Communication with Friends and Family: Three times a week    Frequency of Social Gatherings with Friends and Family: Twice a week    Attends Religious Services: 1  to 4 times per year    Active Member of Clubs or Organizations: No    Attends Banker Meetings: Never    Marital Status: Widowed  Intimate Partner Violence: Not At Risk (08/28/2024)   Epic    Fear of Current or Ex-Partner: No    Emotionally Abused: No    Physically Abused: No    Sexually Abused: No  Depression (PHQ2-9): Not on file  Alcohol Screen: Not on file  Housing: Low Risk (08/28/2024)   Epic    Unable to Pay for Housing in the Last Year: No    Number of Times Moved in the Last Year: 0    Homeless in the Last Year: No  Utilities: Not At Risk (08/28/2024)   Epic    Threatened with loss of utilities: No  Health Literacy: Not on file    Family History  Problem Relation Age of Onset   Hypertension Father    Heart disease Father       Review of systems complete and found to be negative unless listed above      PHYSICAL EXAM  General: Well developed, well nourished, in no acute distress HEENT:  Normocephalic and atramatic Neck:  No JVD.  Lungs: Clear bilaterally to auscultation and percussion. Heart: HRRR . Normal  S1 and S2 without gallops or 3/6 sem murmurs.  Abdomen: Bowel sounds are positive, abdomen soft and non-tender  Msk:  Back normal, normal gait. Normal strength and tone for age. Extremities: No clubbing, cyanosis or edema.   Neuro: Alert and oriented X 3. Psych:  Good affect, responds appropriately  Labs:   Lab Results  Component Value Date   WBC 6.6 08/29/2024   HGB 8.5 (L) 08/29/2024   HCT 25.8 (L) 08/29/2024   MCV 88.4 08/29/2024   PLT 267 08/29/2024    Recent Labs  Lab 08/28/24 0337 08/29/24 0520  NA 130* 131*  K 3.8 4.0  CL 95* 96*  CO2 24 24  BUN 46* 39*  CREATININE 1.84* 1.93*  CALCIUM  9.5 9.7  PROT 6.6  --   BILITOT 0.5  --   ALKPHOS 76  --   ALT 8  --   AST 15  --   GLUCOSE 191* 201*   No results found for: CKTOTAL, CKMB, CKMBINDEX, TROPONINI  Lab Results  Component Value Date   CHOL 112 06/27/2022   CHOL 101 06/14/2022   Lab Results  Component Value Date   HDL 38 (L) 06/27/2022   HDL 37 (L) 06/14/2022   Lab Results  Component Value Date   LDLCALC 58 06/27/2022   LDLCALC 47 06/14/2022   Lab Results  Component Value Date   TRIG 80 06/27/2022   TRIG 86 06/14/2022   Lab Results  Component Value Date   CHOLHDL 2.9 06/27/2022   CHOLHDL 2.7 06/14/2022   No results found for: LDLDIRECT    Radiology: No results found.  EKG: Normal sinus rhythm rate of 65 LVH nonspecific ST-T wave changes  ASSESSMENT AND PLAN:  Preop restratification for EGD/Colonoscopy Anemia GI bleed History of severe aortic stenosis Murmur Diabetes Chronic renal sufficiency stage IV History of heart failure GERD . Plan Agree with admit transfuse hopefully to hemoglobin of at least 8 Recommend hold any anticoagulation including aspirin  for now until cleared by GI Severe aortic stenosis relatively asymptomatic when not anemic patient is refusing any invasive treatment continue conservative management Hypertension reasonably controlled continue and  hypertensive medication Chronic renal sufficiency stage IV follow-up with  nephrology avoid nephrotoxic drugs History of diastolic congestive heart failure HFpEF reasonably stable appears to be compensated Diabetes continue current diabetes medication Patient is a moderate risk patient for low risk procedure.  Recommend proceed with colonoscopy no interventions recommended to modify her risk  Signed: Cara JONETTA Lovelace MD 08/29/2024, 12:33 PM      "

## 2024-08-29 NOTE — Progress Notes (Signed)
"  °  °  Rogelia Copping, MD [facility]   [address] [place], [place] [address] Phone: [phone] Fax : [phone]   Subjective: This patient was evaluated by cardiology for her extensive cardiac history and was deemed moderate risk for a low risk procedure.  I came to speak to the patient about being put to sleep and having a normal evaluation versus capsule endoscopy for investigation of her anemia.  She has no complaints at the present time and denies any sign of bleeding.   Objective: Vital signs in last 24 hours: Vitals:   08/28/24 1941 08/29/24 0317 08/29/24 0805 08/29/24 0920  BP: (!) 139/48 (!) 142/47 (!) 130/46 134/62  Pulse: 88 75 74 72  Resp: 18 17 17    Temp: 97.9 F (36.6 C) (!) 97.5 F (36.4 C) 98.4 F (36.9 C)   TempSrc: Oral     SpO2:  92% 94%   Weight:      Height:       Weight change:   Intake/Output Summary (Last 24 hours) at 08/29/2024 1435 Last data filed at 08/29/2024 1346 Gross per 24 hour  Intake 800 ml  Output --  Net 800 ml     Exam: Heart:: Regular rate and rhythm or without murmur or extra heart sounds Lungs: normal and clear to auscultation and percussion Abdomen: soft, nontender, normal bowel sounds   Lab Results: @LABTEST2 @ Micro Results: No results found for this or any previous visit (from the past 240 hours). Studies/Results: No results found. Medications: I have reviewed the patient's current medications. Scheduled Meds:  carvedilol   3.125 mg Oral BID WC   empagliflozin   10 mg Oral Daily   furosemide   40 mg Oral BID   gabapentin   100 mg Oral QHS   melatonin  20 mg Oral QHS   pantoprazole  (PROTONIX ) IV  40 mg Intravenous Q12H   Continuous Infusions:  cefTRIAXone  (ROCEPHIN )  IV Stopped (08/28/24 2024)   PRN Meds:.acetaminophen  **OR** acetaminophen , ondansetron  **OR** ondansetron  (ZOFRAN ) IV, mouth rinse, oxyCODONE , polyethylene glycol   Assessment: Principal Problem:   Blood loss anemia Active Problems:   Chronic  heart failure with preserved ejection fraction (HFpEF) (HCC)   CKD (chronic kidney disease) stage 4, GFR 15-29 ml/min (HCC)    Plan: The patient was admitted with severe aortic stenosis and multiple cardiac issues with anemia.  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  The patient states that she refuses to undergo any luminal evaluation and just wants to go home.  The patient was offered capsule endoscopy for evaluation of the small bowel for possible AVMs that have been found in the past and the patient again refuses and states that she just wants to go home.  I have informed the hospitalist of her wishes.   LOS: 0 days   Rogelia Copping, MD.FACG 08/29/2024, 2:35 PM Pager [phone] 7am-5pm  Check AMION for 5pm -7am coverage and on weekends  "

## 2024-09-09 ENCOUNTER — Telehealth: Payer: Self-pay | Admitting: Oncology

## 2024-09-09 ENCOUNTER — Encounter: Payer: Self-pay | Admitting: Oncology

## 2024-09-09 NOTE — Telephone Encounter (Signed)
 Pt son called to make the pt a hospital FU appt with Dr Georgina. Please advise on scheduling.

## 2024-09-11 ENCOUNTER — Telehealth: Payer: Self-pay | Admitting: Nurse Practitioner

## 2024-09-11 NOTE — Telephone Encounter (Signed)
 Due to weather, clinic on delayed start time 2/3. I called and lvm that pt appt time was adjusted to 10am (previous time was 9:30am) scheduling phone number provided

## 2024-09-13 ENCOUNTER — Inpatient Hospital Stay

## 2024-09-13 ENCOUNTER — Other Ambulatory Visit

## 2024-09-13 ENCOUNTER — Inpatient Hospital Stay: Admitting: Nurse Practitioner

## 2024-09-13 ENCOUNTER — Encounter: Payer: Self-pay | Admitting: Nurse Practitioner

## 2024-09-13 VITALS — BP 105/32 | HR 62

## 2024-09-13 VITALS — BP 127/38 | HR 70 | Temp 98.6°F | Resp 18 | Wt 115.2 lb

## 2024-09-13 DIAGNOSIS — N183 Chronic kidney disease, stage 3 unspecified: Secondary | ICD-10-CM

## 2024-09-13 DIAGNOSIS — K264 Chronic or unspecified duodenal ulcer with hemorrhage: Secondary | ICD-10-CM

## 2024-09-13 DIAGNOSIS — D500 Iron deficiency anemia secondary to blood loss (chronic): Secondary | ICD-10-CM

## 2024-09-13 MED ORDER — IRON SUCROSE 20 MG/ML IV SOLN
200.0000 mg | Freq: Once | INTRAVENOUS | Status: AC
  Administered 2024-09-13: 200 mg via INTRAVENOUS
  Filled 2024-09-13: qty 10

## 2024-09-13 MED ORDER — SODIUM CHLORIDE 0.9 % IV SOLN
200.0000 mg | Freq: Once | INTRAVENOUS | Status: DC
  Filled 2024-09-13: qty 10

## 2024-09-13 NOTE — Patient Instructions (Signed)
 Iron  Sucrose Injection What is this medication? IRON  SUCROSE (EYE ern SOO krose) treats low levels of iron  (iron  deficiency anemia) in people with kidney disease. Iron  is a mineral that plays an important role in making red blood cells, which carry oxygen from your lungs to the rest of your body. This medicine may be used for other purposes; ask your health care provider or pharmacist if you have questions. COMMON BRAND NAME(S): Venofer  What should I tell my care team before I take this medication? They need to know if you have any of these conditions: Anemia not caused by low iron  levels Heart disease High levels of iron  in the blood Kidney disease Liver disease An unusual or allergic reaction to iron , other medications, foods, dyes, or preservatives Pregnant or trying to get pregnant Breastfeeding How should I use this medication? This medication is infused into a vein. It is given by your care team in a hospital or clinic setting. Talk to your care team about the use of this medication in children. While it may be prescribed for children as young as 2 years for selected conditions, precautions do apply. Overdosage: If you think you have taken too much of this medicine contact a poison control center or emergency room at once. NOTE: This medicine is only for you. Do not share this medicine with others. What if I miss a dose? Keep appointments for follow-up doses. It is important not to miss your dose. Call your care team if you are unable to keep an appointment. What may interact with this medication? Do not take this medication with any of the following: Deferoxamine Dimercaprol Other iron  products This medication may also interact with the following: Chloramphenicol Deferasirox This list may not describe all possible interactions. Give your health care provider a list of all the medicines, herbs, non-prescription drugs, or dietary supplements you use. Also tell them if you smoke,  drink alcohol, or use illegal drugs. Some items may interact with your medicine. What should I watch for while using this medication? Your condition will be monitored carefully while you are receiving this medication. Tell your care team if your symptoms do not start to get better or if they get worse. You may need blood work done while you are taking this medication. Sometimes, when medications are infused into veins, a little can leak out of the vein and into the tissue around it. If this medication leaks, it can cause a brown or dark stain on the skin. This is not common. It may be permanent. If you feel pain or swelling during your infusion, tell your care team right away. They can stop the infusion and treat the area. You may need to eat more foods that contain iron . Talk to your care team. Foods that contain iron  include whole grains or cereals, dried fruits, beans, peas, leafy green vegetables, and organ meats (liver, kidney). What side effects may I notice from receiving this medication? Side effects that you should report to your care team as soon as possible: Allergic reactions--skin rash, itching, hives, swelling of the face, lips, tongue, or throat Low blood pressure--dizziness, feeling faint or lightheaded, blurry vision Painful swelling, warmth, or redness of the skin, brown or dark skin color at the infusion site Shortness of breath Side effects that usually do not require medical attention (report these to your care team if they continue or are bothersome): Flushing Headache Joint pain Muscle pain Nausea This list may not describe all possible side effects. Call your  doctor for medical advice about side effects. You may report side effects to FDA at 1-800-FDA-1088. Where should I keep my medication? This medication is given in a hospital or clinic. It will not be stored at home. NOTE: This sheet is a summary. It may not cover all possible information. If you have questions about  this medicine, talk to your doctor, pharmacist, or health care provider.  2025 Elsevier/Gold Standard (2024-06-15 00:00:00)

## 2024-09-16 ENCOUNTER — Encounter: Payer: Self-pay | Admitting: Oncology

## 2024-09-16 NOTE — Progress Notes (Signed)
 " [facility]  Telephone:([phone] [phone]  ID: Lisa Mcneil OB: [birth year]  MR#: [id number]  RDW#:[id number]  Patient Care Team: Alla Amis, MD as PCP - General (Family Medicine) Jacobo Evalene PARAS, MD as Consulting Physician (Oncology)  CHIEF COMPLAINT:  Iron  deficiency anemia and anemia of chronic kidney disease .  INTERVAL HISTORY: Patient returns to clinic today for repeat laboratory work, further evaluation, and consideration of iron  infusion as well as hospital follow up.  She was admitted to the hospital on 08/27/24 for symptomatic anemia secondary to GI bleed.  She had reported multiple weeks of melanotic stools with a Hg of 6.9.  GI was consulted PPI therapy was continued, she did get 1 unit pRBC.  After discussion about undergoing endoscopic intervention patient opted to not proceed with endoscopy.  She also has a history of CKD that could also be a contributing factor as well.  Today she is seen with her Son in no acute distress.  She endorses some fatigue, denies any shortness of breath.  She has no neurologic complaints. She denies any recent fevers or illnesses. She has a good appetite and denies weight loss.  She denies any chest pain, shortness of breath, cough, or hemoptysis.  She denies any nausea, vomiting, consultation, or diarrhea.  She continues to have dark stools, but reports she also takes iron  supplementation.  She has no urinary complaints.  Patient offers no further specific complaints today.  REVIEW OF SYSTEMS:   Review of Systems  Constitutional:  Positive for malaise/fatigue. Negative for fever and weight loss.  Respiratory: Negative.  Negative for cough and shortness of breath.   Cardiovascular: Negative.  Negative for chest pain and leg swelling.  Gastrointestinal: Negative.  Negative for abdominal pain, blood in stool and melena.  Genitourinary: Negative.  Negative for hematuria.  Musculoskeletal: Negative.   Negative for back pain.  Skin: Negative.  Negative for rash.  Neurological: Negative.  Negative for dizziness, focal weakness, weakness and headaches.  Psychiatric/Behavioral: Negative.  The patient is not nervous/anxious.     As per HPI. Otherwise, a complete review of systems is negative.  PAST MEDICAL HISTORY: Past Medical History:  Diagnosis Date   CHF (congestive heart failure) (HCC)    Diabetes mellitus without complication (HCC)    GERD (gastroesophageal reflux disease)    Hypertension    Insomnia    Iron  deficiency anemia    RLS (restless legs syndrome)     PAST SURGICAL HISTORY: Past Surgical History:  Procedure Laterality Date   BLADDER REPAIR     CATARACT EXTRACTION W/PHACO Right 05/13/2016   Procedure: CATARACT EXTRACTION PHACO AND INTRAOCULAR LENS PLACEMENT (IOC);  Surgeon: Elsie Carmine, MD;  Location: [facility] ORS;  Service: Ophthalmology;  Laterality: Right;  US  1.09 AP% 21.0 CDE 14.60 Fluid Pack Lot # [id number]   CATARACT EXTRACTION W/PHACO Left 07/15/2016   Procedure: CATARACT EXTRACTION PHACO AND INTRAOCULAR LENS PLACEMENT (IOC);  Surgeon: Elsie Carmine, MD;  Location: [facility] ORS;  Service: Ophthalmology;  Laterality: Left;  Lot# [id number] US : 01:17.9 AP%:27.8 CDE: 21.67    ESOPHAGOGASTRODUODENOSCOPY (EGD) WITH PROPOFOL  N/A 03/26/2023   Procedure: ESOPHAGOGASTRODUODENOSCOPY (EGD) WITH PROPOFOL ;  Surgeon: Jinny Carmine, MD;  Location: [facility] ENDOSCOPY;  Service: Endoscopy;  Laterality: N/A;   HEMOSTASIS CLIP PLACEMENT  03/26/2023   Procedure: HEMOSTASIS CLIP PLACEMENT;  Surgeon: Jinny Carmine, MD;  Location: [facility] ENDOSCOPY;  Service: Endoscopy;;   HEMOSTASIS CONTROL  03/26/2023   Procedure: HEMOSTASIS CONTROL;  Surgeon: Jinny Carmine, MD;  Location: [facility] ENDOSCOPY;  Service: Endoscopy;;   HOT HEMOSTASIS  03/26/2023   Procedure: HOT HEMOSTASIS (ARGON PLASMA COAGULATION/BICAP);  Surgeon: Jinny Carmine, MD;  Location: [facility] ENDOSCOPY;  Service: Endoscopy;;    FAMILY  HISTORY: Reviewed and unchanged. No reported history of malignancy or chronic disease.     ADVANCED DIRECTIVES:    HEALTH MAINTENANCE: Social History   Tobacco Use   Smoking status: Former    Types: Cigarettes   Smokeless tobacco: Never  Vaping Use   Vaping status: Never Used  Substance Use Topics   Alcohol use: No   Drug use: No     Colonoscopy:  PAP:  Bone density:  Lipid panel:  Allergies  Allergen Reactions   Benadryl  [Diphenhydramine ] Other (See Comments)    Severe restless legs   Atorvastatin Other (See Comments)   Codeine    Demerol [Meperidine]    Naproxen Rash    Caused rash on mouth    Current Outpatient Medications  Medication Sig Dispense Refill   allopurinol  (ZYLOPRIM ) 100 MG tablet Take 0.5 tablets (50 mg total) by mouth daily. 15 tablet 0   aspirin  EC 81 MG tablet Take 1 tablet (81 mg total) by mouth daily. Swallow whole. 30 tablet 12   carvedilol  (COREG ) 3.125 MG tablet Take 1 tablet (3.125 mg total) by mouth 2 (two) times daily with a meal. 60 tablet 0   donepezil (ARICEPT) 5 MG tablet Take 5 mg by mouth at bedtime.     empagliflozin  (JARDIANCE ) 10 MG TABS tablet Take 10 mg by mouth daily.     ferrous sulfate  325 (65 FE) MG tablet Take 325 mg by mouth daily.     furosemide  (LASIX ) 40 MG tablet Take 1 tablet (40 mg total) by mouth 2 (two) times daily. 60 tablet 0   gabapentin  (NEURONTIN ) 100 MG capsule Take 100 mg by mouth at bedtime.     Melatonin 10 MG TABS Take 20 mg by mouth at bedtime.     Multiple Vitamins-Minerals (MULTIVITAMIN WITH MINERALS) tablet Take 1 tablet by mouth daily.     omeprazole (PRILOSEC) 20 MG capsule Take 20 mg by mouth daily.     rosuvastatin  (CRESTOR ) 5 MG tablet Take 5 mg by mouth at bedtime.     traZODone  (DESYREL ) 50 MG tablet Take 50 mg by mouth at bedtime.     isosorbide  mononitrate (IMDUR ) 60 MG 24 hr tablet Take by mouth daily. (Patient not taking: Reported on 09/13/2024)     pramipexole  (MIRAPEX ) 0.25 MG tablet Take  0.25 mg by mouth in the morning and at bedtime. (Patient not taking: Reported on 09/13/2024)     No current facility-administered medications for this visit.   Facility-Administered Medications Ordered in Other Visits  Medication Dose Route Frequency Provider Last Rate Last Admin   0.9 %  sodium chloride  infusion   Intravenous PRN Tonette Lauraine HERO, PA-C 10 mL/hr at 03/26/23 1110 Continued from Pre-op at 03/26/23 1110    OBJECTIVE: Vitals:   09/13/24 0936  BP: (!) 127/38  Pulse: 70  Resp: 18  Temp: 98.6 F (37 C)  SpO2: 96%      Body mass index is 21.07 kg/m.    ECOG FS:0 - Asymptomatic  General: Well-developed, well-nourished, no acute distress. Eyes: Pink conjunctiva, anicteric sclera. HEENT: Normocephalic, moist mucous membranes. Lungs: No audible wheezing or coughing. Heart: Regular rate and rhythm. Abdomen: Soft, nontender, no obvious distention. Musculoskeletal: No edema, cyanosis, or clubbing. Neuro: Alert, answering all questions appropriately. Cranial nerves grossly intact. Skin: No rashes  or petechiae noted. Psych: Normal affect.  LAB RESULTS:  Lab Results  Component Value Date   NA 131 (L) 08/29/2024   K 4.0 08/29/2024   CL 96 (L) 08/29/2024   CO2 24 08/29/2024   GLUCOSE 201 (H) 08/29/2024   BUN 39 (H) 08/29/2024   CREATININE 1.93 (H) 08/29/2024   CALCIUM  9.7 08/29/2024   PROT 6.6 08/28/2024   ALBUMIN  3.5 08/28/2024   AST 15 08/28/2024   ALT 8 08/28/2024   ALKPHOS 76 08/28/2024   BILITOT 0.5 08/28/2024   GFRNONAA 24 (L) 08/29/2024   GFRAA >60 02/17/2015    Lab Results  Component Value Date   WBC 6.6 08/29/2024   NEUTROABS 4.6 08/29/2024   HGB 8.5 (L) 08/29/2024   HCT 25.8 (L) 08/29/2024   MCV 88.4 08/29/2024   PLT 267 08/29/2024   Lab Results  Component Value Date   IRON  25 (L) 08/27/2024   TIBC 347 08/27/2024   IRONPCTSAT 7 (L) 08/27/2024   Lab Results  Component Value Date   FERRITIN 37 08/27/2024   CBC w/auto Differential (5 Part)   09/09/24 Order: [id number] Component Ref Range & Units 7 d ago  WBC (White Blood Cell Count) 4.1 - 10.2 103/uL 5.3  RBC (Red Blood Cell Count) 4.04 - 5.48 106/uL 3.04 Low   Hemoglobin 12.0 - 15.0 gm/dL 8.7 Low   Hematocrit 64.9 - 47.0 % 27.9 Low   MCV (Mean Corpuscular Volume) 80.0 - 100.0 fl 91.8  MCH (Mean Corpuscular Hemoglobin) 27.0 - 31.2 pg 28.6  MCHC (Mean Corpuscular Hemoglobin Concentration) 32.0 - 36.0 gm/dL 68.7 Low   Platelet Count 150 - 450 103/uL 249  RDW-CV (Red Cell Distribution Width) 11.6 - 14.8 % 13.7  MPV (Mean Platelet Volume) 9.4 - 12.4 fl 9.1 Low   Neutrophils 1.50 - 7.80 103/uL 3.67  Lymphocytes 1.00 - 3.60 103/uL 0.83 Low   Monocytes 0.00 - 1.50 103/uL 0.43  Eosinophils 0.00 - 0.55 103/uL 0.23  Basophils 0.00 - 0.09 103/uL 0.07  Neutrophil % 32.0 - 70.0 % 69.9  Lymphocyte % 10.0 - 50.0 % 15.8  Monocyte % 4.0 - 13.0 % 8.2  Eosinophil % 1.0 - 5.0 % 4.4  Basophil% 0.0 - 2.0 % 1.3  Immature Granulocyte % <=0.7 % 0.4  Immature Granulocyte Count <=0.06 10^3/L 0.02   Component Ref Range & Units 7 d ago  Iron  28 - 170 ug/dL 29      STUDIES: No results found.   ASSESSMENT: Iron  deficiency anemia.  PLAN:    Iron  deficiency anemia: EGD on March 26, 2023 revealed an actively bleeding duodenal lesion which was cauterized at the time achieving hemostasis.  Suspect patient is still actively bleeding with recent hospitalization for GI bleed and symptomatic anemia on 08/27/24.  GI was consulted and does not wish to proceed with endoscopy at this time due to age and debility.  S/P 1 unit pRBC during hospitalization.  Hemoglobin 8.7 on 09/09/24.  Proceed with IV iron  today and weekly for a total of 5 doses.  We will have her cbc rechecked next week to closely monitor Hg trends and watch for any transfusion needs. GI bleed: Patient has chronic dark stools possibly secondary to iron  supplementation, but suspect patient still has active GI  bleed and a referral previously was sent to GI.  Recent hospitalization for GI bleed with know previous duodenal lesion.  GI was consulted last week in hospital and patient does not wish to proceed with endoscopy at this time.  Risk vs. Benefit was explained to the patient. CKD:  Patient with known history of CKD last creatinine at 1.93 could be a contributing factor of chronic anemia.  However with know GI bleed with proceed with plan to boost iron  storage first and provide supportive care to the patient.   Patient expressed understanding and was in agreement with this plan. She also understands that She can call clinic at any time with any questions, concerns, or complaints.   Follow up plan:  Proceed with IV iron  today and weekly x 5 F/U in 1 week see np/md with cbc, hold tube IV iron  D2 possible blood LP    Morna Husband, NP   09/16/2024 12:48 PM      "

## 2024-09-20 ENCOUNTER — Inpatient Hospital Stay

## 2024-09-20 ENCOUNTER — Inpatient Hospital Stay: Admitting: Nurse Practitioner

## 2024-09-21 ENCOUNTER — Inpatient Hospital Stay

## 2024-09-27 ENCOUNTER — Inpatient Hospital Stay

## 2024-10-04 ENCOUNTER — Inpatient Hospital Stay

## 2024-10-11 ENCOUNTER — Inpatient Hospital Stay

## 2024-10-18 ENCOUNTER — Inpatient Hospital Stay
